# Patient Record
Sex: Male | Born: 1960 | Race: White | Hispanic: No | Marital: Married | Smoking: Former smoker
Health system: Southern US, Community
[De-identification: ages and names within clinical notes are randomized; demographics above are authoritative.]

## PROBLEM LIST (undated history)

## (undated) DIAGNOSIS — Z22322 Carrier or suspected carrier of Methicillin resistant Staphylococcus aureus: Secondary | ICD-10-CM

## (undated) DIAGNOSIS — I10 Essential (primary) hypertension: Secondary | ICD-10-CM

## (undated) DIAGNOSIS — E781 Pure hyperglyceridemia: Secondary | ICD-10-CM

## (undated) DIAGNOSIS — E1151 Type 2 diabetes mellitus with diabetic peripheral angiopathy without gangrene: Secondary | ICD-10-CM

## (undated) DIAGNOSIS — M1990 Unspecified osteoarthritis, unspecified site: Secondary | ICD-10-CM

## (undated) DIAGNOSIS — E119 Type 2 diabetes mellitus without complications: Secondary | ICD-10-CM

## (undated) DIAGNOSIS — I219 Acute myocardial infarction, unspecified: Secondary | ICD-10-CM

## (undated) DIAGNOSIS — F32A Depression, unspecified: Secondary | ICD-10-CM

## (undated) DIAGNOSIS — F419 Anxiety disorder, unspecified: Secondary | ICD-10-CM

## (undated) DIAGNOSIS — N400 Enlarged prostate without lower urinary tract symptoms: Secondary | ICD-10-CM

## (undated) DIAGNOSIS — I209 Angina pectoris, unspecified: Secondary | ICD-10-CM

## (undated) DIAGNOSIS — Z955 Presence of coronary angioplasty implant and graft: Secondary | ICD-10-CM

## (undated) DIAGNOSIS — F329 Major depressive disorder, single episode, unspecified: Secondary | ICD-10-CM

## (undated) DIAGNOSIS — I2510 Atherosclerotic heart disease of native coronary artery without angina pectoris: Principal | ICD-10-CM

## (undated) DIAGNOSIS — J189 Pneumonia, unspecified organism: Secondary | ICD-10-CM

## (undated) DIAGNOSIS — N200 Calculus of kidney: Secondary | ICD-10-CM

## (undated) DIAGNOSIS — N5201 Erectile dysfunction due to arterial insufficiency: Secondary | ICD-10-CM

## (undated) DIAGNOSIS — I70209 Unspecified atherosclerosis of native arteries of extremities, unspecified extremity: Secondary | ICD-10-CM

## (undated) DIAGNOSIS — Z87442 Personal history of urinary calculi: Secondary | ICD-10-CM

## (undated) DIAGNOSIS — E785 Hyperlipidemia, unspecified: Secondary | ICD-10-CM

## (undated) HISTORY — DX: Hyperlipidemia, unspecified: E78.5

## (undated) HISTORY — DX: Pure hyperglyceridemia: E78.1

## (undated) HISTORY — DX: Type 2 diabetes mellitus without complications: E11.9

## (undated) HISTORY — DX: Essential (primary) hypertension: I10

## (undated) HISTORY — DX: Acute myocardial infarction, unspecified: I21.9

## (undated) HISTORY — DX: Benign prostatic hyperplasia without lower urinary tract symptoms: N40.0

## (undated) HISTORY — DX: Unspecified atherosclerosis of native arteries of extremities, unspecified extremity: I70.209

## (undated) HISTORY — DX: Type 2 diabetes mellitus with diabetic peripheral angiopathy without gangrene: E11.51

## (undated) HISTORY — DX: Major depressive disorder, single episode, unspecified: F32.9

## (undated) HISTORY — DX: Depression, unspecified: F32.A

## (undated) HISTORY — DX: Erectile dysfunction due to arterial insufficiency: N52.01

## (undated) HISTORY — DX: Carrier or suspected carrier of methicillin resistant Staphylococcus aureus: Z22.322

## (undated) HISTORY — PX: CORONARY ANGIOPLASTY WITH STENT PLACEMENT: SHX49

---

## 1995-04-24 HISTORY — PX: CORONARY ANGIOPLASTY WITH STENT PLACEMENT: SHX49

## 1999-03-29 ENCOUNTER — Inpatient Hospital Stay (HOSPITAL_COMMUNITY): Admission: EM | Admit: 1999-03-29 | Discharge: 1999-03-31 | Payer: Self-pay | Admitting: Emergency Medicine

## 2003-07-08 ENCOUNTER — Ambulatory Visit (HOSPITAL_COMMUNITY): Admission: RE | Admit: 2003-07-08 | Discharge: 2003-07-09 | Payer: Self-pay | Admitting: Cardiology

## 2004-12-18 ENCOUNTER — Ambulatory Visit: Payer: Self-pay | Admitting: Cardiology

## 2004-12-22 ENCOUNTER — Ambulatory Visit: Payer: Self-pay | Admitting: Cardiology

## 2004-12-22 ENCOUNTER — Ambulatory Visit: Payer: Self-pay

## 2006-02-05 ENCOUNTER — Ambulatory Visit: Payer: Self-pay | Admitting: Cardiology

## 2006-02-14 ENCOUNTER — Ambulatory Visit: Payer: Self-pay | Admitting: Cardiology

## 2006-02-14 ENCOUNTER — Encounter: Payer: Self-pay | Admitting: Cardiology

## 2006-02-14 ENCOUNTER — Ambulatory Visit: Payer: Self-pay

## 2006-02-25 ENCOUNTER — Ambulatory Visit: Payer: Self-pay | Admitting: Cardiology

## 2007-02-14 ENCOUNTER — Ambulatory Visit: Payer: Self-pay | Admitting: Cardiology

## 2007-02-19 ENCOUNTER — Encounter: Payer: Self-pay | Admitting: Cardiology

## 2007-02-19 ENCOUNTER — Ambulatory Visit: Payer: Self-pay

## 2007-02-19 LAB — CONVERTED CEMR LAB
ALT: 30 units/L (ref 0–53)
Alkaline Phosphatase: 51 units/L (ref 39–117)
BUN: 11 mg/dL (ref 6–23)
Bilirubin, Direct: 0.1 mg/dL (ref 0.0–0.3)
CO2: 31 meq/L (ref 19–32)
Calcium: 9.3 mg/dL (ref 8.4–10.5)
GFR calc Af Amer: 116 mL/min
GFR calc non Af Amer: 96 mL/min
HDL: 30.7 mg/dL — ABNORMAL LOW (ref >39.0)
Potassium: 4.3 meq/L (ref 3.5–5.1)
Total CHOL/HDL Ratio: 4.8
Total Protein: 6.9 g/dL (ref 6.0–8.3)
Triglycerides: 321 mg/dL (ref 0–149)

## 2008-02-09 ENCOUNTER — Ambulatory Visit: Payer: Self-pay | Admitting: Cardiology

## 2008-02-09 LAB — CONVERTED CEMR LAB
ALT: 30 units/L (ref 0–53)
CO2: 28 meq/L (ref 19–32)
Calcium: 9.5 mg/dL (ref 8.4–10.5)
Cholesterol: 135 mg/dL (ref 0–200)
Creatinine, Ser: 0.9 mg/dL (ref 0.4–1.5)
Direct LDL: 70.9 mg/dL
GFR calc Af Amer: 115 mL/min
Glucose, Bld: 256 mg/dL — ABNORMAL HIGH (ref 70–99)
HDL: 31.2 mg/dL — ABNORMAL LOW (ref >39.0)
Hgb A1c MFr Bld: 7.7 % — ABNORMAL HIGH (ref 4.6–6.0)
Sodium: 136 meq/L (ref 135–145)
Total CHOL/HDL Ratio: 4.3
Total Protein: 7 g/dL (ref 6.0–8.3)
Triglycerides: 220 mg/dL (ref 0–149)

## 2008-08-19 ENCOUNTER — Ambulatory Visit: Payer: Self-pay | Admitting: Internal Medicine

## 2008-08-19 ENCOUNTER — Encounter (INDEPENDENT_AMBULATORY_CARE_PROVIDER_SITE_OTHER): Payer: Self-pay | Admitting: *Deleted

## 2008-08-19 DIAGNOSIS — I252 Old myocardial infarction: Secondary | ICD-10-CM | POA: Insufficient documentation

## 2008-08-19 DIAGNOSIS — I10 Essential (primary) hypertension: Secondary | ICD-10-CM | POA: Insufficient documentation

## 2008-08-19 DIAGNOSIS — F418 Other specified anxiety disorders: Secondary | ICD-10-CM

## 2008-08-19 DIAGNOSIS — E1151 Type 2 diabetes mellitus with diabetic peripheral angiopathy without gangrene: Secondary | ICD-10-CM | POA: Insufficient documentation

## 2008-08-19 DIAGNOSIS — N400 Enlarged prostate without lower urinary tract symptoms: Secondary | ICD-10-CM

## 2008-08-19 DIAGNOSIS — N138 Other obstructive and reflux uropathy: Secondary | ICD-10-CM | POA: Insufficient documentation

## 2008-08-19 DIAGNOSIS — I70209 Unspecified atherosclerosis of native arteries of extremities, unspecified extremity: Secondary | ICD-10-CM

## 2008-08-19 HISTORY — DX: Type 2 diabetes mellitus with diabetic peripheral angiopathy without gangrene: E11.51

## 2008-08-19 HISTORY — DX: Essential (primary) hypertension: I10

## 2008-08-19 LAB — CONVERTED CEMR LAB
ALT: 42 units/L (ref 0–53)
Albumin: 4.2 g/dL (ref 3.5–5.2)
Basophils Relative: 0.1 % (ref 0.0–3.0)
Bilirubin, Direct: 0.1 mg/dL (ref 0.0–0.3)
CO2: 31 meq/L (ref 19–32)
Chloride: 107 meq/L (ref 96–112)
Cholesterol, target level: 200 mg/dL
Creatinine, Ser: 0.7 mg/dL (ref 0.4–1.5)
Direct LDL: 59.9 mg/dL
Eosinophils Relative: 1.6 % (ref 0.0–5.0)
HCT: 40.1 % (ref 39.0–52.0)
Hemoglobin: 14.3 g/dL (ref 13.0–17.0)
Leukocytes, UA: NEGATIVE
MCV: 86.6 fL (ref 78.0–100.0)
Microalb Creat Ratio: 3.6 mg/g (ref 0.0–30.0)
Monocytes Absolute: 0.6 10*3/uL (ref 0.1–1.0)
Neutro Abs: 4.3 10*3/uL (ref 1.4–7.7)
Neutrophils Relative %: 63.4 % (ref 43.0–77.0)
PSA: 0.38 ng/mL (ref 0.10–4.00)
Potassium: 4.2 meq/L (ref 3.5–5.1)
RBC: 4.63 M/uL (ref 4.22–5.81)
Sodium: 142 meq/L (ref 135–145)
Specific Gravity, Urine: >=1.03 (ref 1.000–1.030)
Total CHOL/HDL Ratio: 4
Total CK: 75 units/L (ref 7–232)
Total Protein: 7.1 g/dL (ref 6.0–8.3)
Urine Glucose: 100 mg/dL
Urobilinogen, UA: 0.2 (ref 0.0–1.0)
VLDL: 51.2 mg/dL — ABNORMAL HIGH (ref 0.0–40.0)
WBC: 6.7 10*3/uL (ref 4.5–10.5)

## 2009-05-03 DIAGNOSIS — I2510 Atherosclerotic heart disease of native coronary artery without angina pectoris: Secondary | ICD-10-CM

## 2009-05-03 DIAGNOSIS — E781 Pure hyperglyceridemia: Secondary | ICD-10-CM | POA: Insufficient documentation

## 2009-05-03 HISTORY — DX: Atherosclerotic heart disease of native coronary artery without angina pectoris: I25.10

## 2009-05-03 HISTORY — DX: Pure hyperglyceridemia: E78.1

## 2009-05-10 ENCOUNTER — Ambulatory Visit: Payer: Self-pay | Admitting: Cardiology

## 2009-06-09 ENCOUNTER — Ambulatory Visit: Payer: Self-pay | Admitting: Internal Medicine

## 2009-07-04 ENCOUNTER — Encounter: Payer: Self-pay | Admitting: Internal Medicine

## 2010-05-25 NOTE — Letter (Signed)
Summary: Referral - not able to see patient  [facility]  [address] [place], [place] [address]   Phone: [phone]  Fax: [phone]    July 04, 2009    Sanda Linger, M.D. [address] [place], [place] [address]    Re:   Justin Galvan DOB:  11-07-1960 MRN:   [id number]    Dear Dr. Yetta Barre:  Thank you for your kind referral of the above patient.  We have attempted to schedule the recommended procedure Screening Colonoscopy but have not been able to schedule because:   X  The patient was not available by phone and/or has not returned our calls.  ___ The patient declined to schedule the procedure at this time.  We appreciate the referral and hope that we will have the opportunity to treat this patient in the future.    Sincerely,    [facility] [phone]

## 2010-05-25 NOTE — Assessment & Plan Note (Signed)
Summary: f1y/jss  Medications Added QUINAPRIL-HYDROCHLOROTHIAZIDE 20-12.5 MG TABS (QUINAPRIL-HYDROCHLOROTHIAZIDE) Take 1 tablet by mouth once a day PAROXETINE HCL 40 MG TABS (PAROXETINE HCL) Take 1 tablet by mouth once a day COREG CR 40 MG XR24H-CAP (CARVEDILOL PHOSPHATE) Take 1 tablet by mouth once a day VYTORIN 10-40 MG TABS (EZETIMIBE-SIMVASTATIN) Take 1 tablet by mouth once a day      Allergies Added: NKDA  Visit Type:  1 yr f/u  CC:  no cardiac complaints today.  History of Present Illness: Justin Galvan comes in today for followup of his coronary artery disease, history of an inferior posterior Gerron Guidotti infarct, hypertension, mixed hyperlipidemia, and now diabetes.  When I saw him last year, he has elevated hemoglobin A1c. Obtain a primary care doctor with Dr. Yetta Barre who he saw him but has not followed up with. At that time his lipids were goal except for low HDL and triglycerides of 256 creatinine electrolytes CBC and LFTs were normal, yet his hemoglobin A1c was 8.1 his fasting blood sugar was 214. Urinalysis was negative and there was no obvious protein excess in his urine.  He is not taking a hypoglycemic agent. He says his diet is not good. He is taking his other medications. He denies any angina or ischemic symptoms. He's had no dyspnea on exertion. Last stress Myoview was about 2 years ago which was normal.  Current Medications (verified): 1)  Quinapril-Hydrochlorothiazide 20-12.5 Mg Tabs (Quinapril-Hydrochlorothiazide) .... Take 1 Tablet By Mouth Once A Day 2)  Paroxetine Hcl 40 Mg Tabs (Paroxetine Hcl) .... Take 1 Tablet By Mouth Once A Day 3)  Coreg Cr 40 Mg Xr24h-Cap (Carvedilol Phosphate) .... Take 1 Tablet By Mouth Once A Day 4)  Vytorin 10-40 Mg Tabs (Ezetimibe-Simvastatin) .... Take 1 Tablet By Mouth Once A Day 5)  Bayer Contour Monitor W/device Kit (Blood Glucose Monitoring Suppl) .... Use Two Times A Day As Directed 6)  Bayer Contour Test  Strp (Glucose Blood) .... Use Two  Times A Day As Directed  Allergies (verified): No Known Drug Allergies  Past History:  Past Medical History: Last updated: 05/03/2009 CAD, NATIVE VESSEL (ICD-414.01) MYOCARDIAL INFARCTION, HX OF (ICD-412) HYPERLIPIDEMIA-MIXED (ICD-272.4) HYPERTENSION (ICD-401.9) DIABETES MELLITUS, TYPE II (ICD-250.00) BENIGN PROSTATIC HYPERTROPHY (ICD-600.00) DEPRESSION (ICD-311) ROUTINE GENERAL MEDICAL EXAM@HEALTH  CARE FACL (ICD-V70.0)    Past Surgical History: Last updated: 08/19/2008 PTCA/stent  Family History: Last updated: 08/19/2008 Family History Hypertension  Social History: Last updated: 08/19/2008 Occupation: Surveyor, minerals Married Alcohol use-yes Drug use-no Regular exercise-yes  Risk Factors: Alcohol Use: 1 (08/19/2008) >5 drinks/d w/in last 3 months: no (08/19/2008) Exercise: yes (08/19/2008)  Risk Factors: Smoking Status: quit (08/19/2008)  Review of Systems       negative other than history of present illness  Vital Signs:  Patient profile:   50 year old male Height:      68 inches Weight:      190 pounds BMI:     28.99 Pulse rate:   71 / minute Pulse rhythm:   irregular BP sitting:   150 / 90  (left arm) Cuff size:   large  Vitals Entered By: Danielle Rankin, CMA (May 10, 2009 4:38 PM)  Physical Exam  General:  looks older than stated age which is baseline, great sense Head:  normocephalic and atraumatic Eyes:  PERRLA/EOM intact; conjunctiva and lids normal. Mouth:  Teeth, gums and palate normal. Oral mucosa normal. Neck:  Neck supple, no JVD. No masses, thyromegaly or abnormal cervical nodes. Chest Delman Goshorn:  no deformities or breast masses  noted Lungs:  Clear bilaterally to auscultation and percussion. Heart:  regular rate and rhythm, normal S1-S2, no obvious murmur Msk:  Back normal, normal gait. Muscle strength and tone normal. Pulses:  pulses normal in all 4 extremities Extremities:  No clubbing or cyanosis. Neurologic:  Alert and oriented x  3. Skin:  Intact without lesions or rashes. Psych:  Normal affect.   EKG  Procedure date:  05/10/2009  Findings:      normal sinus rhythm, old inferior posterior MI pattern, no change  Impression & Recommendations:  Problem # 1:  CAD, NATIVE VESSEL (ICD-414.01) Assessment Unchanged  His updated medication list for this problem includes:    Quinapril-hydrochlorothiazide 20-12.5 Mg Tabs (Quinapril-hydrochlorothiazide) .Marland Kitchen... Take 1 tablet by mouth once a day    Coreg Cr 40 Mg Xr24h-cap (Carvedilol phosphate) .Marland Kitchen... Take 1 tablet by mouth once a day  Orders: EKG w/ Interpretation (93000)  His updated medication list for this problem includes:    Quinapril-hydrochlorothiazide 20-12.5 Mg Tabs (Quinapril-hydrochlorothiazide) .Marland Kitchen... Take 1 tablet by mouth once a day    Coreg Cr 40 Mg Xr24h-cap (Carvedilol phosphate) .Marland Kitchen... Take 1 tablet by mouth once a day  Problem # 2:  MYOCARDIAL INFARCTION, HX OF (ICD-412) Assessment: Unchanged  His updated medication list for this problem includes:    Quinapril-hydrochlorothiazide 20-12.5 Mg Tabs (Quinapril-hydrochlorothiazide) .Marland Kitchen... Take 1 tablet by mouth once a day    Coreg Cr 40 Mg Xr24h-cap (Carvedilol phosphate) .Marland Kitchen... Take 1 tablet by mouth once a day  His updated medication list for this problem includes:    Quinapril-hydrochlorothiazide 20-12.5 Mg Tabs (Quinapril-hydrochlorothiazide) .Marland Kitchen... Take 1 tablet by mouth once a day    Coreg Cr 40 Mg Xr24h-cap (Carvedilol phosphate) .Marland Kitchen... Take 1 tablet by mouth once a day  Problem # 3:  HYPERLIPIDEMIA-MIXED (ICD-272.4) Assessment: Improved  His updated medication list for this problem includes:    Vytorin 10-40 Mg Tabs (Ezetimibe-simvastatin) .Marland Kitchen... Take 1 tablet by mouth once a day  His updated medication list for this problem includes:    Vytorin 10-40 Mg Tabs (Ezetimibe-simvastatin) .Marland Kitchen... Take 1 tablet by mouth once a day  Problem # 4:  HYPERTENSION (ICD-401.9) Assessment: Unchanged  His  updated medication list for this problem includes:    Quinapril-hydrochlorothiazide 20-12.5 Mg Tabs (Quinapril-hydrochlorothiazide) .Marland Kitchen... Take 1 tablet by mouth once a day    Coreg Cr 40 Mg Xr24h-cap (Carvedilol phosphate) .Marland Kitchen... Take 1 tablet by mouth once a day  His updated medication list for this problem includes:    Quinapril-hydrochlorothiazide 20-12.5 Mg Tabs (Quinapril-hydrochlorothiazide) .Marland Kitchen... Take 1 tablet by mouth once a day    Coreg Cr 40 Mg Xr24h-cap (Carvedilol phosphate) .Marland Kitchen... Take 1 tablet by mouth once a day  Problem # 5:  DIABETES MELLITUS, TYPE II (ICD-250.00) Assessment: Deteriorated He is in a state of denial about this diagnosis. He has had 2 hemoglobin A1c is to have been elevated in the last year or so as well as a fasting blood sugar of 214. He says his diet is not good. Unfortunately he did not follow with Dr. Yetta Barre and is not taking any oral hypoglycemic agent. I will start metformin today at 500 mg b.i.d. and he promises to check his blood sugar and to follow Dr. Yetta Barre in the next 4-6 weeks. His updated medication list for this problem includes:    Quinapril-hydrochlorothiazide 20-12.5 Mg Tabs (Quinapril-hydrochlorothiazide) .Marland Kitchen... Take 1 tablet by mouth once a day  His updated medication list for this problem includes:    Quinapril-hydrochlorothiazide 20-12.5  Mg Tabs (Quinapril-hydrochlorothiazide) .Marland Kitchen... Take 1 tablet by mouth once a day  Patient Instructions: 1)  Your physician recommends that you schedule a follow-up appointment in: 6 MONTHS WITH DR Elanda Garmany DUE JULY 2011 2)  Your physician has recommended you make the following change in your medication: METFORMIN 500 MG TWICE DAILY  3)  You have been referred to PMD DR Sanda Linger 4-6 WEEKS 4)  CHECK BLOOD SUGAR AT LEAST 1 TIME DAILY  Prescriptions: VYTORIN 10-40 MG TABS (EZETIMIBE-SIMVASTATIN) Take 1 tablet by mouth once a day  #90 x 3   Entered by:   Danielle Rankin, CMA   Authorized by:   Gaylord Shih, MD, [facility]    Signed by:   Danielle Rankin, CMA on 05/10/2009   Method used:   Electronically to        [facility]* (mail-order)             ,          Ph: [phone]       Fax: [phone]   RxID:   [id number] PAROXETINE HCL 40 MG TABS (PAROXETINE HCL) Take 1 tablet by mouth once a day  #90 x 3   Entered by:   Danielle Rankin, CMA   Authorized by:   Gaylord Shih, MD, [facility]   Signed by:   Danielle Rankin, CMA on 05/10/2009   Method used:   Electronically to        [facility]* (mail-order)             ,          Ph: [phone]       Fax: [phone]   RxID:   [id number] COREG CR 40 MG XR24H-CAP (CARVEDILOL PHOSPHATE) Take 1 tablet by mouth once a day  #90 x 3   Entered by:   Danielle Rankin, CMA   Authorized by:   Gaylord Shih, MD, [facility]   Signed by:   Danielle Rankin, CMA on 05/10/2009   Method used:   Electronically to        [facility]* (mail-order)             ,          Ph: [phone]       Fax: [phone]   RxID:   [id number] QUINAPRIL-HYDROCHLOROTHIAZIDE 20-12.5 MG TABS (QUINAPRIL-HYDROCHLOROTHIAZIDE) Take 1 tablet by mouth once a day  #90 x 3   Entered by:   Danielle Rankin, CMA   Authorized by:   Gaylord Shih, MD, [facility]   Signed by:   Danielle Rankin, CMA on 05/10/2009   Method used:   Electronically to        [facility]* (mail-order)             ,          Ph: [phone]       Fax: [phone]   RxID:   [id number]

## 2010-08-18 ENCOUNTER — Other Ambulatory Visit: Payer: Self-pay | Admitting: *Deleted

## 2010-08-18 MED ORDER — QUINAPRIL-HYDROCHLOROTHIAZIDE 20-12.5 MG PO TABS: 1.0000 | ORAL_TABLET | Freq: Every day | ORAL | Status: DC

## 2010-08-18 MED ORDER — CARVEDILOL PHOSPHATE ER 40 MG PO CP24: 40.0000 mg | ORAL_CAPSULE | Freq: Every day | ORAL | Status: DC

## 2010-08-18 MED ORDER — EZETIMIBE-SIMVASTATIN 10-40 MG PO TABS: 1.0000 | ORAL_TABLET | Freq: Every day | ORAL | Status: DC

## 2010-08-22 ENCOUNTER — Telehealth: Payer: Self-pay | Admitting: Cardiology

## 2010-08-22 NOTE — Telephone Encounter (Signed)
Paroxetine 40 mg. [facility] [place]. [id number].

## 2010-08-23 ENCOUNTER — Telehealth: Payer: Self-pay | Admitting: Cardiology

## 2010-08-23 NOTE — Telephone Encounter (Signed)
paroxetine - [facility] [place] [phone].

## 2010-08-23 NOTE — Telephone Encounter (Signed)
[facility] prescription called in. Mylo Red RN

## 2010-08-23 NOTE — Telephone Encounter (Signed)
Pt needs script sent [facility] [place] for generic PAXIL

## 2010-08-24 ENCOUNTER — Other Ambulatory Visit: Payer: Self-pay | Admitting: *Deleted

## 2010-09-04 ENCOUNTER — Encounter: Payer: Self-pay | Admitting: Cardiology

## 2010-09-05 NOTE — Assessment & Plan Note (Signed)
[facility]                            CARDIOLOGY OFFICE NOTE   NAME:Justin Galvan, Justin Galvan                     MRN:          [id number]  DATE:02/14/2007                            DOB:          02-Oct-1960    Keyandre comes in today for further management of the following issues:  1. Coronary artery disease.  He has had a previous inferior wall      infarct eleven years ago.  His last stress Myoview in 2006 showed      an ejection fraction of 37% with inferior wall akinesia.  There was      no significant ischemia.  A 2D echo February 14, 2006, ejection      fraction 40% to 45%.  Inferior posterior wall hypokinesia, mild      left atrial enlargement, no significant mitral regurgitation.  2. Mixed hyperlipidemia.  3. Hypertension.   He is doing well with construction work.  He denies any orthopnea, PND,  any significant dyspnea on exertion.  He has had very little angina,  maybe one episode.   He is due blood work.   His medicines are:  1. Aspirin 325 a day.  2. Vytorin 10/40 daily.  3. Coreg sustained prep 40 mg a day.  4. Quinaretic 20/12.5 daily.  5. Paroxetine 40 mg a day.   His blood pressure is 138/94, his pulse is 60 and regular.  His weight  is 185, down 6.  HEENT:  Normocephalic, atraumatic.  PERRLA,  Extraocular movements  intact.  Sclerae clear.  Facial symmetry is normal.  Carotid upstrokes equal bilaterally without bruits.  Thyroid is not  enlarged, trachea is midline.  LUNGS:  Clear.  HEART:  Reveals a nondisplaced PMI.  There is no murmur, there is no  gallop.  ABDOMINAL EXAM:  Soft, good bowel sounds.  EXTREMITIES:  Reveal no edema.  Pulses are brisk.  NEURO EXAM:  Intact.  SKIN:  Unremarkable.   EKG shows normal sinus rhythm with previous inferior wall infarct and is  stable.   ASSESSMENT AND PLAN:  Cap is doing well.  He is doing exercise  rest/stress Myoview off of Coreg which we will schedule.  On that same  day we  will obtain a comprehensive  metabolic panel, lipid panel, and a hemoglobin A1c which was elevated  last year.  Decreased carbs were again emphasized.  I will see him back  in a year.     Thomas C. Daleen Squibb, MD, [facility]  Electronically Signed    TCW/MedQ  DD: 02/14/2007  DT: 02/15/2007  Job #: [id number]

## 2010-09-05 NOTE — Assessment & Plan Note (Signed)
 HEALTHC[facility]                            CARDIOLOGY OFFICE NOTE   NAME:Justin Galvan, Justin Galvan                     MRN:          [id number]  DATE:02/09/2008                            DOB:          31-Aug-1960    Justin Galvan comes in today for followup.  He is having no angina or  ischemic symptoms.  Unfortunately, he is a diabetic based on last year's  blood work.  I set him up to see Dr. Thomos Lemons, but he did not go.  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   He denies any orthopnea, PND, peripheral edema.   His problem list see, February 14, 2007, for this.  His stress Myoview  last year showed an EF of 41% with inferior wall hypokinesia, EF of 41%.  He had no significant ischemia.   MEDICATIONS:  1. Paroxetine 40 mg a day.  2. Quinapril/HCTZ 20/12.5 daily.  3. Aspirin 325 mg a day.  4. Vytorin 10/40 daily.  5. Coreg CR 40 mg a day.   PHYSICAL EXAMINATION:  VITAL SIGNS:  His blood pressure is 140/87, pulse  71 and regular.  His weight is 189.  HEENT:  Normal.  Carotid upstrokes are equal bilaterally without bruits,  no JVD.  Thyroid is not enlarged.  Trachea is midline.  LUNGS:  Clear to  auscultation.  HEART:  Regular rate and rhythm.  No gallop.  ABDOMINAL:  Soft, good bowel sounds.  There is no hepatomegaly.  No  midline bruit.  EXTREMITIES:  No cyanosis, clubbing, or edema.  Pulses are present.  NEURO:  Intact.  SKIN:  Unremarkable.   Electrocardiogram shows sinus rhythm, old inferior wall infarct.  No  change.   ASSESSMENT AND PLAN:  Shannen is stable from our standpoint.  We will  check comprehensive metabolic panel, lipids, and hemoglobin A1c.  I am  arranging to get him to establish with Dr. Santa Genera in our [place]  office.  He promises me he will go.  I will see him back again in a  year.  At that time he will need a stress Myoview.     Thomas C. Daleen Squibb, MD, [facility]  Electronically Signed    TCW/MedQ  DD: 02/09/2008  DT: 02/10/2008  Job #: [id number]

## 2010-09-08 ENCOUNTER — Ambulatory Visit (INDEPENDENT_AMBULATORY_CARE_PROVIDER_SITE_OTHER): Payer: 59 | Admitting: Cardiology

## 2010-09-08 ENCOUNTER — Encounter: Payer: Self-pay | Admitting: Cardiology

## 2010-09-08 VITALS — BP 132/90 | HR 66 | Resp 18 | Ht 68.0 in | Wt 180.8 lb

## 2010-09-08 DIAGNOSIS — I252 Old myocardial infarction: Secondary | ICD-10-CM

## 2010-09-08 DIAGNOSIS — E785 Hyperlipidemia, unspecified: Secondary | ICD-10-CM

## 2010-09-08 DIAGNOSIS — I2510 Atherosclerotic heart disease of native coronary artery without angina pectoris: Secondary | ICD-10-CM

## 2010-09-08 DIAGNOSIS — I10 Essential (primary) hypertension: Secondary | ICD-10-CM

## 2010-09-08 MED ORDER — ASPIRIN EC 81 MG PO TBEC: 81.0000 mg | DELAYED_RELEASE_TABLET | Freq: Every day | ORAL | Status: AC

## 2010-09-08 NOTE — Assessment & Plan Note (Signed)
Justin Galvan                              CARDIOLOGY OFFICE NOTE   NAME:Justin Galvan                     MRN:          [id number]  DATE:02/05/2006                            DOB:          May 05, 1960    Justin Galvan comes in today for management of the following issues:  1. Coronary artery disease.  He has a previous inferior wall infarct.  His      stress Myoview last year showed an EF of 37% with akinesia of the      inferior wall.  His EF has been better in the past.  He has no symptoms      of heart failure and has had no angina.  He is working Holiday representative.  2. Hypertension.  3. Mixed hyperlipidemia.   His medications are:  1. Paroxetine 40 mg a day.  2. Aspirin 325 a day.  3. Vytorin 10/40 daily.  4. Coreg 12.5 daily.   His lipids were at goal December 22, 2004, except for a low HDL of 27.5.  However, his triglycerides were now up at 251 and his fasting blood sugar  was at 130.  We recommended decreased carbs and weight.   His blood pressure today is 144/93.  He is usually up in the office.  His  heart rate is 89.  His EKG shows normal sinus rhythm with incomplete right  bundle, old inferior wall infarct which is stable.  His carotid upstrokes  are equal bilaterally without bruits, no JVD.  Thyroid is not enlarged,  trachea is midline.  Lungs are clear.  Heart reveals a regular rate and  rhythm without murmur.  Abdominal exam is soft with good bowel sounds.  No  midline bruit.  There is no hepatomegaly.  Extremities no cyanosis, clubbing  or edema.  Pulses are intact.  Neurologic exam is intact.   I had a long talk with Justin Galvan today.  He still only takes his Coreg once a  day.  His weight really has not dropped; in fact, it has gone up 5 pounds.   PLAN:  1. Medicines renewed.  2. Change Coreg to Coreg CR 40 once a day.  3. Check comprehensive metabolic panel, lipid panel, hemoglobin A1c.  4. A 2-D echocardiogram to assess left  ventricular function.  I suspect he      is better than in the high 30s.   Will plan on seeing him back in a year.       Thomas C. Daleen Squibb, MD, [facility]     TCW/MedQ  DD:  02/05/2006  DT:  02/06/2006  Job #:  [id number]

## 2010-09-08 NOTE — Cardiovascular Report (Signed)
[place]. [facility]  Patient:    Justin Galvan                      MRN: [id number] Proc. Date: 03/29/99 Adm. Date:  [id number] Attending:  Veneda Melter CC:         Veneda Melter, M.D. [facility]             Thomas C. Wall, M.D. [facility]                        Cardiac Catheterization  PROCEDURES PERFORMED: 1. Left heart catheterization. 2. Left ventriculogram. 3. Percutaneous transluminal coronary angioplasty and stenting of the mid    right coronary artery.  DIAGNOSES: 1. Single-vessel coronary artery disease. 2. Acute inferior wall myocardial infarction. 3. Mild left ventricular systolic function.  HISTORY OF PRESENT ILLNESS:  Mr. Justin Galvan is a 50 year old white male with known history of coronary artery disease who has suffered inferior wall myocardial infarction in the past.  He has also undergone multiple percutaneous interventions with placement of two stents in the right coronary artery.  He presents this morning with the acute onset of substernal chest discomfort while driving to work. The patient presented to the emergency room where he was found to have ST elevation of the inferior leads consistent with an acute inferior wall myocardial infarction. He is brought to the cardiac catheterization lab emergently for left heart catheterization.  TECHNIQUE:  After informed consent was obtained, the patient was brought to the  cardiac catheterization lab where both groins were sterilely prepped and draped. Then 1% lidocaine was used to infiltrate the right groin and 7 French sheath placed into the right femoral artery using the modified Seldinger technique.  The 6 [place] and JR4 catheters were then used to engage the left and right coronary arteries, and selective angiography performed in various projections using manual injections of contrast.  A 6 French pigtail catheter was then advanced to the left ventricle and the left ventriculogram was  performed using power injections of contrast.  FINDINGS:  Initially findings are as follows: 1. Left main:  The left main trunk is a large caliber vessel with mild    irregularities. 2. Left anterior descending:  This is a large caliber vessel that provides two    diagonal branches in its mid section.  The proximal LAD has a moderate diffuse    disease with a narrowing of 30-40% prior to the takeoff of the first diagonal    branch.  A focal narrowing of 30% is noted in the distal LAD after the second    diagonal branch.  The first diagonal branch is a medium caliber vessel with ild    irregularities.  The second diagonal branch is a larger vessel that bifurcates    in its mid section.  It also has mild irregularities.  The remainder of the AD    has mild diffuse disease. 3. Left circumflex artery:  This is a medication caliber vessel that consists of a    large marginal branch in the mid section.  The AV circumflex has mild diffuse    disease with narrowings 20-30%. 4. Ramus intermedius:  This is a small caliber vessel with mild irregularities.  5. Right coronary artery:  The right coronary artery is dominant and is a large  caliber vessel that provides the posterior descending artery as well as    posterior ventricular branch in its terminal  segment.  The right coronary artery    is occluded in the mid section with persistence of contrast noted consistent  with thrombus.  A previously placed stent in the proximal segment of the vessel  as well as in the mid section of the vessel prior to the occlusion are patent    with mild irregularities.  The distal segment of the vessel which is seen    later has mild irregularities as well.  LEFT VENTRICULOGRAM:  Normal end-systolic and end-diastolic dimensions. Overall, left ventricular function is mildly impaired with ejection fraction of approximately 40%.  Akinesis of the inferior wall is noted.  There is no  mitral  regurgitation.  LV pressure is 119/10, aortic is 120/77.  The LVEDP equals 20.  These findings were discussed with the patient and we proceeded with percutaneous intervention to the right coronary artery.  The 7 French sheath in the right femoral artery was exchanged over a wire for an 8 [place] sheath and a 5 French sheath placed in the right femoral vein using modified Seldinger technique. Heparin had previously been administered and this was supplemented to maintain CT of greater than 200 seconds.  Integrilin was also administered on a weight-adjusted basis in the double bolus fashion.  The patient was previously consented for enrollment in the X-TRACT trial and this device was prepared according to protocol.  An 8 [place] JR4 guide catheter with side holes was used to engage the right coronary artery and a 0.014 inch Hi-Torque Floppy wire advanced into the distal  RCA.  Repeat angiography showed recanalization of the vessel.  However, there was residual narrowing of greater than 90% with significant residual thrombus in the mid section of the RCA.  The X-TRACT catheter was then introduced and positioned just proximal to the thrombus and two passes made per protocol.  Repeat angiography showed an excellent result with significant reduction in the thrombus burden. There was perhaps 40-50% residual thrombus.  Two additional passes were made with repeat angiography showing less than 40% residual narrowing.  The Extract device was then removed and a 3.5 x 20 mm Adante balloon introduced.  Two inflations were made in the mid section of the RCA at 6 atmospheres for 60 seconds. Repeat angiography showed approximately 15-20% residual narrowing.  There was mild irregularities in this area of vessel.  However, flow had improved significantly from initial TIMI-0 to TIMI-3.  Due to the irregularities noted a 3.0 x 25 mm NIR with SOX stent was introduced and deployed in the mid  RCA.  This extended from  small area of aneurysmal dilatation in the mid section of the vessel to just prior  to the takeoff of the PDA.  This was deployed at 8 atmospheres for 60 seconds. A 3.0 x 22 mm Panora Ranger balloon was then introduced and used to post-dilate the stent. This was inflated in the distal segment of the stent at 12 atmospheres for 60 seconds and within the proximal segment of the stent at 14 atmospheres for 60 seconds.  Repeat angiography was then performed showing 0% residual narrowing and no evidence of distal vessel damage.  Final angiography was then performed in various projections confirming that there was TIMI-3 flow through the RCA and no vessel damage.  The guide catheter was then removed and the sheath secured into  position.  The patient tolerated the procedure well and had no chest pain at the end of the case and resolution of ST segment elevation.  He was then  transferred to the floor in stable condition.  The sheaths will be removed when the ACT returns to normal.  FINAL RESULTS: 1. Successful percutaneous transluminal coronary angioplasty and stenting of the    mid right coronary artery with reduction in 100% narrowing with thrombus to %    with improvement of TIMI-0 to TIMI-3 flow. 2. Utilization of the X-TRACT catheter for removal of thrombus. DD:  03/29/99 TD:  03/30/99 Job: [id number]

## 2010-09-08 NOTE — Discharge Summary (Signed)
NAME:  JAQUIN, COY                        ACCOUNT NO.:  [id number]   MEDICAL RECORD NO.:  [id number]                   PATIENT TYPE:  OIB   LOCATION:  6532                                 FACILITY:  [facility]   PHYSICIAN:  Learta Codding, M.D. [facility]             DATE OF BIRTH:  Jan 10, 1961   DATE OF ADMISSION:  07/08/2003  DATE OF DISCHARGE:  07/09/2003                           DISCHARGE SUMMARY - REFERRING   PROCEDURES:  Coronary angiogram/TAXUS stenting right coronary artery July 08, 2003.   REASON FOR ADMISSION:  Please refer to dictated admission note.   LABORATORY DATA:  Cardiac enzymes at discharge:  CPK 122/7.4 (6.1%),  159/14.9 (9.4%); troponin I 0.25, 0.67 (pre-intervention enzymes normal).  Hemoglobin A1C 6.2, normal electrolytes and renal function, normal CBC.   HOSPITAL COURSE:  Patient presented for elective coronary angiography, for  evaluation of recent symptoms suggestive of unstable angina pectoris, in the  context of known coronary artery disease status post multiple prior  percutaneous interventions.   Cardiac catheterization, performed by Dr. Gerri Spore (see report for full  details), revealed high-grade proximal RCA lesion with a 50-70% mid/distal  lesion as well with a question of dissection.  Both lesions were  successfully stented with TAXUS stents, by Dr. Gerri Spore, with no noted  complications, and restoration of TIMI-3 flow.   Previous placed stent sites (x3) of the RCA showed mild in-stent restenosis  (20-50%).  Residual anatomy notable for a 70% proximal LAD and 50% mid CFX.   LV gram:  EF 56% with moderate inferior hypokinesis and no valvular  abnormalities.   Patient was kept for overnight observation.  He reported no chest pain the  following morning.  Cardiac enzymes were, however, noted to be elevated and  reviewed with Dr. Valera Castle, who cleared the patient for discharge.   Medication adjustments this admission:  Plavix x9-12 months.   Of  note, patient was enrolled in the SEPIA trial and will be contacted by  the [facility].  Medications at discharge:  Plavix 75 mg once  daily (9-12 months), coated aspirin 325 mg once daily, Imdur 30 mg once  daily, Accuretic 20/12.5 mg, Vytorin 10/40 mg once daily, Coreg 12.5 mg  b.i.d., Paxil 40 mg once daily, Nitrostat 0.4 mg p.r.n.   Instructions:  No heavy lifting/driving x2 days; maintain a low  fat/cholesterol diet; call the office if there is any swelling/bleeding of  the groin or fever.   Patient is scheduled to follow up with Dr. Valera Castle on Monday, July 26, 2003 at 4:15 p.m.   DISCHARGE DIAGNOSES:  1. Coronary artery disease progression.     a. Status post TAXUS stenting right coronary artery (x2) with continued        patency of three previously placed right coronary artery stents.     b. Normal left ventricular function.     c. Post-intervention cardiac enzyme elevation.  d. Status post inferior myocardial infarction/stenting right coronary        artery and circumflex 1997; status post stenting mid right coronary        artery August 1997.  2. Hyperglycemia.     a. Question new onset diabetes mellitus.  3. Dyslipidemia.  4. Hypertension.  5. History of tobacco.      Gene Serpe, P.A. [facility]                      Learta Codding, M.D. [facility]    GS/MEDQ  D:  07/09/2003  T:  07/10/2003  Job:  [id number]

## 2010-09-08 NOTE — Cardiovascular Report (Signed)
NAMELYNDELL, ALLAIRE                        ACCOUNT NO.:  [id number]   MEDICAL RECORD NO.:  [id number]                   PATIENT TYPE:  OIB   LOCATION:  2899                                 FACILITY:  [facility]   PHYSICIAN:  Carole Binning, M.D. [facility]         DATE OF BIRTH:  [birth year]   DATE OF PROCEDURE:  07/08/2003  DATE OF DISCHARGE:                              CARDIAC CATHETERIZATION   PROCEDURES PERFORMED:  1. Left heart catheterization with,  2. Coronary angiography; and,  3. Left ventriculography.  4. Percutaneous transluminal coronary angioplasty with placement of a drug-     eluting stent in the proximal right coronary artery.  5. Percutaneous transluminal coronary angioplasty with placement of a drug-     eluting stent in the mid right coronary artery.   CARDIOLOGIST:  Carole Binning, M.D.   INDICATIONS:  Mr. Passe is a 50 year old male with an extensive history of  coronary artery disease.  He has undergone multiple previous percutaneous  coronary interventions including three stents in the right coronary artery;  one in the proximal vessel, one in the midvessel and one in the distal  vessel.  He presented to the office this week with symptoms of progressive  chest pain with minimal activity.  It was reminiscent of his prior anginal  symptoms.  He was therefore referred for cardiac catheterization.   CATHETERIZATION PROCEDURAL NOTE:  A 6 French sheath was placed in the right  femoral artery.  Coronary angiography was performed with standard Judkins 6  French catheters.  Left ventriculography was performed with an angled  pigtail catheter.   CONTRAST MATERIAL:  Contrast was Omnipaque.   COMPLICATIONS:  There were no complications.   RESULTS:   HEMODYNAMIC DATA:  Left ventricular pressure 106/80.  Aortic pressure  initially was 82/60; however, after intravenous fluids it improved to  112/86.  There is no aortic valve gradient.   VENTRICULOGRAPHIC DATA:   Left Ventriculogram:  There was moderate  hypokinesis of the inferior wall.  Ejection fraction was calculated at 56 %.  There was no mitral regurgitation.   ARTERIOGRAPHIC DATA:  Coronary Arteriography (Right Dominant)  Left Main:  The left main is normal.   Left Anterior Descending Artery:  The left anterior descending artery has a  tubular 70% stenosis in the midvessel across the origin over the first  diagonal branch.  The first diagonal branch itself is small in caliber and  has a 70% stenosis at its origin.  The distal LAD has a 40% stenosis.  There  is a normal-sized second diagonal branch, which arises from the mid LAD.  This has a 30% stenosis proximally.   Left Circumflex:  The left circumflex gives rise to a small first obtuse  marginal and a large second obtuse marginal branch.  There is a 50% stenosis  in the mid circumflex.   Right Coronary Artery:  The right coronary artery is a dominant vessel with  diffuse disease.  There is a stent in the proximal vessel with diffuse 30-  40% stenosis within the stent.  Just beyond the stent there is a 95%  stenosis with haziness and the appearance of a ruptured plaque.  Distal to  this in the mid right coronary artery there is a 25% stenosis.  There is a  stent in the mid right coronary artery with less than 20% stenosis within  the stent.  Beyond this stent in the distal portion of the mid right  coronary artery at the acute margin there is an area that appears to be  between 50 and 70% stenosis.  There is a stent in the distal vessel with a  diffuse 50% in-stent restenosis.  The distal right coronary artery gives  rise to a large posterior descending artery and a large posterolateral  branch.   IMPRESSION:  1. Mildly decreased left ventricular systolic function with inferior wall     motion abnormalities as described.  2. Two-vessel coronary artery disease.  The culprit lesion is the proximal     right coronary artery.  There is  moderate disease in the left anterior     descending artery, which does not appear to have changed significantly     compared to previous films from December 2000.  There is moderate, but     nonobstructive disease in the left circumflex.   PLAN:  Percutaneous intervention of the right coronary artery; see below.   PERCUTANEOUS TRANSLUMINAL CORONARY ANGIOPLASTY PROCEDURAL NOTE:  Following  completion of the diagnostic catheterization we proceeded with percutaneous  coronary intervention.  We utilized the preexisting 6 French sheath in the  right femoral artery. The patient was enrolled in the Sepia Trial and  treated per randomization protocol.  He was also treated with Integrilin.   We used a 6 [place] JR-4 guiding catheter.  An IQ coronary guidewire was  advanced under fluoroscopic guidance into the distal right coronary artery.  We then performed PTCA of the proximal right coronary artery with a 3.0 x 15  mm Quantum balloon, which was inflated to 10 atmospheres.  We then  positioned a 2.75 x 16 mm TAXUS drug-eluting stent across the diseased  segment of the proximal vessel such that the proximal portion of the stent  overlapped with the previously placed stent.  This stent was deployed at 11  atmospheres.  We then went back in with a 3.0 x 15 mm Quantum balloon and  positioned it within the newly placed stent and inflated it to 15  atmospheres.  We pulled this balloon more proximally and to the area of  overlap of the newly placed stent and the previous stent, and inflated it to  16 atmospheres.  We then performed two more inflations to 18 atmospheres  within the previously placed stent in the proximal vessel.   Angiographic images at that point revealed an excellent result at the stent  site with 0% residual stenosis and TIMI III flow.   We then turned our attention to the distal vessel.  The area within the previously placed stent in the distal vessel appeared to be slightly more   diseased at about 70% stenosis.  There was also a segment of vessel in the  distal portion of the right coronary artery, which was not stented and had  the appearance of a possible dissection that may have possibly been induced  by the coronary guidewire or may have been spontaneous.  We went back in  with our 3.0 x 15 mm Quantum balloon within the stent in the distal right  coronary artery, and performed three inflations to 16, 14 and eight  atmospheres.  We then positioned a 2.75 x 16 mm TAXUS stent in the distal  portion of the mid right coronary artery at the acute margin such that there  was minimal overlap of the stent with the two previously placed stents on  both the proximal and distal margins of this stent.  We deployed the TAXUS  stent at 18 atmospheres.   Intermittent doses of nitroglycerin were administered.  Final angiographic  images were obtained revealing patency of the right coronary artery with 0%  residual stenosis in the stent site in the midvessel and TIMI III flow.   At the conclusion of the procedure an Angio-Seal Vascular Closure Device was  placed in the right femoral artery with good hemostasis.   COMPLICATIONS:  None.   RESULTS:  1. Successful percutaneous coronary intervention with placement of a drug-     eluting stent in the proximal right coronary artery.  A 95% stenosis with     haziness and the appearance of a ruptured plaque was reduced to 0%     residual with TIMI III flow.  2. Successful placement of a drug-eluting stent in the mid-to-distal right     coronary artery.  A 70% stenosis with the appearance of a possible     dissection was reduced to 0% residual with TIMI III flow.   PLAN:  1. Integrilin will be continued for 18 hours.  2. It is recommended that the patient be treated with Plavix for six to nine     months.  3. It was also recommended that a stress nuclear scan be performed in four     to six weeks to assess the significance of any  residual coronary artery     disease.                                               Carole Binning, M.D. [facility]    MWP/MEDQ  D:  07/08/2003  T:  07/10/2003  Job:  [id number]   cc:   Thomas C. Wall, M.D.   Cardiac Catheterization Laboratory

## 2010-09-08 NOTE — Assessment & Plan Note (Signed)
Arrange FLP and LFTs. 

## 2010-09-08 NOTE — Discharge Summary (Signed)
[place]. [facility]  Patient:    Justin Galvan                      MRN: [id number] Adm. Date:  [id number] Disc. Date: 03/31/99 Attending:  Veneda Melter Dictator:   Leonides Cave, P.A. CC:         [name], M.D. [facility]; [place] Office x 1; [facility]                  Referring Physician Discharge [place]  DATE OF BIRTH:  [birth year].  ADMITTING PHYSICIAN:  Veneda Melter, M.D.  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  BRIEF HISTORY:  [age]-year-old male, with history of coronary artery disease, presented to the emergency department on March 29, 1999 with chest pain. Patient was driving on the morning of admission when he developed chest pain and called  EMS.  EKG in the emergency department was common with inferior MI, with ST elevation in II, III and AVF.  Patient was taken to the cath lab by Dr. Veneda Melter.  HOSPITAL COURSE:  In the cath lab on March 29, 1999, results from coronary angiography revealed left main with mild irregularities, LAD with a 40% proximal lesion before the first diagonal branch and 30% distally after the second diagonal. Left circumflex had a 20-30% lesion at the A-V circumflex.  There was a large obtuse marginal branch; intermediate was a small vessel.  The RCA was dominant nd there was 100% mid occlusion with thrombus seen.  The ejection fraction was 40%. Dr. Chales Abrahams proceeded to PTCA and stented the RCA with extraction catheter. Patient tolerated the procedure very well and was discharged home 48 hours after procedure in stable condition.  MEDICATIONS ON DISCHARGE: 1. Paxil 20 mg two tablets q.d. 2. Plavix 75 mg q.d. for one month. 3. Lipitor 20 mg q.d. 4. Coated aspirin q.d. 5. Nitroglycerin 0.5 mg p.r.n.  SPECIAL INSTRUCTIONS:  He was instructed to undergo no driving, sexual activity or strenuous activity for 48  hours.  He was taken off his Cardia XT for the time-being.  DIET:  He was told to adhere to a low-fat, low-cholesterol diet.  WOUND CARE:  He was told that if bleeding was seen at the catheterization site, he was to call the [facility] Cardiology Office immediately.  FOLLOWUP:  He will follow up with Dr. Juanito Doom on April 27, 1999 at 3:30 p.m. DD:  03/31/99 TD:  03/31/99 Job: [id number]

## 2010-09-08 NOTE — Progress Notes (Signed)
   Patient ID: Justin Galvan, male    DOB: 12/22/1960, 50 y.o.   MRN: [id number]  HPI Justin Galvan returns today for E and M of his CAD, hx of MI, HTN and MHL. He is having no angina.or ischemic equivalents. He is due lipids. He does not take ASA daily. Still active in construction work.  EKG today shows NSR with old IMI and IRBBB which is stable.   Review of Systems  All other systems reviewed and are negative.      Physical Exam  Nursing note and vitals reviewed. Constitutional: He is oriented to person, place, and time. He appears well-developed and well-nourished. No distress.       Ruddy complexion  HENT:  Head: Normocephalic and atraumatic.  Eyes: EOM are normal.  Neck: No JVD present. No tracheal deviation present. No thyromegaly present.  Cardiovascular: Normal rate, regular rhythm, normal heart sounds and intact distal pulses.  Exam reveals no gallop.   No murmur heard. Pulmonary/Chest: Effort normal.  Abdominal: Soft. Bowel sounds are normal.       No bruit  Musculoskeletal: Normal range of motion. He exhibits no edema.  Neurological: He is alert and oriented to person, place, and time.  Skin: Skin is dry.  Psychiatric: He has a normal mood and affect.

## 2010-09-08 NOTE — Patient Instructions (Signed)
Your physician recommends that you return for lab work in: Tuesday May 22 for fasting lab work Your physician recommends that you schedule a follow-up appointment in: 1 year with Dr. Daleen Squibb Your physician has recommended you make the following change in your medication: Aspirin 81 mg daily

## 2010-09-08 NOTE — Assessment & Plan Note (Signed)
Good. No change in treatment.

## 2010-09-08 NOTE — Assessment & Plan Note (Signed)
Stable, restart ASA daily.

## 2010-09-12 ENCOUNTER — Other Ambulatory Visit (INDEPENDENT_AMBULATORY_CARE_PROVIDER_SITE_OTHER): Payer: 59 | Admitting: *Deleted

## 2010-09-12 ENCOUNTER — Other Ambulatory Visit (INDEPENDENT_AMBULATORY_CARE_PROVIDER_SITE_OTHER): Payer: 59 | Admitting: Cardiology

## 2010-09-12 DIAGNOSIS — E785 Hyperlipidemia, unspecified: Secondary | ICD-10-CM

## 2010-09-12 DIAGNOSIS — Z13220 Encounter for screening for lipoid disorders: Secondary | ICD-10-CM

## 2010-09-12 LAB — HEPATIC FUNCTION PANEL
ALT: 37 U/L (ref 0–53)
AST: 29 U/L (ref 0–37)
Albumin: 4 g/dL (ref 3.5–5.2)
Total Bilirubin: 0.7 mg/dL (ref 0.3–1.2)

## 2010-09-12 LAB — LIPID PANEL
Cholesterol: 142 mg/dL (ref 0–200)
HDL: 38.8 mg/dL — ABNORMAL LOW (ref >39.00)
Triglycerides: 311 mg/dL — ABNORMAL HIGH (ref 0.0–149.0)

## 2010-09-12 LAB — LDL CHOLESTEROL, DIRECT: Direct LDL: 77.7 mg/dL

## 2010-09-13 ENCOUNTER — Telehealth: Payer: Self-pay | Admitting: *Deleted

## 2010-09-13 DIAGNOSIS — I252 Old myocardial infarction: Secondary | ICD-10-CM

## 2010-09-13 NOTE — Telephone Encounter (Signed)
LMOVM to call back about cholesterol results and to set-up appt for exercise stress myoview. Mylo Red RN

## 2010-09-19 NOTE — Telephone Encounter (Signed)
LMTCB Debbie Zeniah Briney RN  

## 2010-09-20 ENCOUNTER — Telehealth: Payer: Self-pay | Admitting: *Deleted

## 2010-09-20 NOTE — Telephone Encounter (Signed)
Cancel myoview order. Mylo Red RN

## 2010-09-20 NOTE — Telephone Encounter (Signed)
Pt aware of lab results.  He is feeling much better now and said he does not want to schedule an exercise myoview at this time. Mylo Red RN

## 2010-11-21 ENCOUNTER — Telehealth: Payer: Self-pay | Admitting: Cardiology

## 2010-11-21 NOTE — Telephone Encounter (Signed)
Pt needs pres faxed to [facility].  [phone].  Coreg 40 mg , Vytorin 20-40 mg/  Paxil 40 mg,  Accuretic 20-12.5,  90 day with 3 refills

## 2010-11-22 MED ORDER — CARVEDILOL PHOSPHATE ER 40 MG PO CP24: 40.0000 mg | ORAL_CAPSULE | Freq: Every day | ORAL | Status: DC

## 2010-11-22 MED ORDER — EZETIMIBE-SIMVASTATIN 10-40 MG PO TABS: 1.0000 | ORAL_TABLET | Freq: Every day | ORAL | Status: DC

## 2010-11-22 MED ORDER — QUINAPRIL-HYDROCHLOROTHIAZIDE 20-12.5 MG PO TABS: 1.0000 | ORAL_TABLET | Freq: Every day | ORAL | Status: DC

## 2010-11-22 NOTE — Telephone Encounter (Signed)
RX sent into pharmacy. Pt is to get paxil from primary care doctor. LMOM for pt.

## 2010-12-06 ENCOUNTER — Telehealth: Payer: Self-pay | Admitting: Cardiology

## 2010-12-06 MED ORDER — PAROXETINE HCL 40 MG PO TABS: 40.0000 mg | ORAL_TABLET | ORAL | Status: DC

## 2010-12-06 NOTE — Telephone Encounter (Signed)
Has a question as to why Paxil was not filled.  Please call him back regarding same.

## 2010-12-06 NOTE — Telephone Encounter (Signed)
Refill for Paxil sent to [facility]. Mylo Red RN

## 2011-04-10 ENCOUNTER — Other Ambulatory Visit (INDEPENDENT_AMBULATORY_CARE_PROVIDER_SITE_OTHER): Payer: 59

## 2011-04-10 ENCOUNTER — Other Ambulatory Visit: Payer: Self-pay | Admitting: Internal Medicine

## 2011-04-10 ENCOUNTER — Encounter: Payer: Self-pay | Admitting: Internal Medicine

## 2011-04-10 ENCOUNTER — Ambulatory Visit (INDEPENDENT_AMBULATORY_CARE_PROVIDER_SITE_OTHER): Payer: 59 | Admitting: Internal Medicine

## 2011-04-10 VITALS — BP 128/86 | HR 71 | Temp 98.3°F | Resp 16 | Wt 183.0 lb

## 2011-04-10 DIAGNOSIS — N400 Enlarged prostate without lower urinary tract symptoms: Secondary | ICD-10-CM

## 2011-04-10 DIAGNOSIS — Z23 Encounter for immunization: Secondary | ICD-10-CM

## 2011-04-10 DIAGNOSIS — Z0000 Encounter for general adult medical examination without abnormal findings: Secondary | ICD-10-CM | POA: Insufficient documentation

## 2011-04-10 DIAGNOSIS — E119 Type 2 diabetes mellitus without complications: Secondary | ICD-10-CM

## 2011-04-10 DIAGNOSIS — E785 Hyperlipidemia, unspecified: Secondary | ICD-10-CM

## 2011-04-10 DIAGNOSIS — I10 Essential (primary) hypertension: Secondary | ICD-10-CM

## 2011-04-10 LAB — COMPREHENSIVE METABOLIC PANEL
Albumin: 4.4 g/dL (ref 3.5–5.2)
Alkaline Phosphatase: 68 U/L (ref 39–117)
BUN: 16 mg/dL (ref 6–23)
Glucose, Bld: 273 mg/dL — ABNORMAL HIGH (ref 70–99)
Total Bilirubin: 0.9 mg/dL (ref 0.3–1.2)

## 2011-04-10 LAB — URINALYSIS, ROUTINE W REFLEX MICROSCOPIC
Ketones, ur: NEGATIVE
Leukocytes, UA: NEGATIVE
Specific Gravity, Urine: >=1.03 (ref 1.000–1.030)
Urobilinogen, UA: 0.2 (ref 0.0–1.0)

## 2011-04-10 LAB — CBC WITH DIFFERENTIAL/PLATELET
Basophils Absolute: 0 10*3/uL (ref 0.0–0.1)
Eosinophils Absolute: 0.1 10*3/uL (ref 0.0–0.7)
Lymphocytes Relative: 28.1 % (ref 12.0–46.0)
MCHC: 35.1 g/dL (ref 30.0–36.0)
MCV: 86.3 fl (ref 78.0–100.0)
Monocytes Absolute: 0.7 10*3/uL (ref 0.1–1.0)
Neutrophils Relative %: 60.7 % (ref 43.0–77.0)
Platelets: 230 10*3/uL (ref 150.0–400.0)
RBC: 5.12 Mil/uL (ref 4.22–5.81)
RDW: 12.8 % (ref 11.5–14.6)

## 2011-04-10 LAB — PSA: PSA: 0.43 ng/mL (ref 0.10–4.00)

## 2011-04-10 LAB — LIPID PANEL
HDL: 37.9 mg/dL — ABNORMAL LOW (ref >39.00)
Triglycerides: 425 mg/dL — ABNORMAL HIGH (ref 0.0–149.0)
VLDL: 85 mg/dL — ABNORMAL HIGH (ref 0.0–40.0)

## 2011-04-10 LAB — TSH: TSH: 1.77 u[IU]/mL (ref 0.35–5.50)

## 2011-04-10 LAB — GLUCOSE, POCT (MANUAL RESULT ENTRY): POC Glucose: 238

## 2011-04-10 LAB — HM DIABETES FOOT EXAM: HM Diabetic Foot Exam: NORMAL

## 2011-04-10 LAB — FECAL OCCULT BLOOD, GUAIAC: Fecal Occult Blood: NEGATIVE

## 2011-04-10 MED ORDER — SITAGLIP PHOS-METFORMIN HCL ER 100-1000 MG PO TB24: 1.0000 | ORAL_TABLET | Freq: Every day | ORAL | Status: DC

## 2011-04-10 NOTE — Assessment & Plan Note (Signed)
He is doing well on vytorin, I will check his labs today

## 2011-04-10 NOTE — Assessment & Plan Note (Addendum)
Exam done, labs ordered, vaccines updated, pt ed material was given, colonoscopy referral ordered

## 2011-04-10 NOTE — Progress Notes (Signed)
Subjective:    Patient ID: Justin Galvan, male    DOB: 1960-07-05, 50 y.o.   MRN: [id number]  Diabetes He presents for his follow-up diabetic visit. He has type 2 diabetes mellitus. No MedicAlert identification noted. His disease course has been worsening. Hypoglycemia symptoms include dizziness. Pertinent negatives for hypoglycemia include no headaches, pallor, seizures, speech difficulty or tremors. Associated symptoms include polydipsia, polyphagia and polyuria. Pertinent negatives for diabetes include no blurred vision, no chest pain, no fatigue, no foot paresthesias, no foot ulcerations, no visual change, no weakness and no weight loss. There are no hypoglycemic complications. Symptoms are stable. Diabetic complications include heart disease. When asked about current treatments, none were reported. His weight is stable. He is following a generally healthy diet. Meal planning includes avoidance of concentrated sweets. He has not had a previous visit with a dietician. He participates in exercise intermittently. An ACE inhibitor/angiotensin II receptor blocker is being taken. He does not see a podiatrist.Eye exam is not current.      Review of Systems  Constitutional: Negative for fever, chills, weight loss, diaphoresis, activity change, appetite change, fatigue and unexpected weight change.  HENT: Negative.   Eyes: Negative.  Negative for blurred vision.  Respiratory: Negative for cough, chest tightness, shortness of breath, wheezing and stridor.   Cardiovascular: Negative for chest pain, palpitations and leg swelling.  Gastrointestinal: Negative for nausea, vomiting, abdominal pain, diarrhea, constipation, blood in stool, abdominal distention, anal bleeding and rectal pain.  Genitourinary: Positive for polyuria and frequency. Negative for dysuria, urgency, hematuria, flank pain, decreased urine volume, discharge, penile swelling, scrotal swelling, enuresis, difficulty urinating, genital sores,  penile pain and testicular pain.  Musculoskeletal: Negative for myalgias, back pain, joint swelling, arthralgias and gait problem.  Skin: Negative for color change, pallor, rash and wound.  Neurological: Positive for dizziness. Negative for tremors, seizures, syncope, facial asymmetry, speech difficulty, weakness, light-headedness, numbness and headaches.  Hematological: Positive for polydipsia and polyphagia. Negative for adenopathy. Does not bruise/bleed easily.  Psychiatric/Behavioral: Negative.        Objective:   Physical Exam  Vitals reviewed. Constitutional: He is oriented to person, place, and time. He appears well-developed and well-nourished. No distress.  HENT:  Head: Normocephalic and atraumatic.  Mouth/Throat: Oropharynx is clear and moist. No oropharyngeal exudate.  Eyes: Conjunctivae are normal. Right eye exhibits no discharge. Left eye exhibits no discharge. No scleral icterus.  Neck: Normal range of motion. Neck supple. No JVD present. No tracheal deviation present. No thyromegaly present.  Cardiovascular: Normal rate, regular rhythm, normal heart sounds and intact distal pulses.  Exam reveals no gallop and no friction rub.   No murmur heard. Pulmonary/Chest: Effort normal and breath sounds normal. No stridor. No respiratory distress. He has no wheezes. He has no rales. He exhibits no tenderness.  Abdominal: Soft. Bowel sounds are normal. He exhibits no distension and no mass. There is no tenderness. There is no rebound and no guarding. Hernia confirmed negative in the right inguinal area and confirmed negative in the left inguinal area.  Genitourinary: Rectum normal, testes normal and penis normal. Rectal exam shows no external hemorrhoid, no internal hemorrhoid, no fissure, no mass, no tenderness and anal tone normal. Guaiac negative stool. Prostate is enlarged (1+ smooth bilateral hypertrophy). Prostate is not tender. Right testis shows no mass, no swelling and no  tenderness. Right testis is descended. Left testis shows no mass, no swelling and no tenderness. Left testis is descended. Uncircumcised. No penile tenderness. No discharge found.  Musculoskeletal: Normal range of motion. He exhibits no edema and no tenderness.  Lymphadenopathy:    He has no cervical adenopathy.       Right: No inguinal adenopathy present.       Left: No inguinal adenopathy present.  Neurological: He is oriented to person, place, and time.  Skin: Skin is warm and dry. No rash noted. He is not diaphoretic. No erythema. No pallor.  Psychiatric: He has a normal mood and affect. His behavior is normal. Judgment and thought content normal.      Lab Results  Component Value Date   WBC 6.7 08/19/2008   HGB 14.3 08/19/2008   HCT 40.1 08/19/2008   PLT 296.0 08/19/2008   GLUCOSE 214* 08/19/2008   CHOL 142 09/12/2010   TRIG 311.0* 09/12/2010   HDL 38.80* 09/12/2010   LDLDIRECT 77.7 09/12/2010   ALT 37 09/12/2010   AST 29 09/12/2010   NA 142 08/19/2008   K 4.2 08/19/2008   CL 107 08/19/2008   CREATININE 0.7 08/19/2008   BUN 15 08/19/2008   CO2 31 08/19/2008   TSH 1.73 08/19/2008   PSA 0.38 08/19/2008   HGBA1C 8.1* 08/19/2008   MICROALBUR 0.5 08/19/2008      Assessment & Plan:

## 2011-04-10 NOTE — Assessment & Plan Note (Signed)
His bs>200 today so I have started him on meds, I will check his a1c today and will monitor his renal function

## 2011-04-10 NOTE — Patient Instructions (Signed)
Diabetes, Type 2 Diabetes is a long-lasting (chronic) disease. In type 2 diabetes, the pancreas does not make enough insulin (a hormone), and the body does not respond normally to the insulin that is made. This type of diabetes was also previously called adult-onset diabetes. It usually occurs after the age of 40, but it can occur at any age.  CAUSES  Type 2 diabetes happens because the pancreasis not making enough insulin or your body has trouble using the insulin that your pancreas does make properly. SYMPTOMS   Drinking more than usual.   Urinating more than usual.   Blurred vision.   Dry, itchy skin.   Frequent infections.   Feeling more tired than usual (fatigue).  DIAGNOSIS The diagnosis of type 2 diabetes is usually made by one of the following tests:  Fasting blood glucose test. You will not eat for at least 8 hours and then take a blood test.   Random blood glucose test. Your blood glucose (sugar) is checked at any time of the day regardless of when you ate.   Oral glucose tolerance test (OGTT). Your blood glucose is measured after you have not eaten (fasted) and then after you drink a glucose containing beverage.  TREATMENT   Healthy eating.   Exercise.   Medicine, if needed.   Monitoring blood glucose.   Seeing your caregiver regularly.  HOME CARE INSTRUCTIONS   Check your blood glucose at least once a day. More frequent monitoring may be necessary, depending on your medicines and on how well your diabetes is controlled. Your caregiver will advise you.   Take your medicine as directed by your caregiver.   Do not smoke.   Make wise food choices. Ask your caregiver for information. Weight loss can improve your diabetes.   Learn about low blood glucose (hypoglycemia) and how to treat it.   Get your eyes checked regularly.   Have a yearly physical exam. Have your blood pressure checked and your blood and urine tested.   Wear a pendant or bracelet saying  that you have diabetes.   Check your feet every night for cuts, sores, blisters, and redness. Let your caregiver know if you have any problems.  SEEK MEDICAL CARE IF:   You have problems keeping your blood glucose in target range.   You have problems with your medicines.   You have symptoms of an illness that do not improve after 24 hours.   You have a sore or wound that is not healing.   You notice a change in vision or a new problem with your vision.   You have a fever.  MAKE SURE YOU:  Understand these instructions.   Will watch your condition.   Will get help right away if you are not doing well or get worse.  Document Released: 04/09/2005 Document Revised: 12/21/2010 Document Reviewed: 09/25/2010 [employer] Patient Information 2012 [employer].Health Maintenance, Males A healthy lifestyle and preventative care can promote health and wellness.  Maintain regular health, dental, and eye exams.   Eat a healthy diet. Foods like vegetables, fruits, whole grains, low-fat dairy products, and lean protein foods contain the nutrients you need without too many calories. Decrease your intake of foods high in solid fats, added sugars, and salt. Get information about a proper diet from your caregiver, if necessary.   Regular physical exercise is one of the most important things you can do for your health. Most adults should get at least 150 minutes of moderate-intensity exercise (any activity   that increases your heart rate and causes you to sweat) each week. In addition, most adults need muscle-strengthening exercises on 2 or more days a week.    Maintain a healthy weight. The body mass index (BMI) is a screening tool to identify possible weight problems. It provides an estimate of body fat based on height and weight. Your caregiver can help determine your BMI, and can help you achieve or maintain a healthy weight. For adults 20 years and older:   A BMI below 18.5 is considered  underweight.   A BMI of 18.5 to 24.9 is normal.   A BMI of 25 to 29.9 is considered overweight.   A BMI of 30 and above is considered obese.   Maintain normal blood lipids and cholesterol by exercising and minimizing your intake of saturated fat. Eat a balanced diet with plenty of fruits and vegetables. Blood tests for lipids and cholesterol should begin at age 20 and be repeated every 5 years. If your lipid or cholesterol levels are high, you are over 50, or you are a high risk for heart disease, you may need your cholesterol levels checked more frequently.Ongoing high lipid and cholesterol levels should be treated with medicines, if diet and exercise are not effective.   If you smoke, find out from your caregiver how to quit. If you do not use tobacco, do not start.   If you choose to drink alcohol, do not exceed 2 drinks per day. One drink is considered to be 12 ounces (355 mL) of beer, 5 ounces (148 mL) of wine, or 1.5 ounces (44 mL) of liquor.   Avoid use of street drugs. Do not share needles with anyone. Ask for help if you need support or instructions about stopping the use of drugs.   High blood pressure causes heart disease and increases the risk of stroke. Blood pressure should be checked at least every 1 to 2 years. Ongoing high blood pressure should be treated with medicines if weight loss and exercise are not effective.   If you are 45 to 52 years old, ask your caregiver if you should take aspirin to prevent heart disease.   Diabetes screening involves taking a blood sample to check your fasting blood sugar level. This should be done once every 3 years, after age 45, if you are within normal weight and without risk factors for diabetes. Testing should be considered at a younger age or be carried out more frequently if you are overweight and have at least 1 risk factor for diabetes.   Colorectal cancer can be detected and often prevented. Most routine colorectal cancer screening  begins at the age of 50 and continues through age 75. However, your caregiver may recommend screening at an earlier age if you have risk factors for colon cancer. On a yearly basis, your caregiver may provide home test kits to check for hidden blood in the stool. Use of a small camera at the end of a tube, to directly examine the colon (sigmoidoscopy or colonoscopy), can detect the earliest forms of colorectal cancer. Talk to your caregiver about this at age 50, when routine screening begins. Direct examination of the colon should be repeated every 5 to 10 years through age 75, unless early forms of pre-cancerous polyps or small growths are found.   Healthy men should no longer receive prostate-specific antigen (PSA) blood tests as part of routine cancer screening. Consult with your caregiver about prostate cancer screening.   Practice safe sex. Use   condoms and avoid high-risk sexual practices to reduce the spread of sexually transmitted infections (STIs).   Use sunscreen with a sun protection factor (SPF) of 30 or greater. Apply sunscreen liberally and repeatedly throughout the day. You should seek shade when your shadow is shorter than you. Protect yourself by wearing long sleeves, pants, a wide-brimmed hat, and sunglasses year round, whenever you are outdoors.   Notify your caregiver of new moles or changes in moles, especially if there is a change in shape or color. Also notify your caregiver if a mole is larger than the size of a pencil eraser.   A one-time screening for abdominal aortic aneurysm (AAA) and surgical repair of large AAAs by sound wave imaging (ultrasonography) is recommended for ages 65 to 75 years who are current or former smokers.   Stay current with your immunizations.  Document Released: 10/06/2007 Document Revised: 12/20/2010 Document Reviewed: 09/04/2010 [employer] Patient Information 2012 [employer]. 

## 2011-04-10 NOTE — Assessment & Plan Note (Signed)
His BP is well controlled, I will check his labs today

## 2011-05-16 ENCOUNTER — Encounter: Payer: Self-pay | Admitting: Internal Medicine

## 2011-05-16 ENCOUNTER — Ambulatory Visit (INDEPENDENT_AMBULATORY_CARE_PROVIDER_SITE_OTHER): Payer: 59 | Admitting: Internal Medicine

## 2011-05-16 DIAGNOSIS — E119 Type 2 diabetes mellitus without complications: Secondary | ICD-10-CM

## 2011-05-16 DIAGNOSIS — I10 Essential (primary) hypertension: Secondary | ICD-10-CM

## 2011-05-16 DIAGNOSIS — E785 Hyperlipidemia, unspecified: Secondary | ICD-10-CM

## 2011-05-16 MED ORDER — OMEGA-3-ACID ETHYL ESTERS 1 G PO CAPS: 2.0000 g | ORAL_CAPSULE | Freq: Two times a day (BID) | ORAL | Status: DC

## 2011-05-16 MED ORDER — SITAGLIP PHOS-METFORMIN HCL ER 100-1000 MG PO TB24: 1.0000 | ORAL_TABLET | Freq: Every day | ORAL | Status: DC

## 2011-05-16 NOTE — Patient Instructions (Signed)
Hypertriglyceridemia  Diet for High blood levels of Triglycerides Most fats in food are triglycerides. Triglycerides in your blood are stored as fat in your body. High levels of triglycerides in your blood may put you at a greater risk for heart disease and stroke.  Normal triglyceride levels are less than 150 mg/dL. Borderline high levels are 150-199 mg/dl. High levels are 200 - 499 mg/dL, and very high triglyceride levels are greater than 500 mg/dL. The decision to treat high triglycerides is generally based on the level. For people with borderline or high triglyceride levels, treatment includes weight loss and exercise. Drugs are recommended for people with very high triglyceride levels. Many people who need treatment for high triglyceride levels have metabolic syndrome. This syndrome is a collection of disorders that often include: insulin resistance, high blood pressure, blood clotting problems, high cholesterol and triglycerides. TESTING PROCEDURE FOR TRIGLYCERIDES  You should not eat 4 hours before getting your triglycerides measured. The normal range of triglycerides is between 10 and 250 milligrams per deciliter (mg/dl). Some people may have extreme levels (1000 or above), but your triglyceride level may be too high if it is above 150 mg/dl, depending on what other risk factors you have for heart disease.   People with high blood triglycerides may also have high blood cholesterol levels. If you have high blood cholesterol as well as high blood triglycerides, your risk for heart disease is probably greater than if you only had high triglycerides. High blood cholesterol is one of the main risk factors for heart disease.  CHANGING YOUR DIET  Your weight can affect your blood triglyceride level. If you are more than 20% above your ideal body weight, you may be able to lower your blood triglycerides by losing weight. Eating less and exercising regularly is the best way to combat this. Fat provides  more calories than any other food. The best way to lose weight is to eat less fat. Only 30% of your total calories should come from fat. Less than 7% of your diet should come from saturated fat. A diet low in fat and saturated fat is the same as a diet to decrease blood cholesterol. By eating a diet lower in fat, you may lose weight, lower your blood cholesterol, and lower your blood triglyceride level.  Eating a diet low in fat, especially saturated fat, may also help you lower your blood triglyceride level. Ask your dietitian to help you figure how much fat you can eat based on the number of calories your caregiver has prescribed for you.  Exercise, in addition to helping with weight loss may also help lower triglyceride levels.   Alcohol can increase blood triglycerides. You may need to stop drinking alcoholic beverages.   Too much carbohydrate in your diet may also increase your blood triglycerides. Some complex carbohydrates are necessary in your diet. These may include bread, rice, potatoes, other starchy vegetables and cereals.   Reduce "simple" carbohydrates. These may include pure sugars, candy, honey, and jelly without losing other nutrients. If you have the kind of high blood triglycerides that is affected by the amount of carbohydrates in your diet, you will need to eat less sugar and less high-sugar foods. Your caregiver can help you with this.   Adding 2-4 grams of fish oil (EPA+ DHA) may also help lower triglycerides. Speak with your caregiver before adding any supplements to your regimen.  Following the Diet  Maintain your ideal weight. Your caregivers can help you with a diet. Generally,   eating less food and getting more exercise will help you lose weight. Joining a weight control group may also help. Ask your caregivers for a good weight control group in your area.  Eat low-fat foods instead of high-fat foods. This can help you lose weight too.  These foods are lower in fat. Eat MORE  of these:   Dried beans, peas, and lentils.   Egg whites.   Low-fat cottage cheese.   Fish.   Lean cuts of meat, such as round, sirloin, rump, and flank (cut extra fat off meat you fix).   Whole grain breads, cereals and pasta.   Skim and nonfat dry milk.   Low-fat yogurt.   Poultry without the skin.   Cheese made with skim or part-skim milk, such as mozzarella, parmesan, farmers', ricotta, or pot cheese.  These are higher fat foods. Eat LESS of these:   Whole milk and foods made from whole milk, such as American, blue, cheddar, monterey jack, and swiss cheese   High-fat meats, such as luncheon meats, sausages, knockwurst, bratwurst, hot dogs, ribs, corned beef, ground pork, and regular ground beef.   Fried foods.  Limit saturated fats in your diet. Substituting unsaturated fat for saturated fat may decrease your blood triglyceride level. You will need to read package labels to know which products contain saturated fats.  These foods are high in saturated fat. Eat LESS of these:   Fried pork skins.   Whole milk.   Skin and fat from poultry.   Palm oil.   Butter.   Shortening.   Cream cheese.   Bacon.   Margarines and baked goods made from listed oils.   Vegetable shortenings.   Chitterlings.   Fat from meats.   Coconut oil.   Palm kernel oil.   Lard.   Cream.   Sour cream.   Fatback.   Coffee whiteners and non-dairy creamers made with these oils.   Cheese made from whole milk.  Use unsaturated fats (both polyunsaturated and monounsaturated) moderately. Remember, even though unsaturated fats are better than saturated fats; you still want a diet low in total fat.  These foods are high in unsaturated fat:   Canola oil.   Sunflower oil.   Mayonnaise.   Almonds.   Peanuts.   Pine nuts.   Margarines made with these oils.   Safflower oil.   Olive oil.   Avocados.   Cashews.   Peanut butter.   Sunflower seeds.   Soybean oil.     Peanut oil.   Olives.   Pecans.   Walnuts.   Pumpkin seeds.  Avoid sugar and other high-sugar foods. This will decrease carbohydrates without decreasing other nutrients. Sugar in your food goes rapidly to your blood. When there is excess sugar in your blood, your liver may use it to make more triglycerides. Sugar also contains calories without other important nutrients.  Eat LESS of these:   Sugar, brown sugar, powdered sugar, jam, jelly, preserves, honey, syrup, molasses, pies, candy, cakes, cookies, frosting, pastries, colas, soft drinks, punches, fruit drinks, and regular gelatin.   Avoid alcohol. Alcohol, even more than sugar, may increase blood triglycerides. In addition, alcohol is high in calories and low in nutrients. Ask for sparkling water, or a diet soft drink instead of an alcoholic beverage.  Suggestions for planning and preparing meals   Bake, broil, grill or roast meats instead of frying.   Remove fat from meats and skin from poultry before cooking.   Add spices,   herbs, lemon juice or vinegar to vegetables instead of salt, rich sauces or gravies.   Use a non-stick skillet without fat or use no-stick sprays.   Cool and refrigerate stews and broth. Then remove the hardened fat floating on the surface before serving.   Refrigerate meat drippings and skim off fat to make low-fat gravies.   Serve more fish.   Use less butter, margarine and other high-fat spreads on bread or vegetables.   Use skim or reconstituted non-fat dry milk for cooking.   Cook with low-fat cheeses.   Substitute low-fat yogurt or cottage cheese for all or part of the sour cream in recipes for sauces, dips or congealed salads.   Use half yogurt/half mayonnaise in salad recipes.   Substitute evaporated skim milk for cream. Evaporated skim milk or reconstituted non-fat dry milk can be whipped and substituted for whipped cream in certain recipes.   Choose fresh fruits for dessert instead of  high-fat foods such as pies or cakes. Fruits are naturally low in fat.  When Dining Out   Order low-fat appetizers such as fruit or vegetable juice, pasta with vegetables or tomato sauce.   Select clear, rather than cream soups.   Ask that dressings and gravies be served on the side. Then use less of them.   Order foods that are baked, broiled, poached, steamed, stir-fried, or roasted.   Ask for margarine instead of butter, and use only a small amount.   Drink sparkling water, unsweetened tea or coffee, or diet soft drinks instead of alcohol or other sweet beverages.  QUESTIONS AND ANSWERS ABOUT OTHER FATS IN THE BLOOD: SATURATED FAT, TRANS FAT, AND CHOLESTEROL What is trans fat? Trans fat is a type of fat that is formed when vegetable oil is hardened through a process called hydrogenation. This process helps makes foods more solid, gives them shape, and prolongs their shelf life. Trans fats are also called hydrogenated or partially hydrogenated oils.  What do saturated fat, trans fat, and cholesterol in foods have to do with heart disease? Saturated fat, trans fat, and cholesterol in the diet all raise the level of LDL "bad" cholesterol in the blood. The higher the LDL cholesterol, the greater the risk for coronary heart disease (CHD). Saturated fat and trans fat raise LDL similarly.  What foods contain saturated fat, trans fat, and cholesterol? High amounts of saturated fat are found in animal products, such as fatty cuts of meat, chicken skin, and full-fat dairy products like butter, whole milk, cream, and cheese, and in tropical vegetable oils such as palm, palm kernel, and coconut oil. Trans fat is found in some of the same foods as saturated fat, such as vegetable shortening, some margarines (especially hard or stick margarine), crackers, cookies, baked goods, fried foods, salad dressings, and other processed foods made with partially hydrogenated vegetable oils. Small amounts of trans fat  also occur naturally in some animal products, such as milk products, beef, and lamb. Foods high in cholesterol include liver, other organ meats, egg yolks, shrimp, and full-fat dairy products. How can I use the new food label to make heart-healthy food choices? Check the Nutrition Facts panel of the food label. Choose foods lower in saturated fat, trans fat, and cholesterol. For saturated fat and cholesterol, you can also use the Percent Daily Value (%DV): 5% DV or less is low, and 20% DV or more is high. (There is no %DV for trans fat.) Use the Nutrition Facts panel to choose foods low in   saturated fat and cholesterol, and if the trans fat is not listed, read the ingredients and limit products that list shortening or hydrogenated or partially hydrogenated vegetable oil, which tend to be high in trans fat. POINTS TO REMEMBER: YOU NEED A LITTLE TLC (THERAPEUTIC LIFESTYLE CHANGES)  Discuss your risk for heart disease with your caregivers, and take steps to reduce risk factors.   Change your diet. Choose foods that are low in saturated fat, trans fat, and cholesterol.   Add exercise to your daily routine if it is not already being done. Participate in physical activity of moderate intensity, like brisk walking, for at least 30 minutes on most, and preferably all days of the week. No time? Break the 30 minutes into three, 10-minute segments during the day.   Stop smoking. If you do smoke, contact your caregiver to discuss ways in which they can help you quit.   Do not use street drugs.   Maintain a normal weight.   Maintain a healthy blood pressure.   Keep up with your blood work for checking the fats in your blood as directed by your caregiver.  Document Released: 01/26/2004 Document Revised: 12/20/2010 Document Reviewed: 08/23/2008 [employer] Patient Information 2012 [employer].Diabetes, Type 2 Diabetes is a long-lasting (chronic) disease. In type 2 diabetes, the pancreas does not make enough  insulin (a hormone), and the body does not respond normally to the insulin that is made. This type of diabetes was also previously called adult-onset diabetes. It usually occurs after the age of 40, but it can occur at any age.  CAUSES  Type 2 diabetes happens because the pancreasis not making enough insulin or your body has trouble using the insulin that your pancreas does make properly. SYMPTOMS   Drinking more than usual.   Urinating more than usual.   Blurred vision.   Dry, itchy skin.   Frequent infections.   Feeling more tired than usual (fatigue).  DIAGNOSIS The diagnosis of type 2 diabetes is usually made by one of the following tests:  Fasting blood glucose test. You will not eat for at least 8 hours and then take a blood test.   Random blood glucose test. Your blood glucose (sugar) is checked at any time of the day regardless of when you ate.   Oral glucose tolerance test (OGTT). Your blood glucose is measured after you have not eaten (fasted) and then after you drink a glucose containing beverage.  TREATMENT   Healthy eating.   Exercise.   Medicine, if needed.   Monitoring blood glucose.   Seeing your caregiver regularly.  HOME CARE INSTRUCTIONS   Check your blood glucose at least once a day. More frequent monitoring may be necessary, depending on your medicines and on how well your diabetes is controlled. Your caregiver will advise you.   Take your medicine as directed by your caregiver.   Do not smoke.   Make wise food choices. Ask your caregiver for information. Weight loss can improve your diabetes.   Learn about low blood glucose (hypoglycemia) and how to treat it.   Get your eyes checked regularly.   Have a yearly physical exam. Have your blood pressure checked and your blood and urine tested.   Wear a pendant or bracelet saying that you have diabetes.   Check your feet every night for cuts, sores, blisters, and redness. Let your caregiver know  if you have any problems.  SEEK MEDICAL CARE IF:   You have problems keeping your blood   glucose in target range.   You have problems with your medicines.   You have symptoms of an illness that do not improve after 24 hours.   You have a sore or wound that is not healing.   You notice a change in vision or a new problem with your vision.   You have a fever.  MAKE SURE YOU:  Understand these instructions.   Will watch your condition.   Will get help right away if you are not doing well or get worse.  Document Released: 04/09/2005 Document Revised: 12/21/2010 Document Reviewed: 09/25/2010 [employer] Patient Information 2012 [employer]. 

## 2011-05-16 NOTE — Assessment & Plan Note (Signed)
Continue current meds and he will improve lifestyle modifications (nutrition referral)

## 2011-05-16 NOTE — Assessment & Plan Note (Signed)
His BP is well controlled 

## 2011-05-16 NOTE — Assessment & Plan Note (Signed)
Trigs are high so I have asked him to start lovaza and continue vytorin

## 2011-05-16 NOTE — Progress Notes (Signed)
Addended by: Etta Grandchild on: 05/16/2011 09:59 AM   Modules accepted: Orders

## 2011-05-16 NOTE — Progress Notes (Signed)
Subjective:    Patient ID: Justin Galvan, male    DOB: Oct 21, 1960, 51 y.o.   MRN: [id number]  Diabetes He presents for his follow-up diabetic visit. He has type 2 diabetes mellitus. His disease course has been worsening. There are no hypoglycemic associated symptoms. Pertinent negatives for hypoglycemia include no dizziness, headaches, pallor, seizures, speech difficulty or tremors. Pertinent negatives for diabetes include no blurred vision, no chest pain, no fatigue, no foot paresthesias, no foot ulcerations, no polydipsia, no polyphagia, no polyuria, no visual change, no weakness and no weight loss. There are no hypoglycemic complications. Symptoms are stable. Diabetic complications include heart disease. Current diabetic treatment includes oral agent (dual therapy). He is compliant with treatment all of the time. His weight is increasing steadily. He is following a generally unhealthy diet. When asked about meal planning, he reported none. He has not had a previous visit with a dietician. He participates in exercise intermittently. There is no change in his home blood glucose trend. An ACE inhibitor/angiotensin II receptor blocker is being taken. He does not see a podiatrist.Eye exam is current.  Hyperlipidemia This is a chronic problem. The current episode started more than 1 year ago. The problem is uncontrolled. Recent lipid tests were reviewed and are variable. Exacerbating diseases include obesity. He has no history of chronic renal disease, diabetes, hypothyroidism, liver disease or nephrotic syndrome. Factors aggravating his hyperlipidemia include fatty foods. Pertinent negatives include no chest pain, focal sensory loss, focal weakness, leg pain, myalgias or shortness of breath. Current antihyperlipidemic treatment includes statins and ezetimibe. The current treatment provides moderate improvement of lipids. Compliance problems include adherence to exercise and adherence to diet.        Review of Systems  Constitutional: Negative for fever, chills, weight loss, diaphoresis, activity change, appetite change, fatigue and unexpected weight change.  HENT: Negative.   Eyes: Negative.  Negative for blurred vision.  Respiratory: Negative for cough, choking, chest tightness, shortness of breath and stridor.   Cardiovascular: Negative for chest pain, palpitations and leg swelling.  Gastrointestinal: Negative for nausea, abdominal pain, diarrhea, constipation, blood in stool and rectal pain.  Genitourinary: Negative.  Negative for polyuria.  Musculoskeletal: Negative for myalgias, back pain, joint swelling, arthralgias and gait problem.  Skin: Negative for color change, pallor, rash and wound.  Neurological: Negative for dizziness, tremors, focal weakness, seizures, syncope, facial asymmetry, speech difficulty, weakness, light-headedness, numbness and headaches.  Hematological: Negative for polydipsia, polyphagia and adenopathy. Does not bruise/bleed easily.  Psychiatric/Behavioral: Negative.        Objective:   Physical Exam  Vitals reviewed. Constitutional: He is oriented to person, place, and time. He appears well-developed and well-nourished. No distress.  HENT:  Head: Normocephalic and atraumatic.  Mouth/Throat: Oropharynx is clear and moist. No oropharyngeal exudate.  Eyes: Conjunctivae are normal. Right eye exhibits no discharge. Left eye exhibits no discharge. No scleral icterus.  Neck: Normal range of motion. Neck supple. No JVD present. No tracheal deviation present. No thyromegaly present.  Cardiovascular: Normal rate, regular rhythm, normal heart sounds and intact distal pulses.  Exam reveals no gallop and no friction rub.   No murmur heard. Pulmonary/Chest: Effort normal and breath sounds normal. No stridor. No respiratory distress. He has no wheezes. He has no rales. He exhibits no tenderness.  Abdominal: Soft. Bowel sounds are normal. He exhibits no  distension and no mass. There is no tenderness. There is no rebound and no guarding.  Musculoskeletal: Normal range of motion. He exhibits no edema  and no tenderness.  Lymphadenopathy:    He has no cervical adenopathy.  Neurological: He is oriented to person, place, and time.  Skin: Skin is warm and dry. No rash noted. He is not diaphoretic. No erythema. No pallor.  Psychiatric: He has a normal mood and affect. His behavior is normal. Judgment and thought content normal.      Lab Results  Component Value Date   WBC 7.2 04/10/2011   HGB 15.5 04/10/2011   HCT 44.2 04/10/2011   PLT 230.0 04/10/2011   GLUCOSE 273* 04/10/2011   CHOL 169 04/10/2011   TRIG 425.0* 04/10/2011   HDL 37.90* 04/10/2011   LDLDIRECT 73.6 04/10/2011   ALT 30 04/10/2011   AST 22 04/10/2011   NA 137 04/10/2011   K 3.9 04/10/2011   CL 99 04/10/2011   CREATININE 0.7 04/10/2011   BUN 16 04/10/2011   CO2 27 04/10/2011   TSH 1.77 04/10/2011   PSA 0.43 04/10/2011   HGBA1C 10.6* 04/10/2011   MICROALBUR 0.5 08/19/2008      Assessment & Plan:

## 2011-06-14 ENCOUNTER — Encounter: Payer: Self-pay | Admitting: Gastroenterology

## 2011-11-05 ENCOUNTER — Telehealth: Payer: Self-pay | Admitting: Cardiology

## 2011-12-11 ENCOUNTER — Telehealth: Payer: Self-pay | Admitting: Cardiology

## 2011-12-11 MED ORDER — PAROXETINE HCL 40 MG PO TABS: 40.0000 mg | ORAL_TABLET | ORAL | Status: DC

## 2011-12-11 MED ORDER — CARVEDILOL PHOSPHATE ER 40 MG PO CP24: 40.0000 mg | ORAL_CAPSULE | Freq: Every day | ORAL | Status: DC

## 2011-12-11 MED ORDER — QUINAPRIL-HYDROCHLOROTHIAZIDE 20-12.5 MG PO TABS: 1.0000 | ORAL_TABLET | Freq: Every day | ORAL | Status: DC

## 2011-12-11 MED ORDER — EZETIMIBE-SIMVASTATIN 10-40 MG PO TABS: 1.0000 | ORAL_TABLET | Freq: Every day | ORAL | Status: DC

## 2011-12-11 NOTE — Telephone Encounter (Signed)
Pt needs refill of  Quinapril, vytorin, coreg, and paroxetine uses [facility] in  [place]

## 2011-12-26 ENCOUNTER — Ambulatory Visit (INDEPENDENT_AMBULATORY_CARE_PROVIDER_SITE_OTHER): Payer: 59 | Admitting: Cardiology

## 2011-12-26 ENCOUNTER — Encounter: Payer: Self-pay | Admitting: Cardiology

## 2011-12-26 VITALS — BP 172/80 | HR 74 | Ht 67.0 in | Wt 184.4 lb

## 2011-12-26 DIAGNOSIS — E119 Type 2 diabetes mellitus without complications: Secondary | ICD-10-CM

## 2011-12-26 DIAGNOSIS — E785 Hyperlipidemia, unspecified: Secondary | ICD-10-CM

## 2011-12-26 DIAGNOSIS — I2510 Atherosclerotic heart disease of native coronary artery without angina pectoris: Secondary | ICD-10-CM

## 2011-12-26 DIAGNOSIS — I252 Old myocardial infarction: Secondary | ICD-10-CM

## 2011-12-26 DIAGNOSIS — I10 Essential (primary) hypertension: Secondary | ICD-10-CM

## 2011-12-26 MED ORDER — QUINAPRIL-HYDROCHLOROTHIAZIDE 20-12.5 MG PO TABS: 2.0000 | ORAL_TABLET | Freq: Every day | ORAL | Status: DC

## 2011-12-26 NOTE — Patient Instructions (Addendum)
Your physician wants you to follow-up in: 12 months with Dr. Daleen Squibb. You will receive a reminder letter in the mail two months in advance. If you don't receive a letter, please call our office to schedule the follow-up appointment.  Your physician has recommended you make the following change in your medication: Increase your Quinapril-hctz to 2 tablets daily  Your physician has requested that you regularly monitor and record your blood pressure readings at home. Please use the same machine at the same time of day to check your readings and record them to bring to your follow-up visit. Goal blood pressure is less than 140/90   Basic Carbohydrate Counting Basic carbohydrate counting is a way to plan meals. It is done by counting the amount of carbohydrate in foods. Eating carbohydrates increases blood glucose (sugar) levels. People with diabetes use carbohydrate counting to help keep their blood glucose at a normal level.  Foods that have carbohydrates are starches (grains, beans, starchy vegetables) and sweets.  COUNTING CARBOHYDRATES IN FOODS The first step in counting carbohydrates is to learn how many carbohydrate servings you should have in every meal. A dietitian can plan this for you. After learning the amount of carbohydrates to include in your meal plan, you can start to choose the carbohydrate-containing foods you want to eat.  There are 2 ways to identify the amount of carbohydrates in the foods you eat.  Read the Nutrition Facts panel on food labels. All you need are 2 pieces of information from the Nutrition Facts panel to count carbohydrates this way:   Serving size.   Total carbohydrate (in grams).  Decide how many servings you will be eating. If it is 1 serving, you will be eating the amount of carbohydrate listed on the panel. If you will be eating 2 servings, you will be eating double the amount of carbohydrate listed on the panel.   Learn serving sizes. A serving size of most  carbohydrate-containing foods is about 15 g. Listed below are serving sizes of common carbohydrate-containing foods:   1 slice bread.    cup unsweetened, dry cereal.    cup hot cereal.   ? cup rice.    cup mashed potatoes.   ? cup pasta.   1 cup fresh fruit.    cup canned fruit.   1 cup milk (whole, 2%, or skim).    cup starchy vegetables (peas, corn, or potatoes).  Counting carbohydrates this way is similar to looking on the Nutrition Facts panel. Decide how many servings you will eat first. Multiply the number of servings you eat by 15 g. For example, if you have 2 cups of strawberries, you had 2 servings. That means you had 30 g of carbohydrate (2 servings x 15 g = 30 g). CALCULATING CARBOHYDRATES IN A MEAL Sample dinner  3 oz chicken breast.   ? cup brown rice.    cup corn.   1 cup fat-free milk.   1 cup strawberries with sugar-free whipped topping.  Carbohydrate calculation First, identify the foods that contain carbohydrate:  Rice.   Corn.   Milk.   Strawberries.  Calculate the number of servings eaten:  2 servings rice.   1 serving corn.   1 serving milk.   1 serving strawberries.  Multiply the number of servings by 15 g:  2 servings rice x 15 g = 30 g.   1 serving corn x 15 g = 15 g.   1 serving milk x 15 g = 15  g.   1 serving strawberries x 15 g = 15 g.  Add the amounts to find the total carbohydrates eaten: 30 g + 15 g + 15 g + 15 g = 75 g carbohydrate eaten at dinner. Document Released: 04/09/2005 Document Revised: 03/29/2011 Document Reviewed: 02/23/2011 [facility] Patient Information 2012 [place], [place]  .2 Gram Low Sodium Diet A 2 gram sodium diet restricts the amount of sodium in the diet to no more than 2 g or 2000 mg daily. Limiting the amount of sodium is often used to help lower blood pressure. It is important if you have heart, liver, or kidney problems. Many foods contain sodium for flavor and sometimes as a preservative.  When the amount of sodium in a diet needs to be low, it is important to know what to look for when choosing foods and drinks. The following includes some information and guidelines to help make it easier for you to adapt to a low sodium diet. QUICK TIPS  Do not add salt to food.   Avoid convenience items and fast food.   Choose unsalted snack foods.   Buy lower sodium products, often labeled as "lower sodium" or "no salt added."   Check food labels to learn how much sodium is in 1 serving.   When eating at a restaurant, ask that your food be prepared with less salt or none, if possible.  READING FOOD LABELS FOR SODIUM INFORMATION The nutrition facts label is a good place to find how much sodium is in foods. Look for products with no more than 500 to 600 mg of sodium per meal and no more than 150 mg per serving. Remember that 2 g = 2000 mg. The food label may also list foods as:  Sodium-free: Less than 5 mg in a serving.   Very low sodium: 35 mg or less in a serving.   Low-sodium: 140 mg or less in a serving.   Light in sodium: 50% less sodium in a serving. For example, if a food that usually has 300 mg of sodium is changed to become light in sodium, it will have 150 mg of sodium.   Reduced sodium: 25% less sodium in a serving. For example, if a food that usually has 400 mg of sodium is changed to reduced sodium, it will have 300 mg of sodium.  CHOOSING FOODS Grains  Avoid: Salted crackers and snack items. Some cereals, including instant hot cereals. Bread stuffing and biscuit mixes. Seasoned rice or pasta mixes.   Choose: Unsalted snack items. Low-sodium cereals, oats, puffed wheat and rice, shredded wheat. English muffins and bread. Pasta.  Meats  Avoid: Salted, canned, smoked, spiced, pickled meats, including fish and poultry. Bacon, ham, sausage, cold cuts, hot dogs, anchovies.   Choose: Low-sodium canned tuna and salmon. Fresh or frozen meat, poultry, and fish.   Dairy  Avoid: Processed cheese and spreads. Cottage cheese. Buttermilk and condensed milk. Regular cheese.   Choose: Milk. Low-sodium cottage cheese. Yogurt. Sour cream. Low-sodium cheese.  Fruits and Vegetables  Avoid: Regular canned vegetables. Regular canned tomato sauce and paste. Frozen vegetables in sauces. Olives. Rosita Fire. Relishes. Sauerkraut.   Choose: Low-sodium canned vegetables. Low-sodium tomato sauce and paste. Frozen or fresh vegetables. Fresh and frozen fruit.  Condiments  Avoid: Canned and packaged gravies. Worcestershire sauce. Tartar sauce. Barbecue sauce. Soy sauce. Steak sauce. Ketchup. Onion, garlic, and table salt. Meat flavorings and tenderizers.   Choose: Fresh and dried herbs and spices. Low-sodium varieties of mustard and ketchup.  Lemon juice. Tabasco sauce. Horseradish.   SAMPLE 2 GRAM SODIUM MEAL PLAN Breakfast / Sodium (mg)  1 cup low-fat milk / 143 mg   2 slices whole-wheat toast / 270 mg   1 tbs heart-healthy margarine / 153 mg   1 hard-boiled egg / 139 mg   1 small orange / 0 mg  Lunch / Sodium (mg)  1 cup raw carrots / 76 mg    cup hummus / 298 mg   1 cup low-fat milk / 143 mg    cup red grapes / 2 mg   1 whole-wheat pita bread / 356 mg  Dinner / Sodium (mg)  1 cup whole-wheat pasta / 2 mg   1 cup low-sodium tomato sauce / 73 mg   3 oz lean ground beef / 57 mg   1 small side salad (1 cup raw spinach leaves,  cup cucumber,  cup yellow bell pepper) with 1 tsp olive oil and 1 tsp red wine vinegar / 25 mg  Snack / Sodium (mg)  1 container low-fat vanilla yogurt / 107 mg   3 graham cracker squares / 127 mg  Nutrient Analysis  Calories: 2033   Protein: 77 g   Carbohydrate: 282 g   Fat: 72 g   Sodium: 1971 mg  Document Released: 04/09/2005 Document Revised: 03/29/2011 Document Reviewed: 07/11/2009 [facility] Patient Information 2012 [place], [place].

## 2011-12-26 NOTE — Assessment & Plan Note (Signed)
His triglycerides are way too high. Emphasized low carb diet with patient. Rechecked his lipids with Dr. Yetta Barre in December.

## 2011-12-26 NOTE — Assessment & Plan Note (Signed)
Stable exertional angina. No change in medical therapy. I emphasize importance of tight blood sugar control and reducing history glycerides. His LDL is at goal. I've also talked to him about making sure his blood pressures under good control outside the office.

## 2011-12-26 NOTE — Progress Notes (Signed)
HPI Justin Galvan returns today for evaluation and management of his coronary disease and history of MI. He also is hyperlipidemia and hypertension and type 2 diabetes.  He is followed by Dr. Yetta Barre of the primary care division. His blood work is followed there.  If he over exerts himself, , he gets a little burning in his neck. It goes away with rest. He has had no rest angina. He has not had to take sublingual nitroglycerin. Is stable pattern for him.  He has been compliant with his meds. His blood pressures generally up in the office.  He denies orthopnea, PND or edema.  Past Medical History  Diagnosis Date  . CAD (coronary artery disease)   . MI (myocardial infarction)   . HLD (hyperlipidemia)   . HTN (hypertension)   . DM2 (diabetes mellitus, type 2)   . BPH (benign prostatic hypertrophy)   . Depression     Current Outpatient Prescriptions  Medication Sig Dispense Refill  . Blood Glucose Monitoring Suppl (BAYER CONTOUR MONITOR) W/DEVICE KIT as directed.        . carvedilol (COREG CR) 40 MG 24 hr capsule Take 1 capsule (40 mg total) by mouth daily.  90 capsule  1  . ezetimibe-simvastatin (VYTORIN) 10-40 MG per tablet Take 1 tablet by mouth at bedtime.  90 tablet  1  . glucose blood (BAYER CONTOUR TEST) test strip Use as instructed       . omega-3 acid ethyl esters (LOVAZA) 1 G capsule Take 2 capsules (2 g total) by mouth 2 (two) times daily.  360 capsule  1  . PARoxetine (PAXIL) 40 MG tablet Take 1 tablet (40 mg total) by mouth every morning.  90 tablet  1  . quinapril-hydrochlorothiazide (ACCURETIC) 20-12.5 MG per tablet Take 1 tablet by mouth daily.  90 tablet  1  . SitaGLIPtin-MetFORMIN HCl (JANUMET XR) [id number] MG TB24 Take 1 tablet by mouth daily.  90 tablet  1    No Known Allergies  Family History  Problem Relation Age of Onset  . Hypertension    . Cancer Neg Hx     History   Social History  . Marital Status: Married    Spouse Name: N/A    Number of Children: N/A    . Years of Education: N/A   Occupational History  . contractor    Social History Main Topics  . Smoking status: Former Smoker    Quit date: 09/07/1996  . Smokeless tobacco: Not on file  . Alcohol Use: 1.8 oz/week    3 Cans of beer per week  . Drug Use: No  . Sexually Active: Yes   Other Topics Concern  . Not on file   Social History Narrative  . No narrative on file    ROS ALL NEGATIVE EXCEPT THOSE NOTED IN HPI  PE  General Appearance: well developed, well nourished in no acute distress, ruddy complexion HEENT: symmetrical face, PERRLA, good dentition  Neck: no JVD, thyromegaly, or adenopathy, trachea midline Chest: symmetric without deformity Cardiac: PMI non-displaced, RRR, normal S1, S2, no gallop or murmur Lung: clear to ausculation and percussion Vascular: all pulses full without bruits  Abdominal: nondistended, nontender, good bowel sounds, no HSM, no bruits Extremities: no cyanosis, clubbing or edema, no sign of DVT, no varicosities  Skin: normal color, no rashes Neuro: alert and oriented x 3, non-focal Pysch: normal affect  EKG Not repeated BMET    Component Value Date/Time   NA 137 04/10/2011 1110  K 3.9 04/10/2011 1110   CL 99 04/10/2011 1110   CO2 27 04/10/2011 1110   GLUCOSE 273* 04/10/2011 1110   BUN 16 04/10/2011 1110   CREATININE 0.7 04/10/2011 1110   CALCIUM 9.2 04/10/2011 1110   GFRNONAA 126.76 08/19/2008 0914   GFRAA 115 02/09/2008 1017    Lipid Panel     Component Value Date/Time   CHOL 169 04/10/2011 1110   TRIG 425.0* 04/10/2011 1110   HDL 37.90* 04/10/2011 1110   CHOLHDL 4 04/10/2011 1110   VLDL 85.0* 04/10/2011 1110    CBC    Component Value Date/Time   WBC 7.2 04/10/2011 1110   RBC 5.12 04/10/2011 1110   HGB 15.5 04/10/2011 1110   HCT 44.2 04/10/2011 1110   PLT 230.0 04/10/2011 1110   MCV 86.3 04/10/2011 1110   MCHC 35.1 04/10/2011 1110   RDW 12.8 04/10/2011 1110   LYMPHSABS 2.0 04/10/2011 1110   MONOABS 0.7  04/10/2011 1110   EOSABS 0.1 04/10/2011 1110   BASOSABS 0.0 04/10/2011 1110

## 2011-12-26 NOTE — Assessment & Plan Note (Signed)
Poor control. Hemoglobin A1c goal less than 7%.

## 2011-12-31 ENCOUNTER — Telehealth: Payer: Self-pay | Admitting: Internal Medicine

## 2011-12-31 DIAGNOSIS — E119 Type 2 diabetes mellitus without complications: Secondary | ICD-10-CM

## 2011-12-31 MED ORDER — SITAGLIP PHOS-METFORMIN HCL ER 100-1000 MG PO TB24: 1.0000 | ORAL_TABLET | Freq: Every day | ORAL | Status: DC

## 2011-12-31 NOTE — Telephone Encounter (Signed)
yes

## 2011-12-31 NOTE — Telephone Encounter (Signed)
Pt wife called stating husband needed refill of Diabetic meds (Janumet XR) . Has an appt with you on Thursday can we refill enough to get him through until then? Please advise.

## 2011-12-31 NOTE — Telephone Encounter (Signed)
Done

## 2012-01-02 ENCOUNTER — Encounter: Payer: Self-pay | Admitting: Internal Medicine

## 2012-01-02 ENCOUNTER — Other Ambulatory Visit (INDEPENDENT_AMBULATORY_CARE_PROVIDER_SITE_OTHER): Payer: 59

## 2012-01-02 ENCOUNTER — Ambulatory Visit (INDEPENDENT_AMBULATORY_CARE_PROVIDER_SITE_OTHER): Payer: 59 | Admitting: Internal Medicine

## 2012-01-02 VITALS — BP 118/80 | HR 67 | Temp 97.8°F | Resp 16 | Wt 182.0 lb

## 2012-01-02 DIAGNOSIS — E119 Type 2 diabetes mellitus without complications: Secondary | ICD-10-CM

## 2012-01-02 DIAGNOSIS — I10 Essential (primary) hypertension: Secondary | ICD-10-CM

## 2012-01-02 DIAGNOSIS — E785 Hyperlipidemia, unspecified: Secondary | ICD-10-CM

## 2012-01-02 LAB — COMPREHENSIVE METABOLIC PANEL
AST: 23 U/L (ref 0–37)
Albumin: 4.5 g/dL (ref 3.5–5.2)
Alkaline Phosphatase: 53 U/L (ref 39–117)
BUN: 20 mg/dL (ref 6–23)
Calcium: 9.3 mg/dL (ref 8.4–10.5)
Chloride: 99 mEq/L (ref 96–112)
Glucose, Bld: 236 mg/dL — ABNORMAL HIGH (ref 70–99)
Potassium: 4.2 mEq/L (ref 3.5–5.1)
Sodium: 137 mEq/L (ref 135–145)
Total Protein: 7.3 g/dL (ref 6.0–8.3)

## 2012-01-02 LAB — TSH: TSH: 1.98 u[IU]/mL (ref 0.35–5.50)

## 2012-01-02 LAB — LIPID PANEL: Triglycerides: 276 mg/dL — ABNORMAL HIGH (ref 0.0–149.0)

## 2012-01-02 LAB — HEMOGLOBIN A1C: Hgb A1c MFr Bld: 7.6 % — ABNORMAL HIGH (ref 4.6–6.5)

## 2012-01-02 MED ORDER — SITAGLIP PHOS-METFORMIN HCL ER 100-1000 MG PO TB24: 1.0000 | ORAL_TABLET | Freq: Every day | ORAL | Status: DC

## 2012-01-02 MED ORDER — OMEGA-3-ACID ETHYL ESTERS 1 G PO CAPS: 2.0000 g | ORAL_CAPSULE | Freq: Two times a day (BID) | ORAL | Status: DC

## 2012-01-02 NOTE — Patient Instructions (Signed)

## 2012-01-02 NOTE — Progress Notes (Signed)
Subjective:    Patient ID: Justin Galvan, male    DOB: 06-Sep-1960, 51 y.o.   MRN: [id number]  Diabetes He presents for his follow-up diabetic visit. He has type 2 diabetes mellitus. His disease course has been worsening. Hypoglycemia symptoms include dizziness. Pertinent negatives for hypoglycemia include no seizures, speech difficulty or tremors. Associated symptoms include polydipsia, polyphagia and polyuria. Pertinent negatives for diabetes include no blurred vision, no chest pain, no fatigue, no foot paresthesias, no foot ulcerations, no visual change, no weakness and no weight loss. There are no hypoglycemic complications. Symptoms are worsening. Diabetic complications include heart disease. When asked about current treatments, none were reported. He is compliant with treatment none of the time. His weight is stable. He is following a generally healthy diet. Meal planning includes avoidance of concentrated sweets. He participates in exercise intermittently. His home blood glucose trend is increasing steadily. An ACE inhibitor/angiotensin II receptor blocker is being taken. He does not see a podiatrist.Eye exam is current.      Review of Systems  Constitutional: Negative for fever, chills, weight loss, diaphoresis, activity change, appetite change, fatigue and unexpected weight change.  HENT: Negative.   Eyes: Negative.  Negative for blurred vision.  Respiratory: Negative for cough, chest tightness, shortness of breath, wheezing and stridor.   Cardiovascular: Negative for chest pain, palpitations and leg swelling.  Gastrointestinal: Negative for nausea, vomiting, abdominal pain, diarrhea and constipation.  Genitourinary: Positive for polyuria.  Musculoskeletal: Negative for myalgias, back pain, joint swelling, arthralgias and gait problem.  Neurological: Positive for dizziness. Negative for tremors, seizures, syncope, facial asymmetry, speech difficulty, weakness, light-headedness and  numbness.  Hematological: Positive for polydipsia and polyphagia. Negative for adenopathy. Does not bruise/bleed easily.  Psychiatric/Behavioral: Negative.        Objective:   Physical Exam  Vitals reviewed. Constitutional: He is oriented to person, place, and time. He appears well-developed and well-nourished.  Non-toxic appearance. He does not have a sickly appearance. He does not appear ill. No distress.  HENT:  Head: Normocephalic and atraumatic.  Mouth/Throat: Oropharynx is clear and moist. No oropharyngeal exudate.  Eyes: Conjunctivae normal are normal. Right eye exhibits no discharge. Left eye exhibits no discharge. No scleral icterus.  Neck: Normal range of motion. Neck supple. No JVD present. No tracheal deviation present. No thyromegaly present.  Cardiovascular: Normal rate, regular rhythm, normal heart sounds and intact distal pulses.  Exam reveals no gallop and no friction rub.   No murmur heard. Pulmonary/Chest: Effort normal and breath sounds normal. No stridor. No respiratory distress. He has no wheezes. He has no rales. He exhibits no tenderness.  Abdominal: Soft. Bowel sounds are normal. He exhibits no distension and no mass. There is no tenderness. There is no rebound and no guarding.  Musculoskeletal: Normal range of motion. He exhibits no edema and no tenderness.  Lymphadenopathy:    He has no cervical adenopathy.  Neurological: He is oriented to person, place, and time.  Skin: Skin is warm and dry. No rash noted. He is not diaphoretic. No erythema. No pallor.  Psychiatric: He has a normal mood and affect. His behavior is normal. Judgment and thought content normal.      Lab Results  Component Value Date   WBC 7.2 04/10/2011   HGB 15.5 04/10/2011   HCT 44.2 04/10/2011   PLT 230.0 04/10/2011   GLUCOSE 273* 04/10/2011   CHOL 169 04/10/2011   TRIG 425.0* 04/10/2011   HDL 37.90* 04/10/2011   LDLDIRECT 73.6 04/10/2011  ALT 30 04/10/2011   AST 22 04/10/2011    NA 137 04/10/2011   K 3.9 04/10/2011   CL 99 04/10/2011   CREATININE 0.7 04/10/2011   BUN 16 04/10/2011   CO2 27 04/10/2011   TSH 1.77 04/10/2011   PSA 0.43 04/10/2011   HGBA1C 10.6* 04/10/2011   MICROALBUR 0.5 08/19/2008      Assessment & Plan:

## 2012-01-03 ENCOUNTER — Encounter: Payer: Self-pay | Admitting: Internal Medicine

## 2012-01-03 LAB — MICROALBUMIN / CREATININE URINE RATIO
Microalb Creat Ratio: 0.3 mg/g (ref 0.0–30.0)
Microalb, Ur: 0.3 mg/dL (ref 0.0–1.9)

## 2012-01-03 NOTE — Assessment & Plan Note (Signed)
He is tolerating his current statin well, I will check his FLP today

## 2012-01-03 NOTE — Assessment & Plan Note (Signed)
He will restart his meds today and I will check his a1c and will monitor his renal function

## 2012-01-03 NOTE — Assessment & Plan Note (Signed)
His BP is well controlled, I will check his lytes and renal function 

## 2012-01-04 ENCOUNTER — Encounter: Payer: Self-pay | Admitting: Internal Medicine

## 2012-01-11 ENCOUNTER — Other Ambulatory Visit: Payer: Self-pay | Admitting: *Deleted

## 2012-01-11 ENCOUNTER — Other Ambulatory Visit: Payer: Self-pay | Admitting: Cardiology

## 2012-01-11 MED ORDER — PAROXETINE HCL 40 MG PO TABS: 40.0000 mg | ORAL_TABLET | ORAL | Status: DC

## 2012-01-11 MED ORDER — QUINAPRIL-HYDROCHLOROTHIAZIDE 20-12.5 MG PO TABS: 2.0000 | ORAL_TABLET | Freq: Every day | ORAL | Status: DC

## 2012-01-11 MED ORDER — CARVEDILOL PHOSPHATE ER 40 MG PO CP24: 40.0000 mg | ORAL_CAPSULE | Freq: Two times a day (BID) | ORAL | Status: DC

## 2012-01-11 NOTE — Telephone Encounter (Signed)
Pt needs refill called into mail order [phone] was to be called in at last office visit,

## 2012-01-11 NOTE — Telephone Encounter (Signed)
Medications were sent originally sent to local [facility] in aug and beginning of sept. Per phone note patient says rx were never sent and wants them at [facility].  New rx refills were sent to [facility] and blood testing strips were routed to PCP who monitors his blood sugar levels.   Gadiel John CMA

## 2012-01-13 MED ORDER — GLUCOSE BLOOD VI STRP: ORAL_STRIP | Status: DC

## 2012-01-15 ENCOUNTER — Telehealth: Payer: Self-pay | Admitting: Cardiology

## 2012-01-15 ENCOUNTER — Other Ambulatory Visit: Payer: Self-pay | Admitting: Cardiology

## 2012-01-15 MED ORDER — EZETIMIBE-SIMVASTATIN 10-40 MG PO TABS: 1.0000 | ORAL_TABLET | Freq: Every day | ORAL | Status: DC

## 2012-01-15 NOTE — Telephone Encounter (Signed)
Mr. Kurtzman called and stated he was returning his Rx's to [facility] and asked if I could call in a new Rx of ezetimebe-simvastatin (VYTORIN) 10-40 MG tablet.  Caralee Ates, CMA  Called [facility] - spoke with Regis Bill one of the pharmacist, gave verbal refill for ezetimebe-simvastatin (VYTORIN) 10-40 MG tablet, pt is to take 1- tablet by mouth at bedtime qd.  Disp 90 tablet with 3 refill.  Called the pt back, informed him that I had called in a new Rx of ezetimebe-simvastatin (VYTORIN) 10-40 MG tablet, pt is to take 1- tablet by mouth at bedtime qd.  Disp 90 tablet with 3 refill.  Pt aware of all instruction, I asked if he had enough of the medication to last till it arrived, he stated he was sure he did but would call back if he did not have enough to last until the medication arrived.  Caralee Ates, CMA

## 2012-01-15 NOTE — Telephone Encounter (Signed)
Error

## 2012-08-01 ENCOUNTER — Other Ambulatory Visit: Payer: Self-pay | Admitting: Cardiology

## 2012-10-02 ENCOUNTER — Telehealth: Payer: Self-pay | Admitting: Cardiology

## 2012-10-02 NOTE — Telephone Encounter (Signed)
New problem    pts wife called and wants to know who pts phy will be

## 2012-10-02 NOTE — Telephone Encounter (Signed)
Advised pt that his care will probably handed over to Dr. Delton See.  Patient asking to be seen one more time by Dr. Daleen Squibb before Dr. Daleen Squibb leaves.  Will forward to Mylo Red to find a place on the schedule.

## 2013-04-13 ENCOUNTER — Other Ambulatory Visit: Payer: Self-pay | Admitting: *Deleted

## 2013-04-13 MED ORDER — EZETIMIBE-SIMVASTATIN 10-40 MG PO TABS: 1.0000 | ORAL_TABLET | Freq: Every day | ORAL | Status: DC

## 2013-04-13 MED ORDER — CARVEDILOL PHOSPHATE ER 40 MG PO CP24: 40.0000 mg | ORAL_CAPSULE | Freq: Two times a day (BID) | ORAL | Status: DC

## 2013-04-17 ENCOUNTER — Telehealth: Payer: Self-pay | Admitting: *Deleted

## 2013-04-17 NOTE — Telephone Encounter (Signed)
Pt called for refills, Vytorin and Coreg. They were filled [facility] dec.

## 2013-05-01 ENCOUNTER — Encounter: Payer: Self-pay | Admitting: *Deleted

## 2013-05-01 ENCOUNTER — Other Ambulatory Visit: Payer: Self-pay | Admitting: *Deleted

## 2013-05-01 ENCOUNTER — Encounter (HOSPITAL_COMMUNITY): Payer: Self-pay | Admitting: Pharmacy Technician

## 2013-05-01 ENCOUNTER — Encounter: Payer: Self-pay | Admitting: Cardiovascular Disease

## 2013-05-01 ENCOUNTER — Ambulatory Visit (INDEPENDENT_AMBULATORY_CARE_PROVIDER_SITE_OTHER): Payer: 59 | Admitting: Cardiovascular Disease

## 2013-05-01 VITALS — BP 174/110 | HR 92 | Ht 67.0 in | Wt 183.0 lb

## 2013-05-01 DIAGNOSIS — I10 Essential (primary) hypertension: Secondary | ICD-10-CM

## 2013-05-01 DIAGNOSIS — I2510 Atherosclerotic heart disease of native coronary artery without angina pectoris: Secondary | ICD-10-CM

## 2013-05-01 DIAGNOSIS — I200 Unstable angina: Secondary | ICD-10-CM

## 2013-05-01 DIAGNOSIS — E119 Type 2 diabetes mellitus without complications: Secondary | ICD-10-CM

## 2013-05-01 DIAGNOSIS — E785 Hyperlipidemia, unspecified: Secondary | ICD-10-CM

## 2013-05-01 LAB — CBC WITH DIFFERENTIAL/PLATELET
BASOS ABS: 0 10*3/uL (ref 0.0–0.1)
Basophils Relative: 1 % (ref 0–1)
EOS ABS: 0.2 10*3/uL (ref 0.0–0.7)
Eosinophils Relative: 3 % (ref 0–5)
HCT: 41.3 % (ref 39.0–52.0)
Hemoglobin: 14.7 g/dL (ref 13.0–17.0)
LYMPHS ABS: 2.2 10*3/uL (ref 0.7–4.0)
LYMPHS PCT: 31 % (ref 12–46)
MCH: 29.6 pg (ref 26.0–34.0)
MCHC: 35.6 g/dL (ref 30.0–36.0)
MCV: 83.3 fL (ref 78.0–100.0)
Monocytes Absolute: 0.7 10*3/uL (ref 0.1–1.0)
Monocytes Relative: 10 % (ref 3–12)
NEUTROS PCT: 55 % (ref 43–77)
Neutro Abs: 3.9 10*3/uL (ref 1.7–7.7)
PLATELETS: 277 10*3/uL (ref 150–400)
RBC: 4.96 MIL/uL (ref 4.22–5.81)
RDW: 14.1 % (ref 11.5–15.5)
WBC: 7.1 10*3/uL (ref 4.0–10.5)

## 2013-05-01 LAB — BASIC METABOLIC PANEL
BUN: 14 mg/dL (ref 6–23)
CO2: 27 mEq/L (ref 19–32)
CREATININE: 0.84 mg/dL (ref 0.50–1.35)
Calcium: 9 mg/dL (ref 8.4–10.5)
Chloride: 98 mEq/L (ref 96–112)
Glucose, Bld: 177 mg/dL — ABNORMAL HIGH (ref 70–99)
Potassium: 3.6 mEq/L (ref 3.5–5.3)
Sodium: 133 mEq/L — ABNORMAL LOW (ref 135–145)

## 2013-05-01 LAB — PROTIME-INR
INR: 0.89 (ref <1.50)
PROTHROMBIN TIME: 12 s (ref 11.6–15.2)

## 2013-05-01 MED ORDER — CARVEDILOL 12.5 MG PO TABS: 12.5000 mg | ORAL_TABLET | Freq: Two times a day (BID) | ORAL | Status: DC

## 2013-05-01 MED ORDER — EZETIMIBE-SIMVASTATIN 10-40 MG PO TABS: 1.0000 | ORAL_TABLET | Freq: Every day | ORAL | Status: DC

## 2013-05-01 MED ORDER — QUINAPRIL-HYDROCHLOROTHIAZIDE 20-12.5 MG PO TABS: 1.0000 | ORAL_TABLET | Freq: Two times a day (BID) | ORAL | Status: DC

## 2013-05-01 NOTE — Progress Notes (Signed)
   History of Present Illness: 52 yo male with history of CAD, DM, HTN, HLD who is here today for cardiac follow up. He has been followed by Dr. Wall. He has had prior stenting procedures of the RCA with his first MI in 1997. Last cath in March 2005 with two Taxus stents placed in the proximal and mid RCA. Moderate disease in LAD and Circumflex.   He is here today for follow up. He tells me that he has been having chest pains with minimal exertion. This radiates into his neck. This is similar to his prior angina before he needed stents.   Primary Care Physician: Thomas Jones  Last Lipid Profile:Lipid Panel     Component Value Date/Time   CHOL 152 01/02/2012 0834   TRIG 276.0* 01/02/2012 0834   HDL 44.10 01/02/2012 0834   CHOLHDL 3 01/02/2012 0834   VLDL 55.2* 01/02/2012 0834     Past Medical History  Diagnosis Date  . CAD (coronary artery disease)   . MI (myocardial infarction)   . HLD (hyperlipidemia)   . HTN (hypertension)   . DM2 (diabetes mellitus, type 2)   . BPH (benign prostatic hypertrophy)   . Depression     Past Surgical History  Procedure Laterality Date  . Coronary angioplasty with stent placement      Current Outpatient Prescriptions  Medication Sig Dispense Refill  . Blood Glucose Monitoring Suppl (BAYER CONTOUR MONITOR) W/DEVICE KIT as directed.        . ezetimibe-simvastatin (VYTORIN) 10-40 MG per tablet Take 1 tablet by mouth at bedtime.  90 tablet  0  . glucose blood (BAYER CONTOUR TEST) test strip Use as instructed  100 each  11  . PARoxetine (PAXIL) 40 MG tablet TAKE 1 TABLET EVERY MORNING  90 tablet  2  . quinapril-hydrochlorothiazide (ACCURETIC) 20-12.5 MG per tablet TAKE ONE  TABLETS DAILY      . SitaGLIPtin-MetFORMIN HCl (JANUMET XR) 100-1000 MG TB24 Take 1 tablet by mouth daily.  90 tablet  3  . carvedilol (COREG CR) 40 MG 24 hr capsule Take 1 capsule (40 mg total) by mouth 2 (two) times daily.  180 capsule  0   No current facility-administered  medications for this visit.    No Known Allergies  History   Social History  . Marital Status: Married    Spouse Name: N/A    Number of Children: 2  . Years of Education: N/A   Occupational History  . contractor    Social History Main Topics  . Smoking status: Former Smoker    Quit date: 09/07/1996  . Smokeless tobacco: Not on file  . Alcohol Use: 2.4 oz/week    4 Cans of beer per week  . Drug Use: No  . Sexual Activity: Yes   Other Topics Concern  . Not on file   Social History Narrative  . No narrative on file    Family History  Problem Relation Age of Onset  . Hypertension    . Cancer Neg Hx     Review of Systems:  As stated in the HPI and otherwise negative.   BP 174/110  Pulse 92  Ht 5' 7" (1.702 m)  Wt 183 lb (83.008 kg)  BMI 28.66 kg/m2  Physical Examination: General: Well developed, well nourished, NAD HEENT: OP clear, mucus membranes moist SKIN: warm, dry. No rashes. Neuro: No focal deficits Musculoskeletal: Muscle strength 5/5 all ext Psychiatric: Mood and affect normal Neck: No JVD, no carotid   bruits, no thyromegaly, no lymphadenopathy. Lungs:Clear bilaterally, no wheezes, rhonci, crackles Cardiovascular: Regular rate and rhythm. No murmurs, gallops or rubs. Abdomen:Soft. Bowel sounds present. Non-tender.  Extremities: No lower extremity edema. Pulses are 2 + in the bilateral DP/PT.  EKG: NSR, rate 92 bpm. Inferior Q-waves, unchanged.   Cardiac cath 2005:  Left Main: The left main is normal.  Left Anterior Descending Artery: The left anterior descending artery has a  tubular 70% stenosis in the midvessel across the origin over the first  diagonal branch. The first diagonal branch itself is small in caliber and  has a 70% stenosis at its origin. The distal LAD has a 40% stenosis. There  is a normal-sized second diagonal branch, which arises from the mid LAD.  This has a 30% stenosis proximally.  Left Circumflex: The left circumflex gives  rise to a small first obtuse  marginal and a large second obtuse marginal branch. There is a 50% stenosis  in the mid circumflex.  Right Coronary Artery: The right coronary artery is a dominant vessel with  diffuse disease. There is a stent in the proximal vessel with diffuse 30-  40% stenosis within the stent. Just beyond the stent there is a 95%  stenosis with haziness and the appearance of a ruptured plaque. Distal to  this in the mid right coronary artery there is a 25% stenosis. There is a  stent in the mid right coronary artery with less than 20% stenosis within  the stent. Beyond this stent in the distal portion of the mid right  coronary artery at the acute margin there is an area that appears to be  between 50 and 70% stenosis. There is a stent in the distal vessel with a  diffuse 50% in-stent restenosis. The distal right coronary artery gives  rise to a large posterior descending artery and a large posterolateral  Branch.  Echo 02/14/06: Overall left ventricular systolic function was at the lower limits of normal. Left ventricular ejection fraction was estimated , range being 40 % to 45 %. There was hypokinesis of the inferoposterior wall. - The left atrium was mildly dilated.  Assessment and Plan:   1. CAD/Unstable angina: He is known to have moderate LAD and Circumflex disease and multiple stents in the RCA. Last cath in 2005. He no longer smokes. Recent chest pain is c/w class III unstable angina. BP is elevated. He has been out of Coreg for the last few days and has only been taking Quinapril/HCTZ once daily. Will resume Coreg and BID dosing of Quinapril/HCTZ. Will arrange cardiac cath on 05/06/13 at [facility]. Risks and benefits reviewed. Pt agrees to proceed. Pre-cath labs today. Continue ASA.   2. HTN: BP elevated today. He has been out of Coreg. Will resume Coreg 12.5 mg po BID and change Quinapril/HCTZ back to BID dosing.   3. HLD: He is on Vytorin. No changes. Needs lipids  and LFTs updated at next visit.   4. DM: Per primary care.   I meeting him for the first time. Extensive record review including old EKG, cath reports and office visits.  

## 2013-05-01 NOTE — Patient Instructions (Addendum)
Your physician recommends that you schedule a follow-up appointment in: about 5 weeks.   Your physician has requested that you have a cardiac catheterization. Cardiac catheterization is used to diagnose and/or treat various heart conditions. Doctors may recommend this procedure for a number of different reasons. The most common reason is to evaluate chest pain. Chest pain can be a symptom of coronary artery disease (CAD), and cardiac catheterization can show whether plaque is narrowing or blocking your heart's arteries. This procedure is also used to evaluate the valves, as well as measure the blood flow and oxygen levels in different parts of your heart. For further information please visit [email or web address] Please follow instruction sheet, as given. Scheduled for May 06, 2013  Your physician has recommended you make the following change in your medication:  Stop Coreg SR. Start Coreg 12.5 mg by mouth twice daily.   Take quinapril-hydrochlorothiazide twice daily   Coronary Angiography Coronary angiography is an X-ray procedure used to look at the arteries in the heart. In this procedure, a dye (contrast dye) is injected through a long, hollow tube (catheter). The catheter is about the size of a piece of cooked spaghetti and is inserted through your groin, wrist, or arm. The dye is injected into each artery, and X-rays are then taken to show if there is a blockage in the arteries of your heart. LET [facility] KNOW ABOUT:  Any allergies you have, including allergies to shellfish or contrast dye.   All medicines you are taking, including vitamins, herbs, eye drops, creams, and over-the-counter medicines.   Previous problems you or members of your family have had with the use of anesthetics.   Any blood disorders you have.   Previous surgeries you have had.  History of kidney problems or failure.   Other medical conditions you have. RISKS AND COMPLICATIONS    Generally, coronary angiography is a safe procedure. However, as with any procedure, complications can occur. Possible complications include:  Allergic reaction to the dye.  Bleeding from the access site or other locations.  Kidney injury, especially in people with impaired kidney function.  Stroke (rare).  Heart attack (rare). BEFORE THE PROCEDURE   Do not eat or drink anything after midnight the night before the procedure, or as directed by your health care provider.   Ask your health care provider if it is okay to take any needed medicines with a sip of water.  PROCEDURE  You may be given a medicine to help you relax (sedative) before the procedure. This medicine is given through an intravenous (IV) access tube that is inserted into one of your veins.   The area where the catheter will be inserted is washed and shaved. This is usually done in the groin but may be done in the fold of your arm (near your elbow) or in the wrist.   A medicine will be given to numb the area where the catheter will be inserted (local anesthetic).   The health care provider will insert the catheter into an artery. The catheter is guided by using a special type of X-ray (fluoroscopy) of the blood vessel being examined.   A special dye is then injected into the catheter, and X-rays are taken. The dye helps to show where any narrowing or blockages are located in the heart arteries.  AFTER THE PROCEDURE   If the procedure is done through the leg, you will be kept in bed lying flat for several hours. You will be instructed  to not bend or cross your legs.  The insertion site will be checked frequently.   The pulse in your feet or wrist will be checked frequently.   Additional blood tests, X-rays, and an electrocardiogram may be done.   You may need to stay in the hospital overnight for observation.  Document Released: 10/14/2002 Document Revised: 12/10/2012 Document Reviewed:  09/01/2012 [facility] Patient Information 2014 [place], [place].

## 2013-05-06 ENCOUNTER — Encounter (HOSPITAL_COMMUNITY): Payer: Self-pay | Admitting: General Practice

## 2013-05-06 ENCOUNTER — Encounter (HOSPITAL_COMMUNITY)
Admission: RE | Disposition: A | Discharge status: Home / Self Care | Payer: Self-pay | Source: Ambulatory Visit | Attending: Cardiovascular Disease

## 2013-05-06 ENCOUNTER — Ambulatory Visit (HOSPITAL_COMMUNITY)
Admission: RE | Admit: 2013-05-06 | Discharge: 2013-05-07 | Disposition: A | Discharge status: Home / Self Care | Payer: 59 | Source: Ambulatory Visit | Attending: Cardiovascular Disease | Admitting: Cardiovascular Disease

## 2013-05-06 DIAGNOSIS — I10 Essential (primary) hypertension: Secondary | ICD-10-CM | POA: Diagnosis present

## 2013-05-06 DIAGNOSIS — E1165 Type 2 diabetes mellitus with hyperglycemia: Secondary | ICD-10-CM

## 2013-05-06 DIAGNOSIS — I252 Old myocardial infarction: Secondary | ICD-10-CM

## 2013-05-06 DIAGNOSIS — F329 Major depressive disorder, single episode, unspecified: Secondary | ICD-10-CM | POA: Insufficient documentation

## 2013-05-06 DIAGNOSIS — IMO0001 Reserved for inherently not codable concepts without codable children: Secondary | ICD-10-CM

## 2013-05-06 DIAGNOSIS — N400 Enlarged prostate without lower urinary tract symptoms: Secondary | ICD-10-CM | POA: Insufficient documentation

## 2013-05-06 DIAGNOSIS — Z9861 Coronary angioplasty status: Secondary | ICD-10-CM | POA: Insufficient documentation

## 2013-05-06 DIAGNOSIS — I70209 Unspecified atherosclerosis of native arteries of extremities, unspecified extremity: Secondary | ICD-10-CM

## 2013-05-06 DIAGNOSIS — E785 Hyperlipidemia, unspecified: Secondary | ICD-10-CM | POA: Insufficient documentation

## 2013-05-06 DIAGNOSIS — I200 Unstable angina: Secondary | ICD-10-CM

## 2013-05-06 DIAGNOSIS — E1151 Type 2 diabetes mellitus with diabetic peripheral angiopathy without gangrene: Secondary | ICD-10-CM | POA: Diagnosis present

## 2013-05-06 DIAGNOSIS — R079 Chest pain, unspecified: Secondary | ICD-10-CM | POA: Diagnosis present

## 2013-05-06 DIAGNOSIS — I519 Heart disease, unspecified: Secondary | ICD-10-CM | POA: Insufficient documentation

## 2013-05-06 DIAGNOSIS — Z87891 Personal history of nicotine dependence: Secondary | ICD-10-CM | POA: Insufficient documentation

## 2013-05-06 DIAGNOSIS — Z955 Presence of coronary angioplasty implant and graft: Secondary | ICD-10-CM

## 2013-05-06 DIAGNOSIS — I2510 Atherosclerotic heart disease of native coronary artery without angina pectoris: Secondary | ICD-10-CM | POA: Diagnosis present

## 2013-05-06 DIAGNOSIS — E781 Pure hyperglyceridemia: Secondary | ICD-10-CM | POA: Diagnosis present

## 2013-05-06 DIAGNOSIS — F3289 Other specified depressive episodes: Secondary | ICD-10-CM | POA: Insufficient documentation

## 2013-05-06 HISTORY — DX: Presence of coronary angioplasty implant and graft: Z95.5

## 2013-05-06 HISTORY — DX: Unspecified osteoarthritis, unspecified site: M19.90

## 2013-05-06 HISTORY — PX: CORONARY ANGIOPLASTY WITH STENT PLACEMENT: SHX49

## 2013-05-06 HISTORY — PX: LEFT HEART CATHETERIZATION WITH CORONARY ANGIOGRAM: SHX5451

## 2013-05-06 HISTORY — DX: Atherosclerotic heart disease of native coronary artery without angina pectoris: I25.10

## 2013-05-06 HISTORY — DX: Angina pectoris, unspecified: I20.9

## 2013-05-06 LAB — GLUCOSE, CAPILLARY
GLUCOSE-CAPILLARY: 271 mg/dL — AB (ref 70–99)
GLUCOSE-CAPILLARY: 212 mg/dL — AB (ref 70–99)
GLUCOSE-CAPILLARY: 262 mg/dL — AB (ref 70–99)
Glucose-Capillary: 247 mg/dL — ABNORMAL HIGH (ref 70–99)

## 2013-05-06 LAB — POCT ACTIVATED CLOTTING TIME
ACTIVATED CLOTTING TIME: 199 s
Activated Clotting Time: 260 seconds

## 2013-05-06 SURGERY — LEFT HEART CATHETERIZATION WITH CORONARY ANGIOGRAM
Anesthesia: LOCAL

## 2013-05-06 MED ORDER — LISINOPRIL 20 MG PO TABS
20.0000 mg | ORAL_TABLET | Freq: Every day | ORAL | Status: DC
  Administered 2013-05-06: 21:00:00 20 mg via ORAL
  Filled 2013-05-06: qty 1

## 2013-05-06 MED ORDER — LISINOPRIL 20 MG PO TABS
20.0000 mg | ORAL_TABLET | Freq: Every day | ORAL | Status: DC
  Filled 2013-05-06: qty 1

## 2013-05-06 MED ORDER — ACETAMINOPHEN 325 MG PO TABS: 650.0000 mg | ORAL_TABLET | ORAL | Status: DC | PRN

## 2013-05-06 MED ORDER — MIDAZOLAM HCL 2 MG/2ML IJ SOLN
INTRAMUSCULAR | Status: AC
  Filled 2013-05-06: qty 2

## 2013-05-06 MED ORDER — LIVING WELL WITH DIABETES BOOK
Freq: Once | Status: AC
  Administered 2013-05-06: 22:00:00
  Filled 2013-05-06: qty 1

## 2013-05-06 MED ORDER — SODIUM CHLORIDE 0.9 % IJ SOLN: 3.0000 mL | INTRAMUSCULAR | Status: DC | PRN

## 2013-05-06 MED ORDER — PAROXETINE HCL 20 MG PO TABS
40.0000 mg | ORAL_TABLET | Freq: Every day | ORAL | Status: DC
  Administered 2013-05-06: 21:00:00 40 mg via ORAL

## 2013-05-06 MED ORDER — LIDOCAINE HCL (PF) 1 % IJ SOLN
INTRAMUSCULAR | Status: AC
  Filled 2013-05-06: qty 30

## 2013-05-06 MED ORDER — SODIUM CHLORIDE 0.9 % IV SOLN: INTRAVENOUS | Status: AC

## 2013-05-06 MED ORDER — ASPIRIN 81 MG PO CHEW
81.0000 mg | CHEWABLE_TABLET | Freq: Every day | ORAL | Status: DC
  Administered 2013-05-07: 81 mg via ORAL
  Filled 2013-05-06: qty 1

## 2013-05-06 MED ORDER — HYDROCHLOROTHIAZIDE 12.5 MG PO CAPS
12.5000 mg | ORAL_CAPSULE | Freq: Every day | ORAL | Status: DC
  Filled 2013-05-06: qty 1

## 2013-05-06 MED ORDER — OXYCODONE-ACETAMINOPHEN 5-325 MG PO TABS: 1.0000 | ORAL_TABLET | ORAL | Status: DC | PRN

## 2013-05-06 MED ORDER — EZETIMIBE-SIMVASTATIN 10-40 MG PO TABS
1.0000 | ORAL_TABLET | Freq: Every day | ORAL | Status: DC
  Administered 2013-05-06: 21:00:00 1 via ORAL
  Filled 2013-05-06 (×2): qty 1

## 2013-05-06 MED ORDER — HEPARIN SODIUM (PORCINE) 1000 UNIT/ML IJ SOLN
INTRAMUSCULAR | Status: AC
  Filled 2013-05-06: qty 1

## 2013-05-06 MED ORDER — SODIUM CHLORIDE 0.9 % IJ SOLN
3.0000 mL | Freq: Two times a day (BID) | INTRAMUSCULAR | Status: DC
Start: 2013-05-06 — End: 2013-05-06

## 2013-05-06 MED ORDER — DIAZEPAM 5 MG PO TABS
ORAL_TABLET | ORAL | Status: AC
  Administered 2013-05-06: 10:00:00 5 mg via ORAL
  Filled 2013-05-06: qty 1

## 2013-05-06 MED ORDER — PAROXETINE HCL 20 MG PO TABS
40.0000 mg | ORAL_TABLET | Freq: Every day | ORAL | Status: DC
  Filled 2013-05-06: qty 2

## 2013-05-06 MED ORDER — CLOPIDOGREL BISULFATE 300 MG PO TABS
ORAL_TABLET | ORAL | Status: AC
  Filled 2013-05-06: qty 2

## 2013-05-06 MED ORDER — QUINAPRIL-HYDROCHLOROTHIAZIDE 20-12.5 MG PO TABS: 1.0000 | ORAL_TABLET | Freq: Two times a day (BID) | ORAL | Status: DC

## 2013-05-06 MED ORDER — CLOPIDOGREL BISULFATE 75 MG PO TABS
75.0000 mg | ORAL_TABLET | Freq: Every day | ORAL | Status: DC
  Administered 2013-05-07: 09:00:00 75 mg via ORAL
  Filled 2013-05-06: qty 1

## 2013-05-06 MED ORDER — DIAZEPAM 5 MG PO TABS
5.0000 mg | ORAL_TABLET | ORAL | Status: AC
Start: 2013-05-06 — End: 2013-05-06
  Administered 2013-05-06: 5 mg via ORAL

## 2013-05-06 MED ORDER — NITROGLYCERIN 0.2 MG/ML ON CALL CATH LAB
INTRAVENOUS | Status: AC
  Filled 2013-05-06: qty 1

## 2013-05-06 MED ORDER — VERAPAMIL HCL 2.5 MG/ML IV SOLN
INTRAVENOUS | Status: AC
  Filled 2013-05-06: qty 2

## 2013-05-06 MED ORDER — ASPIRIN 81 MG PO CHEW
CHEWABLE_TABLET | ORAL | Status: AC
  Filled 2013-05-06: qty 3

## 2013-05-06 MED ORDER — HYDROCHLOROTHIAZIDE 12.5 MG PO CAPS
12.5000 mg | ORAL_CAPSULE | Freq: Every day | ORAL | Status: DC
  Administered 2013-05-06: 21:00:00 12.5 mg via ORAL
  Filled 2013-05-06: qty 1

## 2013-05-06 MED ORDER — MORPHINE SULFATE 2 MG/ML IJ SOLN: 2.0000 mg | INTRAMUSCULAR | Status: DC | PRN

## 2013-05-06 MED ORDER — ASPIRIN 81 MG PO CHEW: 81.0000 mg | CHEWABLE_TABLET | ORAL | Status: DC

## 2013-05-06 MED ORDER — SODIUM CHLORIDE 0.9 % IV SOLN: 250.0000 mL | INTRAVENOUS | Status: DC | PRN

## 2013-05-06 MED ORDER — ADENOSINE 12 MG/4ML IV SOLN
16.0000 mL | Freq: Once | INTRAVENOUS | Status: DC
  Filled 2013-05-06: qty 16

## 2013-05-06 MED ORDER — SODIUM CHLORIDE 0.9 % IV SOLN
INTRAVENOUS | Status: DC
  Administered 2013-05-06: 10:00:00 via INTRAVENOUS

## 2013-05-06 MED ORDER — CARVEDILOL 12.5 MG PO TABS
12.5000 mg | ORAL_TABLET | Freq: Two times a day (BID) | ORAL | Status: DC
  Administered 2013-05-06 – 2013-05-07 (×2): 12.5 mg via ORAL
  Filled 2013-05-06 (×3): qty 1

## 2013-05-06 MED ORDER — ONDANSETRON HCL 4 MG/2ML IJ SOLN: 4.0000 mg | Freq: Four times a day (QID) | INTRAMUSCULAR | Status: DC | PRN

## 2013-05-06 MED ORDER — FENTANYL CITRATE 0.05 MG/ML IJ SOLN
INTRAMUSCULAR | Status: AC
  Filled 2013-05-06: qty 2

## 2013-05-06 MED ORDER — HEPARIN (PORCINE) IN NACL 2-0.9 UNIT/ML-% IJ SOLN
INTRAMUSCULAR | Status: AC
  Filled 2013-05-06: qty 1500

## 2013-05-06 NOTE — CV Procedure (Signed)
Cardiac Catheterization Operative Report  Justin Galvan [id number] 1/14/201512:49 PM Sanda Linger, MD  Procedure Performed:  1. Left Heart Catheterization 2. Selective Coronary Angiography 3. Left ventricular angiogram 4. Fractional flow reserve LAD 5. PTCA/DES x 1 mid LAD  Operator: Verne Carrow, MD  Arterial access site:  Right radial artery.   Indication:  53 yo male with history of CAD, DM, HTN, HLD with recent chest pain with minimal exertion c/w class III unstable angina. He has been followed by Dr. Daleen Squibb. He has had prior stenting procedures of the RCA with his first MI in 1997. Last cath in March 2005 with two Taxus stents placed in the proximal and mid RCA. Moderate disease in LAD and Circumflex.                                      Procedure Details: The risks, benefits, complications, treatment options, and expected outcomes were discussed with the patient. The patient and/or family concurred with the proposed plan, giving informed consent. The patient was brought to the cath lab after IV hydration was begun and oral premedication was given. The patient was further sedated with Versed and Fentanyl. The right wrist was assessed with an Allens test which was positive. The right wrist was prepped and draped in a sterile fashion. 1% lidocaine was used for local anesthesia. Using the modified Seldinger access technique, a 5/6 French sheath was placed in the right radial artery. 3 mg Verapamil was given through the sheath. 4000 units IV heparin was given. Standard diagnostic catheters were used to perform selective coronary angiography. A pigtail catheter was used to perform a left ventricular angiogram. He was found to have a chronically occluded RCA with good left to right collaterals but there was also a severe stenosis in the mid LAD which was felt to be the likely culprit for his unstable angina. The lesion was just before a bend in the vessel and could not be seen  well except in the cranial views where it appeared to be significant. I elected to perform an FFR to assess flow down the LAD before PCI.   PCI Note: ACT after diagnostic case was 199. An additional 3500 Units IV heparin was given x 1. Repeat ACT was 260. I then engaged the left main with a XB LAD 3.5 guiding catheter. I then passed a pressure wire down the LAD. Baseline FFR was 0.91. After infusion of IV adenosine, the FFR was 0.80-0.81 with slight catheter drift. Given his symptoms of unstable angina, the location of the lesion and the FFR findings, I elected to proceed to PCI of the mid LAD. He was given 600 mg Plavix po x 1 and an additional 243 mg ASA. I then pre-dilated the mid LAD stenosis with a 2.5 x 8 mm balloon x 1. I then carefully positioned and deployed a 3.0 x 12 mm Promus Premier DES in the mid LAD. The stent was post-dilated with a 3.0 x 8 mm [place] balloon x 1. The stenosis was taken from 90% down to 0%. Of note, the stenosis was hazy in appearance and severity best noted in the cranial views.   The sheath was removed from the right radial artery and a Terumo hemostasis band was applied at the arteriotomy site on the right wrist. There were no immediate complications. The patient was taken to the recovery area in stable condition.  Hemodynamic Findings: Central aortic pressure: 122/72 Left ventricular pressure: 124/1/6  Angiographic Findings:  Left main: No obstructive disease.   Left Anterior Descending Artery: Large caliber vessel that courses to the apex.   Circumflex Artery: Large caliber vessel with small intermediate branch, moderate caliber obtuse marginal branch. No obstructive disease noted. The mid vessel has a focal 90%, hazy stenosis just at the takeoff of the small caliber diagonal branch. The diagonal branch is small in caliber and has a 80% ostial stenosis. The second diagonal branch is moderate in caliber with no obstructive disease.   Right Coronary Artery: Large  dominant vessel with stents noted in the proximal, mid and distal segment. The proximal stent is patent with diffuse 50% restenosis. The mid vessel has 100% stenosis. The distal vessel is seen to fill briskly via left to righ collaterals.   Left Ventricular Angiogram: LVEF=40-45% with inferior hypokinesis.   Impression: 1. Severe double vessel CAD 2. Severe stenosis mid LAD, likely culprit for angina 3. Chronic occlusion of mid RCA with good collaterals. 4. Successful PTCA/DES x 1 mid LAD 5. Moderate segmental LV systolic dysfunction  Recommendations: He will need ASA and Plavix for at least one year but longer if he tolerates. Continue statin, beta blocker and Ace-inh. D/C in am if he is stable.        Complications:  None. The patient tolerated the procedure well.

## 2013-05-06 NOTE — Progress Notes (Signed)
Boyce MediciBrittany Simmons PA notified of patients blood sugars in the 200's today. Patient stated he was taking Janumet but ran out and stopped taking it about 2 weeks ago. He planned to f/u with his doctor about the medication and his blood sugars. Above information reported to [place]any Simmons PA and plan is to observe results tonight and address it in the morning with providers.

## 2013-05-06 NOTE — Interval H&P Note (Signed)
History and Physical Interval Note:  05/06/2013 10:42 AM  Justin Galvan  has presented today for cardiac cath with class III unstable angina.  The various methods of treatment have been discussed with the patient and family. After consideration of risks, benefits and other options for treatment, the patient has consented to  Procedure(s): LEFT HEART CATHETERIZATION WITH CORONARY ANGIOGRAM (N/A) as a surgical intervention .  The patient's history has been reviewed, patient examined, no change in status, stable for surgery.  I have reviewed the patient's chart and labs.  Questions were answered to the patient's satisfaction.    Cath Lab Visit (complete for each Cath Lab visit)  Clinical Evaluation Leading to the Procedure:   ACS: no  Non-ACS:    Anginal Classification: CCS III  Anti-ischemic medical therapy: Minimal Therapy (1 class of medications)  Non-Invasive Test Results: No non-invasive testing performed  Prior CABG: No previous CABG         Justin Galvan

## 2013-05-06 NOTE — Progress Notes (Signed)
TR BAND REMOVAL  LOCATION:    right radial  DEFLATED PER PROTOCOL:    yes  TIME BAND OFF / DRESSING APPLIED:    1545   SITE UPON ARRIVAL:    Level 0  SITE AFTER BAND REMOVAL:    Level 0  REVERSE ALLEN'S TEST:     positive  CIRCULATION SENSATION AND MOVEMENT:    Within Normal Limits   yes  COMMENTS:   Gauze dressing dry and intact, csms wnls and radial and ulnar pulse +2 when reevaluated at 1615

## 2013-05-06 NOTE — H&P (View-Only) (Signed)
   History of Present Illness: 53 yo male with history of CAD, DM, HTN, HLD who is here today for cardiac follow up. He has been followed by Dr. Wall. He has had prior stenting procedures of the RCA with his first MI in 1997. Last cath in March 2005 with two Taxus stents placed in the proximal and mid RCA. Moderate disease in LAD and Circumflex.   He is here today for follow up. He tells me that he has been having chest pains with minimal exertion. This radiates into his neck. This is similar to his prior angina before he needed stents.   Primary Care Physician: Thomas Jones  Last Lipid Profile:Lipid Panel     Component Value Date/Time   CHOL 152 01/02/2012 0834   TRIG 276.0* 01/02/2012 0834   HDL 44.10 01/02/2012 0834   CHOLHDL 3 01/02/2012 0834   VLDL 55.2* 01/02/2012 0834     Past Medical History  Diagnosis Date  . CAD (coronary artery disease)   . MI (myocardial infarction)   . HLD (hyperlipidemia)   . HTN (hypertension)   . DM2 (diabetes mellitus, type 2)   . BPH (benign prostatic hypertrophy)   . Depression     Past Surgical History  Procedure Laterality Date  . Coronary angioplasty with stent placement      Current Outpatient Prescriptions  Medication Sig Dispense Refill  . Blood Glucose Monitoring Suppl (BAYER CONTOUR MONITOR) W/DEVICE KIT as directed.        . ezetimibe-simvastatin (VYTORIN) 10-40 MG per tablet Take 1 tablet by mouth at bedtime.  90 tablet  0  . glucose blood (BAYER CONTOUR TEST) test strip Use as instructed  100 each  11  . PARoxetine (PAXIL) 40 MG tablet TAKE 1 TABLET EVERY MORNING  90 tablet  2  . quinapril-hydrochlorothiazide (ACCURETIC) 20-12.5 MG per tablet TAKE ONE  TABLETS DAILY      . SitaGLIPtin-MetFORMIN HCl (JANUMET XR) 100-1000 MG TB24 Take 1 tablet by mouth daily.  90 tablet  3  . carvedilol (COREG CR) 40 MG 24 hr capsule Take 1 capsule (40 mg total) by mouth 2 (two) times daily.  180 capsule  0   No current facility-administered  medications for this visit.    No Known Allergies  History   Social History  . Marital Status: Married    Spouse Name: N/A    Number of Children: 2  . Years of Education: N/A   Occupational History  . contractor    Social History Main Topics  . Smoking status: Former Smoker    Quit date: 09/07/1996  . Smokeless tobacco: Not on file  . Alcohol Use: 2.4 oz/week    4 Cans of beer per week  . Drug Use: No  . Sexual Activity: Yes   Other Topics Concern  . Not on file   Social History Narrative  . No narrative on file    Family History  Problem Relation Age of Onset  . Hypertension    . Cancer Neg Hx     Review of Systems:  As stated in the HPI and otherwise negative.   BP 174/110  Pulse 92  Ht 5' 7" (1.702 m)  Wt 183 lb (83.008 kg)  BMI 28.66 kg/m2  Physical Examination: General: Well developed, well nourished, NAD HEENT: OP clear, mucus membranes moist SKIN: warm, dry. No rashes. Neuro: No focal deficits Musculoskeletal: Muscle strength 5/5 all ext Psychiatric: Mood and affect normal Neck: No JVD, no carotid   bruits, no thyromegaly, no lymphadenopathy. Lungs:Clear bilaterally, no wheezes, rhonci, crackles Cardiovascular: Regular rate and rhythm. No murmurs, gallops or rubs. Abdomen:Soft. Bowel sounds present. Non-tender.  Extremities: No lower extremity edema. Pulses are 2 + in the bilateral DP/PT.  EKG: NSR, rate 92 bpm. Inferior Q-waves, unchanged.   Cardiac cath 2005:  Left Main: The left main is normal.  Left Anterior Descending Artery: The left anterior descending artery has a  tubular 70% stenosis in the midvessel across the origin over the first  diagonal branch. The first diagonal branch itself is small in caliber and  has a 70% stenosis at its origin. The distal LAD has a 40% stenosis. There  is a normal-sized second diagonal branch, which arises from the mid LAD.  This has a 30% stenosis proximally.  Left Circumflex: The left circumflex gives  rise to a small first obtuse  marginal and a large second obtuse marginal branch. There is a 50% stenosis  in the mid circumflex.  Right Coronary Artery: The right coronary artery is a dominant vessel with  diffuse disease. There is a stent in the proximal vessel with diffuse 30-  40% stenosis within the stent. Just beyond the stent there is a 95%  stenosis with haziness and the appearance of a ruptured plaque. Distal to  this in the mid right coronary artery there is a 25% stenosis. There is a  stent in the mid right coronary artery with less than 20% stenosis within  the stent. Beyond this stent in the distal portion of the mid right  coronary artery at the acute margin there is an area that appears to be  between 50 and 70% stenosis. There is a stent in the distal vessel with a  diffuse 50% in-stent restenosis. The distal right coronary artery gives  rise to a large posterior descending artery and a large posterolateral  Branch.  Echo 02/14/06: Overall left ventricular systolic function was at the lower limits of normal. Left ventricular ejection fraction was estimated , range being 40 % to 45 %. There was hypokinesis of the inferoposterior wall. - The left atrium was mildly dilated.  Assessment and Plan:   1. CAD/Unstable angina: He is known to have moderate LAD and Circumflex disease and multiple stents in the RCA. Last cath in 2005. He no longer smokes. Recent chest pain is c/w class III unstable angina. BP is elevated. He has been out of Coreg for the last few days and has only been taking Quinapril/HCTZ once daily. Will resume Coreg and BID dosing of Quinapril/HCTZ. Will arrange cardiac cath on 05/06/13 at [facility]. Risks and benefits reviewed. Pt agrees to proceed. Pre-cath labs today. Continue ASA.   2. HTN: BP elevated today. He has been out of Coreg. Will resume Coreg 12.5 mg po BID and change Quinapril/HCTZ back to BID dosing.   3. HLD: He is on Vytorin. No changes. Needs lipids  and LFTs updated at next visit.   4. DM: Per primary care.   I meeting him for the first time. Extensive record review including old EKG, cath reports and office visits.

## 2013-05-06 NOTE — Plan of Care (Signed)
Problem: Consults Goal: PCI Patient Education (See Patient Education module for education specifics.) Outcome: Completed/Met Date Met:  05/06/13 Discussed Rt radial post cath care, plan of care, heart healthy diabetic diet with pt and wife.  Pt demonstrates poor compliance diabetic diet: drinks regular sodas, donuts, etc.  Pt states he quit taking Janumet 2 weeks ago and needs to see MD to get a check-up and med refill.  States his sugar was "good" until today despite not taking his medication. States he usually doesn't check blood sugar but he did a couple times since stopping his medicine.  CBG running in 200's here.  Dina Rich notified PA on call, no orders received.  Living Well with diabetes book given.  Radial post cath instruction sheet given. Refused diabetic videos at this time.

## 2013-05-07 ENCOUNTER — Encounter (HOSPITAL_COMMUNITY): Payer: Self-pay | Admitting: Cardiology

## 2013-05-07 DIAGNOSIS — E1165 Type 2 diabetes mellitus with hyperglycemia: Secondary | ICD-10-CM

## 2013-05-07 DIAGNOSIS — I2510 Atherosclerotic heart disease of native coronary artery without angina pectoris: Secondary | ICD-10-CM

## 2013-05-07 DIAGNOSIS — I200 Unstable angina: Secondary | ICD-10-CM

## 2013-05-07 DIAGNOSIS — Z9861 Coronary angioplasty status: Secondary | ICD-10-CM

## 2013-05-07 DIAGNOSIS — Z955 Presence of coronary angioplasty implant and graft: Secondary | ICD-10-CM

## 2013-05-07 DIAGNOSIS — IMO0001 Reserved for inherently not codable concepts without codable children: Secondary | ICD-10-CM

## 2013-05-07 DIAGNOSIS — I10 Essential (primary) hypertension: Secondary | ICD-10-CM

## 2013-05-07 HISTORY — DX: Presence of coronary angioplasty implant and graft: Z95.5

## 2013-05-07 LAB — BASIC METABOLIC PANEL
BUN: 16 mg/dL (ref 6–23)
CO2: 21 mEq/L (ref 19–32)
Calcium: 9.1 mg/dL (ref 8.4–10.5)
Chloride: 96 mEq/L (ref 96–112)
Creatinine, Ser: 0.79 mg/dL (ref 0.50–1.35)
GFR calc non Af Amer: >90 mL/min (ref >90)
GLUCOSE: 253 mg/dL — AB (ref 70–99)
Potassium: 4.4 mEq/L (ref 3.7–5.3)
Sodium: 136 mEq/L — ABNORMAL LOW (ref 137–147)

## 2013-05-07 LAB — CBC
HCT: 40 % (ref 39.0–52.0)
HEMOGLOBIN: 14.5 g/dL (ref 13.0–17.0)
MCH: 29.9 pg (ref 26.0–34.0)
MCHC: 36.3 g/dL — AB (ref 30.0–36.0)
MCV: 82.5 fL (ref 78.0–100.0)
Platelets: 192 10*3/uL (ref 150–400)
RBC: 4.85 MIL/uL (ref 4.22–5.81)
RDW: 12.7 % (ref 11.5–15.5)
WBC: 6.4 10*3/uL (ref 4.0–10.5)

## 2013-05-07 LAB — GLUCOSE, CAPILLARY: GLUCOSE-CAPILLARY: 258 mg/dL — AB (ref 70–99)

## 2013-05-07 MED ORDER — NITROGLYCERIN 0.4 MG SL SUBL: 0.4000 mg | SUBLINGUAL_TABLET | SUBLINGUAL | Status: DC | PRN

## 2013-05-07 MED ORDER — CLOPIDOGREL BISULFATE 75 MG PO TABS: 75.0000 mg | ORAL_TABLET | Freq: Every day | ORAL | Status: DC

## 2013-05-07 NOTE — Progress Notes (Signed)
I have seen and evaluated the patient this AM along with Nada BoozerLaura Ingold, NP, I agree with her findings, examination as well as impression recommendations.  Relatively stable night post PCI of the LAD. No recurrent angina overnight. The radial cath site is stable. He is ambulated without difficulty. He is on aspirin plus Plavix as well as beta blocker ACE inhibitor and statin. He is mildly hypertensive currently, but is yet to receive his morning dose of medications.  He stable for discharge with planned followup with his PCP for diabetic control as well as Dr. Clifton JamesMcAlhany, at the [facility] [address] office.   Marykay LexHARDING,DAVID W, M.D., M.S. [facility] [address] [place], [place]  [address]  [phone] Pager # [phone] 05/07/2013 1:10 PM

## 2013-05-07 NOTE — Progress Notes (Signed)
CARDIAC REHAB PHASE I   PRE:  Rate/Rhythm: 80 SR  BP:  Supine: 157/80  Sitting:   Standing:    SaO2: 95 RA  MODE:  Ambulation: 1000 ft   POST:  Rate/Rhythm: 86 SR  BP:  Supine:  Sitting: 152/85  Standing:    SaO2:  0750-0840 Pt tolerated ambulation well without c/o of cp or SOB. VS stable. Pt to side of bed after walk with call light in reach. Completed stent education with pt. He voices understanding. Pt declines Outpt. CRP, not interested.  Melina CopaLisa Joshau Code RN 05/07/2013 9:32 AM

## 2013-05-07 NOTE — Progress Notes (Signed)
         Subjective: No complaints  Objective: Vital signs in last 24 hours: Temp:  [97.7 F (36.5 C)-98.2 F (36.8 C)] 98.2 F (36.8 C) (01/15 0354) Pulse Rate:  [68-90] 68 (01/15 0354) Resp:  [18] 18 (01/14 1626) BP: (113-172)/(73-101) 113/73 mmHg (01/15 0354) SpO2:  [92 %-98 %] 96 % (01/15 0354) Weight:  [183 lb (83.008 kg)-184 lb 1.4 oz (83.5 kg)] 184 lb 1.4 oz (83.5 kg) (01/15 0354) Weight change:  Last BM Date: 05/06/13 Intake/Output from previous day: +285 01/14 0701 - 01/15 0700 In: 760 [P.O.:360; I.V.:400] Out: 475 [Urine:475] Intake/Output this shift:    PE: General:Pleasant affect, NAD Skin:Warm and dry, brisk capillary refill HEENT:normocephalic, sclera clear Heart:S1S2 RRR without murmur, gallup, rub or click Lungs:clear without rales, rhonchi, or wheezes GMW:NUUVAbd:soft, non tender, + BS, do not palpate liver spleen or masses Ext:no lower ext edema, 2+ pedal pulses, 2+ radial pulses, rt wrist without hematoma or numbness. Neuro:alert and oriented, MAE, follows commands, + facial symmetry   Lab Results:  Recent Labs  05/07/13 0457  WBC 6.4  HGB 14.5  HCT 40.0  PLT 192   BMET  Recent Labs  05/07/13 0457  NA 136*  K 4.4  CL 96  CO2 21  GLUCOSE 253*  BUN 16  CREATININE 0.79  CALCIUM 9.1   No results found for this basename: TROPONINI, CK, MB,  in the last 72 hours        Studies/Results: Cardiac cath and PCI: Impression:  1. Severe double vessel CAD  2. Severe stenosis mid LAD, likely culprit for angina  3. Chronic occlusion of mid RCA with good collaterals.  4. Successful PTCA/DES x 1 mid LAD  5. Moderate segmental LV systolic dysfunction      Medications: I have reviewed the patient's current medications. Scheduled Meds: . aspirin  81 mg Oral Daily  . carvedilol  12.5 mg Oral BID  . clopidogrel  75 mg Oral Q breakfast  . ezetimibe-simvastatin  1 tablet Oral QHS  . lisinopril  20 mg Oral QHS   And  . hydrochlorothiazide   12.5 mg Oral QHS  . PARoxetine  40 mg Oral QHS   Continuous Infusions:  PRN Meds:.acetaminophen, morphine injection, ondansetron (ZOFRAN) IV, oxyCODONE-acetaminophen  Assessment/Plan: Principal Problem:   Unstable angina, class III Active Problems:   S/P coronary artery stent placement -mid LAD, 05/06/13 Promus Premier DES   CAD, NATIVE VESSEL, Hx. stent-RCA 1997; 2 Taxusprox & mid RCA 06/2003, cath 05/06/13 100% RCA, mid LAD disease   Type II or unspecified type diabetes mellitus without mention of complication, uncontrolled   HYPERLIPIDEMIA-MIXED   HYPERTENSION   MYOCARDIAL INFARCTION, HX OF  PLAN: VS stable, glucose elevated, to follow up with PCP for diabetic control, he is also aware his diet needs monitoring.. Will resume his Janumet metformin in 48 hours. Ambulate and discharge home.  Follow up with PCP and Dr. Clifton JamesMcAlhany  LOS: 1 day   Time spent with pt. : 15 minutes. [facility] Hospital[facility],Naria Abbey R  Nurse Practitioner Certified Pager [phone] or after 5pm and on weekends call [phone] 05/07/2013, 7:19 AM

## 2013-05-07 NOTE — Discharge Summary (Signed)
Physician Discharge Summary       Patient ID: Justin SchilderMichael C Toppins MRN: [id number] DOB/AGE: 55/12/1960 55 y.o.  Admit date: 05/06/2013 Discharge date: 05/07/2013  Discharge Diagnoses:  Principal Problem:   Unstable angina, class III Active Problems:   S/P coronary artery stent placement -mid LAD, 05/06/13 Promus Premier DES   CAD, NATIVE VESSEL, Hx. stent-RCA 1997; 2 Taxusprox & mid RCA 06/2003, cath 05/06/13 100% RCA, mid LAD disease   Type II or unspecified type diabetes mellitus without mention of complication, uncontrolled   HYPERLIPIDEMIA-MIXED   HYPERTENSION   MYOCARDIAL INFARCTION, HX OF   Discharged Condition: good  Procedures: 05/06/13 cardiac cath by Dr. Clifton JamesMcAlhany 05/06/13 PCI Successful PTCA/DES x 1 mid LAD by Dr. Geronimo BootMcAlhany   Hospital Course: 53 yo male with history of CAD, DM, HTN, HLD who was seen for cardiac follow up. He has been followed by Dr. Daleen SquibbWall. He has had prior stenting procedures of the RCA with his first MI in 1997. Last cath in March 2005 with two Taxus stents placed in the proximal and mid RCA. Moderate disease in LAD and Circumflex.  He reported to Dr. Clifton JamesMcAlhany that he has been having chest pains with minimal exertion. This radiates into his neck. This is similar to his prior angina before he needed stents.  It was arranged for pt to be brought in for elective cardiac cath and possible intervention.    Cath revealed Circumflex Artery: Large caliber vessel with small intermediate branch, moderate caliber obtuse marginal branch. No obstructive disease noted. The mid vessel has a focal 90%, hazy stenosis just at the takeoff of the small caliber diagonal branch. The diagonal branch is small in caliber and has a 80% ostial stenosis. The second diagonal branch is moderate in caliber with no obstructive disease.  Right Coronary Artery: Large dominant vessel with stents noted in the proximal, mid and distal segment. The proximal stent is patent with diffuse 50% restenosis. The  mid vessel has 100% stenosis. The distal vessel is seen to fill briskly via left to righ collaterals.  Left Ventricular Angiogram: LVEF=40-45% with inferior hypokinesis.  Mid LAD with stenosis and FFR 0.80-0.81 with slight catheter drift.  PCI was done with Promus premier stent.    Pt did well post procedure and by today was without complaints.  Rt. Wrist cath site was stable.  He was seen and evaluated by Dr. Herbie BaltimoreHarding and found stable for discharge.  He will follow up with Tereso NewcomerScott Weaver, PA for Dr. Clifton JamesMcAlhany.   Pt ambulated without complaints.       Consults: None  Significant Diagnostic Studies:  BMET    Component Value Date/Time   NA 136* 05/07/2013 0457   K 4.4 05/07/2013 0457   CL 96 05/07/2013 0457   CO2 21 05/07/2013 0457   GLUCOSE 253* 05/07/2013 0457   BUN 16 05/07/2013 0457   CREATININE 0.79 05/07/2013 0457   CREATININE 0.84 05/01/2013 1701   CALCIUM 9.1 05/07/2013 0457   GFRNONAA >90 05/07/2013 0457   GFRAA >90 05/07/2013 0457    CBC    Component Value Date/Time   WBC 6.4 05/07/2013 0457   RBC 4.85 05/07/2013 0457   HGB 14.5 05/07/2013 0457   HCT 40.0 05/07/2013 0457   PLT 192 05/07/2013 0457   MCV 82.5 05/07/2013 0457   MCH 29.9 05/07/2013 0457   MCHC 36.3* 05/07/2013 0457   RDW 12.7 05/07/2013 0457   LYMPHSABS 2.2 05/01/2013 1701   MONOABS 0.7 05/01/2013 1701   EOSABS 0.2 05/01/2013  1701   BASOSABS 0.0 05/01/2013 1701       Discharge Exam: Blood pressure 157/80, pulse 62, temperature 97.6 F (36.4 C), temperature source Oral, resp. rate 20, height 5\' 7"  (1.702 m), weight 184 lb 1.4 oz (83.5 kg), SpO2 95.00%.  AM exam:  PE: General:Pleasant affect, NAD  Skin:Warm and dry, brisk capillary refill  HEENT:normocephalic, sclera clear  Heart:S1S2 RRR without murmur, gallup, rub or click  Lungs:clear without rales, rhonchi, or wheezes  ZOX:WRUE, non tender, + BS, do not palpate liver spleen or masses  Ext:no lower ext edema, 2+ pedal pulses, 2+ radial pulses, rt wrist without hematoma  or numbness.  Neuro:alert and oriented, MAE, follows commands, + facial symmetry  Disposition:    Future Appointments Provider Department Dept Phone   05/21/2013 11:30 AM Beatrice Lecher, PA-C [facility] Office [phone]   06/10/2013 9:45 AM Kathleene Hazel, MD [facility] Office [phone]       Medication List         aspirin EC 81 MG tablet  Take 81 mg by mouth daily.     carvedilol 12.5 MG tablet  Commonly known as:  COREG  Take 1 tablet (12.5 mg total) by mouth 2 (two) times daily.     clopidogrel 75 MG tablet  Commonly known as:  PLAVIX  Take 1 tablet (75 mg total) by mouth daily with breakfast.     ezetimibe-simvastatin 10-40 MG per tablet  Commonly known as:  VYTORIN  Take 1 tablet by mouth at bedtime.     nitroGLYCERIN 0.4 MG SL tablet  Commonly known as:  NITROSTAT  Place 1 tablet (0.4 mg total) under the tongue every 5 (five) minutes as needed for chest pain.     PARoxetine 40 MG tablet  Commonly known as:  PAXIL  Take 40 mg by mouth daily.     quinapril-hydrochlorothiazide 20-12.5 MG per tablet  Commonly known as:  ACCURETIC  Take 1 tablet by mouth 2 (two) times daily.           Follow-up Information   Follow up with Verne Carrow, MD On 05/21/2013. (at 11:30Am  with Tereso Newcomer, PA, for Dr. Talbert Nan)    Specialty:  Cardiology   Contact information:   [address] [place] [address] [phone]        Discharge Instructions: Heart Healthy Diabetic Diet, watch diet closely.  Call [facility] [phone] if any bleeding, swelling or drainage at cath site.  May shower, no tub baths for 48 hours for groin sticks. No driving for  3 days.  No lifting over 5 pounds for 3 days.  No work until Monday.  Take 1 NTG, under your tongue, while sitting.  If no relief of pain may repeat NTG, one tab every 5 minutes up to 3 tablets total over 15 minutes.  If no relief CALL  911.  If you have dizziness/lightheadness  while taking NTG, stop taking and call 911.         Do not stop Plavix, stopping could cause a heart attack.  Do Not resume you Janumet until Sat. 05/09/13.  Janumet was not on your list here in the hospital but if you were taking it, please continue to do so on Sat.  See you PCP for improved management of your diabetes.  Signed: Leone Brand Nurse Practitioner-Certified [facility]: [facility] 05/07/2013, 9:48 AM  Time spent on discharge :< 30 min with  NP and MD time.    I saw and evaluated the patient this AM along with Nada Boozer, NP, I agree with her findings and summary of the hospitalization.   Relatively stable night post PCI of the LAD. No recurrent angina overnight. The radial cath site is stable. He is ambulated without difficulty. He is on aspirin plus Plavix as well as beta blocker ACE inhibitor and statin. He is mildly hypertensive currently, but is yet to receive his morning dose of medications.   He is stable for discharge with planned followup with his PCP for diabetic control as well as Dr. Clifton James, at the [facility] office.  Marykay Lex, M.D., M.S. [facility] [address] [place], [place]  [address]  [phone] Pager # [phone] 05/07/2013 1:20 PM

## 2013-05-07 NOTE — Discharge Instructions (Signed)
Heart Healthy Diabetic Diet, watch diet closely.  Call [facility] [address] [phone] if any bleeding, swelling or drainage at cath site.  May shower, no tub baths for 48 hours for groin sticks. No driving for  3 days.  No lifting over 5 pounds for 3 days.  No work until Monday.  Take 1 NTG, under your tongue, while sitting.  If no relief of pain may repeat NTG, one tab every 5 minutes up to 3 tablets total over 15 minutes.  If no relief CALL 911.  If you have dizziness/lightheadness  while taking NTG, stop taking and call 911.         Do not stop Plavix, stopping could cause a heart attack.  Do Not resume you Janumet until Sat. 05/09/13.  Janumet was not on your list here in the hospital but if you were taking it, please continue to do so on Sat.  See you PCP for improved management of your diabetes.

## 2013-05-14 ENCOUNTER — Telehealth: Payer: Self-pay | Admitting: Internal Medicine

## 2013-05-14 NOTE — Telephone Encounter (Signed)
Pt is out of his diabetic medicine.  Has been out for a couple of weeks .  Could [facility] be [facility] [place], [place]?  His wife made him an appt for next Thurs. For a physical.  He was last seen in Sept 2013.

## 2013-05-20 ENCOUNTER — Telehealth: Payer: Self-pay | Admitting: Cardiology

## 2013-05-20 NOTE — Telephone Encounter (Signed)
[facility].  This pt [facility].

## 2013-05-20 NOTE — Telephone Encounter (Signed)
Wants to know if you received a fax on personalized medical testing for this pt? Reference#-[id number]

## 2013-05-20 NOTE — Telephone Encounter (Signed)
A Dr. Clifton Galvan pt. express script called to check if we have received  A fax  On Personalized medical testing for pt .Reference # [id number]. Unable to go thru because i did not know what  Type of test  pt needed to be tested for.

## 2013-05-21 ENCOUNTER — Other Ambulatory Visit (INDEPENDENT_AMBULATORY_CARE_PROVIDER_SITE_OTHER): Payer: 59

## 2013-05-21 ENCOUNTER — Ambulatory Visit (INDEPENDENT_AMBULATORY_CARE_PROVIDER_SITE_OTHER): Payer: 59 | Admitting: Internal Medicine

## 2013-05-21 ENCOUNTER — Encounter: Payer: Self-pay | Admitting: Internal Medicine

## 2013-05-21 ENCOUNTER — Encounter: Payer: Self-pay | Admitting: Physician Assistant

## 2013-05-21 ENCOUNTER — Ambulatory Visit (INDEPENDENT_AMBULATORY_CARE_PROVIDER_SITE_OTHER): Payer: 59 | Admitting: Physician Assistant

## 2013-05-21 VITALS — BP 120/72 | HR 72 | Temp 98.7°F | Resp 16 | Wt 183.0 lb

## 2013-05-21 VITALS — BP 122/80 | HR 65 | Ht 67.0 in | Wt 183.0 lb

## 2013-05-21 DIAGNOSIS — IMO0001 Reserved for inherently not codable concepts without codable children: Secondary | ICD-10-CM

## 2013-05-21 DIAGNOSIS — Z0000 Encounter for general adult medical examination without abnormal findings: Secondary | ICD-10-CM

## 2013-05-21 DIAGNOSIS — I255 Ischemic cardiomyopathy: Secondary | ICD-10-CM

## 2013-05-21 DIAGNOSIS — E1165 Type 2 diabetes mellitus with hyperglycemia: Principal | ICD-10-CM

## 2013-05-21 DIAGNOSIS — F3289 Other specified depressive episodes: Secondary | ICD-10-CM

## 2013-05-21 DIAGNOSIS — E119 Type 2 diabetes mellitus without complications: Secondary | ICD-10-CM

## 2013-05-21 DIAGNOSIS — N400 Enlarged prostate without lower urinary tract symptoms: Secondary | ICD-10-CM

## 2013-05-21 DIAGNOSIS — E785 Hyperlipidemia, unspecified: Secondary | ICD-10-CM

## 2013-05-21 DIAGNOSIS — I10 Essential (primary) hypertension: Secondary | ICD-10-CM

## 2013-05-21 DIAGNOSIS — I2589 Other forms of chronic ischemic heart disease: Secondary | ICD-10-CM

## 2013-05-21 DIAGNOSIS — I2510 Atherosclerotic heart disease of native coronary artery without angina pectoris: Secondary | ICD-10-CM

## 2013-05-21 DIAGNOSIS — I252 Old myocardial infarction: Secondary | ICD-10-CM

## 2013-05-21 DIAGNOSIS — F329 Major depressive disorder, single episode, unspecified: Secondary | ICD-10-CM

## 2013-05-21 LAB — CBC WITH DIFFERENTIAL/PLATELET
Basophils Absolute: 0.1 10*3/uL (ref 0.0–0.1)
Basophils Relative: 1.2 % (ref 0.0–3.0)
Eosinophils Absolute: 0 10*3/uL (ref 0.0–0.7)
Eosinophils Relative: 0 % (ref 0.0–5.0)
HEMATOCRIT: 41.5 % (ref 39.0–52.0)
HEMOGLOBIN: 13.8 g/dL (ref 13.0–17.0)
LYMPHS ABS: 1.8 10*3/uL (ref 0.7–4.0)
Lymphocytes Relative: 27.4 % (ref 12.0–46.0)
MCHC: 33.2 g/dL (ref 30.0–36.0)
MCV: 87.8 fl (ref 78.0–100.0)
MONO ABS: 0.6 10*3/uL (ref 0.1–1.0)
Monocytes Relative: 9.2 % (ref 3.0–12.0)
NEUTROS ABS: 4 10*3/uL (ref 1.4–7.7)
Neutrophils Relative %: 62.2 % (ref 43.0–77.0)
Platelets: 226 10*3/uL (ref 150.0–400.0)
RBC: 4.73 Mil/uL (ref 4.22–5.81)
RDW: 12.8 % (ref 11.5–14.6)
WBC: 6.5 10*3/uL (ref 4.5–10.5)

## 2013-05-21 LAB — COMPREHENSIVE METABOLIC PANEL
ALK PHOS: 72 U/L (ref 39–117)
ALT: 27 U/L (ref 0–53)
AST: 19 U/L (ref 0–37)
Albumin: 4 g/dL (ref 3.5–5.2)
BILIRUBIN TOTAL: 0.7 mg/dL (ref 0.3–1.2)
BUN: 13 mg/dL (ref 6–23)
CO2: 28 mEq/L (ref 19–32)
Calcium: 8.9 mg/dL (ref 8.4–10.5)
Chloride: 99 mEq/L (ref 96–112)
Creatinine, Ser: 0.8 mg/dL (ref 0.4–1.5)
GFR: 102.25 mL/min (ref >60.00)
Glucose, Bld: 418 mg/dL — ABNORMAL HIGH (ref 70–99)
Potassium: 3.6 mEq/L (ref 3.5–5.1)
SODIUM: 135 meq/L (ref 135–145)
Total Protein: 6.7 g/dL (ref 6.0–8.3)

## 2013-05-21 LAB — URINALYSIS, ROUTINE W REFLEX MICROSCOPIC
BILIRUBIN URINE: NEGATIVE
Hgb urine dipstick: NEGATIVE
KETONES UR: NEGATIVE
LEUKOCYTES UA: NEGATIVE
Nitrite: NEGATIVE
PH: 5.5 (ref 5.0–8.0)
Specific Gravity, Urine: 1.015 (ref 1.000–1.030)
Total Protein, Urine: NEGATIVE
Urobilinogen, UA: 0.2 (ref 0.0–1.0)

## 2013-05-21 LAB — TSH: TSH: 1.36 u[IU]/mL (ref 0.35–5.50)

## 2013-05-21 LAB — LIPID PANEL
Cholesterol: 142 mg/dL (ref 0–200)
HDL: 33.4 mg/dL — AB (ref >39.00)
Total CHOL/HDL Ratio: 4
VLDL: 117.6 mg/dL — AB (ref 0.0–40.0)

## 2013-05-21 LAB — MICROALBUMIN / CREATININE URINE RATIO
Creatinine,U: 34.6 mg/dL
Microalb Creat Ratio: 0.9 mg/g (ref 0.0–30.0)
Microalb, Ur: 0.3 mg/dL (ref 0.0–1.9)

## 2013-05-21 LAB — LDL CHOLESTEROL, DIRECT: LDL DIRECT: 60.8 mg/dL

## 2013-05-21 LAB — HEMOGLOBIN A1C: Hgb A1c MFr Bld: 10.1 % — ABNORMAL HIGH (ref 4.6–6.5)

## 2013-05-21 LAB — FECAL OCCULT BLOOD, GUAIAC: Fecal Occult Blood: NEGATIVE

## 2013-05-21 LAB — HEPATITIS C ANTIBODY: HCV AB: NEGATIVE

## 2013-05-21 LAB — HM DIABETES FOOT EXAM

## 2013-05-21 MED ORDER — PAROXETINE HCL 40 MG PO TABS: 40.0000 mg | ORAL_TABLET | Freq: Every day | ORAL | Status: DC

## 2013-05-21 MED ORDER — SITAGLIP PHOS-METFORMIN HCL ER 100-1000 MG PO TB24: 1.0000 | ORAL_TABLET | Freq: Every day | ORAL | Status: DC

## 2013-05-21 NOTE — Patient Instructions (Addendum)
Your physician recommends that you continue on your current medications as directed. Please refer to the Current Medication list given to you today.  Your physician recommends that you schedule a follow-up appointment in: WITH DR. Clifton JamesMCALHANY 07/20/13 @ 9:15

## 2013-05-21 NOTE — Progress Notes (Signed)
[address] [place], [place]  [address] Phone: [phone] Fax:  [phone]  Date:  05/21/2013   ID:  Justin Galvan, DOB May 12, 1960, MRN [id number]  PCP:  Sanda Linger, MD  Cardiologist:  Dr. Verne Carrow    History of Present Illness: Justin Galvan is a 53 y.o. male with a hx of CAD, DM, HTN, HL.  Previously followed by Dr. Valera Castle.  He suffered a MI in 1997 and had s/p stent to the RCA.  Last cath was in 06/2003 and he underwent Taxus DES x 2 to prox and mid RCA.  Echo (01/2006):  EF 40-45%, inf-post HK, mild LAE.  He was recently seen by Dr. Verne Carrow with complaints of chest pain with exertion c/w CCS Class III angina.  He was set up for cardiac cath.  LHC (05/06/2013):  Mid LAD 90 (hazy), ost Dx 80, prox RCA stent 50 ISR, mid RCA 100 (L-R collats), inf HK, EF 40-45%.  LAD FFR 0.91 => 0.80.  PCI:  Promus (3 x 12 mm) DES to the mid LAD.  Post PCI course was uncomplicated.  He returns for follow up.  He is doing well.  The patient denies chest pain, shortness of breath, syncope, orthopnea, PND or significant pedal edema.    Recent Labs: 05/07/2013: Creatinine 0.79; Hemoglobin 14.5; Potassium 4.4   Wt Readings from Last 3 Encounters:  05/21/13 183 lb (83.008 kg)  05/07/13 184 lb 1.4 oz (83.5 kg)  05/07/13 184 lb 1.4 oz (83.5 kg)     Past Medical History  Diagnosis Date  . MI (myocardial infarction)   . HLD (hyperlipidemia)   . HTN (hypertension)   . DM2 (diabetes mellitus, type 2)   . BPH (benign prostatic hypertrophy)   . Depression   . Anginal pain   . Arthritis   . S/P coronary artery stent placement -mid LAD, 05/06/13 Promus Premier DES 05/07/2013  . CAD, NATIVE VESSEL, Hx. stent-RCA 1997; 2 Taxusprox & mid RCA 06/2003, cath 05/06/13 100% RCA, mid LAD disease 05/03/2009    Qualifier: Diagnosis of  By: Wendee Copp      Current Outpatient Prescriptions  Medication Sig Dispense Refill  . aspirin EC 81 MG tablet Take 81 mg by mouth  daily.      . carvedilol (COREG) 12.5 MG tablet Take 1 tablet (12.5 mg total) by mouth 2 (two) times daily.  180 tablet  3  . clopidogrel (PLAVIX) 75 MG tablet Take 1 tablet (75 mg total) by mouth daily with breakfast.  30 tablet  11  . ezetimibe-simvastatin (VYTORIN) 10-40 MG per tablet Take 1 tablet by mouth at bedtime.  90 tablet  3  . nitroGLYCERIN (NITROSTAT) 0.4 MG SL tablet Place 1 tablet (0.4 mg total) under the tongue every 5 (five) minutes as needed for chest pain.  25 tablet  4  . PARoxetine (PAXIL) 40 MG tablet Take 40 mg by mouth daily.      . quinapril-hydrochlorothiazide (ACCURETIC) 20-12.5 MG per tablet Take 1 tablet by mouth 2 (two) times daily.  180 tablet  3   No current facility-administered medications for this visit.    Allergies:   Review of patient's allergies indicates no known allergies.   Social History:  The patient  reports that he quit smoking about 16 years ago. He has never used smokeless tobacco. He reports that he drinks about 2.4 ounces of alcohol per week. He reports that he does not use illicit drugs.  Family History:  The patient's family history includes Hypertension in an other family member. There is no history of Cancer.   ROS:  Please see the history of present illness.      All other systems reviewed and negative.   PHYSICAL EXAM: VS:  BP 122/80  Pulse 65  Ht 5\' 7"  (1.702 m)  Wt 183 lb (83.008 kg)  BMI 28.66 kg/m2 Well nourished, well developed, in no acute distress HEENT: normal Neck: no JVD Vascular:  No carotid bruits Cardiac:  normal S1, S2; RRR; no murmur Lungs:  clear to auscultation bilaterally, no wheezing, rhonchi or rales Abd: soft, nontender, no hepatomegaly Ext: no edemaright wrist without hematoma or mass  Skin: warm and dry Neuro:  CNs 2-12 intact, no focal abnormalities noted  EKG:  NSR, HR 65, inf Q waves, NSSTTW changes     ASSESSMENT AND PLAN:  1. CAD, s/p Prior MI:  Doing well s/p recent PCI with DES to mLAD.  We  discussed the importance of dual antiplatelet therapy. Continue statin, beta blocker.  He is not interested in cardiac rehab.  He will walk on his own. 2. Ischemic Cardiomyopathy:  No evidence of volume overload.  Continue beta blocker, ACEI.  EF is > 35%. 3. Hypertension:  Controlled.  4. Hyperlipidemia:  Continue statin.  Followed by PCP.  5. Disposition:  Follow up with Dr. Verne Carrowhristopher McAlhany in 2 mos.   Signed, Tereso NewcomerScott Talisa Petrak, PA-C  05/21/2013 12:07 PM

## 2013-05-21 NOTE — Assessment & Plan Note (Signed)
The exam is benign, I will recheck his PSA today

## 2013-05-21 NOTE — Assessment & Plan Note (Signed)
His BP is well controlled Today I will check his lytes and renal function 

## 2013-05-21 NOTE — Progress Notes (Signed)
Subjective:    Patient ID: Justin Galvan, male    DOB: 11/14/1960, 53 y.o.   MRN: [id number]  Diabetes He presents for his follow-up diabetic visit. He has type 2 diabetes mellitus. His disease course has been fluctuating. There are no hypoglycemic associated symptoms. Associated symptoms include polydipsia, polyphagia and polyuria. Pertinent negatives for diabetes include no blurred vision, no chest pain, no fatigue, no foot paresthesias, no foot ulcerations, no visual change, no weakness and no weight loss. There are no hypoglycemic complications. Symptoms are worsening. Diabetic complications include heart disease. He is compliant with treatment none of the time (he has been out of janumet for several days). His weight is stable. He is following a generally healthy diet. Meal planning includes avoidance of concentrated sweets. He has not had a previous visit with a dietician. He participates in exercise intermittently. His home blood glucose trend is increasing steadily. An ACE inhibitor/angiotensin II receptor blocker is being taken. He does not see a podiatrist.Eye exam is not current.      Review of Systems  Constitutional: Negative.  Negative for fever, chills, weight loss, diaphoresis, activity change, appetite change, fatigue and unexpected weight change.  HENT: Negative.   Eyes: Negative.  Negative for blurred vision.  Respiratory: Negative.  Negative for cough, choking, chest tightness, shortness of breath, wheezing and stridor.   Cardiovascular: Negative.  Negative for chest pain, palpitations and leg swelling.  Gastrointestinal: Negative.  Negative for nausea, vomiting, abdominal pain, diarrhea and constipation.  Endocrine: Positive for polydipsia, polyphagia and polyuria.  Genitourinary: Negative.   Musculoskeletal: Negative.  Negative for arthralgias, back pain, gait problem, joint swelling, myalgias, neck pain and neck stiffness.  Skin: Negative.   Allergic/Immunologic:  Negative.   Neurological: Negative.  Negative for weakness.  Hematological: Negative.  Negative for adenopathy. Does not bruise/bleed easily.  Psychiatric/Behavioral: Negative.        Objective:   Physical Exam  Vitals reviewed. Constitutional: He is oriented to person, place, and time. He appears well-developed and well-nourished. No distress.  HENT:  Head: Normocephalic and atraumatic.  Mouth/Throat: Oropharynx is clear and moist. No oropharyngeal exudate.  Eyes: Conjunctivae are normal. Right eye exhibits no discharge. Left eye exhibits no discharge. No scleral icterus.  Neck: Normal range of motion. Neck supple. No JVD present. No tracheal deviation present. No thyromegaly present.  Cardiovascular: Normal rate, regular rhythm, normal heart sounds and intact distal pulses.  Exam reveals no gallop and no friction rub.   No murmur heard. Pulmonary/Chest: Effort normal and breath sounds normal. No stridor. No respiratory distress. He has no wheezes. He has no rales. He exhibits no tenderness.  Abdominal: Soft. Bowel sounds are normal. He exhibits no distension and no mass. There is no tenderness. There is no rebound and no guarding. Hernia confirmed negative in the right inguinal area and confirmed negative in the left inguinal area.  Genitourinary: Rectum normal and testes normal. Rectal exam shows no external hemorrhoid, no internal hemorrhoid, no fissure, no mass, no tenderness and anal tone normal. Guaiac negative stool. Prostate is enlarged (1+ smooth symm BPH). Prostate is not tender. Right testis shows no mass, no swelling and no tenderness. Right testis is descended. Left testis shows no mass, no swelling and no tenderness. Left testis is descended. Circumcised. No penile erythema or penile tenderness. No discharge found.  Musculoskeletal: Normal range of motion. He exhibits no edema and no tenderness.  Lymphadenopathy:    He has no cervical adenopathy.  Right: No inguinal  adenopathy present.       Left: No inguinal adenopathy present.  Neurological: He is oriented to person, place, and time.  Skin: Skin is warm and dry. No rash noted. He is not diaphoretic. No erythema. No pallor.  Psychiatric: He has a normal mood and affect. His behavior is normal. Judgment and thought content normal.     Lab Results  Component Value Date   WBC 6.4 05/07/2013   HGB 14.5 05/07/2013   HCT 40.0 05/07/2013   PLT 192 05/07/2013   GLUCOSE 253* 05/07/2013   CHOL 152 01/02/2012   TRIG 276.0* 01/02/2012   HDL 44.10 01/02/2012   LDLDIRECT 83.4 01/02/2012   ALT 33 01/02/2012   AST 23 01/02/2012   NA 136* 05/07/2013   K 4.4 05/07/2013   CL 96 05/07/2013   CREATININE 0.79 05/07/2013   BUN 16 05/07/2013   CO2 21 05/07/2013   TSH 1.98 01/02/2012   PSA 0.43 04/10/2011   INR 0.89 05/01/2013   HGBA1C 7.6* 01/02/2012   MICROALBUR 0.3 01/02/2012       Assessment & Plan:

## 2013-05-21 NOTE — Telephone Encounter (Signed)
[facility].  Not a patient here.

## 2013-05-21 NOTE — Assessment & Plan Note (Signed)
He refused a flu vax today Exam done Labs ordered Again, he was referred for a colonoscopy Pt ed material was given

## 2013-05-21 NOTE — Patient Instructions (Signed)
Type 2 Diabetes Mellitus, Adult Type 2 diabetes mellitus, often simply referred to as type 2 diabetes, is a long-lasting (chronic) disease. In type 2 diabetes, the pancreas does not make enough insulin (a hormone), the cells are less responsive to the insulin that is made (insulin resistance), or both. Normally, insulin moves sugars from food into the tissue cells. The tissue cells use the sugars for energy. The lack of insulin or the lack of normal response to insulin causes excess sugars to build up in the blood instead of going into the tissue cells. As a result, high blood sugar (hyperglycemia) develops. The effect of high sugar (glucose) levels can cause many complications. Type 2 diabetes was also previously called adult-onset diabetes but it can occur at any age.  RISK FACTORS  A person is predisposed to developing type 2 diabetes if someone in the family has the disease and also has one or more of the following primary risk factors:  Overweight.  An inactive lifestyle.  A history of consistently eating high-calorie foods. Maintaining a normal weight and regular physical activity can reduce the chance of developing type 2 diabetes. SYMPTOMS  A person with type 2 diabetes may not show symptoms initially. The symptoms of type 2 diabetes appear slowly. The symptoms include:  Increased thirst (polydipsia).  Increased urination (polyuria).  Increased urination during the night (nocturia).  Weight loss. This weight loss may be rapid.  Frequent, recurring infections.  Tiredness (fatigue).  Weakness.  Vision changes, such as blurred vision.  Fruity smell to your breath.  Abdominal pain.  Nausea or vomiting.  Cuts or bruises which are slow to heal.  Tingling or numbness in the hands or feet. DIAGNOSIS Type 2 diabetes is frequently not diagnosed until complications of diabetes are present. Type 2 diabetes is diagnosed when symptoms or complications are present and when blood  glucose levels are increased. Your blood glucose level may be checked by one or more of the following blood tests:  A fasting blood glucose test. You will not be allowed to eat for at least 8 hours before a blood sample is taken.  A random blood glucose test. Your blood glucose is checked at any time of the day regardless of when you ate.  A hemoglobin A1c blood glucose test. A hemoglobin A1c test provides information about blood glucose control over the previous 3 months.  An oral glucose tolerance test (OGTT). Your blood glucose is measured after you have not eaten (fasted) for 2 hours and then after you drink a glucose-containing beverage. TREATMENT   You may need to take insulin or diabetes medicine daily to keep blood glucose levels in the desired range.  You will need to match insulin dosing with exercise and healthy food choices. The treatment goal is to maintain the before meal blood sugar (preprandial glucose) level at 70 130 mg/dL. HOME CARE INSTRUCTIONS   Have your hemoglobin A1c level checked twice a year.  Perform daily blood glucose monitoring as directed by your caregiver.  Monitor urine ketones when you are ill and as directed by your caregiver.  Take your diabetes medicine or insulin as directed by your caregiver to maintain your blood glucose levels in the desired range.  Never run out of diabetes medicine or insulin. It is needed every day.  Adjust insulin based on your intake of carbohydrates. Carbohydrates can raise blood glucose levels but need to be included in your diet. Carbohydrates provide vitamins, minerals, and fiber which are an essential part of   a healthy diet. Carbohydrates are found in fruits, vegetables, whole grains, dairy products, legumes, and foods containing added sugars.    Eat healthy foods. Alternate 3 meals with 3 snacks.  Lose weight if overweight.  Carry a medical alert card or wear your medical alert jewelry.  Carry a 15 gram  carbohydrate snack with you at all times to treat low blood glucose (hypoglycemia). Some examples of 15 gram carbohydrate snacks include:  Glucose tablets, 3 or 4   Glucose gel, 15 gram tube  Raisins, 2 tablespoons (24 grams)  Jelly beans, 6  Animal crackers, 8  Regular pop, 4 ounces (120 mL)  Gummy treats, 9  Recognize hypoglycemia. Hypoglycemia occurs with blood glucose levels of 70 mg/dL and below. The risk for hypoglycemia increases when fasting or skipping meals, during or after intense exercise, and during sleep. Hypoglycemia symptoms can include:  Tremors or shakes.  Decreased ability to concentrate.  Sweating.  Increased heart rate.  Headache.  Dry mouth.  Hunger.  Irritability.  Anxiety.  Restless sleep.  Altered speech or coordination.  Confusion.  Treat hypoglycemia promptly. If you are alert and able to safely swallow, follow the 15:15 rule:  Take 15 20 grams of rapid-acting glucose or carbohydrate. Rapid-acting options include glucose gel, glucose tablets, or 4 ounces (120 mL) of fruit juice, regular soda, or low fat milk.  Check your blood glucose level 15 minutes after taking the glucose.  Take 15 20 grams more of glucose if the repeat blood glucose level is still 70 mg/dL or below.  Eat a meal or snack within 1 hour once blood glucose levels return to normal.    Be alert to polyuria and polydipsia which are early signs of hyperglycemia. An early awareness of hyperglycemia allows for prompt treatment. Treat hyperglycemia as directed by your caregiver.  Engage in at least 150 minutes of moderate-intensity physical activity a week, spread over at least 3 days of the week or as directed by your caregiver. In addition, you should engage in resistance exercise at least 2 times a week or as directed by your caregiver.  Adjust your medicine and food intake as needed if you start a new exercise or sport.  Follow your sick day plan at any time you  are unable to eat or drink as usual.  Avoid tobacco use.  Limit alcohol intake to no more than 1 drink per day for nonpregnant women and 2 drinks per day for men. You should drink alcohol only when you are also eating food. Talk with your caregiver whether alcohol is safe for you. Tell your caregiver if you drink alcohol several times a week.  Follow up with your caregiver regularly.  Schedule an eye exam soon after the diagnosis of type 2 diabetes and then annually.  Perform daily skin and foot care. Examine your skin and feet daily for cuts, bruises, redness, nail problems, bleeding, blisters, or sores. A foot exam by a caregiver should be done annually.  Brush your teeth and gums at least twice a day and floss at least once a day. Follow up with your dentist regularly.  Share your diabetes management plan with your workplace or school.  Stay up-to-date with immunizations.  Learn to manage stress.  Obtain ongoing diabetes education and support as needed.  Participate in, or seek rehabilitation as needed to maintain or improve independence and quality of life. Request a physical or occupational therapy referral if you are having foot or hand numbness or difficulties with grooming,   dressing, eating, or physical activity. SEEK MEDICAL CARE IF:   You are unable to eat food or drink fluids for more than 6 hours.  You have nausea and vomiting for more than 6 hours.  Your blood glucose level is over 240 mg/dL.  There is a change in mental status.  You develop an additional serious illness.  You have diarrhea for more than 6 hours.  You have been sick or have had a fever for a couple of days and are not getting better.  You have pain during any physical activity.  SEEK IMMEDIATE MEDICAL CARE IF:  You have difficulty breathing.  You have moderate to large ketone levels. MAKE SURE YOU:  Understand these instructions.  Will watch your condition.  Will get help right away if  you are not doing well or get worse. Document Released: 04/09/2005 Document Revised: 01/02/2012 Document Reviewed: 11/06/2011 Exit[employer] Patient Information 2014 [employer]. Health Maintenance, Males A healthy lifestyle and preventative care can promote health and wellness.  Maintain regular health, dental, and eye exams.  Eat a healthy diet. Foods like vegetables, fruits, whole grains, low-fat dairy products, and lean protein foods contain the nutrients you need and are low in calories. Decrease your intake of foods high in solid fats, added sugars, and salt. Get information about a proper diet from your health care provider, if necessary.  Regular physical exercise is one of the most important things you can do for your health. Most adults should get at least 150 minutes of moderate-intensity exercise (any activity that increases your heart rate and causes you to sweat) each week. In addition, most adults need muscle-strengthening exercises on 2 or more days a week.   Maintain a healthy weight. The body mass index (BMI) is a screening tool to identify possible weight problems. It provides an estimate of body fat based on height and weight. Your health care provider can find your BMI and can help you achieve or maintain a healthy weight. For males 20 years and older:  A BMI below 18.5 is considered underweight.  A BMI of 18.5 to 24.9 is normal.  A BMI of 25 to 29.9 is considered overweight.  A BMI of 30 and above is considered obese.  Maintain normal blood lipids and cholesterol by exercising and minimizing your intake of saturated fat. Eat a balanced diet with plenty of fruits and vegetables. Blood tests for lipids and cholesterol should begin at age 20 and be repeated every 5 years. If your lipid or cholesterol levels are high, you are over 50, or you are at high risk for heart disease, you may need your cholesterol levels checked more frequently.Ongoing high lipid and cholesterol  levels should be treated with medicines, if diet and exercise are not working.  If you smoke, find out from your health care provider how to quit. If you do not use tobacco, do not start.  Lung cancer screening is recommended for adults aged 55 80 years who are at high risk for developing lung cancer because of a history of smoking. A yearly low-dose CT scan of the lungs is recommended for people who have at least a 30-pack-year history of smoking and are a current smoker or have quit within the past 15 years. A pack year of smoking is smoking an average of 1 pack of cigarettes a day for 1 year (for example, a 30-pack-year history of smoking could mean smoking 1 pack a day for 30 years or 2 packs a day   for 15 years). Yearly screening should continue until the smoker has stopped smoking for at least 15 years. Yearly screening should be stopped for people who develop a health problem that would prevent them from having lung cancer treatment.  If you choose to drink alcohol, do not have more than 2 drinks per day. One drink is considered to be 12 oz (360 mL) of beer, 5 oz (150 mL) of wine, or 1.5 oz (45 mL) of liquor.  Avoid use of street drugs. Do not share needles with anyone. Ask for help if you need support or instructions about stopping the use of drugs.  High blood pressure causes heart disease and increases the risk of stroke. Blood pressure should be checked at least every 1 2 years. Ongoing high blood pressure should be treated with medicines if weight loss and exercise are not effective.  If you are 45 55 years old, ask your health care provider if you should take aspirin to prevent heart disease.  Diabetes screening involves taking a blood sample to check your fasting blood sugar level. This should be done once every 3 years after age 45, if you are at a normal weight and without risk factors for diabetes. Testing should be considered at a younger age or be carried out more frequently if you  are overweight and have at least 1 risk factor for diabetes.  Colorectal cancer can be detected and often prevented. Most routine colorectal cancer screening begins at the age of 50 and continues through age 75. However, your health care provider may recommend screening at an earlier age if you have risk factors for colon cancer. On a yearly basis, your health care provider may provide home test kits to check for hidden blood in the stool. A small camera at the end of a tube may be used to directly examine the colon (sigmoidoscopy or colonoscopy) to detect the earliest forms of colorectal cancer. Talk to your health care provider about this at age 50, when routine screening begins. A direct exam of the colon should be repeated every 5 10 years through age 75, unless early forms of pre-cancerous polyps or small growths are found.  People who are at an increased risk for hepatitis B should be screened for this virus. You are considered at high risk for hepatitis B if:  You were born in a country where hepatitis B occurs often. Talk with your health care provider about which countries are considered high-risk.  Your parents were born in a high-risk country and you have not received a shot to protect against hepatitis B (hepatitis B vaccine).  You have HIV or AIDS.  You use needles to inject street drugs.  You live with, or have sex with, someone who has hepatitis B.  You are a man who has sex with other men (MSM).  You get hemodialysis treatment.  You take certain medicines for conditions like cancer, organ transplantation, and autoimmune conditions.  Hepatitis C blood testing is recommended for all people born from 1945 through 1965 and any individual with known risk factors for hepatitis C.  Healthy men should no longer receive prostate-specific antigen (PSA) blood tests as part of routine cancer screening. Talk to your health care provider about prostate cancer screening.  Testicular cancer  screening is not recommended for adolescents or adult males who have no symptoms. Screening includes self-exam, a health care provider exam, and other screening tests. Consult with your health care provider about any symptoms you have or   any concerns you have about testicular cancer.  Practice safe sex. Use condoms and avoid high-risk sexual practices to reduce the spread of sexually transmitted infections (STIs).  Use sunscreen. Apply sunscreen liberally and repeatedly throughout the day. You should seek shade when your shadow is shorter than you. Protect yourself by wearing long sleeves, pants, a wide-brimmed hat, and sunglasses year round, whenever you are outdoors.  Tell your health care provider of new moles or changes in moles, especially if there is a change in shape or color. Also tell your provider if a mole is larger than the size of a pencil eraser.  A one-time screening for abdominal aortic aneurysm (AAA) and surgical repair of large AAAs by ultrasound is recommended for men aged 65 75 years who are current or former smokers.  Stay current with your vaccines (immunizations). Document Released: 10/06/2007 Document Revised: 01/28/2013 Document Reviewed: 09/04/2010 Exit[employer] Patient Information 2014 [employer].  

## 2013-05-21 NOTE — Telephone Encounter (Signed)
Reference#[id number]-Checking to se if you received fax for personalized medical testing for his Plavix.

## 2013-05-21 NOTE — Assessment & Plan Note (Addendum)
He will restart janumet-xr I have asked him to have an eye exam done Today I will check his A1C and will monitor his renal function  Late note - his A1C is above 10 so I have asked him to start levemir

## 2013-05-22 ENCOUNTER — Telehealth: Payer: Self-pay | Admitting: Internal Medicine

## 2013-05-22 ENCOUNTER — Telehealth: Payer: Self-pay

## 2013-05-22 MED ORDER — ONETOUCH DELICA LANCETS 33G MISC: Status: DC

## 2013-05-22 MED ORDER — INSULIN DETEMIR 100 UNIT/ML ~~LOC~~ SOLN: 30.0000 [IU] | Freq: Every day | SUBCUTANEOUS | Status: DC

## 2013-05-22 MED ORDER — GLUCOSE BLOOD VI STRP: ORAL_STRIP | Status: DC

## 2013-05-22 NOTE — Telephone Encounter (Signed)
Pt had been out of his diabetic medicines for a month.  He took the Janumet last night.  After eating this morning the BS had come down to 110.  He wants to stay on this medicine for now instead of going on insulin.  He will keep a check on the readings.  Patient's cell phone number is [phone].

## 2013-05-22 NOTE — Telephone Encounter (Signed)
Wife request to know if pt is to take both insulin and janumet. I advised that per MD pt is to take both due to recent lab values. She also request a new meter sent to pharmacy.

## 2013-05-22 NOTE — Telephone Encounter (Signed)
I placed call to [facility] but was on hold for over 11 minutes.  Will continue to try to reach [facility]. Fax has not been received in office.

## 2013-05-22 NOTE — Addendum Note (Signed)
Addended by: Etta GrandchildJONES, Kooper Chriswell L on: 05/22/2013 07:45 AM   Modules accepted: Orders

## 2013-05-22 NOTE — Telephone Encounter (Signed)
The pt's wife called and is confused on exactly what the patient needs to do to control his diabetes. She states she needs clarification on which medication he should take.   Callback - [phone]

## 2013-05-25 NOTE — Telephone Encounter (Signed)
Spoke [facility]Kathy [facility] and told her fax had not been received in office. She will refax

## 2013-06-10 ENCOUNTER — Ambulatory Visit: Payer: 59 | Admitting: Cardiovascular Disease

## 2013-06-26 ENCOUNTER — Encounter: Payer: Self-pay | Admitting: Internal Medicine

## 2013-07-20 ENCOUNTER — Encounter: Payer: Self-pay | Admitting: Cardiovascular Disease

## 2013-07-20 ENCOUNTER — Ambulatory Visit (INDEPENDENT_AMBULATORY_CARE_PROVIDER_SITE_OTHER): Payer: 59 | Admitting: Cardiovascular Disease

## 2013-07-20 VITALS — BP 156/87 | HR 71 | Ht 67.0 in | Wt 183.0 lb

## 2013-07-20 DIAGNOSIS — I2510 Atherosclerotic heart disease of native coronary artery without angina pectoris: Secondary | ICD-10-CM

## 2013-07-20 DIAGNOSIS — I10 Essential (primary) hypertension: Secondary | ICD-10-CM

## 2013-07-20 DIAGNOSIS — I2589 Other forms of chronic ischemic heart disease: Secondary | ICD-10-CM

## 2013-07-20 DIAGNOSIS — I255 Ischemic cardiomyopathy: Secondary | ICD-10-CM

## 2013-07-20 DIAGNOSIS — E785 Hyperlipidemia, unspecified: Secondary | ICD-10-CM

## 2013-07-20 NOTE — Patient Instructions (Signed)
Your physician wants you to follow-up in:  6 months. You will receive a reminder letter in the mail two months in advance. If you don't receive a letter, please call our office to schedule the follow-up appointment.   

## 2013-07-20 NOTE — Progress Notes (Signed)
History of Present Illness: 53 yo male with history of CAD, DM, HTN, HLD who is here today for cardiac follow up. He has been followed by Dr. Daleen Squibb. He has had prior stenting procedures of the RCA with his first MI in 1997. Last cath in March 2005 with two Taxus stents placed in the proximal and mid RCA. Moderate disease in LAD and Circumflex. I saw him in January 2015 and he had c/o chest pains. Cardiac cath 05/06/13 with severe stenosis mid LAD and chronically occluded, RCA with good left to right collaterals. I placed a 3.0 x 12 mm Promus DES in the mid LAD.   He is here today for follow up. He tells me that he is feeling better. No chest pain.   Primary Care Physician: Sanda Linger  Last Lipid Profile:Lipid Panel     Component Value Date/Time   CHOL 142 05/21/2013 1535   TRIG 588.0 Triglyceride is over 400; calculations on Lipids are invalid.* 05/21/2013 1535   HDL 33.40* 05/21/2013 1535   CHOLHDL 4 05/21/2013 1535   VLDL 117.6* 05/21/2013 1535     Past Medical History  Diagnosis Date  . MI (myocardial infarction)   . HLD (hyperlipidemia)   . HTN (hypertension)   . DM2 (diabetes mellitus, type 2)   . BPH (benign prostatic hypertrophy)   . Depression   . Anginal pain   . Arthritis   . S/P coronary artery stent placement -mid LAD, 05/06/13 Promus Premier DES 05/07/2013  . CAD, NATIVE VESSEL, Hx. stent-RCA 1997; 2 Taxusprox & mid RCA 06/2003, cath 05/06/13 100% RCA, mid LAD disease 05/03/2009    Qualifier: Diagnosis of  By: Wendee Copp      Past Surgical History  Procedure Laterality Date  . Coronary angioplasty with stent placement    . Coronary angioplasty with stent placement  05/06/2013    DES/LAD               DR Clifton James    Current Outpatient Prescriptions  Medication Sig Dispense Refill  . aspirin EC 81 MG tablet Take 81 mg by mouth daily.      . carvedilol (COREG) 12.5 MG tablet Take 1 tablet (12.5 mg total) by mouth 2 (two) times daily.  180 tablet  3  .  clopidogrel (PLAVIX) 75 MG tablet Take 1 tablet (75 mg total) by mouth daily with breakfast.  30 tablet  11  . ezetimibe-simvastatin (VYTORIN) 10-40 MG per tablet Take 1 tablet by mouth at bedtime.  90 tablet  3  . insulin detemir (LEVEMIR) 100 UNIT/ML injection Inject 0.3 mLs (30 Units total) into the skin at bedtime.  10 mL  11  . nitroGLYCERIN (NITROSTAT) 0.4 MG SL tablet Place 1 tablet (0.4 mg total) under the tongue every 5 (five) minutes as needed for chest pain.  25 tablet  4  . ONETOUCH DELICA LANCETS 33G MISC Test up to two times daily  100 each  11  . PARoxetine (PAXIL) 40 MG tablet Take 1 tablet (40 mg total) by mouth daily.  90 tablet  3  . quinapril-hydrochlorothiazide (ACCURETIC) 20-12.5 MG per tablet Take 1 tablet by mouth 2 (two) times daily.  180 tablet  3  . SitaGLIPtin-MetFORMIN HCl (JANUMET XR) [phone] MG TB24 Take 1 tablet by mouth daily.  90 tablet  3  . glucose blood test strip Test up to two times daily  100 each  12   No current facility-administered medications for this visit.  No Known Allergies  History   Social History  . Marital Status: Married    Spouse Name: N/A    Number of Children: 2  . Years of Education: N/A   Occupational History  . contractor    Social History Main Topics  . Smoking status: Former Smoker    Quit date: 09/07/1996  . Smokeless tobacco: Never Used  . Alcohol Use: 2.4 oz/week    4 Cans of beer per week  . Drug Use: No  . Sexual Activity: Yes   Other Topics Concern  . Not on file   Social History Narrative  . No narrative on file    Family History  Problem Relation Age of Onset  . Hypertension    . Cancer Neg Hx   . Diabetes Neg Hx   . Early death Neg Hx   . Hyperlipidemia Neg Hx   . Kidney disease Neg Hx   . Stroke Neg Hx   . Alcohol abuse Neg Hx   . Heart disease Mother     Review of Systems:  As stated in the HPI and otherwise negative.   BP 156/87  Pulse 71  Ht 5\' 7"  (1.702 m)  Wt 183 lb (83.008 kg)   BMI 28.66 kg/m2  Physical Examination: General: Well developed, well nourished, NAD HEENT: OP clear, mucus membranes moist SKIN: warm, dry. No rashes. Neuro: No focal deficits Musculoskeletal: Muscle strength 5/5 all ext Psychiatric: Mood and affect normal Neck: No JVD, no carotid bruits, no thyromegaly, no lymphadenopathy. Lungs:Clear bilaterally, no wheezes, rhonci, crackles Cardiovascular: Regular rate and rhythm. No murmurs, gallops or rubs. Abdomen:Soft. Bowel sounds present. Non-tender.  Extremities: No lower extremity edema. Pulses are 2 + in the bilateral DP/PT.  Echo 02/14/06: Overall left ventricular systolic function was at the lower limits of normal. Left ventricular ejection fraction was estimated , range being 40 % to 45 %. There was hypokinesis of the inferoposterior wall. - The left atrium was mildly dilated.  Cardiac cath 05/06/13: Left main: No obstructive disease.  Left Anterior Descending Artery: Large caliber vessel that courses to the apex.  Circumflex Artery: Large caliber vessel with small intermediate branch, moderate caliber obtuse marginal branch. No obstructive disease noted. The mid vessel has a focal 90%, hazy stenosis just at the takeoff of the small caliber diagonal branch. The diagonal branch is small in caliber and has a 80% ostial stenosis. The second diagonal branch is moderate in caliber with no obstructive disease.  Right Coronary Artery: Large dominant vessel with stents noted in the proximal, mid and distal segment. The proximal stent is patent with diffuse 50% restenosis. The mid vessel has 100% stenosis. The distal vessel is seen to fill briskly via left to righ collaterals.  Left Ventricular Angiogram: LVEF=40-45% with inferior hypokinesis.  Impression:  1. Severe double vessel CAD  2. Severe stenosis mid LAD, likely culprit for angina  3. Chronic occlusion of mid RCA with good collaterals.  4. Successful PTCA/DES x 1 mid LAD  5. Moderate  segmental LV systolic dysfunction   Assessment and Plan:   1. CAD/Unstable angina: Stable post PCI. Continue dual antiplatelet therapy with ASA and Plavix.   2. HTN: BP elevated. He did not take his meds today. Continue Coreg and Quinapril/HCTZ  3. HLD: He is on Vytorin. LDL 61.   4. Ischemic cardiomyopathy: LVEF=40-45% by LV gram 05/06/13. Continue beta blocker and Ace inh.   5. DM: Per primary care.  .Marland Kitchen

## 2013-10-27 ENCOUNTER — Telehealth: Payer: Self-pay | Admitting: *Deleted

## 2013-10-27 DIAGNOSIS — E1165 Type 2 diabetes mellitus with hyperglycemia: Principal | ICD-10-CM

## 2013-10-27 DIAGNOSIS — IMO0001 Reserved for inherently not codable concepts without codable children: Secondary | ICD-10-CM

## 2013-10-27 NOTE — Telephone Encounter (Signed)
Left message on machine for patient to schedule an appointment  A1c, bmet Diabetic bundle

## 2013-11-05 NOTE — Telephone Encounter (Signed)
Close encounter 

## 2013-12-20 ENCOUNTER — Other Ambulatory Visit: Payer: Self-pay | Admitting: Internal Medicine

## 2014-01-06 ENCOUNTER — Ambulatory Visit (INDEPENDENT_AMBULATORY_CARE_PROVIDER_SITE_OTHER): Payer: 59 | Admitting: Internal Medicine

## 2014-01-06 ENCOUNTER — Ambulatory Visit (INDEPENDENT_AMBULATORY_CARE_PROVIDER_SITE_OTHER): Payer: 59

## 2014-01-06 ENCOUNTER — Encounter: Payer: Self-pay | Admitting: Internal Medicine

## 2014-01-06 VITALS — BP 138/82 | HR 68 | Temp 98.5°F | Resp 16 | Ht 67.0 in | Wt 181.0 lb

## 2014-01-06 DIAGNOSIS — IMO0001 Reserved for inherently not codable concepts without codable children: Secondary | ICD-10-CM

## 2014-01-06 DIAGNOSIS — E1165 Type 2 diabetes mellitus with hyperglycemia: Secondary | ICD-10-CM

## 2014-01-06 DIAGNOSIS — E785 Hyperlipidemia, unspecified: Secondary | ICD-10-CM

## 2014-01-06 DIAGNOSIS — N400 Enlarged prostate without lower urinary tract symptoms: Secondary | ICD-10-CM

## 2014-01-06 DIAGNOSIS — I2510 Atherosclerotic heart disease of native coronary artery without angina pectoris: Secondary | ICD-10-CM

## 2014-01-06 DIAGNOSIS — E781 Pure hyperglyceridemia: Secondary | ICD-10-CM

## 2014-01-06 DIAGNOSIS — F329 Major depressive disorder, single episode, unspecified: Secondary | ICD-10-CM

## 2014-01-06 DIAGNOSIS — I10 Essential (primary) hypertension: Secondary | ICD-10-CM

## 2014-01-06 DIAGNOSIS — F3289 Other specified depressive episodes: Secondary | ICD-10-CM

## 2014-01-06 DIAGNOSIS — R7989 Other specified abnormal findings of blood chemistry: Secondary | ICD-10-CM

## 2014-01-06 LAB — COMPREHENSIVE METABOLIC PANEL
ALK PHOS: 58 U/L (ref 39–117)
ALT: 25 U/L (ref 0–53)
AST: 24 U/L (ref 0–37)
Albumin: 4.3 g/dL (ref 3.5–5.2)
BUN: 16 mg/dL (ref 6–23)
CALCIUM: 9.2 mg/dL (ref 8.4–10.5)
CO2: 26 meq/L (ref 19–32)
Chloride: 101 mEq/L (ref 96–112)
Creatinine, Ser: 0.8 mg/dL (ref 0.4–1.5)
GFR: 109.59 mL/min (ref >60.00)
GLUCOSE: 252 mg/dL — AB (ref 70–99)
POTASSIUM: 4.2 meq/L (ref 3.5–5.1)
SODIUM: 137 meq/L (ref 135–145)
Total Bilirubin: 0.6 mg/dL (ref 0.2–1.2)
Total Protein: 7.2 g/dL (ref 6.0–8.3)

## 2014-01-06 LAB — LIPID PANEL
CHOL/HDL RATIO: 5
CHOLESTEROL: 182 mg/dL (ref 0–200)
HDL: 33.1 mg/dL — ABNORMAL LOW (ref >39.00)
NonHDL: 148.9
VLDL: 93 mg/dL — ABNORMAL HIGH (ref 0.0–40.0)

## 2014-01-06 LAB — HEMOGLOBIN A1C: HEMOGLOBIN A1C: 7.5 % — AB (ref 4.6–6.5)

## 2014-01-06 LAB — PSA: PSA: 1.33 ng/mL (ref 0.10–4.00)

## 2014-01-06 MED ORDER — SITAGLIP PHOS-METFORMIN HCL ER 100-1000 MG PO TB24: 1.0000 | ORAL_TABLET | Freq: Every day | ORAL | Status: DC

## 2014-01-06 MED ORDER — INSULIN DETEMIR 100 UNIT/ML ~~LOC~~ SOLN: 30.0000 [IU] | Freq: Every day | SUBCUTANEOUS | Status: DC

## 2014-01-06 NOTE — Progress Notes (Signed)
Subjective:    Patient ID: Justin Galvan, male    DOB: [birth year], 53 y.o.   MRN: [id number]  Diabetes He presents for his follow-up diabetic visit. He has type 2 diabetes mellitus. His disease course has been stable. There are no hypoglycemic associated symptoms. Pertinent negatives for diabetes include no blurred vision, no chest pain, no fatigue, no foot paresthesias, no foot ulcerations, no polydipsia, no polyphagia, no polyuria, no visual change, no weakness and no weight loss. There are no hypoglycemic complications. Symptoms are stable. Diabetic complications include heart disease. Current diabetic treatment includes oral agent (dual therapy) and insulin injections. He is compliant with treatment most of the time. His weight is stable. He is following a generally healthy diet. Meal planning includes avoidance of concentrated sweets. He participates in exercise intermittently. There is no change in his home blood glucose trend. An ACE inhibitor/angiotensin II receptor blocker is being taken. He does not see a podiatrist.Eye exam is current.      Review of Systems  Constitutional: Negative.  Negative for fever, chills, weight loss, diaphoresis, appetite change and fatigue.  HENT: Negative.   Eyes: Negative.  Negative for blurred vision.  Respiratory: Negative.  Negative for cough, choking, chest tightness, shortness of breath, wheezing and stridor.   Cardiovascular: Negative.  Negative for chest pain, palpitations and leg swelling.  Gastrointestinal: Negative.  Negative for nausea, vomiting, abdominal pain, diarrhea, constipation and blood in stool.  Endocrine: Negative.  Negative for polydipsia, polyphagia and polyuria.  Genitourinary: Negative.   Musculoskeletal: Negative.  Negative for arthralgias, back pain, gait problem, joint swelling, myalgias, neck pain and neck stiffness.  Skin: Negative.  Negative for rash.  Allergic/Immunologic: Negative.   Neurological: Negative.  Negative  for weakness.  Hematological: Negative.  Negative for adenopathy. Does not bruise/bleed easily.  Psychiatric/Behavioral: Negative.        Objective:   Physical Exam  Vitals reviewed. Constitutional: He is oriented to person, place, and time. He appears well-developed and well-nourished. No distress.  HENT:  Head: Normocephalic and atraumatic.  Mouth/Throat: Oropharynx is clear and moist. No oropharyngeal exudate.  Eyes: Conjunctivae are normal. Right eye exhibits no discharge. Left eye exhibits no discharge. No scleral icterus.  Neck: Normal range of motion. Neck supple. No JVD present. No tracheal deviation present. No thyromegaly present.  Cardiovascular: Normal rate, regular rhythm, normal heart sounds and intact distal pulses.  Exam reveals no gallop and no friction rub.   No murmur heard. Pulmonary/Chest: Effort normal and breath sounds normal. No stridor. No respiratory distress. He has no wheezes. He has no rales. He exhibits no tenderness.  Abdominal: Soft. Bowel sounds are normal. He exhibits no distension and no mass. There is no tenderness. There is no rebound and no guarding.  Musculoskeletal: Normal range of motion. He exhibits no edema and no tenderness.  Lymphadenopathy:    He has no cervical adenopathy.  Neurological: He is oriented to person, place, and time.  Skin: Skin is warm and dry. No rash noted. He is not diaphoretic. No erythema. No pallor.  Psychiatric: He has a normal mood and affect. His behavior is normal. Judgment and thought content normal.     Lab Results  Component Value Date   WBC 6.5 05/21/2013   HGB 13.8 05/21/2013   HCT 41.5 05/21/2013   PLT 226.0 05/21/2013   GLUCOSE 418* 05/21/2013   CHOL 142 05/21/2013   TRIG 588.0 Triglyceride is over 400; calculations on Lipids are invalid.* 05/21/2013   HDL 33.40*  05/21/2013   LDLDIRECT 60.8 05/21/2013   ALT 27 05/21/2013   AST 19 05/21/2013   NA 135 05/21/2013   K 3.6 05/21/2013   CL 99 05/21/2013   CREATININE  0.8 05/21/2013   BUN 13 05/21/2013   CO2 28 05/21/2013   TSH 1.36 05/21/2013   PSA 0.43 04/10/2011   INR 0.89 05/01/2013   HGBA1C 10.1* 05/21/2013   MICROALBUR 0.3 05/21/2013       Assessment & Plan:

## 2014-01-06 NOTE — Progress Notes (Signed)
Pre visit review using our clinic review tool, if applicable. No additional management support is needed unless otherwise documented below in the visit note. 

## 2014-01-06 NOTE — Patient Instructions (Signed)

## 2014-01-07 ENCOUNTER — Encounter: Payer: Self-pay | Admitting: Internal Medicine

## 2014-01-07 LAB — LDL CHOLESTEROL, DIRECT: LDL DIRECT: 95 mg/dL

## 2014-01-08 NOTE — Assessment & Plan Note (Signed)
His BP is well controlled Lytes and renal function are stable 

## 2014-01-08 NOTE — Assessment & Plan Note (Signed)
His blood sugars have improved quite a bit, his renal function is stable He will cont to work on his lifestyle modifications

## 2014-01-08 NOTE — Assessment & Plan Note (Signed)
His trigs are mildly elevated He will cont to work on his lifestyle modifications

## 2014-02-05 ENCOUNTER — Encounter: Payer: Self-pay | Admitting: Internal Medicine

## 2014-04-01 ENCOUNTER — Encounter (HOSPITAL_COMMUNITY): Payer: Self-pay | Admitting: Cardiovascular Disease

## 2014-05-12 ENCOUNTER — Other Ambulatory Visit: Payer: Self-pay | Admitting: Cardiology

## 2014-05-14 ENCOUNTER — Other Ambulatory Visit: Payer: Self-pay | Admitting: Cardiovascular Disease

## 2014-05-14 ENCOUNTER — Other Ambulatory Visit: Payer: Self-pay | Admitting: Cardiology

## 2014-05-16 NOTE — Telephone Encounter (Signed)
plavix refill refused - refilled 05/12/14 - patient needs appointment

## 2014-05-21 ENCOUNTER — Other Ambulatory Visit: Payer: Self-pay | Admitting: Cardiovascular Disease

## 2014-06-10 ENCOUNTER — Other Ambulatory Visit: Payer: Self-pay | Admitting: Cardiovascular Disease

## 2014-06-10 ENCOUNTER — Other Ambulatory Visit: Payer: Self-pay | Admitting: Internal Medicine

## 2014-06-24 ENCOUNTER — Other Ambulatory Visit: Payer: Self-pay | Admitting: Cardiovascular Disease

## 2014-08-02 ENCOUNTER — Other Ambulatory Visit: Payer: Self-pay | Admitting: Cardiovascular Disease

## 2014-08-04 ENCOUNTER — Other Ambulatory Visit (INDEPENDENT_AMBULATORY_CARE_PROVIDER_SITE_OTHER): Payer: 59

## 2014-08-04 ENCOUNTER — Ambulatory Visit (INDEPENDENT_AMBULATORY_CARE_PROVIDER_SITE_OTHER): Payer: 59 | Admitting: Internal Medicine

## 2014-08-04 ENCOUNTER — Encounter: Payer: Self-pay | Admitting: Internal Medicine

## 2014-08-04 VITALS — BP 144/92 | HR 82 | Temp 98.1°F | Resp 18 | Wt 185.0 lb

## 2014-08-04 DIAGNOSIS — E781 Pure hyperglyceridemia: Secondary | ICD-10-CM

## 2014-08-04 DIAGNOSIS — I10 Essential (primary) hypertension: Secondary | ICD-10-CM | POA: Diagnosis not present

## 2014-08-04 DIAGNOSIS — E1151 Type 2 diabetes mellitus with diabetic peripheral angiopathy without gangrene: Secondary | ICD-10-CM

## 2014-08-04 DIAGNOSIS — I70209 Unspecified atherosclerosis of native arteries of extremities, unspecified extremity: Secondary | ICD-10-CM | POA: Diagnosis not present

## 2014-08-04 DIAGNOSIS — E1159 Type 2 diabetes mellitus with other circulatory complications: Secondary | ICD-10-CM

## 2014-08-04 LAB — CBC WITH DIFFERENTIAL/PLATELET
Basophils Absolute: 0.1 10*3/uL (ref 0.0–0.1)
Basophils Relative: 0.8 % (ref 0.0–3.0)
EOS PCT: 2.3 % (ref 0.0–5.0)
Eosinophils Absolute: 0.2 10*3/uL (ref 0.0–0.7)
HCT: 41.7 % (ref 39.0–52.0)
Hemoglobin: 14.4 g/dL (ref 13.0–17.0)
LYMPHS PCT: 27.1 % (ref 12.0–46.0)
Lymphs Abs: 2.1 10*3/uL (ref 0.7–4.0)
MCHC: 34.5 g/dL (ref 30.0–36.0)
MCV: 84.2 fl (ref 78.0–100.0)
MONO ABS: 0.8 10*3/uL (ref 0.1–1.0)
Monocytes Relative: 9.8 % (ref 3.0–12.0)
NEUTROS PCT: 60 % (ref 43.0–77.0)
Neutro Abs: 4.7 10*3/uL (ref 1.4–7.7)
PLATELETS: 292 10*3/uL (ref 150.0–400.0)
RBC: 4.95 Mil/uL (ref 4.22–5.81)
RDW: 13.2 % (ref 11.5–15.5)
WBC: 7.8 10*3/uL (ref 4.0–10.5)

## 2014-08-04 LAB — COMPREHENSIVE METABOLIC PANEL
ALT: 20 U/L (ref 0–53)
AST: 15 U/L (ref 0–37)
Albumin: 4.2 g/dL (ref 3.5–5.2)
Alkaline Phosphatase: 67 U/L (ref 39–117)
BILIRUBIN TOTAL: 0.3 mg/dL (ref 0.2–1.2)
BUN: 16 mg/dL (ref 6–23)
CALCIUM: 9.6 mg/dL (ref 8.4–10.5)
CO2: 29 mEq/L (ref 19–32)
CREATININE: 1.16 mg/dL (ref 0.40–1.50)
Chloride: 100 mEq/L (ref 96–112)
GFR: 69.18 mL/min (ref >60.00)
Glucose, Bld: 227 mg/dL — ABNORMAL HIGH (ref 70–99)
Potassium: 3.8 mEq/L (ref 3.5–5.1)
Sodium: 137 mEq/L (ref 135–145)
Total Protein: 7.1 g/dL (ref 6.0–8.3)

## 2014-08-04 LAB — MICROALBUMIN / CREATININE URINE RATIO
Creatinine,U: 125.7 mg/dL
Microalb Creat Ratio: 0.6 mg/g (ref 0.0–30.0)
Microalb, Ur: <0.7 mg/dL (ref 0.0–1.9)

## 2014-08-04 LAB — URINALYSIS, ROUTINE W REFLEX MICROSCOPIC
Bilirubin Urine: NEGATIVE
Hgb urine dipstick: NEGATIVE
Ketones, ur: NEGATIVE
Leukocytes, UA: NEGATIVE
NITRITE: NEGATIVE
PH: 5.5 (ref 5.0–8.0)
TOTAL PROTEIN, URINE-UPE24: NEGATIVE
Urine Glucose: 500 — AB
Urobilinogen, UA: 0.2 (ref 0.0–1.0)

## 2014-08-04 LAB — TSH: TSH: 2.34 u[IU]/mL (ref 0.35–4.50)

## 2014-08-04 LAB — HEMOGLOBIN A1C: Hgb A1c MFr Bld: 8.8 % — ABNORMAL HIGH (ref 4.6–6.5)

## 2014-08-04 MED ORDER — CARVEDILOL 12.5 MG PO TABS: 12.5000 mg | ORAL_TABLET | Freq: Two times a day (BID) | ORAL | Status: DC

## 2014-08-04 MED ORDER — QUINAPRIL-HYDROCHLOROTHIAZIDE 20-12.5 MG PO TABS: 1.0000 | ORAL_TABLET | Freq: Two times a day (BID) | ORAL | Status: DC

## 2014-08-04 NOTE — Progress Notes (Signed)
Pre visit review using our clinic review tool, if applicable. No additional management support is needed unless otherwise documented below in the visit note. 

## 2014-08-04 NOTE — Patient Instructions (Signed)

## 2014-08-04 NOTE — Progress Notes (Signed)
Subjective:    Patient ID: Justin Galvan, male    DOB: 04/15/1961, 54 y.o.   MRN: [id number]  Diabetes He presents for his follow-up diabetic visit. He has type 2 diabetes mellitus. His disease course has been worsening. There are no hypoglycemic associated symptoms. Pertinent negatives for hypoglycemia include no dizziness. Associated symptoms include polyphagia and polyuria. Pertinent negatives for diabetes include no blurred vision, no chest pain, no fatigue, no foot paresthesias, no foot ulcerations, no polydipsia, no visual change, no weakness and no weight loss. There are no hypoglycemic complications. Symptoms are worsening. Diabetic complications include heart disease. Current diabetic treatment includes insulin injections and oral agent (dual therapy). He is compliant with treatment all of the time. His weight is stable. He is following a generally unhealthy diet. When asked about meal planning, he reported none. He participates in exercise intermittently. His home blood glucose trend is increasing steadily. An ACE inhibitor/angiotensin II receptor blocker is being taken. He does not see a podiatrist.Eye exam is current.      Review of Systems  Constitutional: Negative.  Negative for fever, chills, weight loss, diaphoresis, appetite change and fatigue.  HENT: Negative.   Eyes: Negative.  Negative for blurred vision.  Respiratory: Negative.  Negative for cough, choking, chest tightness, shortness of breath and stridor.   Cardiovascular: Negative.  Negative for chest pain, palpitations and leg swelling.  Gastrointestinal: Negative.  Negative for nausea, vomiting, abdominal pain, diarrhea, constipation and blood in stool.  Endocrine: Positive for polyphagia and polyuria. Negative for polydipsia.  Genitourinary: Negative.  Negative for dysuria, frequency and difficulty urinating.  Musculoskeletal: Negative.  Negative for myalgias, back pain, joint swelling and arthralgias.  Skin:  Negative.   Allergic/Immunologic: Negative.   Neurological: Negative.  Negative for dizziness and weakness.  Hematological: Negative.  Negative for adenopathy. Does not bruise/bleed easily.  Psychiatric/Behavioral: Negative.        Objective:   Physical Exam  Constitutional: He is oriented to person, place, and time. He appears well-developed and well-nourished. No distress.  HENT:  Head: Normocephalic and atraumatic.  Mouth/Throat: Oropharynx is clear and moist. No oropharyngeal exudate.  Eyes: Conjunctivae are normal. Right eye exhibits no discharge. Left eye exhibits no discharge. No scleral icterus.  Neck: Normal range of motion. Neck supple. No JVD present. No tracheal deviation present. No thyromegaly present.  Cardiovascular: Normal rate, regular rhythm, normal heart sounds and intact distal pulses.  Exam reveals no gallop and no friction rub.   No murmur heard. Pulmonary/Chest: Effort normal and breath sounds normal. No stridor. No respiratory distress. He has no wheezes. He has no rales. He exhibits no tenderness.  Abdominal: Soft. Bowel sounds are normal. He exhibits no distension and no mass. There is no tenderness. There is no rebound and no guarding.  Musculoskeletal: Normal range of motion. He exhibits no edema or tenderness.  Lymphadenopathy:    He has no cervical adenopathy.  Neurological: He is oriented to person, place, and time.  Skin: Skin is warm and dry. No rash noted. He is not diaphoretic. No erythema. No pallor.  Psychiatric: He has a normal mood and affect. His behavior is normal. Judgment and thought content normal.  Vitals reviewed.    Lab Results  Component Value Date   WBC 6.5 05/21/2013   HGB 13.8 05/21/2013   HCT 41.5 05/21/2013   PLT 226.0 05/21/2013   GLUCOSE 252* 01/06/2014   CHOL 182 01/06/2014   TRIG * 01/06/2014    465.0 Triglyceride is over  400; calculations on Lipids are invalid.   HDL 33.10* 01/06/2014   LDLDIRECT 95.0 01/06/2014    ALT 25 01/06/2014   AST 24 01/06/2014   NA 137 01/06/2014   K 4.2 01/06/2014   CL 101 01/06/2014   CREATININE 0.8 01/06/2014   BUN 16 01/06/2014   CO2 26 01/06/2014   TSH 1.36 05/21/2013   PSA 1.33 01/06/2014   INR 0.89 05/01/2013   HGBA1C 7.5* 01/06/2014   MICROALBUR 0.3 05/21/2013       Assessment & Plan:

## 2014-08-05 ENCOUNTER — Other Ambulatory Visit: Payer: Self-pay | Admitting: Internal Medicine

## 2014-08-05 ENCOUNTER — Encounter: Payer: Self-pay | Admitting: Internal Medicine

## 2014-08-05 DIAGNOSIS — I70209 Unspecified atherosclerosis of native arteries of extremities, unspecified extremity: Principal | ICD-10-CM

## 2014-08-05 DIAGNOSIS — E1151 Type 2 diabetes mellitus with diabetic peripheral angiopathy without gangrene: Secondary | ICD-10-CM

## 2014-08-05 MED ORDER — INSULIN DETEMIR 100 UNIT/ML FLEXPEN: 60.0000 [IU] | PEN_INJECTOR | Freq: Every day | SUBCUTANEOUS | Status: DC

## 2014-08-05 MED ORDER — ICOSAPENT ETHYL 1 G PO CAPS: 2.0000 | ORAL_CAPSULE | Freq: Two times a day (BID) | ORAL | Status: DC

## 2014-08-05 MED ORDER — EMPAGLIFLOZIN 10 MG PO TABS: 10.0000 mg | ORAL_TABLET | Freq: Every day | ORAL | Status: DC

## 2014-08-05 NOTE — Assessment & Plan Note (Signed)
His BP is adequately well controlled Lytes and renal function are stable 

## 2014-08-05 NOTE — Assessment & Plan Note (Signed)
Will start vascepa for this 

## 2014-08-05 NOTE — Assessment & Plan Note (Signed)
His A1C is up to 8.8% I have asked him to double his levemir dose and to add jardiance to his current regimen Will send him for diabetic education as well

## 2014-08-09 ENCOUNTER — Encounter: Payer: Self-pay | Admitting: Internal Medicine

## 2014-08-11 ENCOUNTER — Telehealth: Payer: Self-pay

## 2014-08-11 NOTE — Telephone Encounter (Signed)
Case approval #[id number]

## 2014-08-11 NOTE — Telephone Encounter (Signed)
Received call from [facility] stating Vascepa requires a prior authorization. They would like to know if ok to change to alternative Gemfibrozil 600mg , if so please advise on sig etc.. Thanks

## 2014-08-11 NOTE — Telephone Encounter (Signed)
No, need to stay with a fish oil

## 2014-08-11 NOTE — Telephone Encounter (Signed)
Per MD medication change is not appropriate, will begin PA to see if I may get approved.

## 2014-08-11 NOTE — Telephone Encounter (Signed)
Spoke w/ Eulah CitizenPauline ([facility]) who states that PA for medication has been approved 07-12-14 through 08/11/47. Patient is limited to only #120 (30day supply) from local pharmacy and #360 (3 month) is available through mail order only. Per insurance pt will be notified of approval as well as a fax to MD office.

## 2014-08-12 ENCOUNTER — Telehealth: Payer: Self-pay | Admitting: Internal Medicine

## 2014-08-12 DIAGNOSIS — E1151 Type 2 diabetes mellitus with diabetic peripheral angiopathy without gangrene: Secondary | ICD-10-CM

## 2014-08-12 DIAGNOSIS — I10 Essential (primary) hypertension: Secondary | ICD-10-CM

## 2014-08-12 DIAGNOSIS — E1165 Type 2 diabetes mellitus with hyperglycemia: Secondary | ICD-10-CM

## 2014-08-12 DIAGNOSIS — IMO0002 Reserved for concepts with insufficient information to code with codable children: Secondary | ICD-10-CM

## 2014-08-12 DIAGNOSIS — I70209 Unspecified atherosclerosis of native arteries of extremities, unspecified extremity: Secondary | ICD-10-CM

## 2014-08-12 NOTE — Telephone Encounter (Signed)
Pt called in and is requesting a call back .  He has several questions about his meds   Best number [phone]

## 2014-08-12 NOTE — Telephone Encounter (Signed)
Yes, do all of that to lower the blood sugars

## 2014-08-12 NOTE — Telephone Encounter (Signed)
Returned call to patient who wanted to clarify if doubling the levemir and adding Jardiance in addition to the current Janument? I read your office note but wanted to make sure before I send a refill for the Janument.

## 2014-08-12 NOTE — Telephone Encounter (Signed)
Patient notifed and advised that I will send refills for medications to [facility]

## 2014-08-13 MED ORDER — SITAGLIP PHOS-METFORMIN HCL ER 100-1000 MG PO TB24: 1.0000 | ORAL_TABLET | Freq: Every day | ORAL | Status: DC

## 2014-08-13 MED ORDER — QUINAPRIL-HYDROCHLOROTHIAZIDE 20-12.5 MG PO TABS: 1.0000 | ORAL_TABLET | Freq: Two times a day (BID) | ORAL | Status: DC

## 2014-08-13 MED ORDER — INSULIN DETEMIR 100 UNIT/ML FLEXPEN: 60.0000 [IU] | PEN_INJECTOR | Freq: Every day | SUBCUTANEOUS | Status: DC

## 2014-08-13 MED ORDER — CLOPIDOGREL BISULFATE 75 MG PO TABS: ORAL_TABLET | ORAL | Status: DC

## 2014-08-13 MED ORDER — EZETIMIBE-SIMVASTATIN 10-40 MG PO TABS: ORAL_TABLET | ORAL | Status: DC

## 2014-08-13 MED ORDER — CARVEDILOL 12.5 MG PO TABS: 12.5000 mg | ORAL_TABLET | Freq: Two times a day (BID) | ORAL | Status: DC

## 2014-08-13 MED ORDER — PAROXETINE HCL 40 MG PO TABS: ORAL_TABLET | ORAL | Status: DC

## 2014-08-13 MED ORDER — EMPAGLIFLOZIN 10 MG PO TABS: 10.0000 mg | ORAL_TABLET | Freq: Every day | ORAL | Status: DC

## 2014-08-13 NOTE — Telephone Encounter (Signed)
All medications have been refilled to mail order per pt request.

## 2014-08-26 LAB — HM DIABETES EYE EXAM

## 2014-09-07 NOTE — Addendum Note (Signed)
Addended by: Etta GrandchildJONES, Mariabelen Pressly L on: 09/07/2014 07:11 AM   Modules accepted: Kipp BroodSmartSet

## 2014-09-10 ENCOUNTER — Other Ambulatory Visit: Payer: Self-pay

## 2014-09-10 MED ORDER — GLUCOSE BLOOD VI STRP: ORAL_STRIP | Status: DC

## 2014-10-12 ENCOUNTER — Telehealth: Payer: Self-pay | Admitting: *Deleted

## 2014-10-12 NOTE — Telephone Encounter (Signed)
Left message for patient to call back about a1c. 

## 2014-10-15 ENCOUNTER — Telehealth: Payer: Self-pay | Admitting: *Deleted

## 2014-10-15 NOTE — Telephone Encounter (Signed)
Phone # busy unable to reach at this time.

## 2015-01-30 ENCOUNTER — Other Ambulatory Visit: Payer: Self-pay | Admitting: Internal Medicine

## 2015-04-22 ENCOUNTER — Encounter (HOSPITAL_COMMUNITY): Payer: Self-pay

## 2015-04-22 ENCOUNTER — Emergency Department (HOSPITAL_COMMUNITY): Payer: Commercial Managed Care - HMO

## 2015-04-22 ENCOUNTER — Inpatient Hospital Stay (HOSPITAL_COMMUNITY)
Admission: EM | Admit: 2015-04-22 | Discharge: 2015-04-26 | DRG: 287 | Disposition: A | Discharge status: Home / Self Care | Payer: Commercial Managed Care - HMO | Attending: Cardiology | Admitting: Cardiology

## 2015-04-22 DIAGNOSIS — E119 Type 2 diabetes mellitus without complications: Secondary | ICD-10-CM | POA: Diagnosis present

## 2015-04-22 DIAGNOSIS — I2582 Chronic total occlusion of coronary artery: Secondary | ICD-10-CM | POA: Diagnosis present

## 2015-04-22 DIAGNOSIS — Z87891 Personal history of nicotine dependence: Secondary | ICD-10-CM | POA: Diagnosis not present

## 2015-04-22 DIAGNOSIS — E1151 Type 2 diabetes mellitus with diabetic peripheral angiopathy without gangrene: Secondary | ICD-10-CM | POA: Diagnosis present

## 2015-04-22 DIAGNOSIS — Z8249 Family history of ischemic heart disease and other diseases of the circulatory system: Secondary | ICD-10-CM | POA: Diagnosis not present

## 2015-04-22 DIAGNOSIS — I25110 Atherosclerotic heart disease of native coronary artery with unstable angina pectoris: Principal | ICD-10-CM | POA: Insufficient documentation

## 2015-04-22 DIAGNOSIS — I252 Old myocardial infarction: Secondary | ICD-10-CM | POA: Diagnosis not present

## 2015-04-22 DIAGNOSIS — I249 Acute ischemic heart disease, unspecified: Secondary | ICD-10-CM | POA: Diagnosis present

## 2015-04-22 DIAGNOSIS — I70209 Unspecified atherosclerosis of native arteries of extremities, unspecified extremity: Secondary | ICD-10-CM | POA: Diagnosis present

## 2015-04-22 DIAGNOSIS — Z7902 Long term (current) use of antithrombotics/antiplatelets: Secondary | ICD-10-CM | POA: Diagnosis not present

## 2015-04-22 DIAGNOSIS — I10 Essential (primary) hypertension: Secondary | ICD-10-CM | POA: Diagnosis present

## 2015-04-22 DIAGNOSIS — E781 Pure hyperglyceridemia: Secondary | ICD-10-CM | POA: Diagnosis present

## 2015-04-22 DIAGNOSIS — Z7982 Long term (current) use of aspirin: Secondary | ICD-10-CM

## 2015-04-22 DIAGNOSIS — E785 Hyperlipidemia, unspecified: Secondary | ICD-10-CM | POA: Diagnosis not present

## 2015-04-22 DIAGNOSIS — R079 Chest pain, unspecified: Secondary | ICD-10-CM

## 2015-04-22 DIAGNOSIS — M19049 Primary osteoarthritis, unspecified hand: Secondary | ICD-10-CM | POA: Diagnosis present

## 2015-04-22 DIAGNOSIS — Z955 Presence of coronary angioplasty implant and graft: Secondary | ICD-10-CM

## 2015-04-22 DIAGNOSIS — F329 Major depressive disorder, single episode, unspecified: Secondary | ICD-10-CM | POA: Diagnosis present

## 2015-04-22 DIAGNOSIS — M542 Cervicalgia: Secondary | ICD-10-CM | POA: Diagnosis present

## 2015-04-22 DIAGNOSIS — Z794 Long term (current) use of insulin: Secondary | ICD-10-CM

## 2015-04-22 DIAGNOSIS — Z79899 Other long term (current) drug therapy: Secondary | ICD-10-CM

## 2015-04-22 DIAGNOSIS — I200 Unstable angina: Secondary | ICD-10-CM | POA: Diagnosis not present

## 2015-04-22 HISTORY — DX: Calculus of kidney: N20.0

## 2015-04-22 LAB — CBC
HEMATOCRIT: 41.5 % (ref 39.0–52.0)
Hemoglobin: 14.6 g/dL (ref 13.0–17.0)
MCH: 29.7 pg (ref 26.0–34.0)
MCHC: 35.2 g/dL (ref 30.0–36.0)
MCV: 84.3 fL (ref 78.0–100.0)
Platelets: 226 10*3/uL (ref 150–400)
RBC: 4.92 MIL/uL (ref 4.22–5.81)
RDW: 13 % (ref 11.5–15.5)
WBC: 6.8 10*3/uL (ref 4.0–10.5)

## 2015-04-22 LAB — BASIC METABOLIC PANEL
Anion gap: 10 (ref 5–15)
BUN: 15 mg/dL (ref 6–20)
CHLORIDE: 103 mmol/L (ref 101–111)
CO2: 25 mmol/L (ref 22–32)
Calcium: 9.1 mg/dL (ref 8.9–10.3)
Creatinine, Ser: 0.82 mg/dL (ref 0.61–1.24)
GFR calc Af Amer: >60 mL/min (ref >60)
GFR calc non Af Amer: >60 mL/min (ref >60)
Glucose, Bld: 153 mg/dL — ABNORMAL HIGH (ref 65–99)
POTASSIUM: 3.7 mmol/L (ref 3.5–5.1)
SODIUM: 138 mmol/L (ref 135–145)

## 2015-04-22 LAB — I-STAT TROPONIN, ED: Troponin i, poc: 0 ng/mL (ref 0.00–0.08)

## 2015-04-22 MED ORDER — ATORVASTATIN CALCIUM 80 MG PO TABS
80.0000 mg | ORAL_TABLET | Freq: Every day | ORAL | Status: DC
  Administered 2015-04-23 – 2015-04-26 (×4): 80 mg via ORAL
  Filled 2015-04-22 (×4): qty 1

## 2015-04-22 MED ORDER — INSULIN ASPART 100 UNIT/ML ~~LOC~~ SOLN
0.0000 [IU] | Freq: Three times a day (TID) | SUBCUTANEOUS | Status: DC
  Administered 2015-04-23: 2 [IU] via SUBCUTANEOUS
  Administered 2015-04-23 – 2015-04-24 (×3): 1 [IU] via SUBCUTANEOUS
  Administered 2015-04-24: 3 [IU] via SUBCUTANEOUS
  Administered 2015-04-24 – 2015-04-26 (×3): 2 [IU] via SUBCUTANEOUS

## 2015-04-22 MED ORDER — LISINOPRIL 10 MG PO TABS
10.0000 mg | ORAL_TABLET | Freq: Once | ORAL | Status: AC
  Administered 2015-04-23: 10 mg via ORAL
  Filled 2015-04-22: qty 1

## 2015-04-22 MED ORDER — MORPHINE SULFATE (PF) 4 MG/ML IV SOLN
4.0000 mg | Freq: Once | INTRAVENOUS | Status: AC
  Administered 2015-04-22: 4 mg via INTRAVENOUS
  Filled 2015-04-22: qty 1

## 2015-04-22 MED ORDER — NITROGLYCERIN 0.4 MG SL SUBL: 0.4000 mg | SUBLINGUAL_TABLET | SUBLINGUAL | Status: DC | PRN

## 2015-04-22 MED ORDER — HEPARIN (PORCINE) IN NACL 100-0.45 UNIT/ML-% IJ SOLN
1400.0000 [IU]/h | INTRAMUSCULAR | Status: DC
  Administered 2015-04-22: 1150 [IU]/h via INTRAVENOUS
  Administered 2015-04-23: 1250 [IU]/h via INTRAVENOUS
  Administered 2015-04-25 – 2015-04-26 (×2): 1550 [IU]/h via INTRAVENOUS
  Filled 2015-04-22 (×5): qty 250

## 2015-04-22 MED ORDER — HEPARIN BOLUS VIA INFUSION
4000.0000 [IU] | Freq: Once | INTRAVENOUS | Status: AC
  Administered 2015-04-22: 4000 [IU] via INTRAVENOUS
  Filled 2015-04-22: qty 4000

## 2015-04-22 MED ORDER — ONDANSETRON HCL 4 MG/2ML IJ SOLN: 4.0000 mg | Freq: Four times a day (QID) | INTRAMUSCULAR | Status: DC | PRN

## 2015-04-22 MED ORDER — ASPIRIN EC 81 MG PO TBEC
81.0000 mg | DELAYED_RELEASE_TABLET | Freq: Every day | ORAL | Status: DC
  Administered 2015-04-23 – 2015-04-25 (×3): 81 mg via ORAL
  Filled 2015-04-22 (×3): qty 1

## 2015-04-22 MED ORDER — ACETAMINOPHEN 325 MG PO TABS
650.0000 mg | ORAL_TABLET | ORAL | Status: DC | PRN
  Administered 2015-04-22 – 2015-04-23 (×4): 650 mg via ORAL
  Filled 2015-04-22 (×4): qty 2

## 2015-04-22 MED ORDER — HYDRALAZINE HCL 20 MG/ML IJ SOLN: 10.0000 mg | Freq: Four times a day (QID) | INTRAMUSCULAR | Status: DC | PRN

## 2015-04-22 MED ORDER — SODIUM CHLORIDE 0.9 % IV SOLN
INTRAVENOUS | Status: DC
Start: 2015-04-22 — End: 2015-04-23

## 2015-04-22 MED ORDER — CARVEDILOL 12.5 MG PO TABS
12.5000 mg | ORAL_TABLET | Freq: Two times a day (BID) | ORAL | Status: DC
  Administered 2015-04-22 – 2015-04-24 (×4): 12.5 mg via ORAL
  Filled 2015-04-22 (×4): qty 1

## 2015-04-22 MED ORDER — PAROXETINE HCL 20 MG PO TABS
40.0000 mg | ORAL_TABLET | Freq: Every day | ORAL | Status: DC
  Administered 2015-04-23 – 2015-04-26 (×4): 40 mg via ORAL
  Filled 2015-04-22 (×4): qty 2

## 2015-04-22 NOTE — ED Provider Notes (Signed)
CSN: [id number]     Arrival date & time 04/22/15  1735 History   First MD Initiated Contact with Patient 04/22/15 1752     Chief Complaint  Patient presents with  . Headache  . Chest Pain     (Consider location/radiation/quality/duration/timing/severity/associated sxs/prior Treatment) HPI Justin Galvan is a 54 y.o. male with a history of CAD with multiple stent placement, on anticoagulation, unstable angina, insulin dependent diabetes, comes in for evaluation of chest pain and headache. Patient reports over the past 2 weeks he has had worsening headache and chest pain similar to when he has had his previous heart attacks. He reports symptoms of "burning in my neck and head and down both of my arms, just like all of my other heart attacks". He reports working at a [employer] site today and symptoms worsen with exertion and resolved after rest, but today, since 2:00 PM symptoms have been constant and unrelenting. Did not take any sublingual nitroglycerin. Currently rates discomfort as 4/10.  He reports taking "6 baby aspirin and 2 baby aspirin earlier this morning". He also reports worsening shortness of breath at rest over the past 2 weeks.  No nausea, vomiting, dizziness, numbness or weakness, diaphoresis, overt chest pain. No other modifying factors.  Past Medical History  Diagnosis Date  . HLD (hyperlipidemia)   . HTN (hypertension)   . BPH (benign prostatic hypertrophy)   . Depression   . Anginal pain (HCC)   . S/P coronary artery stent placement -mid LAD, 05/06/13 Promus Premier DES 05/07/2013  . CAD, NATIVE VESSEL, Hx. stent-RCA 1997; 2 Taxusprox & mid RCA 06/2003, cath 05/06/13 100% RCA, mid LAD disease 05/03/2009    Qualifier: Diagnosis of  By: Denyse Amass CMA, Carol    . MI (myocardial infarction) (HCC) 1997; ~ 2002; 04/2013  . Kidney stones   . DM2 (diabetes mellitus, type 2) (HCC)   . Arthritis     "fingers" (04/22/2015)   Past Surgical History  Procedure Laterality Date  . Left  heart catheterization with coronary angiogram N/A 05/06/2013    Procedure: LEFT HEART CATHETERIZATION WITH CORONARY ANGIOGRAM;  Surgeon: Kathleene Hazel, MD;  Location: [facility] CATH LAB;  Service: Cardiovascular;  Laterality: N/A;  . Coronary angioplasty with stent placement  1997 - 04/2013    "got several stents; may 8 or 9"  . Coronary angioplasty with stent placement  05/06/2013    DES/LAD               DR Clifton James   Family History  Problem Relation Age of Onset  . Hypertension    . Cancer Neg Hx   . Diabetes Neg Hx   . Early death Neg Hx   . Hyperlipidemia Neg Hx   . Kidney disease Neg Hx   . Stroke Neg Hx   . Alcohol abuse Neg Hx   . Heart disease Mother    Social History  Substance Use Topics  . Smoking status: Former Smoker -- 2.75 packs/day for 22 years    Types: Cigarettes    Quit date: 09/07/1996  . Smokeless tobacco: Never Used  . Alcohol Use: Yes     Comment: 04/22/2015 "might drink a 6 pack of beer/year"    Review of Systems A 10 point review of systems was completed and was negative except for pertinent positives and negatives as mentioned in the history of present illness     Allergies  Review of patient's allergies indicates no known allergies.  Home Medications   Prior  to Admission medications   Medication Sig Start Date End Date Taking? Authorizing Provider  aspirin EC 81 MG tablet Take 81 mg by mouth daily.   Yes Historical Provider, MD  carvedilol (COREG) 12.5 MG tablet Take 1 tablet (12.5 mg total) by mouth 2 (two) times daily. 08/13/14  Yes Etta Grandchild, MD  empagliflozin (JARDIANCE) 10 MG TABS tablet Take 10 mg by mouth daily. 08/13/14  Yes Etta Grandchild, MD  glucose blood test strip Test up to two times daily 09/10/14  Yes Etta Grandchild, MD  Icosapent Ethyl (VASCEPA) 1 G CAPS Take 2 capsules by mouth 2 (two) times daily. 08/05/14  Yes Etta Grandchild, MD  Insulin Detemir (LEVEMIR FLEXPEN) 100 UNIT/ML Pen Inject 60 Units into the skin daily at 10  pm. 08/13/14  Yes Etta Grandchild, MD  [facility] DELICA LANCETS 33G MISC Test up to two times daily 05/22/13  Yes Etta Grandchild, MD  PARoxetine (PAXIL) 40 MG tablet TAKE 1 TABLET (40 MG TOTAL) BY MOUTH DAILY. 08/13/14  Yes Etta Grandchild, MD  quinapril-hydrochlorothiazide (ACCURETIC) 20-12.5 MG per tablet Take 1 tablet by mouth 2 (two) times daily. 08/13/14  Yes Etta Grandchild, MD  SitaGLIPtin-MetFORMIN HCl (JANUMET XR) [phone] MG TB24 Take 1 tablet by mouth daily. 08/13/14  Yes Etta Grandchild, MD  clopidogrel (PLAVIX) 75 MG tablet TAKE 1 TABLET (75 MG TOTAL) BY MOUTH DAILY WITH BREAKFAST. 08/13/14   Etta Grandchild, MD  ezetimibe-simvastatin (VYTORIN) 10-40 MG per tablet TAKE 1 TABLET AT BEDTIME (NEED AN APPOINTMENT FOR FURTHER REFILLS) Patient not taking: Reported on 04/22/2015 08/13/14   Etta Grandchild, MD  nitroGLYCERIN (NITROSTAT) 0.4 MG SL tablet Place 1 tablet (0.4 mg total) under the tongue every 5 (five) minutes as needed for chest pain. 05/07/13   Leone Brand, NP   BP 187/106 mmHg  Pulse 75  Temp(Src) 98.3 F (36.8 C) (Oral)  Resp 16  Ht  (1.702 m)  Wt 82.963 kg  BMI 28.64 kg/m2  SpO2 94% Physical Exam  Constitutional: He is oriented to person, place, and time. He appears well-developed and well-nourished.  HENT:  Head: Normocephalic and atraumatic.  Mouth/Throat: Oropharynx is clear and moist.  Eyes: Conjunctivae are normal. Pupils are equal, round, and reactive to light. Right eye exhibits no discharge. Left eye exhibits no discharge. No scleral icterus.  Neck: Neck supple.  Cardiovascular: Normal rate, regular rhythm and normal heart sounds.   Pulmonary/Chest: Effort normal and breath sounds normal. No respiratory distress. He has no wheezes. He has no rales.  Abdominal: Soft. There is no tenderness.  Musculoskeletal: He exhibits no tenderness.  Neurological: He is alert and oriented to person, place, and time.  Cranial Nerves II-XII grossly intact  Skin: Skin is warm and  dry. No rash noted.  Psychiatric: He has a normal mood and affect.  Nursing note and vitals reviewed.   ED Course  Procedures (including critical care time) Labs Review Labs Reviewed  BASIC METABOLIC PANEL - Abnormal; Notable for the following:    Glucose, Bld 153 (*)    All other components within normal limits  MRSA PCR SCREENING  CBC  TROPONIN I  CBC  HEPARIN LEVEL (UNFRACTIONATED)  TROPONIN I  TROPONIN I  HEMOGLOBIN A1C  BASIC METABOLIC PANEL  PROTIME-INR  LIPID PANEL  I-STAT TROPOININ, ED    Imaging Review Dg Chest 2 View  04/22/2015  CLINICAL DATA:  Pt brought in EMS for a frontal HA that  radiates down bilateral arms with left sided chest pain. Symptoms have been present intermittently x2 weeks. Hx MI, HTN controlled with medication, diabetes and ex-smoker as of 20 years ago. EXAM: CHEST  2 VIEW COMPARISON:  None. FINDINGS: The heart size and mediastinal contours are within normal limits. Both lungs are clear. The visualized skeletal structures are unremarkable. IMPRESSION: No active cardiopulmonary disease. Electronically Signed   By: Norva PavlovElizabeth  Brown M.D.   On: 04/22/2015 18:32   I have personally reviewed and evaluated these images and lab results as part of my medical decision-making.   EKG Interpretation   Date/Time:  Friday April 22 2015 17:46:11 EST Ventricular Rate:  96 PR Interval:  152 QRS Duration: 117 QT Interval:  350 QTC Calculation: 442 R Axis:   -4 Text Interpretation:  Sinus rhythm Nonspecific intraventricular conduction  delay Inferolateral infarct, old Baseline wander in lead(s) V3 since last  tracing no significant change Confirmed by [facility] Memorial HospitalWENTZ  MD, ELLIOTT [phone]([id number]) on  04/22/2015 5:53:50 PM     Meds given in ED:  Medications  heparin ADULT infusion 100 units/mL (25000 units/250 mL) (1,150 Units/hr Intravenous New Bag/Given 04/22/15 2127)  carvedilol (COREG) tablet 12.5 mg (12.5 mg Oral Given 04/22/15 2333)  PARoxetine (PAXIL) tablet 40 mg  (not administered)  aspirin EC tablet 81 mg (not administered)  nitroGLYCERIN (NITROSTAT) SL tablet 0.4 mg (not administered)  acetaminophen (TYLENOL) tablet 650 mg (650 mg Oral Given 04/22/15 2333)  ondansetron (ZOFRAN) injection 4 mg (not administered)  0.9 %  sodium chloride infusion ( Intravenous Transfusing/Transfer 04/22/15 2334)  atorvastatin (LIPITOR) tablet 80 mg (not administered)  insulin aspart (novoLOG) injection 0-9 Units (not administered)  hydrALAZINE (APRESOLINE) injection 10 mg (not administered)  morphine 4 MG/ML injection 4 mg (4 mg Intravenous Given 04/22/15 1845)  heparin bolus via infusion 4,000 Units (4,000 Units Intravenous Given 04/22/15 2127)  lisinopril (PRINIVIL,ZESTRIL) tablet 10 mg (10 mg Oral Given 04/23/15 0009)    Current Discharge Medication List     Filed Vitals:   04/22/15 2115 04/22/15 2130 04/22/15 2200 04/22/15 2305  BP: 152/81 157/94 185/98 187/106  Pulse: 76 77 78 75  Temp:    98.3 F (36.8 C)  TempSrc:    Oral  Resp: 20 13 13 16   Height:    5\' 7"  (1.702 m)  Weight:    82.963 kg  SpO2: 95% 97% 96% 94%    MDM  Justin Galvan is a 54 y.o. male with known CAD and multiple stents, diabetes, comes in for evaluation of chest pain. Presentation concerning for unstable angina. Patient reports he has had #6 325 mg aspirin and #2 81 mg aspirin EKG is unchanged from prior. Initial troponin negative. Plan to consult cardiology. Discussed with my attending, Dr. Effie ShyWentz, who also saw and evaluated the patient and agrees with cardiology consultation. Discussed with cardiology, Dr. Terressa KoyanagiBalfour, agrees with plan for heparin and will see in the ED for subsequent admission. Final diagnoses:  Chest pain, unspecified chest pain type        Joycie PeekBenjamin Nashla Althoff, PA-C 04/23/15 0107  Mancel BaleElliott Wentz, MD 04/23/15 1600

## 2015-04-22 NOTE — Progress Notes (Signed)
Balfour, MD notified of SBP - asked for med to bring BP down. Will continue to monitor closely.

## 2015-04-22 NOTE — ED Notes (Signed)
Attempted report 

## 2015-04-22 NOTE — H&P (Signed)
[facility] History and Physical  PCP: Scarlette Calico, MD Cardiologist: Gennette Pac  History of Present Illness (and review of medical records): Justin Galvan is a 54 y.o. male who presents for evaluation of anginal equivalent of headache, neck and chest pain.  He was last seen in clinic 06/2013 by Dr. Angelena Form.  He has hx of CAD, DM, HTN, HL. Previously followed by Dr. Jenell Milliner. He suffered a MI in 1997 and had s/p stent to the RCA. Last cath was in 06/2003 and he underwent Taxus DES x 2 to prox and mid RCA. Echo (01/2006): EF 40-45%, inf-post HK, mild LAE. He was seen by Dr. Lauree Chandler with complaints of chest pain with exertion c/w CCS Class III angina. He was set up for cardiac cath. LHC (05/06/2013): Mid LAD 90 (hazy), ost Dx 80, prox RCA stent 50 ISR, mid RCA 100 (L-R collats), inf HK, EF 40-45%. LAD FFR 0.91 => 0.80. PCI: Promus (3 x 12 mm) DES to the mid LAD. Post PCI course was uncomplicated.   He now presents with two weeks of intermittent symptoms mainly with exertion.  He works in [employer] and has had left sided neck and chest pain, headaches, bilateral arm pain with exertion and relieved with rest.  He has also had shortness of breath and palpitations.  These symptoms have gotten progressively worse and are similar to prior episodes where he had MI and/or had obstructive disease.  Today at work, developed symptoms around 2pm.  Pain at its worse was 6/10.  Pain would decrease to 2/10 with rest but return with activity.  He took 6 ASA with no relief.  He is unclear as to how long he has been off Plavix, likely over 6 months. He did not have NTG at home.  He went home around 3 pm and shortly after presented to the ED with his wife for further evaluation.    He was given Morphine and NTG in ED with improvement, now pain around 1-2.  He had no acute changes on initial ekg and troponin was negative.  Previous diagnostic testing for coronary artery disease  includes: cardiac catheterization and echocardiogram. Previous history of cardiac disease includes Angina, Coronary Artery Disease, Coronary Artery Stent, Ischemic Heart Disease, MI. Coronary artery disease risk factors include: advanced age (older than 53 for men, 53 for women), diabetes mellitus, dyslipidemia, hypertension and male gender.  Patient denies history of arrhythmia, CABG, pericarditis and valvular disease.  Review of Systems A comprehensive review of systems was negative except for:  Respiratory: positive for dyspnea on exertion Cardiovascular: positive for fatigue Further review of systems was negative other than stated in HPI.  Past Medical History  Diagnosis Date  . HLD (hyperlipidemia)   . HTN (hypertension)   . BPH (benign prostatic hypertrophy)   . Depression   . Anginal pain ([place])   . S/P coronary artery stent placement -mid LAD, 05/06/13 Promus Premier DES 05/07/2013  . CAD, NATIVE VESSEL, Hx. stent-RCA 1997; 2 Taxusprox & mid RCA 06/2003, cath 05/06/13 100% RCA, mid LAD disease 05/03/2009    Qualifier: Diagnosis of  By: Orville Govern CMA, Carol    . MI (myocardial infarction) ([place]) 1997; ~ 2002; 04/2013  . Kidney stones   . DM2 (diabetes mellitus, type 2) ([place])   . Arthritis     "fingers" (04/22/2015)    Past Surgical History  Procedure Laterality Date  . Left heart catheterization with coronary angiogram N/A 05/06/2013    Procedure: LEFT HEART CATHETERIZATION WITH  CORONARY ANGIOGRAM;  Surgeon: Burnell Blanks, MD;  [facility] C[facility] LAB;  Service: Cardiovascular;  Laterality: N/A;  . Coronary angioplasty with stent placement  1997 - 04/2013    "got several stents; may 8 or 9"  . Coronary angioplasty with stent placement  05/06/2013    DES/LAD               DR Angelena Form    Prescriptions prior to admission  Medication Sig Dispense Refill Last Dose  . aspirin EC 81 MG tablet Take 81 mg by mouth daily.   04/21/2015 at Unknown time  . carvedilol (COREG) 12.5 MG tablet  Take 1 tablet (12.5 mg total) by mouth 2 (two) times daily. 60 tablet 11 04/21/2015 at 2230  . empagliflozin (JARDIANCE) 10 MG TABS tablet Take 10 mg by mouth daily. 90 tablet 3 04/21/2015 at Unknown time  . glucose blood test strip Test up to two times daily 100 each 12   . Icosapent Ethyl (VASCEPA) 1 G CAPS Take 2 capsules by mouth 2 (two) times daily. 360 capsule 3 04/21/2015 at Unknown time  . Insulin Detemir (LEVEMIR FLEXPEN) 100 UNIT/ML Pen Inject 60 Units into the skin daily at 10 pm. 45 mL 3 04/21/2015 at Unknown time  . ONETOUCH DELICA LANCETS 44L MISC Test up to two times daily 100 each 11 Taking  . PARoxetine (PAXIL) 40 MG tablet TAKE 1 TABLET (40 MG TOTAL) BY MOUTH DAILY. 90 tablet 3 04/21/2015 at Unknown time  . quinapril-hydrochlorothiazide (ACCURETIC) 20-12.5 MG per tablet Take 1 tablet by mouth 2 (two) times daily. 180 tablet 3 04/21/2015 at 2230  . SitaGLIPtin-MetFORMIN HCl (JANUMET XR) [phone] MG TB24 Take 1 tablet by mouth daily. 90 tablet 3 04/21/2015 at Unknown time  . clopidogrel (PLAVIX) 75 MG tablet TAKE 1 TABLET (75 MG TOTAL) BY MOUTH DAILY WITH BREAKFAST. 90 tablet 1   . ezetimibe-simvastatin (VYTORIN) 10-40 MG per tablet TAKE 1 TABLET AT BEDTIME (NEED AN APPOINTMENT FOR FURTHER REFILLS) (Patient not taking: Reported on 04/22/2015) 90 tablet 3 Not Taking at Unknown time  . nitroGLYCERIN (NITROSTAT) 0.4 MG SL tablet Place 1 tablet (0.4 mg total) under the tongue every 5 (five) minutes as needed for chest pain. 25 tablet 4 Taking   No Known Allergies  Social History  Substance Use Topics  . Smoking status: Former Smoker -- 2.75 packs/day for 22 years    Types: Cigarettes    Quit date: 09/07/1996  . Smokeless tobacco: Never Used  . Alcohol Use: Yes     Comment: 04/22/2015 "might drink a 6 pack of beer/year"    Family History  Problem Relation Age of Onset  . Hypertension    . Cancer Neg Hx   . Diabetes Neg Hx   . Early death Neg Hx   . Hyperlipidemia Neg Hx   .  Kidney disease Neg Hx   . Stroke Neg Hx   . Alcohol abuse Neg Hx   . Heart disease Mother      Objective:  Patient Vitals for the past 8 hrs:  BP Temp Temp src Pulse Resp SpO2 Height Weight  04/22/15 2305 (!) 187/106 mmHg 98.3 F (36.8 C) Oral 75 16 94 % '5\' 7"'  (1.702 m) 82.963 kg (182 lb 14.4 oz)  04/22/15 2200 185/98 mmHg - - 78 13 96 % - -  04/22/15 2130 157/94 mmHg - - 77 13 97 % - -  04/22/15 2115 152/81 mmHg - - 76 20 95 % - -  04/22/15 2100 183/96 mmHg - - 78 18 97 % - -  04/22/15 2030 164/93 mmHg - - 77 15 97 % - -  04/22/15 2015 169/85 mmHg - - 82 23 97 % - -  04/22/15 2000 164/78 mmHg - - 86 16 95 % - -  04/22/15 1959 - - - 89 19 96 % - -  04/22/15 1958 - - - 91 15 96 % - -  04/22/15 1957 - - - 92 16 98 % - -  04/22/15 1800 (!) 184/108 mmHg - - 95 13 96 % - -   General appearance: alert, cooperative, appears stated age and no distress Head: Normocephalic, without obvious abnormality, atraumatic Eyes: PERRL, EOM's intact. Neck: no JVD and supple, Lungs: clear to auscultation bilaterally Chest wall: no tenderness Heart: regular rate and rhythm, S1, S2 normal Abdomen: soft, non-tender; bowel sounds normal Extremities: extremities normal, atraumatic, no edema Pulses: 2+ and symmetric Neurologic: Grossly normal  Results for orders placed or performed during the hospital encounter of 04/22/15 (from the past 48 hour(s))  Basic metabolic panel     Status: Abnormal   Collection Time: 04/22/15  6:45 PM  Result Value Ref Range   Sodium 138 135 - 145 mmol/L   Potassium 3.7 3.5 - 5.1 mmol/L   Chloride 103 101 - 111 mmol/L   CO2 25 22 - 32 mmol/L   Glucose, Bld 153 (H) 65 - 99 mg/dL   BUN 15 6 - 20 mg/dL   Creatinine, Ser 0.82 0.61 - 1.24 mg/dL   Calcium 9.1 8.9 - 10.3 mg/dL   GFR calc non Af Amer >60 >60 mL/min   GFR calc Af Amer >60 >60 mL/min    Comment: (NOTE) The eGFR has been calculated using the CKD EPI equation. This calculation has not been validated in all  clinical situations. eGFR's persistently <60 mL/min signify possible Chronic Kidney Disease.    Anion gap 10 5 - 15  CBC     Status: None   Collection Time: 04/22/15  6:45 PM  Result Value Ref Range   WBC 6.8 4.0 - 10.5 K/uL   RBC 4.92 4.22 - 5.81 MIL/uL   Hemoglobin 14.6 13.0 - 17.0 g/dL   HCT 41.5 39.0 - 52.0 %   MCV 84.3 78.0 - 100.0 fL   MCH 29.7 26.0 - 34.0 pg   MCHC 35.2 30.0 - 36.0 g/dL   RDW 13.0 11.5 - 15.5 %   Platelets 226 150 - 400 K/uL  I-stat troponin, ED (not at [facility], [facility])     Status: None   Collection Time: 04/22/15  7:09 PM  Result Value Ref Range   Troponin i, poc 0.00 0.00 - 0.08 ng/mL   Comment 3            Comment: Due to the release kinetics of cTnI, a negative result within the first hours of the onset of symptoms does not rule out myocardial infarction with certainty. If myocardial infarction is still suspected, repeat the test at appropriate intervals.   Troponin I     Status: None   Collection Time: 04/23/15 12:13 AM  Result Value Ref Range   Troponin I <0.03 <0.031 ng/mL    Comment:        NO INDICATION OF MYOCARDIAL INJURY.    Dg Chest 2 View  04/22/2015  CLINICAL DATA:  Pt brought in EMS for a frontal HA that radiates down bilateral arms with left sided chest pain. Symptoms have been present  intermittently x2 weeks. Hx MI, HTN controlled with medication, diabetes and ex-smoker as of 20 years ago. EXAM: CHEST  2 VIEW COMPARISON:  None. FINDINGS: The heart size and mediastinal contours are within normal limits. Both lungs are clear. The visualized skeletal structures are unremarkable. IMPRESSION: No active cardiopulmonary disease. Electronically Signed   By: Nolon Nations M.D.   On: 04/22/2015 18:32    ECG:  Sinus rhythm HR 96, old inferior infarct, similar to prior. No acute changes.  Cardiac cath 05/06/13: Left main: No obstructive disease.  Left Anterior Descending Artery: Large caliber vessel that courses to the apex.  Circumflex  Artery: Large caliber vessel with small intermediate branch, moderate caliber obtuse marginal branch. No obstructive disease noted. The mid vessel has a focal 90%, hazy stenosis just at the takeoff of the small caliber diagonal branch. The diagonal branch is small in caliber and has a 80% ostial stenosis. The second diagonal branch is moderate in caliber with no obstructive disease.  Right Coronary Artery: Large dominant vessel with stents noted in the proximal, mid and distal segment. The proximal stent is patent with diffuse 50% restenosis. The mid vessel has 100% stenosis. The distal vessel is seen to fill briskly via left to righ collaterals.  Left Ventricular Angiogram: LVEF=40-45% with inferior hypokinesis.  Impression:  1. Severe double vessel CAD  2. Severe stenosis mid LAD, likely culprit for angina  3. Chronic occlusion of mid RCA with good collaterals.  4. Successful PTCA/DES x 1 mid LAD  5. Moderate segmental LV systolic dysfunction  Assessment:  38M hx of known CAD s/p prior PCI, with chronic occulsion of mid RCA with good collaterals, last PCI to mid LAD 04/2013, with moderate LV systolic dysfunction, HTN, HLD, DM who presents with ACS/Unstable angina.  ACS/Unstable Angina Ischemic CMP Hypertension Hyperlipidemia Diabetes Mellitus  Plan: 1. Cardiology Admission  2. Continuous monitoring on Telemetry. 3. Repeat ekg on admit, prn chest pain or arrythmia 4. Cycle cardiac biomarkers, check lipids, hgba1c 5. Medical management to include ASA, Heparin gtt, BB, Statin, NTG prn 6. Echo in am 7. Hold metformin, SSI 8. Will likely need further ischemic evaluation, probably cardiac catheterization.

## 2015-04-22 NOTE — ED Notes (Signed)
MD at bedside. 

## 2015-04-22 NOTE — Consult Note (Signed)
ANTICOAGULATION CONSULT NOTE - Initial Consult  Pharmacy Consult for Heparin Indication: chest pain/ACS  No Known Allergies  Patient Measurements: Height: 5\' 7"  (170.2 cm) Weight: 182 lb (82.555 kg) IBW/kg (Calculated) : 66.1  Vital Signs: Temp: 99.3 F (37.4 C) (12/30 1745) Temp Source: Oral (12/30 1745) BP: 184/108 mmHg (12/30 1800) Pulse Rate: 89 (12/30 1959)  Labs:  Recent Labs  04/22/15 1845  HGB 14.6  HCT 41.5  PLT 226  CREATININE 0.82    Estimated Creatinine Clearance: 103.4 mL/min (by C-G formula based on Cr of 0.82).   Medical History: Past Medical History  Diagnosis Date  . MI (myocardial infarction) ([facility])   . HLD (hyperlipidemia)   . HTN (hypertension)   . DM2 (diabetes mellitus, type 2) ([facility])   . BPH (benign prostatic hypertrophy)   . Depression   . Anginal pain ([facility])   . Arthritis   . S/P coronary artery stent placement -mid LAD, 05/06/13 Promus Premier DES 05/07/2013  . CAD, NATIVE VESSEL, Hx. stent-RCA 1997; 2 Taxusprox & mid RCA 06/2003, cath 05/06/13 100% RCA, mid LAD disease 05/03/2009    Qualifier: Diagnosis of  By: Denyse AmassFiato, CMA, Carol      Medications:  No anticoagulants pta  Assessment: 54yom with hx CAD s/p stents presents to the ED with chest pain that is similar to his previous MIs. He will begin IV heparin. Baseline labs wnl.  Goal of Therapy:  Heparin level 0.3-0.7 units/ml Monitor platelets by anticoagulation protocol: Yes   Plan:  1) Heparin bolus 4000 units x 1 2) Heparin drip at 1150 units/hr 3) Check 6 hour heparin level 4) Daily heparin level and CBC  Fredrik RiggerMarkle, Kristian Hazzard Sue 04/22/2015,8:37 PM

## 2015-04-22 NOTE — ED Notes (Signed)
Pt brought in EMS for a frontal HA that radiates down bilateral arms with left sided chest pain.  Symptoms have been present intermittently x2 weeks.  Pt reports today he has taken a total of 6 325mg  ASA and 2 81mg  ASA.

## 2015-04-23 ENCOUNTER — Inpatient Hospital Stay (HOSPITAL_COMMUNITY): Payer: Commercial Managed Care - HMO

## 2015-04-23 DIAGNOSIS — I249 Acute ischemic heart disease, unspecified: Secondary | ICD-10-CM

## 2015-04-23 DIAGNOSIS — R079 Chest pain, unspecified: Secondary | ICD-10-CM

## 2015-04-23 DIAGNOSIS — I10 Essential (primary) hypertension: Secondary | ICD-10-CM

## 2015-04-23 DIAGNOSIS — E785 Hyperlipidemia, unspecified: Secondary | ICD-10-CM

## 2015-04-23 LAB — MRSA PCR SCREENING: MRSA BY PCR: POSITIVE — AB

## 2015-04-23 LAB — GLUCOSE, CAPILLARY
Glucose-Capillary: 145 mg/dL — ABNORMAL HIGH (ref 65–99)
Glucose-Capillary: 136 mg/dL — ABNORMAL HIGH (ref 65–99)
Glucose-Capillary: 174 mg/dL — ABNORMAL HIGH (ref 65–99)

## 2015-04-23 LAB — BASIC METABOLIC PANEL
ANION GAP: 8 (ref 5–15)
BUN: 13 mg/dL (ref 6–20)
CALCIUM: 8.5 mg/dL — AB (ref 8.9–10.3)
CHLORIDE: 104 mmol/L (ref 101–111)
CO2: 26 mmol/L (ref 22–32)
Creatinine, Ser: 0.76 mg/dL (ref 0.61–1.24)
GFR calc non Af Amer: >60 mL/min (ref >60)
Glucose, Bld: 134 mg/dL — ABNORMAL HIGH (ref 65–99)
Potassium: 3.5 mmol/L (ref 3.5–5.1)
Sodium: 138 mmol/L (ref 135–145)

## 2015-04-23 LAB — CBC
HCT: 39.6 % (ref 39.0–52.0)
Hemoglobin: 13.7 g/dL (ref 13.0–17.0)
MCH: 29.5 pg (ref 26.0–34.0)
MCHC: 34.6 g/dL (ref 30.0–36.0)
MCV: 85.2 fL (ref 78.0–100.0)
Platelets: 190 K/uL (ref 150–400)
RBC: 4.65 MIL/uL (ref 4.22–5.81)
RDW: 13.1 % (ref 11.5–15.5)
WBC: 6.3 K/uL (ref 4.0–10.5)

## 2015-04-23 LAB — TROPONIN I
Troponin I: <0.03 ng/mL (ref <0.031)
Troponin I: <0.03 ng/mL (ref <0.031)
Troponin I: <0.03 ng/mL (ref <0.031)

## 2015-04-23 LAB — LIPID PANEL
CHOLESTEROL: 249 mg/dL — AB (ref 0–200)
HDL: 31 mg/dL — AB (ref >40)
LDL CALC: UNDETERMINED mg/dL (ref 0–99)
TRIGLYCERIDES: 487 mg/dL — AB (ref <150)
Total CHOL/HDL Ratio: 8 RATIO
VLDL: UNDETERMINED mg/dL (ref 0–40)

## 2015-04-23 LAB — PROTIME-INR
INR: 1.04 (ref 0.00–1.49)
Prothrombin Time: 13.8 seconds (ref 11.6–15.2)

## 2015-04-23 LAB — HEPARIN LEVEL (UNFRACTIONATED)
Heparin Unfractionated: 0.3 IU/mL (ref 0.30–0.70)
Heparin Unfractionated: 0.38 IU/mL (ref 0.30–0.70)

## 2015-04-23 MED ORDER — LISINOPRIL 10 MG PO TABS
10.0000 mg | ORAL_TABLET | Freq: Every day | ORAL | Status: DC
  Administered 2015-04-23 – 2015-04-24 (×2): 10 mg via ORAL
  Filled 2015-04-23 (×2): qty 1

## 2015-04-23 MED ORDER — MUPIROCIN 2 % EX OINT
1.0000 "application " | TOPICAL_OINTMENT | Freq: Two times a day (BID) | CUTANEOUS | Status: DC
  Administered 2015-04-23 – 2015-04-26 (×6): 1 via NASAL
  Filled 2015-04-23: qty 22

## 2015-04-23 MED ORDER — HYDROCODONE-ACETAMINOPHEN 5-325 MG PO TABS
1.0000 | ORAL_TABLET | ORAL | Status: DC | PRN
  Administered 2015-04-23 – 2015-04-24 (×2): 1 via ORAL
  Filled 2015-04-23 (×2): qty 1

## 2015-04-23 MED ORDER — CHLORHEXIDINE GLUCONATE CLOTH 2 % EX PADS
6.0000 | MEDICATED_PAD | Freq: Every day | CUTANEOUS | Status: DC
  Administered 2015-04-24 – 2015-04-26 (×3): 6 via TOPICAL

## 2015-04-23 NOTE — Progress Notes (Signed)
SUBJECTIVE: The patient is doing well today.   + headache and neck pain (he says is his anginal equivalent).  At this time, he denies chest pain, shortness of breath, or any new concerns.  Marland Kitchen. aspirin EC  81 mg Oral Daily  . atorvastatin  80 mg Oral q1800  . carvedilol  12.5 mg Oral BID  . insulin aspart  0-9 Units Subcutaneous TID WC  . lisinopril  10 mg Oral Daily  . PARoxetine  40 mg Oral Daily   . heparin 1,250 Units/hr (04/23/15 0600)    OBJECTIVE: Physical Exam: Filed Vitals:   04/23/15 0314 04/23/15 0344 04/23/15 0414 04/23/15 0746  BP: 108/73 117/67 125/68 157/88  Pulse:   65 81  Temp:   98 F (36.7 C) 98 F (36.7 C)  TempSrc:   Oral Oral  Resp: 15 13 15 15   Height:      Weight:   179 lb 3.2 oz (81.285 kg)   SpO2:   98% 95%    Intake/Output Summary (Last 24 hours) at 04/23/15 0911 Last data filed at 04/23/15 0746  Gross per 24 hour  Intake    540 ml  Output    975 ml  Net   -435 ml    Telemetry reveals sinus rhythm  GEN- The patient is well appearing, alert and oriented x 3 today.   Head- normocephalic, atraumatic Eyes-  Sclera clear, conjunctiva pink Ears- hearing intact Oropharynx- clear Neck- supple,   Lungs- Clear to ausculation bilaterally, normal work of breathing Heart- Regular rate and rhythm, no murmurs, rubs or gallops, PMI not laterally displaced GI- soft, NT, ND, + BS Extremities- no clubbing, cyanosis, or edema Skin- no rash or lesion Psych- euthymic mood, full affect Neuro- strength and sensation are intact  LABS: Basic Metabolic Panel:  Recent Labs  21/30/8612/30/16 1845 04/23/15 0509  NA 138 138  K 3.7 3.5  CL 103 104  CO2 25 26  GLUCOSE 153* 134*  BUN 15 13  CREATININE 0.82 0.76  CALCIUM 9.1 8.5*   Liver Function Tests: No results for input(s): AST, ALT, ALKPHOS, BILITOT, PROT, ALBUMIN in the last 72 hours. No results for input(s): LIPASE, AMYLASE in the last 72 hours. CBC:  Recent Labs  04/22/15 1845 04/23/15 0509  WBC  6.8 6.3  HGB 14.6 13.7  HCT 41.5 39.6  MCV 84.3 85.2  PLT 226 190   Cardiac Enzymes:  Recent Labs  04/23/15 0013 04/23/15 0509  TROPONINI <0.03 <0.03   Recent Labs  04/23/15 0509  CHOL 249*  HDL 31*  LDLCALC UNABLE TO CALCULATE IF TRIGLYCERIDE OVER 400 mg/dL  TRIG [id number]*  CHOLHDL 8.0   RADIOLOGY: Dg Chest 2 View  04/22/2015  CLINICAL DATA:  Pt brought in EMS for a frontal HA that radiates down bilateral arms with left sided chest pain. Symptoms have been present intermittently x2 weeks. Hx MI, HTN controlled with medication, diabetes and ex-smoker as of 20 years ago. EXAM: CHEST  2 VIEW COMPARISON:  None. FINDINGS: The heart size and mediastinal contours are within normal limits. Both lungs are clear. The visualized skeletal structures are unremarkable. IMPRESSION: No active cardiopulmonary disease. Electronically Signed   By: Norva PavlovElizabeth  Brown M.D.   On: 04/22/2015 18:32    ASSESSMENT AND PLAN:  Active Problems:   ACS (acute coronary syndrome) ([facility])  33M hx of known CAD s/p prior PCI, with chronic occulsion of mid RCA with good collaterals, last PCI to mid LAD 04/2013, with moderate LV systolic  dysfunction, HTN, HLD, DM who presents with ACS/Unstable angina.   1. ACS/Unstable Angina and known CAD I agree with Dr Terressa Koyanagi that cath is indicated Will continue IV heparin and medicine titration over the weekend Echo pending  2. Hypertension Continue to titrate medicines over the weekend  3. Hyperlipidemia On statin  4. Diabetes Mellitus Will follow Metformin on hold for cath  Hillis Range, MD 04/23/2015 9:11 AM

## 2015-04-23 NOTE — Progress Notes (Signed)
  Echocardiogram 2D Echocardiogram has been performed.  Justin Galvan, Justin Galvan 04/23/2015, 10:40 AM

## 2015-04-23 NOTE — Progress Notes (Signed)
ANTICOAGULATION CONSULT NOTE - Follow Up Consult  Pharmacy Consult for Heparin  Indication: chest pain/ACS  No Known Allergies  Patient Measurements: Height: 5\' 7"  (170.2 cm) Weight: 182 lb 14.4 oz (82.963 kg) IBW/kg (Calculated) : 66.1  Vital Signs: Temp: 98.3 F (36.8 C) (12/30 2305) Temp Source: Oral (12/30 2305) BP: 125/68 mmHg (12/31 0414) Pulse Rate: 75 (12/30 2305)  Labs:  Recent Labs  04/22/15 1845 04/23/15 0013 04/23/15 0359  HGB 14.6  --   --   HCT 41.5  --   --   PLT 226  --   --   LABPROT  --   --  13.8  INR  --   --  1.04  HEPARINUNFRC  --   --  0.30  CREATININE 0.82  --   --   TROPONINI  --  <0.03  --     Estimated Creatinine Clearance: 103.7 mL/min (by C-G formula based on Cr of 0.82).  Assessment: 54 y/o M with significant cardiac history here with CP/HA. Initial troponin is negative. Initial heparin level is on low end of therapeutic range.   Goal of Therapy:  Heparin level 0.3-0.7 units/ml Monitor platelets by anticoagulation protocol: Yes   Plan:  -Increase heparin to 1250 units/hr to prevent sub-therapeutic level -1200 confirmatory HL  -F/U cardiology plans  Abran DukeLedford, Omaya Nieland 04/23/2015,4:36 AM

## 2015-04-23 NOTE — Plan of Care (Signed)
Problem: Phase I Progression Outcomes Goal: Hemodynamically stable Outcome: Progressing Patient blood pressure has improved with rest and medications. Goal: Anginal pain relieved Outcome: Progressing Patient not experiencing any chest pain at this time. Goal: Aspirin unless contraindicated Outcome: Completed/Met Date Met:  04/23/15 Patient took aspirin before admission to hospital. Goal: Voiding-avoid urinary catheter unless indicated Outcome: Progressing Patient able to void into urinal at bedside with no issues.

## 2015-04-23 NOTE — Progress Notes (Signed)
ANTICOAGULATION CONSULT NOTE - Follow Up Consult  Pharmacy Consult for Heparin  Indication: chest pain/ACS  No Known Allergies  Patient Measurements: Height: 5\' 7"  (170.2 cm) Weight: 179 lb 3.2 oz (81.285 kg) IBW/kg (Calculated) : 66.1  Vital Signs: Temp: 98 F (36.7 C) (12/31 0746) Temp Source: Oral (12/31 0746) BP: 157/88 mmHg (12/31 0746) Pulse Rate: 81 (12/31 0746)  Labs:  Recent Labs  04/22/15 1845 04/23/15 0013 04/23/15 0359 04/23/15 0509 04/23/15 1240  HGB 14.6  --   --  13.7  --   HCT 41.5  --   --  39.6  --   PLT 226  --   --  190  --   LABPROT  --   --  13.8  --   --   INR  --   --  1.04  --   --   HEPARINUNFRC  --   --  0.30  --  0.38  CREATININE 0.82  --   --  0.76  --   TROPONINI  --  <0.03  --  <0.03 <0.03    Estimated Creatinine Clearance: 105.3 mL/min (by C-G formula based on Cr of 0.76).  Assessment: 54 y/o M with significant cardiac history here with CP/HA. Troponin is negative.   Follow up heparin level is at goal. No bleeding issues noted, cbc normal.  Goal of Therapy:  Heparin level 0.3-0.7 units/ml Monitor platelets by anticoagulation protocol: Yes   Plan:  -Continue heparin at 1250 units/hr  -F/U cardiology plans  Sheppard CoilFrank Wilson PharmD., BCPS Clinical Pharmacist Pager [phone] 04/23/2015 2:20 PM

## 2015-04-24 LAB — HEPARIN LEVEL (UNFRACTIONATED)
Heparin Unfractionated: >2.2 IU/mL — ABNORMAL HIGH (ref 0.30–0.70)
Heparin Unfractionated: 0.32 IU/mL (ref 0.30–0.70)
Heparin Unfractionated: 0.21 IU/mL — ABNORMAL LOW (ref 0.30–0.70)

## 2015-04-24 LAB — CBC
HCT: 40 % (ref 39.0–52.0)
Hemoglobin: 13.4 g/dL (ref 13.0–17.0)
MCH: 28.8 pg (ref 26.0–34.0)
MCHC: 33.5 g/dL (ref 30.0–36.0)
MCV: 85.8 fL (ref 78.0–100.0)
Platelets: 208 10*3/uL (ref 150–400)
RBC: 4.66 MIL/uL (ref 4.22–5.81)
RDW: 13 % (ref 11.5–15.5)
WBC: 5.7 10*3/uL (ref 4.0–10.5)

## 2015-04-24 LAB — GLUCOSE, CAPILLARY
Glucose-Capillary: 234 mg/dL — ABNORMAL HIGH (ref 65–99)
Glucose-Capillary: 140 mg/dL — ABNORMAL HIGH (ref 65–99)
Glucose-Capillary: 171 mg/dL — ABNORMAL HIGH (ref 65–99)
Glucose-Capillary: 205 mg/dL — ABNORMAL HIGH (ref 65–99)

## 2015-04-24 MED ORDER — LISINOPRIL 20 MG PO TABS
20.0000 mg | ORAL_TABLET | Freq: Every day | ORAL | Status: DC
  Administered 2015-04-25 – 2015-04-26 (×2): 20 mg via ORAL
  Filled 2015-04-24 (×2): qty 1

## 2015-04-24 MED ORDER — CARVEDILOL 25 MG PO TABS
25.0000 mg | ORAL_TABLET | Freq: Two times a day (BID) | ORAL | Status: DC
  Administered 2015-04-24 – 2015-04-26 (×4): 25 mg via ORAL
  Filled 2015-04-24 (×4): qty 1

## 2015-04-24 NOTE — Progress Notes (Signed)
ANTICOAGULATION CONSULT NOTE - Follow Up Consult  Pharmacy Consult for Heparin  Indication: chest pain/ACS  No Known Allergies  Patient Measurements: Height: 5\' 7"  (170.2 cm) Weight: 179 lb 9.6 oz (81.466 kg) IBW/kg (Calculated) : 66.1  Vital Signs: Temp: 97.8 F (36.6 C) (01/01 0600) Temp Source: Oral (01/01 0600) BP: 166/91 mmHg (01/01 0600)  Labs:  Recent Labs  04/22/15 1845 04/23/15 0013  04/23/15 0359 04/23/15 0509 04/23/15 1240 04/24/15 0549 04/24/15 1045  HGB 14.6  --   --   --  13.7  --  13.4  --   HCT 41.5  --   --   --  39.6  --  40.0  --   PLT 226  --   --   --  190  --  208  --   LABPROT  --   --   --  13.8  --   --   --   --   INR  --   --   --  1.04  --   --   --   --   HEPARINUNFRC  --   --   < > 0.30  --  0.38 >2.20* 0.32  CREATININE 0.82  --   --   --  0.76  --   --   --   TROPONINI  --  <0.03  --   --  <0.03 <0.03  --   --   < > = values in this interval not displayed.  Estimated Creatinine Clearance: 105.4 mL/min (by C-G formula based on Cr of 0.76).  Assessment: 55 y/o M with significant cardiac history here with CP/HA. Troponin is negative.   HL previously therapeutic x2 however, was >2.20 with AM labs today. Checked infusion and verified correct rate. Ordered stat HL and returned therapeutic at 0.32. Will slightly increase rate to ensure patient remains in therapeutic range. SCr, H/H and plt stable. No bleeding noted.    Goal of Therapy:  Heparin level 0.3-0.7 units/ml Monitor platelets by anticoagulation protocol: Yes   Plan:  Increase heparin to 1350 units/hr 6hr confirmatory HL Daily HL and CBC Monitor renal fcn, s/sx bleeding F/U cardiology plans  Sherle Poeob Keyandre Pileggi, PharmD Clinical Pharmacy Resident 04/24/2015 11:45 AM

## 2015-04-24 NOTE — Plan of Care (Signed)
Problem: Phase II Progression Outcomes Goal: Anginal pain relieved Outcome: Completed/Met Date Met:  04/24/15 Pt has no c/o of pain. RN encouraged pt to walk around the unit today

## 2015-04-24 NOTE — Plan of Care (Signed)
Problem: Phase II Progression Outcomes Goal: Hemodynamically stable Outcome: Progressing Patient medications altered by MD in attempts to control Blood Pressure within normal limits. Goal: Cath/PCI Day Path if indicated Outcome: Progressing CATH/PCI path initiated Goal: CV Risk Factors identified Outcome: Progressing Patient has significant prior cardiac history and high lipid levels this admission.

## 2015-04-24 NOTE — Progress Notes (Signed)
ANTICOAGULATION CONSULT NOTE - Follow Up Consult  Pharmacy Consult for Heparin  Indication: chest pain/ACS  No Known Allergies  Patient Measurements: Height: 5\' 7"  (170.2 cm) Weight: 179 lb 9.6 oz (81.466 kg) IBW/kg (Calculated) : 66.1  Vital Signs: Temp: 98 F (36.7 C) (01/01 1525) Temp Source: Oral (01/01 1525) BP: 167/91 mmHg (01/01 1525) Pulse Rate: 71 (01/01 1525)  Labs:  Recent Labs  04/22/15 1845 04/23/15 0013  04/23/15 0359 04/23/15 0509 04/23/15 1240 04/24/15 0549 04/24/15 1045 04/24/15 1804  HGB 14.6  --   --   --  13.7  --  13.4  --   --   HCT 41.5  --   --   --  39.6  --  40.0  --   --   PLT 226  --   --   --  190  --  208  --   --   LABPROT  --   --   --  13.8  --   --   --   --   --   INR  --   --   --  1.04  --   --   --   --   --   HEPARINUNFRC  --   --   < > 0.30  --  0.38 >2.20* 0.32 0.21*  CREATININE 0.82  --   --   --  0.76  --   --   --   --   TROPONINI  --  <0.03  --   --  <0.03 <0.03  --   --   --   < > = values in this interval not displayed.  Estimated Creatinine Clearance: 105.4 mL/min (by C-G formula based on Cr of 0.76).  Assessment: 55 y/o M with significant cardiac history here with CP/HA. Troponin is negative.   Follow up heparin level is subtherapeutic. Discussed with TurkeyVictoria, RN - no issues noted with infusion.  No bleeding issues noted, cbc normal.  Plans noted for cath on Tuesday.  Goal of Therapy:  Heparin level 0.3-0.7 units/ml Monitor platelets by anticoagulation protocol: Yes   Plan:  Increase Heparin to 1550 units/hr Check Heparin level in 6 hours  Estella HuskMichelle Arminda Foglio, Pharm.D., BCPS, AAHIVP Clinical Pharmacist Phone: [phone] or [phone] 04/24/2015, 6:49 PM

## 2015-04-24 NOTE — Progress Notes (Signed)
SUBJECTIVE: The patient is doing well today.  headache and neck pain are improved.  At this time, he denies chest pain, shortness of breath, or any new concerns.  Marland Kitchen aspirin EC  81 mg Oral Daily  . atorvastatin  80 mg Oral q1800  . carvedilol  12.5 mg Oral BID  . Chlorhexidine Gluconate Cloth  6 each Topical Q0600  . insulin aspart  0-9 Units Subcutaneous TID WC  . lisinopril  10 mg Oral Daily  . mupirocin ointment  1 application Nasal BID  . PARoxetine  40 mg Oral Daily   . heparin 1,250 Units/hr (04/23/15 1414)    OBJECTIVE: Physical Exam: Filed Vitals:   04/23/15 2045 04/23/15 2132 04/23/15 2314 04/24/15 0600  BP: 154/83  146/96 166/91  Pulse: 77 79    Temp: 98.4 F (36.9 C)  97.8 F (36.6 C) 97.8 F (36.6 C)  TempSrc: Oral  Oral Oral  Resp: 18  15 14   Height:      Weight:    179 lb 9.6 oz (81.466 kg)  SpO2: 96%  97% 98%    Intake/Output Summary (Last 24 hours) at 04/24/15 0859 Last data filed at 04/24/15 0808  Gross per 24 hour  Intake 1892.5 ml  Output   1750 ml  Net  142.5 ml    Telemetry reveals sinus rhythm  GEN- The patient is well appearing, alert and oriented x 3 today.   Head- normocephalic, atraumatic Eyes-  Sclera clear, conjunctiva pink Ears- hearing intact Oropharynx- clear Neck- supple,   Lungs- Clear to ausculation bilaterally, normal work of breathing Heart- Regular rate and rhythm, no murmurs, rubs or gallops, PMI not laterally displaced GI- soft, NT, ND, + BS Extremities- no clubbing, cyanosis, or edema Skin- no rash or lesion Psych- euthymic mood, full affect Neuro- strength and sensation are intact  LABS: Basic Metabolic Panel:  Recent Labs  16/10/96 1845 04/23/15 0509  NA 138 138  K 3.7 3.5  CL 103 104  CO2 25 26  GLUCOSE 153* 134*  BUN 15 13  CREATININE 0.82 0.76  CALCIUM 9.1 8.5*   Liver Function Tests: No results for input(s): AST, ALT, ALKPHOS, BILITOT, PROT, ALBUMIN in the last 72 hours. No results for input(s):  LIPASE, AMYLASE in the last 72 hours. CBC:  Recent Labs  04/23/15 0509 04/24/15 0549  WBC 6.3 5.7  HGB 13.7 13.4  HCT 39.6 40.0  MCV 85.2 85.8  PLT 190 208   Cardiac Enzymes:  Recent Labs  04/23/15 0013 04/23/15 0509 04/23/15 1240  TROPONINI <0.03 <0.03 <0.03    Recent Labs  04/23/15 0509  CHOL 249*  HDL 31*  LDLCALC UNABLE TO CALCULATE IF TRIGLYCERIDE OVER 400 mg/dL  TRIG 045*  CHOLHDL 8.0   Echo reveals EF 40% (unchanged from 2007) with moderate LA enlargement  ASSESSMENT AND PLAN:  Active Problems:   ACS (acute coronary syndrome) (HCC)  51M hx of known CAD s/p prior PCI, with chronic occulsion of mid RCA with good collaterals, last PCI to mid LAD 04/2013, with moderate LV systolic dysfunction, HTN, HLD, DM who presents with ACS/Unstable angina.   1. ACS/Unstable Angina and known CAD Will continue IV heparin and medicine titration over the weekend (increase coreg to 25mg  BID today and lisinopril to 20mg  daily today) On cath board for Tuesday  2. Hypertension Elevated Increase coreg and lisinopril as above  3. Hyperlipidemia On statin  4. Diabetes Mellitus Will follow A1C pending.  May need better glycemic control Metformin  on hold for cath  Hillis RangeJames Jaxtin Raimondo, MD 04/24/2015 8:59 AM

## 2015-04-25 DIAGNOSIS — I25110 Atherosclerotic heart disease of native coronary artery with unstable angina pectoris: Secondary | ICD-10-CM | POA: Insufficient documentation

## 2015-04-25 LAB — CBC
HEMATOCRIT: 40.3 % (ref 39.0–52.0)
Hemoglobin: 14.2 g/dL (ref 13.0–17.0)
MCH: 29.8 pg (ref 26.0–34.0)
MCHC: 35.2 g/dL (ref 30.0–36.0)
MCV: 84.5 fL (ref 78.0–100.0)
PLATELETS: 215 10*3/uL (ref 150–400)
RBC: 4.77 MIL/uL (ref 4.22–5.81)
RDW: 12.7 % (ref 11.5–15.5)
WBC: 6.6 10*3/uL (ref 4.0–10.5)

## 2015-04-25 LAB — GLUCOSE, CAPILLARY
Glucose-Capillary: 189 mg/dL — ABNORMAL HIGH (ref 65–99)
Glucose-Capillary: 119 mg/dL — ABNORMAL HIGH (ref 65–99)
Glucose-Capillary: 111 mg/dL — ABNORMAL HIGH (ref 65–99)
Glucose-Capillary: 130 mg/dL — ABNORMAL HIGH (ref 65–99)

## 2015-04-25 LAB — HEPARIN LEVEL (UNFRACTIONATED)
Heparin Unfractionated: 0.46 IU/mL (ref 0.30–0.70)
Heparin Unfractionated: 0.69 IU/mL (ref 0.30–0.70)

## 2015-04-25 MED ORDER — SODIUM CHLORIDE 0.9 % IJ SOLN
3.0000 mL | INTRAMUSCULAR | Status: DC | PRN
Start: 2015-04-25 — End: 2015-04-26

## 2015-04-25 MED ORDER — SODIUM CHLORIDE 0.9 % IJ SOLN
3.0000 mL | Freq: Two times a day (BID) | INTRAMUSCULAR | Status: DC
  Administered 2015-04-26: 3 mL via INTRAVENOUS

## 2015-04-25 MED ORDER — SODIUM CHLORIDE 0.9 % IV SOLN: 250.0000 mL | INTRAVENOUS | Status: DC | PRN

## 2015-04-25 MED ORDER — ASPIRIN 81 MG PO CHEW
81.0000 mg | CHEWABLE_TABLET | ORAL | Status: AC
  Administered 2015-04-26: 81 mg via ORAL
  Filled 2015-04-25: qty 1

## 2015-04-25 MED ORDER — SODIUM CHLORIDE 0.9 % WEIGHT BASED INFUSION
1.0000 mL/kg/h | INTRAVENOUS | Status: DC
  Administered 2015-04-25: 1 mL/kg/h via INTRAVENOUS

## 2015-04-25 NOTE — Care Management Important Message (Signed)
Important Message  Patient Details  Name: Filbert SchilderMichael C Laskin MRN: [id number] Date of Birth: 04/01/1961   Medicare Important Message Given:  Yes    Gala LewandowskyGraves-Bigelow, Tashi Band Kaye, RN 04/25/2015, 11:01 AM

## 2015-04-25 NOTE — Plan of Care (Signed)
Problem: Phase II Progression Outcomes Goal: Cath/PCI Day Path if indicated Outcome: Completed/Met Date Met:  04/25/15 Pt going to cath lab 04/26/15

## 2015-04-25 NOTE — Progress Notes (Addendum)
ANTICOAGULATION CONSULT NOTE - Follow Up Consult  Pharmacy Consult for Heparin  Indication: chest pain/ACS  No Known Allergies  Patient Measurements: Height: 5\' 7"  (170.2 cm) Weight: 180 lb 11.2 oz (81.965 kg) IBW/kg (Calculated) : 66.1  Heparin dosing weight:  = IBW 81.9kg   Vital Signs: Temp: 97.8 F (36.6 C) (01/02 0533) Temp Source: Oral (01/02 0533) BP: 158/89 mmHg (01/02 0533) Pulse Rate: 65 (01/02 0533)  Labs:  Recent Labs  04/22/15 1845 04/23/15 0013  04/23/15 0359 04/23/15 0509 04/23/15 1240 04/24/15 0549  04/24/15 1804 04/25/15 0120 04/25/15 0720  HGB 14.6  --   --   --  13.7  --  13.4  --   --  14.2  --   HCT 41.5  --   --   --  39.6  --  40.0  --   --  40.3  --   PLT 226  --   --   --  190  --  208  --   --  215  --   LABPROT  --   --   --  13.8  --   --   --   --   --   --   --   INR  --   --   --  1.04  --   --   --   --   --   --   --   HEPARINUNFRC  --   --   < > 0.30  --  0.38 >2.20*  < > 0.21* 0.46 0.69  CREATININE 0.82  --   --   --  0.76  --   --   --   --   --   --   TROPONINI  --  <0.03  --   --  <0.03 <0.03  --   --   --   --   --   < > = values in this interval not displayed.  Estimated Creatinine Clearance: 105.7 mL/min (by C-G formula based on Cr of 0.76).  Assessment: 55 y/o M with significant cardiac history here with CP/HA. Troponin is negative.   Heparin level = 0.69 is therapeutic. No issues noted with infusion.  No bleeding issues noted per RN, cbc normal.  Plans noted for cath on Tuesday.  Goal of Therapy:  Heparin level 0.3-0.7 units/ml Monitor platelets by anticoagulation protocol: Yes   Plan:  Continue Heparin to 1550 units/hr Check Heparin level, CBC daily   Noah Delaineuth Niketa Turner, RPh Clinical Pharmacist Pager: [phone] 04/25/2015 11:01 AM

## 2015-04-25 NOTE — Progress Notes (Signed)
UR Completed Saylee Sherrill Graves-Bigelow, RN,BSN [phone]  

## 2015-04-25 NOTE — Care Management Note (Signed)
Case Management Note  Patient Details  Name: [name] MRN: [id number] Date of Birth: 04/24/1960  Subjective/Objective:  Pt admitted for chest pain. Hx Known CAD. Plan for cath on 04-26-15.                   Action/Plan: No needs identified at this time for disposition.    Expected Discharge Date:                  Expected Discharge Plan:  Home/Self Care  In-House Referral:  NA  Discharge planning Services  CM Consult  Post Acute Care Choice:  NA Choice offered to:  NA  DME Arranged:  N/A DME Agency:  NA  HH Arranged:  NA HH Agency:  NA  Status of Service:  Completed, signed off  Medicare Important Message Given:  Yes Date Medicare IM Given:    Medicare IM give by:    Date Additional Medicare IM Given:    Additional Medicare Important Message give by:     If discussed at Long Length of Stay Meetings, dates discussed:    Additional Comments:  Gala LewandowskyGraves-Bigelow, Justyn Boyson Kaye, RN 04/25/2015, 11:26 AM

## 2015-04-25 NOTE — Plan of Care (Signed)
Problem: Consults Goal: Cardiac Cath Patient Education (See Patient Education module for education specifics.)  Outcome: Completed/Met Date Met:  04/25/15 Pt verbalized understanding of all cath procedure education

## 2015-04-25 NOTE — Progress Notes (Signed)
ANTICOAGULATION CONSULT NOTE - Follow Up Consult  Pharmacy Consult for Heparin  Indication: chest pain/ACS  No Known Allergies  Patient Measurements: Height: 5\' 7"  (170.2 cm) Weight: 179 lb 9.6 oz (81.466 kg) IBW/kg (Calculated) : 66.1  Vital Signs: Temp: 98.3 F (36.8 C) (01/01 2030) Temp Source: Oral (01/01 2030) BP: 157/74 mmHg (01/01 2030) Pulse Rate: 73 (01/01 2030)  Labs:  Recent Labs  04/22/15 1845 04/23/15 0013  04/23/15 0359 04/23/15 0509 04/23/15 1240 04/24/15 0549 04/24/15 1045 04/24/15 1804 04/25/15 0120  HGB 14.6  --   --   --  13.7  --  13.4  --   --  14.2  HCT 41.5  --   --   --  39.6  --  40.0  --   --  40.3  PLT 226  --   --   --  190  --  208  --   --  215  LABPROT  --   --   --  13.8  --   --   --   --   --   --   INR  --   --   --  1.04  --   --   --   --   --   --   HEPARINUNFRC  --   --   < > 0.30  --  0.38 >2.20* 0.32 0.21* 0.46  CREATININE 0.82  --   --   --  0.76  --   --   --   --   --   TROPONINI  --  <0.03  --   --  <0.03 <0.03  --   --   --   --   < > = values in this interval not displayed.  Estimated Creatinine Clearance: 105.4 mL/min (by C-G formula based on Cr of 0.76).  Assessment: 55 y/o M with significant cardiac history here with CP/HA. Troponin is negative.   Follow up heparin level is therapeutic. No issues noted with infusion.  No bleeding issues noted per RN, cbc normal.  Plans noted for cath on Tuesday.  Goal of Therapy:  Heparin level 0.3-0.7 units/ml Monitor platelets by anticoagulation protocol: Yes   Plan:  Continue Heparin to 1550 units/hr Check Heparin level in 6 hours  Vinnie LevelBenjamin Kinga Cassar, PharmD., BCPS Clinical Pharmacist Pager [phone]

## 2015-04-25 NOTE — Progress Notes (Signed)
Pt walked around the unit, with no c/o cp or SOB.

## 2015-04-25 NOTE — Progress Notes (Signed)
     SUBJECTIVE: No neck pain or chest pain this am.   BP 158/89 mmHg  Pulse 65  Temp(Src) 97.8 F (36.6 C) (Oral)  Resp 16  Ht 5\' 7"  (1.702 m)  Wt 180 lb 11.2 oz (81.965 kg)  BMI 28.29 kg/m2  SpO2 95%  Intake/Output Summary (Last 24 hours) at 04/25/15 0755 Last data filed at 04/24/15 2031  Gross per 24 hour  Intake   1451 ml  Output   1400 ml  Net     51 ml    PHYSICAL EXAM General: Well developed, well nourished, in no acute distress. Alert and oriented x 3.  Psych:  Good affect, responds appropriately Neck: No JVD. No masses noted.  Lungs: Clear bilaterally with no wheezes or rhonci noted.  Heart: RRR with no murmurs noted. Abdomen: Bowel sounds are present. Soft, non-tender.  Extremities: No lower extremity edema.   LABS: Basic Metabolic Panel:  Recent Labs  16/01/9611/30/16 1845 04/23/15 0509  NA 138 138  K 3.7 3.5  CL 103 104  CO2 25 26  GLUCOSE 153* 134*  BUN 15 13  CREATININE 0.82 0.76  CALCIUM 9.1 8.5*   CBC:  Recent Labs  04/24/15 0549 04/25/15 0120  WBC 5.7 6.6  HGB 13.4 14.2  HCT 40.0 40.3  MCV 85.8 84.5  PLT 208 215   Cardiac Enzymes:  Recent Labs  04/23/15 0013 04/23/15 0509 04/23/15 1240  TROPONINI <0.03 <0.03 <0.03   Fasting Lipid Panel:  Recent Labs  04/23/15 0509  CHOL 249*  HDL 31*  LDLCALC UNABLE TO CALCULATE IF TRIGLYCERIDE OVER 400 mg/dL  TRIG [id number]*  CHOLHDL 8.0    Current Meds: . aspirin EC  81 mg Oral Daily  . atorvastatin  80 mg Oral q1800  . carvedilol  25 mg Oral BID  . Chlorhexidine Gluconate Cloth  6 each Topical Q0600  . insulin aspart  0-9 Units Subcutaneous TID WC  . lisinopril  20 mg Oral Daily  . mupirocin ointment  1 application Nasal BID  . PARoxetine  40 mg Oral Daily     ASSESSMENT AND PLAN: 55 yo male with history of CAD with prior PCI with known chronic occulsion of mid RCA with good collaterals, last PCI to mid LAD 04/2013 with a DES, with moderate LV systolic dysfunction, HTN, HLD, DM who  presents with Unstable angina.  Troponin negative x 3.   1. Unstable Angina and known CAD: He is stable on IV heparin awaiting cardiac cath on 04/26/15. Continue current meds. NPO at midnight. Repeat BMET in am.   2. Hypertension: BP is still elevated. Coreg and Lisinopril increased yesterday.   3. Hyperlipidemia: Continue statin  4. Diabetes Mellitus: Metformin on hold for cath    [facility] Surgery Center LLCMCALHANY,CHRISTOPHER  1/2/20177:55 AM

## 2015-04-26 ENCOUNTER — Encounter (HOSPITAL_COMMUNITY)
Admission: EM | Disposition: A | Discharge status: Home / Self Care | Payer: 59 | Source: Home / Self Care | Attending: Cardiology

## 2015-04-26 DIAGNOSIS — I200 Unstable angina: Secondary | ICD-10-CM

## 2015-04-26 HISTORY — PX: CARDIAC CATHETERIZATION: SHX172

## 2015-04-26 LAB — CBC
HEMATOCRIT: 41 % (ref 39.0–52.0)
Hemoglobin: 14.3 g/dL (ref 13.0–17.0)
MCH: 29.5 pg (ref 26.0–34.0)
MCHC: 34.9 g/dL (ref 30.0–36.0)
MCV: 84.5 fL (ref 78.0–100.0)
Platelets: 230 10*3/uL (ref 150–400)
RBC: 4.85 MIL/uL (ref 4.22–5.81)
RDW: 12.8 % (ref 11.5–15.5)
WBC: 7.4 10*3/uL (ref 4.0–10.5)

## 2015-04-26 LAB — GLUCOSE, CAPILLARY
Glucose-Capillary: 147 mg/dL — ABNORMAL HIGH (ref 65–99)
Glucose-Capillary: 128 mg/dL — ABNORMAL HIGH (ref 65–99)
Glucose-Capillary: 198 mg/dL — ABNORMAL HIGH (ref 65–99)

## 2015-04-26 LAB — BASIC METABOLIC PANEL
ANION GAP: 8 (ref 5–15)
BUN: 10 mg/dL (ref 6–20)
CALCIUM: 8.5 mg/dL — AB (ref 8.9–10.3)
CHLORIDE: 106 mmol/L (ref 101–111)
CO2: 26 mmol/L (ref 22–32)
Creatinine, Ser: 0.68 mg/dL (ref 0.61–1.24)
GFR calc non Af Amer: >60 mL/min (ref >60)
Glucose, Bld: 142 mg/dL — ABNORMAL HIGH (ref 65–99)
Potassium: 3.6 mmol/L (ref 3.5–5.1)
SODIUM: 140 mmol/L (ref 135–145)

## 2015-04-26 LAB — HEPARIN LEVEL (UNFRACTIONATED)
Heparin Unfractionated: 0.74 IU/mL — ABNORMAL HIGH (ref 0.30–0.70)
Heparin Unfractionated: 0.69 IU/mL (ref 0.30–0.70)

## 2015-04-26 LAB — HEMOGLOBIN A1C
Hgb A1c MFr Bld: 7 % — ABNORMAL HIGH (ref 4.8–5.6)
Mean Plasma Glucose: 154 mg/dL

## 2015-04-26 SURGERY — LEFT HEART CATH AND CORONARY ANGIOGRAPHY
Anesthesia: LOCAL

## 2015-04-26 MED ORDER — LISINOPRIL 20 MG PO TABS
20.0000 mg | ORAL_TABLET | Freq: Every day | ORAL | Status: DC
Start: 2015-04-26 — End: 2015-07-02

## 2015-04-26 MED ORDER — ISOSORBIDE MONONITRATE ER 30 MG PO TB24: 30.0000 mg | ORAL_TABLET | Freq: Every day | ORAL | Status: DC

## 2015-04-26 MED ORDER — SODIUM CHLORIDE 0.9 % IV SOLN: 250.0000 mL | INTRAVENOUS | Status: DC | PRN

## 2015-04-26 MED ORDER — ATORVASTATIN CALCIUM 80 MG PO TABS: 80.0000 mg | ORAL_TABLET | Freq: Every day | ORAL | Status: DC

## 2015-04-26 MED ORDER — VERAPAMIL HCL 2.5 MG/ML IV SOLN
INTRAVENOUS | Status: AC
  Filled 2015-04-26: qty 2

## 2015-04-26 MED ORDER — FENOFIBRATE 160 MG PO TABS
160.0000 mg | ORAL_TABLET | Freq: Every day | ORAL | Status: DC
Start: 2015-04-26 — End: 2015-07-02

## 2015-04-26 MED ORDER — HEPARIN SODIUM (PORCINE) 1000 UNIT/ML IJ SOLN
INTRAMUSCULAR | Status: DC | PRN
  Administered 2015-04-26: 4000 [IU] via INTRAVENOUS

## 2015-04-26 MED ORDER — SODIUM CHLORIDE 0.9 % IJ SOLN: 3.0000 mL | INTRAMUSCULAR | Status: DC | PRN

## 2015-04-26 MED ORDER — IOHEXOL 350 MG/ML SOLN
INTRAVENOUS | Status: DC | PRN
  Administered 2015-04-26: 70 mL via INTRACARDIAC

## 2015-04-26 MED ORDER — CARVEDILOL 25 MG PO TABS: 25.0000 mg | ORAL_TABLET | Freq: Two times a day (BID) | ORAL | Status: DC

## 2015-04-26 MED ORDER — HEPARIN (PORCINE) IN NACL 2-0.9 UNIT/ML-% IJ SOLN
INTRAMUSCULAR | Status: AC
  Filled 2015-04-26: qty 1000

## 2015-04-26 MED ORDER — FENTANYL CITRATE (PF) 100 MCG/2ML IJ SOLN
INTRAMUSCULAR | Status: DC | PRN
  Administered 2015-04-26: 50 ug via INTRAVENOUS

## 2015-04-26 MED ORDER — FENTANYL CITRATE (PF) 100 MCG/2ML IJ SOLN
INTRAMUSCULAR | Status: AC
  Filled 2015-04-26: qty 2

## 2015-04-26 MED ORDER — MIDAZOLAM HCL 2 MG/2ML IJ SOLN
INTRAMUSCULAR | Status: AC
  Filled 2015-04-26: qty 2

## 2015-04-26 MED ORDER — NITROGLYCERIN 1 MG/10 ML FOR IR/CATH LAB
INTRA_ARTERIAL | Status: AC
  Filled 2015-04-26: qty 10

## 2015-04-26 MED ORDER — HEPARIN (PORCINE) IN NACL 2-0.9 UNIT/ML-% IJ SOLN
INTRAMUSCULAR | Status: DC | PRN
  Administered 2015-04-26: 15:00:00 via INTRA_ARTERIAL

## 2015-04-26 MED ORDER — SODIUM CHLORIDE 0.9 % IV SOLN: INTRAVENOUS | Status: AC

## 2015-04-26 MED ORDER — MIDAZOLAM HCL 2 MG/2ML IJ SOLN
INTRAMUSCULAR | Status: DC | PRN
  Administered 2015-04-26: 2 mg via INTRAVENOUS

## 2015-04-26 MED ORDER — LISINOPRIL 20 MG PO TABS: 20.0000 mg | ORAL_TABLET | Freq: Every day | ORAL | Status: DC

## 2015-04-26 MED ORDER — LIDOCAINE HCL (PF) 1 % IJ SOLN
INTRAMUSCULAR | Status: DC | PRN
  Administered 2015-04-26: 15:00:00

## 2015-04-26 MED ORDER — FENOFIBRATE 160 MG PO TABS: 160.0000 mg | ORAL_TABLET | Freq: Every day | ORAL | Status: DC

## 2015-04-26 MED ORDER — HEPARIN SODIUM (PORCINE) 1000 UNIT/ML IJ SOLN
INTRAMUSCULAR | Status: AC
  Filled 2015-04-26: qty 1

## 2015-04-26 MED ORDER — SODIUM CHLORIDE 0.9 % IJ SOLN: 3.0000 mL | Freq: Two times a day (BID) | INTRAMUSCULAR | Status: DC

## 2015-04-26 MED ORDER — LIDOCAINE HCL (PF) 1 % IJ SOLN
INTRAMUSCULAR | Status: AC
  Filled 2015-04-26: qty 30

## 2015-04-26 MED ORDER — ISOSORBIDE MONONITRATE ER 30 MG PO TB24
30.0000 mg | ORAL_TABLET | Freq: Every day | ORAL | Status: DC
  Administered 2015-04-26: 30 mg via ORAL
  Filled 2015-04-26 (×2): qty 1

## 2015-04-26 MED ORDER — FENOFIBRATE 160 MG PO TABS
160.0000 mg | ORAL_TABLET | Freq: Every day | ORAL | Status: DC
  Administered 2015-04-26: 160 mg via ORAL
  Filled 2015-04-26 (×2): qty 1

## 2015-04-26 SURGICAL SUPPLY — 11 items

## 2015-04-26 NOTE — H&P (View-Only) (Signed)
Patient Name: Justin Galvan Date of Encounter: 04/26/2015  Primary Cardiologist: Dr. 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 Neck pain and headache was previous anginal equivalent.   CURRENT MEDS . aspirin EC  81 mg Oral Daily  . atorvastatin  80 mg Oral q1800  . carvedilol  25 mg Oral BID  . Chlorhexidine Gluconate Cloth  6 each Topical Q0600  . insulin aspart  0-9 Units Subcutaneous TID WC  . lisinopril  20 mg Oral Daily  . mupirocin ointment  1 application Nasal BID  . PARoxetine  40 mg Oral Daily  . sodium chloride  3 mL Intravenous Q12H    OBJECTIVE  Filed Vitals:   04/25/15 0533 04/25/15 1356 04/25/15 2109 04/26/15 0451  BP: 158/89 146/79 168/89 152/91  Pulse: 65 71 68 67  Temp: 97.8 F (36.6 C) 97.6 F (36.4 C) 98.3 F (36.8 C) 98.6 F (37 C)  TempSrc: Oral Oral Oral Oral  Resp: 16 16 18 18   Height:      Weight: 180 lb 11.2 oz (81.965 kg)   178 lb 8 oz (80.967 kg)  SpO2: 95% 94% 97% 99%    Intake/Output Summary (Last 24 hours) at 04/26/15 0811 Last data filed at 04/26/15 0541  Gross per 24 hour  Intake 2052.86 ml  Output   1700 ml  Net 352.86 ml   Filed Weights   04/24/15 0600 04/25/15 0533 04/26/15 0451  Weight: 179 lb 9.6 oz (81.466 kg) 180 lb 11.2 oz (81.965 kg) 178 lb 8 oz (80.967 kg)    PHYSICAL EXAM  General: Pleasant, NAD. Neuro: Alert and oriented X 3. Moves all extremities spontaneously. Psych: Normal affect. HEENT:  Normal  Neck: Supple without bruits or JVD. Lungs:  Resp regular and unlabored, CTA. Heart: RRR no s3, s4, or murmurs. Abdomen: Soft, non-tender, non-distended, BS + x 4.    Extremities: No clubbing, cyanosis or edema. DP/PT/Radials 2+ and equal bilaterally.  Accessory Clinical Findings  CBC  Recent Labs  04/25/15 0120 04/26/15 0349  WBC 6.6 7.4  HGB 14.2 14.3  HCT 40.3 41.0  MCV 84.5 84.5  PLT 215 230   Basic Metabolic Panel  Recent Labs  04/26/15 0349  NA 140  K 3.6  CL 106  CO2 26  GLUCOSE 142*  BUN 10  CREATININE 0.68  CALCIUM 8.5*   Cardiac Enzymes  Recent Labs  04/23/15 1240  TROPONINI <0.03    TELE NSR with HR 70s    ECG  No new EKG  Echocardiogram 04/23/2015  LV EF: 45%  ------------------------------------------------------------------- Indications:   Chest pain 786.51.  ------------------------------------------------------------------- History:  PMH:  Coronary artery disease. Risk factors: Hypertension. Diabetes mellitus.  ------------------------------------------------------------------- Study Conclusions  - Left ventricle: The cavity size was normal. Wall thickness was increased in a pattern of mild LVH. Systolic function was normal. The estimated ejection fraction was 45%. There is hypokinesis of the basal-midinferior myocardium. Doppler parameters are consistent with abnormal left ventricular relaxation (grade 1 diastolic dysfunction). - Aortic valve: Mildly calcified annulus. Trileaflet; mildly calcified leaflets. - Left atrium: The atrium was moderately dilated. - Right atrium: Central venous pressure (est): 3 mm Hg. -  Atrial septum: No defect or patent foramen ovale was identified. - Tricuspid valve: There was trivial regurgitation. - Pulmonary arteries: Systolic pressure could not be accurately estimated. - Pericardium, extracardiac: There was no pericardial effusion.  Impressions:  - Mild LVH with LVEF approximately 45%, mid to basal inferior hypokinesis. Grade 1 diastolic dysfunction. Moderate left atrial enlargement. Trivial mitral and tricuspid regurgitation.     Radiology/Studies  Dg Chest 2 View  04/22/2015  CLINICAL DATA:  Pt brought in EMS for a frontal HA that radiates down bilateral arms with left sided chest pain. Symptoms have been present intermittently x2 weeks. Hx MI, HTN controlled with medication, diabetes and ex-smoker as of 20 years ago. EXAM: CHEST  2 VIEW COMPARISON:  None. FINDINGS: The heart size and mediastinal contours are within normal limits. Both lungs are clear. The visualized skeletal structures are unremarkable. IMPRESSION: No active cardiopulmonary disease. Electronically Signed   By: Norva PavlovElizabeth  Brown M.D.   On: 04/22/2015 18:32    ASSESSMENT AND PLAN  55 yo with PMH of HTN, HLD, DM and prior CAD presented with unstable angina. Trop negative x 3  1. Unstable angina  - 2 weeks of exertional symptom.   - pending cath today, likely this afternoon, confirmed with cath lab, he is on board. Risk and benefit of procedure explained to the patient who display clear understanding and agree to proceed. Discussed with patient possible procedural risk include bleeding, vascular injury, renal injury, arrythmia, MI, stroke and loss of limb or life.  2. CAD: s/p stent to RCA in 1997, DES x 2 to prox to mid RCA in 2005.   - last cath 05/06/2013 Mid LAD 90 (hazy), ost Dx 80, prox RCA stent 50 ISR, mid RCA 100 (L-R collats), inf HK, EF 40-45%. LAD FFR 0.91 => 0.80. PCI: Promus (3 x 12 mm) DES to the mid LAD  - Echo 04/23/2015 EF 45%, hypokinesis of basal mid inferior myocardium, grade 1 DD   3. HTN: BP still high after increasing coreg and lisinopril, will add low dose Imdur 30mg  daily.  4. Severe HLD: uncontrolled, last lipid panel 04/23/2015 Chol 249, trig 487, HDL 31, unable to calculate LDL. On high dose lipitor now, on Vytorin before. Given severely elevated trig level, will also add finofibrate.    5. DM: hgb A1C 7.0 on 04/23/2015, need better control.  Ramond DialSigned, Meng, Hao PA-C Pager: [phone]  Attending Note:   The patient  was seen and examined.  Agree with assessment and plan as noted above.  Changes made to the above note as needed.  Pt has hx of CAD in the past. On for cath today .  Has had caths in the past.      Agree with staring lipitor   Vesta MixerPhilip J. Breon Rehm, Montez HagemanJr., MD, [facility] Maass Medical CenterFACC 04/26/2015, 9:40 AM [address],  [address] Office [phone] Pager [phone]

## 2015-04-26 NOTE — Progress Notes (Signed)
Patient Name: Justin Galvan Date of Encounter: 04/26/2015  Primary Cardiologist: Dr. 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 Neck pain and headache was previous anginal equivalent.   CURRENT MEDS . aspirin EC  81 mg Oral Daily  . atorvastatin  80 mg Oral q1800  . carvedilol  25 mg Oral BID  . Chlorhexidine Gluconate Cloth  6 each Topical Q0600  . insulin aspart  0-9 Units Subcutaneous TID WC  . lisinopril  20 mg Oral Daily  . mupirocin ointment  1 application Nasal BID  . PARoxetine  40 mg Oral Daily  . sodium chloride  3 mL Intravenous Q12H    OBJECTIVE  Filed Vitals:   04/25/15 0533 04/25/15 1356 04/25/15 2109 04/26/15 0451  BP: 158/89 146/79 168/89 152/91  Pulse: 65 71 68 67  Temp: 97.8 F (36.6 C) 97.6 F (36.4 C) 98.3 F (36.8 C) 98.6 F (37 C)  TempSrc: Oral Oral Oral Oral  Resp: 16 16 18 18   Height:      Weight: 180 lb 11.2 oz (81.965 kg)   178 lb 8 oz (80.967 kg)  SpO2: 95% 94% 97% 99%    Intake/Output Summary (Last 24 hours) at 04/26/15 0811 Last data filed at 04/26/15 0541  Gross per 24 hour  Intake 2052.86 ml  Output   1700 ml  Net 352.86 ml   Filed Weights   04/24/15 0600 04/25/15 0533 04/26/15 0451  Weight: 179 lb 9.6 oz (81.466 kg) 180 lb 11.2 oz (81.965 kg) 178 lb 8 oz (80.967 kg)    PHYSICAL EXAM  General: Pleasant, NAD. Neuro: Alert and oriented X 3. Moves all extremities spontaneously. Psych: Normal affect. HEENT:  Normal  Neck: Supple without bruits or JVD. Lungs:  Resp regular and unlabored, CTA. Heart: RRR no s3, s4, or murmurs. Abdomen: Soft, non-tender, non-distended, BS + x 4.    Extremities: No clubbing, cyanosis or edema. DP/PT/Radials 2+ and equal bilaterally.  Accessory Clinical Findings  CBC  Recent Labs  04/25/15 0120 04/26/15 0349  WBC 6.6 7.4  HGB 14.2 14.3  HCT 40.3 41.0  MCV 84.5 84.5  PLT 215 230   Basic Metabolic Panel  Recent Labs  04/26/15 0349  NA 140  K 3.6  CL 106  CO2 26  GLUCOSE 142*  BUN 10  CREATININE 0.68  CALCIUM 8.5*   Cardiac Enzymes  Recent Labs  04/23/15 1240  TROPONINI <0.03    TELE NSR with HR 70s    ECG  No new EKG  Echocardiogram 04/23/2015  LV EF: 45%  ------------------------------------------------------------------- Indications:   Chest pain 786.51.  ------------------------------------------------------------------- History:  PMH:  Coronary artery disease. Risk factors: Hypertension. Diabetes mellitus.  ------------------------------------------------------------------- Study Conclusions  - Left ventricle: The cavity size was normal. Wall thickness was increased in a pattern of mild LVH. Systolic function was normal. The estimated ejection fraction was 45%. There is hypokinesis of the basal-midinferior myocardium. Doppler parameters are consistent with abnormal left ventricular relaxation (grade 1 diastolic dysfunction). - Aortic valve: Mildly calcified annulus. Trileaflet; mildly calcified leaflets. - Left atrium: The atrium was moderately dilated. - Right atrium: Central venous pressure (est): 3 mm Hg. -  Atrial septum: No defect or patent foramen ovale was identified. - Tricuspid valve: There was trivial regurgitation. - Pulmonary arteries: Systolic pressure could not be accurately estimated. - Pericardium, extracardiac: There was no pericardial effusion.  Impressions:  - Mild LVH with LVEF approximately 45%, mid to basal inferior hypokinesis. Grade 1 diastolic dysfunction. Moderate left atrial enlargement. Trivial mitral and tricuspid regurgitation.     Radiology/Studies  Dg Chest 2 View  04/22/2015  CLINICAL DATA:  Pt brought in EMS for a frontal HA that radiates down bilateral arms with left sided chest pain. Symptoms have been present intermittently x2 weeks. Hx MI, HTN controlled with medication, diabetes and ex-smoker as of 20 years ago. EXAM: CHEST  2 VIEW COMPARISON:  None. FINDINGS: The heart size and mediastinal contours are within normal limits. Both lungs are clear. The visualized skeletal structures are unremarkable. IMPRESSION: No active cardiopulmonary disease. Electronically Signed   By: Norva PavlovElizabeth  Brown M.D.   On: 04/22/2015 18:32    ASSESSMENT AND PLAN  55 yo with PMH of HTN, HLD, DM and prior CAD presented with unstable angina. Trop negative x 3  1. Unstable angina  - 2 weeks of exertional symptom.   - pending cath today, likely this afternoon, confirmed with cath lab, he is on board. Risk and benefit of procedure explained to the patient who display clear understanding and agree to proceed. Discussed with patient possible procedural risk include bleeding, vascular injury, renal injury, arrythmia, MI, stroke and loss of limb or life.  2. CAD: s/p stent to RCA in 1997, DES x 2 to prox to mid RCA in 2005.   - last cath 05/06/2013 Mid LAD 90 (hazy), ost Dx 80, prox RCA stent 50 ISR, mid RCA 100 (L-R collats), inf HK, EF 40-45%. LAD FFR 0.91 => 0.80. PCI: Promus (3 x 12 mm) DES to the mid LAD  - Echo 04/23/2015 EF 45%, hypokinesis of basal mid inferior myocardium, grade 1 DD   3. HTN: BP still high after increasing coreg and lisinopril, will add low dose Imdur 30mg  daily.  4. Severe HLD: uncontrolled, last lipid panel 04/23/2015 Chol 249, trig 487, HDL 31, unable to calculate LDL. On high dose lipitor now, on Vytorin before. Given severely elevated trig level, will also add finofibrate.    5. DM: hgb A1C 7.0 on 04/23/2015, need better control.  Ramond DialSigned, Meng, Hao PA-C Pager: [phone]  Attending Note:   The patient  was seen and examined.  Agree with assessment and plan as noted above.  Changes made to the above note as needed.  Pt has hx of CAD in the past. On for cath today .  Has had caths in the past.      Agree with staring lipitor   Vesta MixerPhilip J. Yarimar Lavis, Montez HagemanJr., MD, [facility] 04/26/2015, 9:40 AM [address],  [address] Office [phone] Pager [phone]

## 2015-04-26 NOTE — Discharge Summary (Signed)
Discharge Summary   Patient ID: Justin SchilderMichael C Rochford,  MRN: [id number], DOB/AGE: 57/12/1960 57 y.o.  Admit date: 04/22/2015 Discharge date: 04/26/2015  Primary Care Provider: Sanda Lingerhomas Jones Primary Cardiologist: Dr. 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  ------------------------------------------------------------------- History:  PMH:  Coronary artery disease. Risk factors: Hypertension. Diabetes mellitus.  ------------------------------------------------------------------- Study Conclusions  - Left ventricle: The cavity size was normal. Wall thickness was increased in a pattern of mild LVH. Systolic function was normal. The estimated ejection fraction was 45%. There is hypokinesis of the basal-midinferior myocardium. Doppler parameters are consistent with abnormal left ventricular relaxation (grade 1 diastolic dysfunction). - Aortic valve: Mildly calcified annulus. Trileaflet; mildly calcified leaflets. - Left atrium: The atrium was moderately dilated. - Right atrium: Central venous pressure (est): 3 mm Hg. - Atrial septum: No defect or patent foramen ovale was identified. - Tricuspid valve: There was trivial regurgitation. - Pulmonary arteries: Systolic pressure could not be accurately estimated. - Pericardium, extracardiac: There was no pericardial effusion.  Impressions:  - Mild LVH with LVEF approximately 45%, mid to basal inferior hypokinesis. Grade 1 diastolic  dysfunction. Moderate left atrial enlargement. Trivial mitral and tricuspid regurgitation.    Cardiac catheterization 04/25/2014 Conclusion    1. Double vessel CAD. ] 2. Patent stent mid LAD with no restenosis.  3. Severe disease very small diagonal branch at the ostium where the vessel is jailed by the LAD stent. Too small for PCI.  4. Chronic occlusion mid RCA. The distal vessel fills from left to right collaterals.  5. Mild segmental LV systolic dysfunction.   Recommendations. Continue medical management of CAD. Imdur was added. Discharge home today. Follow up with me or office APP 2-3 weeks.       Hospital Course  She has a 55 year old male with past medical history of HTN, HLD, DM and CAD. He had multiple PCI in the past including stent to his RCA in 1997 and DES 2 to proximal to mid RCA in 2005. His last cardiac catheterization was in January 2015 which showed Mid LAD 90 (hazy), ost Dx 80, prox RCA stent 50 ISR, mid RCA 100 (L-R collats), inf HK, EF 40-45%. LAD FFR 0.91 => 0.80. PCI: Promus (3 x 12 mm) DES to the mid LAD.  [facility] with 2 weeks onset of intermittent symptom mainly with exertion. He has left-sided neck and chest pain with headache and bilateral bilateral arm pain with exertion relieved with rest. The symptom was reminiscent of his previous anginal symptoms. He is no longer taking Plavix for at least 6 months despite it is listed as his home medication. He is also not taking any statin medication. He was admitted to cardiology service with plan for cardiac catheterization. Lipid panel was obtained on 12/31 which showed severely uncontrolled cholesterol profile including total cholesterol 249, triglyceride 487, HDL 31, unable to calculate LDL. Hemoglobin A1c was 7.0. Echocardiogram was obtained on 12/31 which showed EF 45%, hypokinesis of basal mid inferior myocardium, grade 1 salt dysfunction. He underwent planned cardiac  catheterization on 1/3 which showed double vessel CAD with patent stent  in mid LAD with no restenosis, severe disease in very small diagonal branch at the ostium where the vessel was GI by LAD stent, the vessel was too small for PCI, chronic occlusion of mid RCA with distal vessel filled from left-to-right collaterals.  Post-cath, he denies any chest discomfort. He is deemed stable for discharge from cardiology perspective. I have arranged 2-3 weeks office visit. I have also added fenofibrate to his medical regimen along with Imdur for antianginal purposes.    Discharge Vitals Blood pressure 111/58, pulse 77, temperature 98.4 F (36.9 C), temperature source Oral, resp. rate 18, height 5\' 7"  (1.702 m), weight 178 lb 8 oz (80.967 kg), SpO2 93 %.  Filed Weights   04/24/15 0600 04/25/15 0533 04/26/15 0451  Weight: 179 lb 9.6 oz (81.466 kg) 180 lb 11.2 oz (81.965 kg) 178 lb 8 oz (80.967 kg)    Labs  CBC  Recent Labs  04/25/15 0120 04/26/15 0349  WBC 6.6 7.4  HGB 14.2 14.3  HCT 40.3 41.0  MCV 84.5 84.5  PLT 215 230   Basic Metabolic Panel  Recent Labs  04/26/15 0349  NA 140  K 3.6  CL 106  CO2 26  GLUCOSE 142*  BUN 10  CREATININE 0.68  CALCIUM 8.5*   Disposition  Pt is being discharged home today in good condition.  Follow-up Plans & Appointments      Follow-up Information    Follow up with Robbie Lis, PA-C On 05/10/2015.   Specialties:  Cardiology, Radiology   Why:  @2 :00PM   Contact information:   [address] [place] [address] [phone]       Follow up with Sanda Linger, MD.   Specialty:  Internal Medicine   Why:  Continue to followup with your primary care provider to help manage diabetes and high cholesterol and triglyceride   Contact information:   [address] [place] [address] [phone]       Discharge Medications    Medication List    STOP taking these medications        clopidogrel 75 MG  tablet  Commonly known as:  PLAVIX     ezetimibe-simvastatin 10-40 MG tablet  Commonly known as:  VYTORIN     quinapril-hydrochlorothiazide 20-12.5 MG tablet  Commonly known as:  ACCURETIC      TAKE these medications        aspirin EC 81 MG tablet  Take 81 mg by mouth daily.     atorvastatin 80 MG tablet  Commonly known as:  LIPITOR  Take 1 tablet (80 mg total) by mouth daily at 6 PM.     carvedilol 25 MG tablet  Commonly known as:  COREG  Take 1 tablet (25 mg total) by mouth 2 (two) times daily.     empagliflozin 10 MG Tabs tablet  Commonly known as:  JARDIANCE  Take 10 mg by mouth daily.     fenofibrate 160 MG tablet  Take 1 tablet (160 mg total) by mouth daily.     glucose blood test strip  Test up to two times daily     Icosapent Ethyl 1 g Caps  Commonly known as:  VASCEPA  Take 2 capsules by mouth 2 (two) times daily.     Insulin Detemir 100 UNIT/ML Pen  Commonly known as:  LEVEMIR FLEXPEN  Inject 60 Units into the skin daily at 10 pm.     isosorbide mononitrate 30 MG 24 hr tablet  Commonly known as:  IMDUR  Take 1 tablet (30 mg total) by mouth daily.     lisinopril 20 MG tablet  Commonly known as:  PRINIVIL,ZESTRIL  Take 1 tablet (20 mg total) by mouth daily.     nitroGLYCERIN 0.4 MG SL tablet  Commonly known as:  NITROSTAT  Place 1 tablet (0.4 mg total) under the tongue every 5 (five) minutes as needed for chest pain.     ONETOUCH DELICA LANCETS 33G Misc  Test up to two times daily     PARoxetine 40 MG tablet  Commonly known as:  PAXIL  TAKE 1 TABLET (40 MG TOTAL) BY MOUTH DAILY.     SitaGLIPtin-MetFORMIN HCl [phone] MG Tb24  Commonly known as:  JANUMET XR  Take 1 tablet by mouth daily.         Duration of Discharge Encounter   Greater than 30 minutes including physician time.  Ramond Dial PA-C Pager: [phone] 04/26/2015, 4:27 PM   Attending Note:   The patient was seen and examined.  Agree with assessment and plan as noted  above.  Changes made to the above note as needed.  Pt had a cath that revealed :  1. Double vessel CAD. ] 2. Patent stent mid LAD with no restenosis.  3. Severe disease very small diagonal branch at the ostium where the vessel is jailed by the LAD stent. Too small for PCI.  4. Chronic occlusion mid RCA. The distal vessel fills from left to right collaterals.  5. Mild segmental LV systolic dysfunction.   Recommendations. Continue medical management of CAD. Imdur was added. Discharge home today. Follow up with Dr. Clifton James  or office APP 2-3 weeks Ready for DC   Vesta Mixer, Montez Hageman., MD, [facility] 04/26/2015, 5:38 PM [address]  [address] Office [phone] Pager [phone]

## 2015-04-26 NOTE — Discharge Instructions (Signed)
No driving for 24 hours. No lifting over 5 lbs for 1 week. No sexual activity for 1 week. Keep procedure site clean & dry. If you notice increased pain, swelling, bleeding or pus, call/return!  You may shower, but no soaking baths/hot tubs/pools for 1 week.  ° ° °

## 2015-04-26 NOTE — Interval H&P Note (Signed)
History and Physical Interval Note:  04/26/2015 2:31 PM  Justin Galvan  has presented today for cardiac cath with the diagnosis of unstable angina, known CAD. The various methods of treatment have been discussed with the patient and family. After consideration of risks, benefits and other options for treatment, the patient has consented to  Procedure(s): Left Heart Cath and Coronary Angiography (N/A) as a surgical intervention .  The patient's history has been reviewed, patient examined, no change in status, stable for surgery.  I have reviewed the patient's chart and labs.  Questions were answered to the patient's satisfaction.    Cath Lab Visit (complete for each Cath Lab visit)  Clinical Evaluation Leading to the Procedure:   ACS: No.  Non-ACS:    Anginal Classification: CCS III  Anti-ischemic medical therapy: Minimal Therapy (1 class of medications)  Non-Invasive Test Results: No non-invasive testing performed  Prior CABG: No previous CABG         MCALHANY,CHRISTOPHER

## 2015-04-26 NOTE — Progress Notes (Signed)
ANTICOAGULATION CONSULT NOTE - Follow Up Consult  Pharmacy Consult for Heparin  Indication: chest pain/ACS  No Known Allergies  Patient Measurements: Height: 5\' 7"  (170.2 cm) Weight: 178 lb 8 oz (80.967 kg) IBW/kg (Calculated) : 66.1   Vital Signs: Temp: 98.2 F (36.8 C) (01/03 1422) Temp Source: Oral (01/03 1422) BP: 131/72 mmHg (01/03 1422) Pulse Rate: 71 (01/03 1422)  Labs:  Recent Labs  04/24/15 0549  04/25/15 0120 04/25/15 0720 04/26/15 0349 04/26/15 1213  HGB 13.4  --  14.2  --  14.3  --   HCT 40.0  --  40.3  --  41.0  --   PLT 208  --  215  --  230  --   HEPARINUNFRC >2.20*  < > 0.46 0.69 0.74* 0.69  CREATININE  --   --   --   --  0.68  --   < > = values in this interval not displayed.  Estimated Creatinine Clearance: 105.1 mL/min (by C-G formula based on Cr of 0.68).  Assessment: 55 y/o M with significant cardiac history here with CP/HA. Heparin level therapeutic 0.69 on IV heparin drip 1400 units/hr. CBC stable. No bleeding noted. Currently in cath lab for cardiac cath.   Goal of Therapy:  Heparin level 0.3-0.7 units/ml Monitor platelets by anticoagulation protocol: Yes   Plan:  F/u post cath  Noah Delaineuth Duglas Heier, RPh Clinical Pharmacist Pager: [phone] 04/26/2015 3:02 PM

## 2015-04-26 NOTE — Progress Notes (Signed)
ANTICOAGULATION CONSULT NOTE - Follow Up Consult  Pharmacy Consult for Heparin  Indication: chest pain/ACS  No Known Allergies  Patient Measurements: Height: 5\' 7"  (170.2 cm) Weight: 180 lb 11.2 oz (81.965 kg) IBW/kg (Calculated) : 66.1   Vital Signs: Temp: 98.3 F (36.8 C) (01/02 2109) Temp Source: Oral (01/02 2109) BP: 168/89 mmHg (01/02 2109) Pulse Rate: 68 (01/02 2109)  Labs:  Recent Labs  04/23/15 0509  04/23/15 1240 04/24/15 0549  04/25/15 0120 04/25/15 0720 04/26/15 0349  HGB 13.7  --   --  13.4  --  14.2  --  14.3  HCT 39.6  --   --  40.0  --  40.3  --  41.0  PLT 190  --   --  208  --  215  --  230  HEPARINUNFRC  --   < > 0.38 >2.20*  < > 0.46 0.69 0.74*  CREATININE 0.76  --   --   --   --   --   --   --   TROPONINI <0.03  --  <0.03  --   --   --   --   --   < > = values in this interval not displayed.  Estimated Creatinine Clearance: 105.7 mL/min (by C-G formula based on Cr of 0.76).  Assessment: 55 y/o M with significant cardiac history here with CP/HA. Heparin level supratherapeutic (0.74) on 1550 units/hr. CBC stable. No bleeding noted. Plans noted for cath today.  Goal of Therapy:  Heparin level 0.3-0.7 units/ml Monitor platelets by anticoagulation protocol: Yes   Plan:  Decrease Heparin to 1400 units/hr F/u post cath  Christoper Fabianaron Helga Asbury, PharmD, BCPS Clinical pharmacist, pager [phone] 04/26/2015 4:45 AM

## 2015-04-27 ENCOUNTER — Encounter (HOSPITAL_COMMUNITY): Payer: Self-pay | Admitting: Cardiovascular Disease

## 2015-04-27 MED FILL — Nitroglycerin IV Soln 100 MCG/ML in D5W: INTRA_ARTERIAL | Qty: 10 | Status: AC

## 2015-05-10 ENCOUNTER — Encounter: Payer: Self-pay | Admitting: Cardiology

## 2015-05-10 ENCOUNTER — Ambulatory Visit (INDEPENDENT_AMBULATORY_CARE_PROVIDER_SITE_OTHER): Payer: 59 | Admitting: Cardiology

## 2015-05-10 VITALS — BP 150/70 | HR 78 | Ht 67.0 in | Wt 178.8 lb

## 2015-05-10 DIAGNOSIS — I10 Essential (primary) hypertension: Secondary | ICD-10-CM | POA: Diagnosis not present

## 2015-05-10 DIAGNOSIS — I25119 Atherosclerotic heart disease of native coronary artery with unspecified angina pectoris: Secondary | ICD-10-CM | POA: Diagnosis not present

## 2015-05-10 MED ORDER — NITROGLYCERIN 0.4 MG SL SUBL: 0.4000 mg | SUBLINGUAL_TABLET | SUBLINGUAL | Status: DC | PRN

## 2015-05-10 MED ORDER — LISINOPRIL 40 MG PO TABS: 40.0000 mg | ORAL_TABLET | Freq: Every day | ORAL | Status: DC

## 2015-05-10 MED ORDER — CLOPIDOGREL BISULFATE 75 MG PO TABS: 75.0000 mg | ORAL_TABLET | Freq: Every day | ORAL | Status: DC

## 2015-05-10 NOTE — Patient Instructions (Signed)
Medication Instructions:  Your physician has recommended you make the following change in your medication:  1) INCREASE Lisinopril to  daily. 2) RESUME Plavix  daily. Rx hane been sent to your pharmacy  Labwork: None ordered  Testing/Procedures: None ordered  Follow-Up: Your physician recommends that you schedule a follow-up appointment in: 4-6 weeks with Dr.Mcalhany   Any Other Special Instructions Will Be Listed Below (If Applicable).     If you need a refill on your cardiac medications before your next appointment, please call your pharmacy.

## 2015-05-10 NOTE — Progress Notes (Signed)
05/10/2015 Justin Galvan   09/25/1960  [id number]  Primary Physician Sanda Linger, MD Primary Cardiologist: Dr. Clifton James   Reason for Visit/CC: [facility] F/u for CAD  HPI:  55 year old male with past medical history of HTN, HLD, DM and CAD. He had multiple PCI in the past including stent to his RCA in 1997 and DES 2 to proximal to mid RCA in 2005. He underwent repeat cardiac catheterization in January 2015 which showed Mid LAD 90 (hazy), ost Dx 80, prox RCA stent 50 ISR, mid RCA 100 (L-R collats), inf HK, EF 40-45%. LAD FFR 0.91 => 0.80. PCI: Promus (3 x 12 mm) DES to the mid LAD.  He presents to [facility] on 04/22/2015 with 2 weeks onset of intermittent symptom mainly with exertion. He has left-sided neck and chest pain with headache and bilateral bilateral arm pain with exertion relieved with rest. The symptom was reminiscent of his previous anginal symptoms. He is no longer taking Plavix for at least 6 months despite it is listed as his home medication. He is also not taking any statin medication. He was admitted to cardiology service with plan for cardiac catheterization. Lipid panel was obtained on 12/31 which showed severely uncontrolled cholesterol profile including total cholesterol 249, triglyceride 487, HDL 31, unable to calculate LDL. Hemoglobin A1c was 7.0. Echocardiogram was obtained on 12/31 which showed EF 45%, hypokinesis of basal mid inferior myocardium, grade 1 salt dysfunction. He underwent planned cardiac catheterization on 1/3 which showed double vessel CAD with patent stent in mid LAD with no restenosis, severe disease in very small diagonal branch at the ostium where the vessel was jailed by the LAD stent, the vessel was too small for PCI, chronic occlusion of mid RCA with distal vessel filled from left-to-right collaterals. Medical therapy was recommended. Imdur was added as an antianginal. Fenofibrate was also added for cholesterol. He had no post cath  complications. He was discharged home on 04/26/15.   He presents back to clinic for post hospital f/u. He reports that he has done well. He denies any significant CP. He had one episode of stable angina walking up an incline but it resolved quickly. He denies any exertional chest discomfort or dyspnea walking on flat surfaces. No dizziness, syncope/ near syncope.   BP today is 150/70. HR is 78 bpm. He asked if he can use Viagra for ED.     Current Outpatient Prescriptions  Medication Sig Dispense Refill  . aspirin EC 81 MG tablet Take 81 mg by mouth daily.    Marland Kitchen atorvastatin (LIPITOR) 80 MG tablet Take 1 tablet (80 mg total) by mouth daily at 6 PM. 90 tablet 3  . carvedilol (COREG) 25 MG tablet Take 1 tablet (25 mg total) by mouth 2 (two) times daily. 180 tablet 3  . empagliflozin (JARDIANCE) 10 MG TABS tablet Take 10 mg by mouth daily. 90 tablet 3  . fenofibrate 160 MG tablet Take 1 tablet (160 mg total) by mouth daily. 90 tablet 1  . glucose blood test strip Test up to two times daily 100 each 12  . Icosapent Ethyl (VASCEPA) 1 G CAPS Take 2 capsules by mouth 2 (two) times daily. 360 capsule 3  . insulin detemir (LEVEMIR) 100 unit/ml SOLN Inject 40 Units into the skin at bedtime.    . isosorbide mononitrate (IMDUR) 30 MG 24 hr tablet Take 1 tablet (30 mg total) by mouth daily. 90 tablet 3  . lisinopril (PRINIVIL,ZESTRIL) 20 MG tablet Take 1  tablet (20 mg total) by mouth daily. 90 tablet 3  . nitroGLYCERIN (NITROSTAT) 0.4 MG SL tablet Place 1 tablet (0.4 mg total) under the tongue every 5 (five) minutes as needed for chest pain. 25 tablet 4  . ONETOUCH DELICA LANCETS 33G MISC Test up to two times daily 100 each 11  . PARoxetine (PAXIL) 40 MG tablet TAKE 1 TABLET (40 MG TOTAL) BY MOUTH DAILY. 90 tablet 3  . quinapril-hydrochlorothiazide (ACCURETIC) 20-12.5 MG tablet Take 2 tablets by mouth 2 (two) times daily.    . SitaGLIPtin-MetFORMIN HCl (JANUMET XR) [id number] MG TB24 Take 1 tablet by mouth  daily. 90 tablet 3   No current facility-administered medications for this visit.    No Known Allergies  Social History   Social History  . Marital Status: Married    Spouse Name: N/A  . Number of Children: 2  . Years of Education: N/A   Occupational History  . contractor    Social History Main Topics  . Smoking status: Former Smoker -- 2.75 packs/day for 22 years    Types: Cigarettes    Quit date: 09/07/1996  . Smokeless tobacco: Never Used  . Alcohol Use: Yes     Comment: 04/22/2015 "might drink a 6 pack of beer/year"  . Drug Use: No  . Sexual Activity: Not Currently   Other Topics Concern  . Not on file   Social History Narrative     Review of Systems: General: negative for chills, fever, night sweats or weight changes.  Cardiovascular: negative for chest pain, dyspnea on exertion, edema, orthopnea, palpitations, paroxysmal nocturnal dyspnea or shortness of breath Dermatological: negative for rash Respiratory: negative for cough or wheezing Urologic: negative for hematuria Abdominal: negative for nausea, vomiting, diarrhea, bright red blood per rectum, melena, or hematemesis Neurologic: negative for visual changes, syncope, or dizziness All other systems reviewed and are otherwise negative except as noted above.    Blood pressure 150/70, pulse 78, height  (1.702 m), weight 178 lb 12.8 oz (81.103 kg).  General appearance: alert, cooperative and no distress Neck: no carotid bruit and no JVD Lungs: clear to auscultation bilaterally Heart: regular rate and rhythm, S1, S2 normal, no murmur, click, rub or gallop Extremities: no [name] Pulses: 2+ and symmetric Skin: warm and dry Neurologic: Grossly normal  EKG NSR. HR 78 bpm.   ASSESSMENT AND PLAN:   1.CAD: recent cath outlined above. Medical therapy elected. He has done well with Imdur. Continue ASA, Plavix, statin, BB and nitrate.   2. HTN: His BP is moderately elevated today. He is on the max dose of  Coreg. Renal function is normal. We will increase his lisinopril to 40 mg daily.  3. HLD: on statin therapy with Lipitor. Fenofibrate recently added for additional coverage. He will need repeat FLP in 6 weeks.  4. ED: we discussed interactions of nitrates with ED meds. He understands that the combination can cause an unsafe drop in BP. Patient instructed not to use Viagra, given he is on Imdur. He verbalized understanding.   PLAN  F/u with Dr. Clifton James in 4-6 weeks.   Robbie Lis PA-C 05/10/2015 2:37 PM

## 2015-06-21 NOTE — Progress Notes (Signed)
Chief Complaint  Patient presents with  . Follow-up    ptv c/o burning in neck and head hurting on exertion  . Coronary Artery Disease      History of Present Illness: 55 yo male with history of CAD, DM, HTN, HLD who is here today for cardiac follow up. He has been followed by Dr. Daleen Squibb. He has had prior stenting procedures of the RCA with his first MI in 1997. Cardiac cath in March 2005 with two Taxus stents placed in the proximal and mid RCA. Moderate disease in LAD and Circumflex at that time. I saw him in January 2015 and he had c/o chest pains. Cardiac cath 05/06/13 with severe stenosis mid LAD and chronically occluded, RCA with good left to right collaterals. I placed a 3.0 x 12 mm Promus DES in the mid LAD. Admitted to [facility] with chest pain January 2017. Cardiac cath with stable CAD, patent stent LAD. Lipids were uncontrolled and Lipitor replaced Vytorin. Fenofibrate was added. Imdur was added but he did not start until last week.   He is here today for follow up. He tells me that he is feeling better. No chest pain. He wishes to stop Imdur so he can take Viagra.   Primary Care Physician: Sanda Linger  Past Medical History  Diagnosis Date  . HLD (hyperlipidemia)   . HTN (hypertension)   . BPH (benign prostatic hypertrophy)   . Depression   . Anginal pain (HCC)   . S/P coronary artery stent placement -mid LAD, 05/06/13 Promus Premier DES 05/07/2013  . CAD, NATIVE VESSEL, Hx. stent-RCA 1997; 2 Taxusprox & mid RCA 06/2003, cath 05/06/13 100% RCA, mid LAD disease 05/03/2009    Qualifier: Diagnosis of  By: Denyse Amass CMA, Carol    . MI (myocardial infarction) (HCC) 1997; ~ 2002; 04/2013  . Kidney stones   . DM2 (diabetes mellitus, type 2) (HCC)   . Arthritis     "fingers" (04/22/2015)    Past Surgical History  Procedure Laterality Date  . Left heart catheterization with coronary angiogram N/A 05/06/2013    Procedure: LEFT HEART CATHETERIZATION WITH CORONARY ANGIOGRAM;  Surgeon: Kathleene Hazel, MD;  Location: [facility] CATH LAB;  Service: Cardiovascular;  Laterality: N/A;  . Coronary angioplasty with stent placement  1997 - 04/2013    "got several stents; may 8 or 9"  . Coronary angioplasty with stent placement  05/06/2013    DES/LAD               DR Clifton James  . Cardiac catheterization N/A 04/26/2015    Procedure: Left Heart Cath and Coronary Angiography;  Surgeon: Kathleene Hazel, MD;  Location: [facility] INVASIVE CV LAB;  Service: Cardiovascular;  Laterality: N/A;    Current Outpatient Prescriptions  Medication Sig Dispense Refill  . aspirin EC 81 MG tablet Take 81 mg by mouth daily.    Marland Kitchen atorvastatin (LIPITOR) 80 MG tablet Take 1 tablet (80 mg total) by mouth daily at 6 PM. 90 tablet 3  . carvedilol (COREG) 25 MG tablet Take 1 tablet (25 mg total) by mouth 2 (two) times daily. 180 tablet 3  . clopidogrel (PLAVIX) 75 MG tablet Take 1 tablet (75 mg total) by mouth daily. 30 tablet 5  . fenofibrate 160 MG tablet Take 1 tablet (160 mg total) by mouth daily. 90 tablet 1  . glucose blood test strip Test up to two times daily 100 each 12  . Icosapent Ethyl (VASCEPA) 1 G CAPS Take 2 capsules  by mouth 2 (two) times daily. 360 capsule 3  . insulin detemir (LEVEMIR) 100 unit/ml SOLN Inject 40 Units into the skin at bedtime.    . isosorbide mononitrate (IMDUR) 30 MG 24 hr tablet Take 1 tablet (30 mg total) by mouth daily. 90 tablet 3  . lisinopril (PRINIVIL,ZESTRIL) 40 MG tablet Take 1 tablet (40 mg total) by mouth daily. 30 tablet 5  . nitroGLYCERIN (NITROSTAT) 0.4 MG SL tablet Place 1 tablet (0.4 mg total) under the tongue every 5 (five) minutes as needed for chest pain. 25 tablet 3  . ONETOUCH DELICA LANCETS 33G MISC Test up to two times daily 100 each 11  . PARoxetine (PAXIL) 40 MG tablet TAKE 1 TABLET (40 MG TOTAL) BY MOUTH DAILY. 90 tablet 3  . quinapril-hydrochlorothiazide (ACCURETIC) 20-12.5 MG tablet Take 2 tablets by mouth 2 (two) times daily.     No current  facility-administered medications for this visit.    No Known Allergies  Social History   Social History  . Marital Status: Married    Spouse Name: N/A  . Number of Children: 2  . Years of Education: N/A   Occupational History  . contractor    Social History Main Topics  . Smoking status: Former Smoker -- 2.75 packs/day for 22 years    Types: Cigarettes    Quit date: 09/07/1996  . Smokeless tobacco: Never Used  . Alcohol Use: Yes     Comment: 04/22/2015 "might drink a 6 pack of beer/year"  . Drug Use: No  . Sexual Activity: Not Currently   Other Topics Concern  . Not on file   Social History Narrative    Family History  Problem Relation Age of Onset  . Hypertension    . Cancer Neg Hx   . Diabetes Neg Hx   . Early death Neg Hx   . Hyperlipidemia Neg Hx   . Kidney disease Neg Hx   . Stroke Neg Hx   . Alcohol abuse Neg Hx   . Heart disease Mother     Review of Systems:  As stated in the HPI and otherwise negative.   BP 170/90 mmHg  Pulse 74  Ht  (1.702 m)  Wt 182 lb (82.555 kg)  BMI 28.50 kg/m2  SpO2 95%  Physical Examination: General: Well developed, well nourished, NAD HEENT: OP clear, mucus membranes moist SKIN: warm, dry. No rashes. Neuro: No focal deficits Musculoskeletal: Muscle strength 5/5 all ext Psychiatric: Mood and affect normal Neck: No JVD, no carotid bruits, no thyromegaly, no lymphadenopathy. Lungs:Clear bilaterally, no wheezes, rhonci, crackles Cardiovascular: Regular rate and rhythm. No murmurs, gallops or rubs. Abdomen:Soft. Bowel sounds present. Non-tender.  Extremities: No lower extremity edema. Pulses are 2 + in the bilateral DP/PT.  Echo 02/14/06: Overall left ventricular systolic function was at the lower limits of normal. Left ventricular ejection fraction was estimated , range being 40 % to 45 %. There was hypokinesis of the inferoposterior wall. - The left atrium was mildly dilated.  Cardiac cath 04/26/15:  1.  Double vessel CAD.  2. Patent stent mid LAD with no restenosis.  3. Severe disease very small diagonal branch at the ostium where the vessel is jailed by the LAD stent. Too small for PCI.  4. Chronic occlusion mid RCA. The distal vessel fills from left to right collaterals.  5. Mild segmental LV systolic dysfunction.   EKG:  EKG is ordered today. The ekg ordered today demonstrates NSR, rate 74 PVC. Old inferior infarct.  Recent Labs: 08/04/2014: ALT 20; TSH 2.34 04/26/2015: BUN 10; Creatinine, Ser 0.68; Hemoglobin 14.3; Platelets 230; Potassium 3.6; Sodium 140   Lipid Panel    Component Value Date/Time   CHOL 249* 04/23/2015 0509   TRIG 487* 04/23/2015 0509   HDL 31* 04/23/2015 0509   CHOLHDL 8.0 04/23/2015 0509   VLDL UNABLE TO CALCULATE IF TRIGLYCERIDE OVER 400 mg/dL 16/01/9603 5409   LDLCALC UNABLE TO CALCULATE IF TRIGLYCERIDE OVER 400 mg/dL 81/19/1478 2956   LDLDIRECT 95.0 01/06/2014 0838     Wt Readings from Last 3 Encounters:  06/22/15 182 lb (82.555 kg)  05/10/15 178 lb 12.8 oz (81.103 kg)  04/26/15 178 lb 8 oz (80.967 kg)     Other studies Reviewed: Additional studies/ records that were reviewed today include: . Review of the above records demonstrates:    Assessment and Plan:   1. CAD: He has had no recent chest pain or dyspnea to suggest angina. CAD stable by cath January 2017. Continue ASA, Plavix, statin, beta blocker, Imdur.  He may choose to stop Imdur so he can use Viagra.   2. HTN: BP controlled at home. No changes.   3. HLD: He is on Lipitor and fenofibrate.  REcheck lipids and LFTs in 8 weeks.   4. Ischemic cardiomyopathy: LVEF=45-50% by LV gram January 2017. Continue beta blocker and Ace inh.  Current medicines are reviewed at length with the patient today.  The patient does not have concerns regarding medicines.  The following changes have been made:  no change  Labs/ tests ordered today include:   Orders Placed This Encounter  Procedures  .  Lipid Profile  . Hepatic function panel  . EKG 12-Lead     Disposition:   FU with me in 6 months   Signed, Verne Carrow, MD 06/22/2015 12:16 PM    [facility] [address] [place], [place], [place]  [address] Phone: [phone]; Fax: [phone]    .

## 2015-06-22 ENCOUNTER — Encounter: Payer: Self-pay | Admitting: Cardiovascular Disease

## 2015-06-22 ENCOUNTER — Ambulatory Visit (INDEPENDENT_AMBULATORY_CARE_PROVIDER_SITE_OTHER): Payer: Managed Care, Other (non HMO) | Admitting: Cardiovascular Disease

## 2015-06-22 ENCOUNTER — Encounter (INDEPENDENT_AMBULATORY_CARE_PROVIDER_SITE_OTHER): Payer: Self-pay

## 2015-06-22 VITALS — BP 170/90 | HR 74 | Ht 67.0 in | Wt 182.0 lb

## 2015-06-22 DIAGNOSIS — I25119 Atherosclerotic heart disease of native coronary artery with unspecified angina pectoris: Secondary | ICD-10-CM

## 2015-06-22 DIAGNOSIS — I255 Ischemic cardiomyopathy: Secondary | ICD-10-CM

## 2015-06-22 DIAGNOSIS — E785 Hyperlipidemia, unspecified: Secondary | ICD-10-CM | POA: Diagnosis not present

## 2015-06-22 DIAGNOSIS — I10 Essential (primary) hypertension: Secondary | ICD-10-CM | POA: Diagnosis not present

## 2015-06-22 NOTE — Patient Instructions (Signed)
Medication Instructions:  Your physician recommends that you continue on your current medications as directed. Please refer to the Current Medication list given to you today.   Labwork: Your physician recommends that you return for fasting lab work in: about 8 weeks. --Scheduled for April 26,2017.  The lab opens at 7:30 AM (lipid and liver profiles_   Testing/Procedures: none  Follow-Up: Your physician wants you to follow-up in: 6 months.  You will receive a reminder letter in the mail two months in advance. If you don't receive a letter, please call our office to schedule the follow-up appointment.   Any Other Special Instructions Will Be Listed Below (If Applicable).     If you need a refill on your cardiac medications before your next appointment, please call your pharmacy.

## 2015-07-02 ENCOUNTER — Other Ambulatory Visit: Payer: Self-pay | Admitting: Physician Assistant

## 2015-07-04 NOTE — Telephone Encounter (Signed)
Review for refill, Thank you. 

## 2015-07-11 ENCOUNTER — Telehealth: Payer: Self-pay | Admitting: Cardiovascular Disease

## 2015-07-11 NOTE — Telephone Encounter (Signed)
Will send to Dr. Clifton JamesMcAlhany for clarification if pt was to stop Imdur

## 2015-07-11 NOTE — Telephone Encounter (Signed)
I placed call to pt to see if he was taking Imdur. Left message to call back. I returned call to Mia at [facility] and told her pt should not be taking both. She reports pt's wife told them pt was not taking Imdur but pharmacist reports he picked up prescription for Imdur last week.  I told her I have placed call to pt to clarify.  Pharmacist will not fill Viagra until she hears back from our office.

## 2015-07-11 NOTE — Telephone Encounter (Signed)
If using Viagra, he should stop Imdur. cdm

## 2015-07-11 NOTE — Telephone Encounter (Signed)
New Message:    She wants to know if pt is still on Imdur? Pt is taking Viagra from another doctor. There is a interaction between Imdur and Viagra. If pt is still on Imdur,iis it all right for him to take with Viagra?

## 2015-07-13 ENCOUNTER — Telehealth: Payer: Self-pay | Admitting: Cardiovascular Disease

## 2015-07-13 NOTE — Telephone Encounter (Signed)
Lmtcb, Per Dr Weldon PickingMcAlhaney's last OV note pt is to stop Imdur if he is to start Viagra. Please confirm pt stopped/ pharmacy stated in earlier note that he picked up Imdur.

## 2015-07-13 NOTE — Telephone Encounter (Signed)
New message ° ° °Patient returning call back to nurse.  °

## 2015-07-13 NOTE — Telephone Encounter (Signed)
Left pt a message to call back. [facility] pharmacist is aware that we will call them back once we talk with pt.

## 2015-07-13 NOTE — Telephone Encounter (Signed)
Follow up      Calling to see if it is ok to fill presc for viagra.  Please call.  Pt is wanting his viagra filled.

## 2015-07-15 NOTE — Telephone Encounter (Signed)
See phone note dated 07/11/15 which addresses this

## 2015-07-15 NOTE — Telephone Encounter (Signed)
Follow up  ° ° ° °Returning call back to nurse  °

## 2015-07-15 NOTE — Telephone Encounter (Signed)
Spoke with pt. He is aware he cannot take Imdur and Viagra at the same time.  He reports he is not taking Imdur.  Will remove Imdur from med list. I spoke with [facility] and told them pt is not taking Imdur.

## 2015-07-18 ENCOUNTER — Telehealth: Payer: Self-pay | Admitting: Cardiovascular Disease

## 2015-07-18 NOTE — Telephone Encounter (Signed)
Spoke with Mia at [facility] and told her we did not have name of [facility].  They will contact pt to get this information.

## 2015-07-18 NOTE — Telephone Encounter (Signed)
°*  STAT* If patient is at the pharmacy, call can be transferred to refill team.   1. Which medications need to be refilled? (please list name of each medication and dose if known) Viagra   2. Which pharmacy/location (including street and city if local pharmacy) is medication to be sent to?[facility]  In [phone])  3. Do they need a 30 day or 90 day supply? 30

## 2015-07-18 NOTE — Telephone Encounter (Signed)
Spoke with [facility] and they will follow up on this. Viagra was prescribed by another provider.

## 2015-07-18 NOTE — Telephone Encounter (Signed)
New Message:   She needs to know which [facility]  and who is his doctor there?

## 2015-07-20 ENCOUNTER — Other Ambulatory Visit: Payer: Self-pay | Admitting: Internal Medicine

## 2015-08-01 ENCOUNTER — Other Ambulatory Visit: Payer: Self-pay | Admitting: Cardiovascular Disease

## 2015-08-03 ENCOUNTER — Other Ambulatory Visit: Payer: Self-pay | Admitting: Internal Medicine

## 2015-08-17 ENCOUNTER — Other Ambulatory Visit (INDEPENDENT_AMBULATORY_CARE_PROVIDER_SITE_OTHER): Payer: Managed Care, Other (non HMO) | Admitting: *Deleted

## 2015-08-17 DIAGNOSIS — E785 Hyperlipidemia, unspecified: Secondary | ICD-10-CM | POA: Diagnosis not present

## 2015-08-17 LAB — HEPATIC FUNCTION PANEL
ALBUMIN: 4.3 g/dL (ref 3.6–5.1)
ALT: 26 U/L (ref 9–46)
AST: 20 U/L (ref 10–35)
Alkaline Phosphatase: 45 U/L (ref 40–115)
BILIRUBIN TOTAL: 0.4 mg/dL (ref 0.2–1.2)
Bilirubin, Direct: 0.1 mg/dL (ref <=0.2)
Indirect Bilirubin: 0.3 mg/dL (ref 0.2–1.2)
TOTAL PROTEIN: 6.9 g/dL (ref 6.1–8.1)

## 2015-08-17 LAB — LIPID PANEL
CHOL/HDL RATIO: 5.7 ratio — AB (ref <=5.0)
Cholesterol: 198 mg/dL (ref 125–200)
HDL: 35 mg/dL — AB (ref >=40)
LDL Cholesterol: 123 mg/dL (ref <130)
Triglycerides: 201 mg/dL — ABNORMAL HIGH (ref <150)
VLDL: 40 mg/dL — ABNORMAL HIGH (ref <30)

## 2015-08-17 NOTE — Addendum Note (Signed)
Addended by: BOWDEN, ROBIN K on: 08/17/2015 07:35 AM   Modules accepted: Orders  

## 2015-08-18 ENCOUNTER — Other Ambulatory Visit: Payer: Self-pay | Admitting: Internal Medicine

## 2015-08-19 NOTE — Telephone Encounter (Signed)
Pt overdue for yearly Can we get on schedule must see for further fills has been informed twice on fills

## 2015-08-19 NOTE — Telephone Encounter (Signed)
Call patient and informed him. He has a CPE on May 16. THank you.

## 2015-08-26 ENCOUNTER — Telehealth: Payer: Self-pay | Admitting: Cardiovascular Disease

## 2015-08-26 NOTE — Telephone Encounter (Signed)
Spoke with pt and reviewed lab results with him. Appt made with lipid clinic for May 9,2017 at 9:30

## 2015-08-26 NOTE — Telephone Encounter (Signed)
F/u  Pt returning rn phone call- lab results. Please call back and discuss.

## 2015-08-30 ENCOUNTER — Ambulatory Visit (INDEPENDENT_AMBULATORY_CARE_PROVIDER_SITE_OTHER): Payer: Managed Care, Other (non HMO) | Admitting: Pharmacist

## 2015-08-30 VITALS — BP 158/98

## 2015-08-30 DIAGNOSIS — E781 Pure hyperglyceridemia: Secondary | ICD-10-CM | POA: Diagnosis not present

## 2015-08-30 DIAGNOSIS — I2510 Atherosclerotic heart disease of native coronary artery without angina pectoris: Secondary | ICD-10-CM

## 2015-08-30 DIAGNOSIS — I10 Essential (primary) hypertension: Secondary | ICD-10-CM

## 2015-08-30 MED ORDER — EZETIMIBE 10 MG PO TABS: 10.0000 mg | ORAL_TABLET | Freq: Every day | ORAL | Status: DC

## 2015-08-30 MED ORDER — AMLODIPINE BESYLATE 5 MG PO TABS: 5.0000 mg | ORAL_TABLET | Freq: Every day | ORAL | Status: DC

## 2015-08-30 NOTE — Progress Notes (Signed)
Patient ID: Justin Galvan                 DOB: [birth year]                    MRN: [id number]     HPI: Justin Galvan is a 55 y.o. male patient referred to lipid clinic by Dr. Clifton Galvan. PMH significant for CAD/MI (stent-RCA 1997, stent x2 mid RCA 2005, stent mid LAD 2015), HTN, HLD, T2DM, and BPH. Presents to establish care in lipid clinic. Poor historian in regard to current medications; denies taking his medications yesterday evening and this morning. Has also been taking only one capsule of fish oil daily and on duplicate ACEi therapy (quinapril/HCTZ and lisinopril). Denies adverse effects or issues obtaining medications. States he is aware of his high cholesterol and attributes this to dietary indiscretion. Does not check BP at home but reports he does have a cuff.   Current Medications: Atorvastatin  (recent change from Vytorin 04/2015), Vascepa - prescribed 4g daily but only taking 1g daily, fenofibrate  daily  Intolerances: None  Risk Factors: CAD, MI, HTN, DM, HLD  LDL goal: <70   Diet: Eats out nearly 7 times a week for breakfast and lunch; combination of fast food and eat-in restaurants. Diet high in sodium, fat, and carbs. Usually eats anything he likes. Reports he is eating fewer potatoes.  Exercise: Active at work. No regular exercise regimen otherwise.   Family History: Heart disease in mother  Social History: Chiropractor. 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 Denies smokeless tobacco, alcohol, or illicit drugs  Labs:  4/26 Lipids: TC 198, TG 201, HDL 35, LDL 123 (on atorvasatin , fenofibrate , Vascepa 1g daily)  Past Medical History  Diagnosis Date  . HLD (hyperlipidemia)   . HTN (hypertension)   . BPH (benign prostatic hypertrophy)   . Depression   . Anginal pain (HCC)   . S/P coronary artery stent placement -mid LAD, 05/06/13 Promus Premier DES 05/07/2013  . CAD, NATIVE VESSEL, Hx. stent-RCA 1997; 2 Taxusprox & mid RCA 06/2003, cath 05/06/13 100%  RCA, mid LAD disease 05/03/2009    Qualifier: Diagnosis of  By: Justin [name] Galvan, Justin Galvan    . MI (myocardial infarction) (HCC) 1997; ~ 2002; 04/2013  . Kidney stones   . DM2 (diabetes mellitus, type 2) (HCC)   . Arthritis     "fingers" (04/22/2015)    Current Outpatient Prescriptions on File Prior to Visit  Medication Sig Dispense Refill  . aspirin EC 81 MG tablet Take 81 mg by mouth daily.    Marland Kitchen atorvastatin (LIPITOR) 80 MG tablet TAKE 1 TABLET (80 MG TOTAL) BY MOUTH DAILY AT 6 PM. 30 tablet 3  . carvedilol (COREG) 25 MG tablet TAKE 1 TABLET (25 MG TOTAL) BY MOUTH 2 (TWO) TIMES DAILY. 60 tablet 3  . clopidogrel (PLAVIX) 75 MG tablet Take 1 tablet (75 mg total) by mouth daily. 30 tablet 5  . fenofibrate 160 MG tablet TAKE 1 TABLET (160 MG TOTAL) BY MOUTH DAILY. 30 tablet 3  . glucose blood test strip Test up to two times daily 100 each 12  . Icosapent Ethyl (VASCEPA) 1 G CAPS Take 2 capsules by mouth 2 (two) times daily. 360 capsule 3  . insulin detemir (LEVEMIR) 100 unit/ml SOLN Inject 40 Units into the skin at bedtime.    Marland Kitchen JANUMET XR [id number] MG TB24 TAKE 1 TABLET BY MOUTH DAILY. 30 tablet 0  . nitroGLYCERIN (NITROSTAT) 0.4 MG SL  tablet Place 1 tablet (0.4 mg total) under the tongue every 5 (five) minutes as needed for chest pain. 25 tablet 3  . ONETOUCH DELICA LANCETS 33G MISC Test up to two times daily 100 each 11  . PARoxetine (PAXIL) 40 MG tablet TAKE 1 TABLET (40 MG TOTAL) BY MOUTH DAILY. 90 tablet 3  . quinapril-hydrochlorothiazide (ACCURETIC) 20-12.5 MG tablet Take 2 tablets by mouth 2 (two) times daily.     No current facility-administered medications on file prior to visit.    No Known Allergies  Assessment/Plan:  1.) Lipids:  LDL currently 123mg /dL above goal of <16<70 mg/dL on current regimen of atorvastatin 80mg , fenofibrate 160mg , and Vascepa 1 capsule daily; largely due to dietary indiscretion. Instructed patient to take 4 capsules of Vascepa daily. Will also restart ezetimibe  10mg  daily (was on previously in combination with simvastatin). Spent an extensive amount of time providing diet counseling to reduce risk/lipids. Pt agrees to eat healthier options when eating out at least 3-4 days/wk. Will check lipid panel in 3 months.  2.) Hypertension:  BP above goal of <140/90 mmHg on current regimen. Again this is largely due to dietary indiscretion - discussed cutting back on salt intake. Will discontinue lisinopril duplicate ACEi therapy and continue quinapril/HCTZ 20/12.5mg  BID. Will initiate amlodipine 5mg  daily. Instructed pt to check BP at home and bring logs and cuff to next visit. F/U in 4 weeks.  Justin Galvan, Justin Galvan Clinical Pharmacy Resident 10:45 AM, 08/30/2015

## 2015-08-30 NOTE — Patient Instructions (Addendum)
It was nice to see you in clinic today.  Try to cut back on your fast food - go for the healthier option at least 4 times a week. Increase your fish oil to 4 capsules every day, this will help to lower your triglycerides. You can take 4 capsules all at once or 2 capsules twice a day. Start taking Zetia 10mg  (ezetimibe) to start taking once a day to help lower your LDL cholesterol - sent to your mail order pharmacy. Continue taking your Lipitor and fenofibrate.  For your blood pressure, pick up your prescription to start taking amlodipine 5mg  once a day - this was sent to [facility]. Stop taking your lisinopril. Check your blood pressure at home and record your readings. We will follow up with you in clinic again in 3 weeks to check your blood pressure again. Please bring your blood pressure cuff with you.

## 2015-09-06 ENCOUNTER — Encounter: Payer: Self-pay | Admitting: Internal Medicine

## 2015-09-06 ENCOUNTER — Other Ambulatory Visit (INDEPENDENT_AMBULATORY_CARE_PROVIDER_SITE_OTHER): Payer: Managed Care, Other (non HMO)

## 2015-09-06 ENCOUNTER — Ambulatory Visit (INDEPENDENT_AMBULATORY_CARE_PROVIDER_SITE_OTHER): Payer: Managed Care, Other (non HMO) | Admitting: Internal Medicine

## 2015-09-06 VITALS — BP 136/86 | HR 90 | Temp 98.5°F | Resp 16 | Ht 67.0 in | Wt 182.0 lb

## 2015-09-06 DIAGNOSIS — E781 Pure hyperglyceridemia: Secondary | ICD-10-CM

## 2015-09-06 DIAGNOSIS — E1151 Type 2 diabetes mellitus with diabetic peripheral angiopathy without gangrene: Secondary | ICD-10-CM

## 2015-09-06 DIAGNOSIS — E785 Hyperlipidemia, unspecified: Secondary | ICD-10-CM | POA: Diagnosis not present

## 2015-09-06 DIAGNOSIS — I10 Essential (primary) hypertension: Secondary | ICD-10-CM | POA: Diagnosis not present

## 2015-09-06 DIAGNOSIS — Z0000 Encounter for general adult medical examination without abnormal findings: Secondary | ICD-10-CM

## 2015-09-06 DIAGNOSIS — E1159 Type 2 diabetes mellitus with other circulatory complications: Secondary | ICD-10-CM

## 2015-09-06 DIAGNOSIS — I70209 Unspecified atherosclerosis of native arteries of extremities, unspecified extremity: Secondary | ICD-10-CM | POA: Diagnosis not present

## 2015-09-06 DIAGNOSIS — I2510 Atherosclerotic heart disease of native coronary artery without angina pectoris: Secondary | ICD-10-CM

## 2015-09-06 DIAGNOSIS — E1169 Type 2 diabetes mellitus with other specified complication: Secondary | ICD-10-CM | POA: Insufficient documentation

## 2015-09-06 DIAGNOSIS — F418 Other specified anxiety disorders: Secondary | ICD-10-CM

## 2015-09-06 LAB — CBC WITH DIFFERENTIAL/PLATELET
BASOS PCT: 1 % (ref 0.0–3.0)
Basophils Absolute: 0.1 10*3/uL (ref 0.0–0.1)
EOS PCT: 2.4 % (ref 0.0–5.0)
Eosinophils Absolute: 0.2 10*3/uL (ref 0.0–0.7)
HEMATOCRIT: 39.3 % (ref 39.0–52.0)
HEMOGLOBIN: 13.5 g/dL (ref 13.0–17.0)
LYMPHS PCT: 30.5 % (ref 12.0–46.0)
Lymphs Abs: 1.9 10*3/uL (ref 0.7–4.0)
MCHC: 34.2 g/dL (ref 30.0–36.0)
MCV: 86 fl (ref 78.0–100.0)
Monocytes Absolute: 0.6 10*3/uL (ref 0.1–1.0)
Monocytes Relative: 9.7 % (ref 3.0–12.0)
Neutro Abs: 3.6 10*3/uL (ref 1.4–7.7)
Neutrophils Relative %: 56.4 % (ref 43.0–77.0)
Platelets: 289 10*3/uL (ref 150.0–400.0)
RBC: 4.57 Mil/uL (ref 4.22–5.81)
RDW: 13 % (ref 11.5–15.5)
WBC: 6.3 10*3/uL (ref 4.0–10.5)

## 2015-09-06 LAB — COMPREHENSIVE METABOLIC PANEL
ALT: 21 U/L (ref 0–53)
AST: 18 U/L (ref 0–37)
Albumin: 4.5 g/dL (ref 3.5–5.2)
Alkaline Phosphatase: 48 U/L (ref 39–117)
BUN: 16 mg/dL (ref 6–23)
CHLORIDE: 103 meq/L (ref 96–112)
CO2: 28 meq/L (ref 19–32)
CREATININE: 0.95 mg/dL (ref 0.40–1.50)
Calcium: 9.9 mg/dL (ref 8.4–10.5)
GFR: 86.77 mL/min (ref >60.00)
GLUCOSE: 216 mg/dL — AB (ref 70–99)
Potassium: 4.2 mEq/L (ref 3.5–5.1)
SODIUM: 140 meq/L (ref 135–145)
Total Bilirubin: 0.3 mg/dL (ref 0.2–1.2)
Total Protein: 6.7 g/dL (ref 6.0–8.3)

## 2015-09-06 LAB — MICROALBUMIN / CREATININE URINE RATIO
Creatinine,U: 113.7 mg/dL
MICROALB/CREAT RATIO: 0.7 mg/g (ref 0.0–30.0)
Microalb, Ur: 0.8 mg/dL (ref 0.0–1.9)

## 2015-09-06 LAB — URINALYSIS, ROUTINE W REFLEX MICROSCOPIC
BILIRUBIN URINE: NEGATIVE
Hgb urine dipstick: NEGATIVE
KETONES UR: NEGATIVE
LEUKOCYTES UA: NEGATIVE
NITRITE: NEGATIVE
PH: 6 (ref 5.0–8.0)
SPECIFIC GRAVITY, URINE: 1.025 (ref 1.000–1.030)
Total Protein, Urine: NEGATIVE
URINE GLUCOSE: 500 — AB
Urobilinogen, UA: 0.2 (ref 0.0–1.0)

## 2015-09-06 LAB — PSA: PSA: 1.14 ng/mL (ref 0.10–4.00)

## 2015-09-06 LAB — HEMOGLOBIN A1C: HEMOGLOBIN A1C: 9.3 % — AB (ref 4.6–6.5)

## 2015-09-06 LAB — LDL CHOLESTEROL, DIRECT: LDL DIRECT: 98 mg/dL

## 2015-09-06 LAB — TSH: TSH: 2.15 u[IU]/mL (ref 0.35–4.50)

## 2015-09-06 LAB — LIPID PANEL
CHOL/HDL RATIO: 5
Cholesterol: 172 mg/dL (ref 0–200)
HDL: 31.6 mg/dL — ABNORMAL LOW (ref >39.00)
NonHDL: 140.65
Triglycerides: 267 mg/dL — ABNORMAL HIGH (ref 0.0–149.0)
VLDL: 53.4 mg/dL — AB (ref 0.0–40.0)

## 2015-09-06 LAB — FECAL OCCULT BLOOD, GUAIAC: Fecal Occult Blood: NEGATIVE

## 2015-09-06 MED ORDER — SITAGLIP PHOS-METFORMIN HCL ER 100-1000 MG PO TB24: 1.0000 | ORAL_TABLET | Freq: Every day | ORAL | Status: DC

## 2015-09-06 MED ORDER — PAROXETINE HCL 40 MG PO TABS: ORAL_TABLET | ORAL | Status: DC

## 2015-09-06 MED ORDER — INSULIN DETEMIR 100 UNIT/ML FLEXPEN: 80.0000 [IU] | Freq: Every day | SUBCUTANEOUS | Status: DC

## 2015-09-06 MED ORDER — ICOSAPENT ETHYL 1 G PO CAPS: 2.0000 | ORAL_CAPSULE | Freq: Two times a day (BID) | ORAL | Status: DC

## 2015-09-06 NOTE — Patient Instructions (Signed)

## 2015-09-06 NOTE — Progress Notes (Signed)
Pre visit review using our clinic review tool, if applicable. No additional management support is needed unless otherwise documented below in the visit note. 

## 2015-09-06 NOTE — Progress Notes (Signed)
Subjective:  Patient ID: Justin Galvan, male    DOB: 07-01-1960  Age: 55 y.o. MRN: [id number]  CC: Hypertension; Hyperlipidemia; Annual Exam; and Diabetes   HPI Justin Galvan presents for a CPX.  He doesn't know if his blood sugars are well-controlled because he doesn't check. He has had some episodes polydipsia but denies a any visual changes, polyuria, or polyphagia.  He tells me his blood pressures been well controlled on the combination of amlodipine, and ACE inhibitor, and carvedilol.  He is treating his LDL with atorvastatin and Zetia. He reports that he has been compliant but has not made any progress with weight loss. It looks like he has not recently been compliant with Vascepa for his triglycerides.  He has had some cardiac complications over the last year but denies any recent episodes of chest pain or shortness of breath.     Outpatient Prescriptions Prior to Visit  Medication Sig Dispense Refill  . amLODipine (NORVASC) 5 MG tablet Take 1 tablet (5 mg total) by mouth daily. 30 tablet 1  . aspirin EC 81 MG tablet Take 81 mg by mouth daily.    Marland Kitchen atorvastatin (LIPITOR) 80 MG tablet TAKE 1 TABLET (80 MG TOTAL) BY MOUTH DAILY AT 6 PM. 30 tablet 3  . carvedilol (COREG) 25 MG tablet TAKE 1 TABLET (25 MG TOTAL) BY MOUTH 2 (TWO) TIMES DAILY. 60 tablet 3  . clopidogrel (PLAVIX) 75 MG tablet Take 1 tablet (75 mg total) by mouth daily. 30 tablet 5  . ezetimibe (ZETIA) 10 MG tablet Take 1 tablet (10 mg total) by mouth daily. 90 tablet 3  . fenofibrate 160 MG tablet TAKE 1 TABLET (160 MG TOTAL) BY MOUTH DAILY. 30 tablet 3  . glucose blood test strip Test up to two times daily 100 each 12  . nitroGLYCERIN (NITROSTAT) 0.4 MG SL tablet Place 1 tablet (0.4 mg total) under the tongue every 5 (five) minutes as needed for chest pain. 25 tablet 3  . ONETOUCH DELICA LANCETS 33G MISC Test up to two times daily 100 each 11  . Icosapent Ethyl (VASCEPA) 1 G CAPS Take 2 capsules by mouth 2  (two) times daily. 360 capsule 3  . insulin detemir (LEVEMIR) 100 unit/ml SOLN Inject 40 Units into the skin at bedtime.    Marland Kitchen JANUMET XR [phone] MG TB24 TAKE 1 TABLET BY MOUTH DAILY. 30 tablet 0  . PARoxetine (PAXIL) 40 MG tablet TAKE 1 TABLET (40 MG TOTAL) BY MOUTH DAILY. 90 tablet 3  . quinapril-hydrochlorothiazide (ACCURETIC) 20-12.5 MG tablet Take 2 tablets by mouth 2 (two) times daily.     No facility-administered medications prior to visit.    ROS Review of Systems  Constitutional: Negative.  Negative for fever, chills, diaphoresis, appetite change and fatigue.  HENT: Negative.   Eyes: Negative.   Respiratory: Negative.  Negative for cough, choking, chest tightness, shortness of breath and stridor.   Cardiovascular: Negative.  Negative for chest pain, palpitations and leg swelling.  Gastrointestinal: Negative.  Negative for nausea, vomiting, abdominal pain, diarrhea and constipation.  Endocrine: Positive for polydipsia. Negative for cold intolerance, heat intolerance, polyphagia and polyuria.  Genitourinary: Negative.  Negative for dysuria, urgency, decreased urine volume and difficulty urinating.  Musculoskeletal: Negative.  Negative for myalgias, back pain, arthralgias and neck pain.  Skin: Negative.  Negative for color change and rash.  Allergic/Immunologic: Negative.   Neurological: Negative.  Negative for dizziness, tremors, weakness, light-headedness, numbness and headaches.  Hematological: Negative.  Negative  for adenopathy. Does not bruise/bleed easily.  Psychiatric/Behavioral: Negative.  Negative for sleep disturbance, dysphoric mood, decreased concentration and agitation. The patient is not nervous/anxious.     Objective:  BP 136/86 mmHg  Pulse 90  Temp(Src) 98.5 F (36.9 C) (Oral)  Resp 16  Ht 5\' 7"  (1.702 m)  Wt 182 lb (82.555 kg)  BMI 28.50 kg/m2  SpO2 96%  BP Readings from Last 3 Encounters:  09/06/15 136/86  08/30/15 158/98  06/22/15 170/90    Wt  Readings from Last 3 Encounters:  09/06/15 182 lb (82.555 kg)  06/22/15 182 lb (82.555 kg)  05/10/15 178 lb 12.8 oz (81.103 kg)    Physical Exam  Constitutional: He is oriented to person, place, and time. He appears well-developed and well-nourished. No distress.  HENT:  Head: Normocephalic and atraumatic.  Mouth/Throat: Oropharynx is clear and moist. No oropharyngeal exudate.  Eyes: Conjunctivae are normal. Right eye exhibits no discharge. Left eye exhibits no discharge. No scleral icterus.  Neck: Normal range of motion. Neck supple. No JVD present. No tracheal deviation present. No thyromegaly present.  Cardiovascular: Normal rate, regular rhythm, normal heart sounds and intact distal pulses.  Exam reveals no gallop and no friction rub.   No murmur heard. Pulmonary/Chest: Effort normal and breath sounds normal. No stridor. No respiratory distress. He has no wheezes. He has no rales. He exhibits no tenderness.  Abdominal: Soft. Bowel sounds are normal. He exhibits no distension and no mass. There is no tenderness. There is no rebound and no guarding. Hernia confirmed negative in the right inguinal area and confirmed negative in the left inguinal area.  Genitourinary: Rectum normal, testes normal and penis normal. Rectal exam shows no external hemorrhoid, no internal hemorrhoid, no fissure, no mass, no tenderness and anal tone normal. Guaiac negative stool. Prostate is enlarged (1+ smooth symm BPH). Prostate is not tender. Right testis shows no mass, no swelling and no tenderness. Right testis is descended. Left testis shows no mass, no swelling and no tenderness. Left testis is descended. Circumcised. No penile erythema or penile tenderness. No discharge found.  Musculoskeletal: Normal range of motion. He exhibits no edema or tenderness.  Lymphadenopathy:    He has no cervical adenopathy.       Right: No inguinal adenopathy present.       Left: No inguinal adenopathy present.  Neurological:  He is oriented to person, place, and time.  Skin: Skin is warm and dry. No rash noted. He is not diaphoretic. No erythema. No pallor.  Psychiatric: He has a normal mood and affect. His behavior is normal. Judgment and thought content normal.  Vitals reviewed.   Lab Results  Component Value Date   WBC 6.3 09/06/2015   HGB 13.5 09/06/2015   HCT 39.3 09/06/2015   PLT 289.0 09/06/2015   GLUCOSE 216* 09/06/2015   CHOL 172 09/06/2015   TRIG 267.0* 09/06/2015   HDL 31.60* 09/06/2015   LDLDIRECT 98.0 09/06/2015   LDLCALC 123 08/17/2015   ALT 21 09/06/2015   AST 18 09/06/2015   NA 140 09/06/2015   K 4.2 09/06/2015   CL 103 09/06/2015   CREATININE 0.95 09/06/2015   BUN 16 09/06/2015   CO2 28 09/06/2015   TSH 2.15 09/06/2015   PSA 1.14 09/06/2015   INR 1.04 04/23/2015   HGBA1C 9.3* 09/06/2015   MICROALBUR 0.8 09/06/2015    Dg Chest 2 View  04/22/2015  CLINICAL DATA:  Pt brought in EMS for a frontal HA that radiates down  bilateral arms with left sided chest pain. Symptoms have been present intermittently x2 weeks. Hx MI, HTN controlled with medication, diabetes and ex-smoker as of 20 years ago. EXAM: CHEST  2 VIEW COMPARISON:  None. FINDINGS: The heart size and mediastinal contours are within normal limits. Both lungs are clear. The visualized skeletal structures are unremarkable. IMPRESSION: No active cardiopulmonary disease. Electronically Signed   By: Norva Pavlov M.D.   On: 04/22/2015 18:32    Assessment & Plan:   Vernel was seen today for hypertension, hyperlipidemia, annual exam and diabetes.  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 I will treat his risk factors with LDL reduction, treatment of triglycerides, blood sugar and blood pressure control. -     Icosapent Ethyl (VASCEPA) 1 g CAPS; Take 2 capsules by mouth 2 (two)  times daily.  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 Will continue the current oral medications but I've also asked him to double his insulin dose from your 40 units a day to 80 units a day. -     Hemoglobin A1c; Future -     Microalbumin / creatinine urine ratio; Future -     SitaGLIPtin-MetFORMIN HCl (JANUMET XR) [phone] MG TB24; Take 1 tablet by mouth daily. -     Ambulatory referral to Ophthalmology -     insulin detemir (LEVEMIR) 100 unit/ml SOLN; Inject 0.8 mLs (80 Units total) into the skin at bedtime.  Essential hypertension- his blood pressure is well-controlled, electrolytes and renal function are stable.  Hyperlipidemia with target LDL less than 70- he is not quite achieved his LDL goal, I've asked him to be more compliant with lifestyle modifications and will continue the statin and Zetia.  Routine general medical examination at a health care facility- exam completed, labs ordered and reviewed, vaccines were reviewed and updated, his colonoscopy is up-to-date, patient education material was given. -     Lipid panel; Future -     Comprehensive metabolic panel; Future -     CBC with Differential/Platelet; Future -     TSH; Future -     Urinalysis, Routine w reflex microscopic (not at [facility]); Future -     PSA; Future  Depression with anxiety -     PARoxetine (PAXIL) 40 MG tablet; TAKE 1 TABLET (40 MG TOTAL) BY MOUTH DAILY.  Hypertriglyceridemia- history this was a moderately elevated, I've asked him to restart Vascepa and will increase his insulin dose. -     insulin detemir (LEVEMIR) 100 unit/ml SOLN; Inject 0.8 mLs (80 Units total) into the skin at bedtime. -     Icosapent Ethyl (VASCEPA) 1 g CAPS; Take 2 capsules by mouth 2 (two) times daily.   I have discontinued Mr. Rovito's quinapril-hydrochlorothiazide, VIAGRA, and isosorbide mononitrate. I have changed his JANUMET XR to SitaGLIPtin-MetFORMIN HCl. I have also  changed his insulin detemir. Additionally, I am having him maintain his aspirin EC, ONETOUCH DELICA LANCETS 33G, glucose blood, nitroGLYCERIN, clopidogrel, carvedilol, fenofibrate, atorvastatin, amLODipine, ezetimibe, lisinopril, PARoxetine, and Icosapent Ethyl.  Meds ordered this encounter  Medications  . lisinopril (PRINIVIL,ZESTRIL) 40 MG tablet    Sig:   . DISCONTD: VIAGRA 100 MG tablet    Sig: TAKE 1/2 TABLET AS NEEDED AS DIRECTED    Refill:  6  . DISCONTD: isosorbide mononitrate (IMDUR)  30 MG 24 hr tablet    Sig: TAKE 1 TABLET (30 MG TOTAL) BY MOUTH DAILY.    Refill:  0  . SitaGLIPtin-MetFORMIN HCl (JANUMET XR) [phone] MG TB24    Sig: Take 1 tablet by mouth daily.    Dispense:  90 tablet    Refill:  1  . PARoxetine (PAXIL) 40 MG tablet    Sig: TAKE 1 TABLET (40 MG TOTAL) BY MOUTH DAILY.    Dispense:  90 tablet    Refill:  3  . insulin detemir (LEVEMIR) 100 unit/ml SOLN    Sig: Inject 0.8 mLs (80 Units total) into the skin at bedtime.    Dispense:  10 mL    Refill:  3  . Icosapent Ethyl (VASCEPA) 1 g CAPS    Sig: Take 2 capsules by mouth 2 (two) times daily.    Dispense:  360 capsule    Refill:  3     Follow-up: Return in about 6 months (around 03/08/2016).  Sanda Linger, MD

## 2015-09-09 ENCOUNTER — Telehealth: Payer: Self-pay

## 2015-09-09 NOTE — Telephone Encounter (Signed)
PA initiated and APPROVED via fax form by [facility] 08/09/2015 - 09/07/2018.  Forms sent to scan

## 2015-10-03 ENCOUNTER — Ambulatory Visit: Payer: Managed Care, Other (non HMO)

## 2015-10-20 LAB — HM DIABETES EYE EXAM

## 2015-10-21 ENCOUNTER — Encounter: Payer: Self-pay | Admitting: Internal Medicine

## 2015-12-08 ENCOUNTER — Other Ambulatory Visit: Payer: Self-pay | Admitting: Cardiovascular Disease

## 2016-01-29 ENCOUNTER — Other Ambulatory Visit: Payer: Self-pay | Admitting: Cardiovascular Disease

## 2016-03-20 ENCOUNTER — Telehealth: Payer: Self-pay

## 2016-03-20 ENCOUNTER — Other Ambulatory Visit: Payer: Self-pay | Admitting: Internal Medicine

## 2016-03-20 DIAGNOSIS — E1151 Type 2 diabetes mellitus with diabetic peripheral angiopathy without gangrene: Secondary | ICD-10-CM

## 2016-03-20 DIAGNOSIS — I70209 Unspecified atherosclerosis of native arteries of extremities, unspecified extremity: Principal | ICD-10-CM

## 2016-03-20 MED ORDER — SITAGLIP PHOS-METFORMIN HCL ER 100-1000 MG PO TB24: 1.0000 | ORAL_TABLET | Freq: Every day | ORAL | 0 refills | Status: DC

## 2016-03-20 NOTE — Telephone Encounter (Signed)
[facility] rq rf of janumet. LOV was 09/06/2015.   Please advise.

## 2016-03-20 NOTE — Telephone Encounter (Signed)
LVM for pt to call back as soon as possible.   RE: pt needs to come in for an appt.

## 2016-03-20 NOTE — Telephone Encounter (Signed)
rx sent He is due for a f/up appt

## 2016-03-26 NOTE — Telephone Encounter (Signed)
Contacted pt and a follow up appt has been made for this month.

## 2016-04-11 ENCOUNTER — Ambulatory Visit (INDEPENDENT_AMBULATORY_CARE_PROVIDER_SITE_OTHER): Payer: Managed Care, Other (non HMO) | Admitting: Internal Medicine

## 2016-04-11 ENCOUNTER — Encounter: Payer: Self-pay | Admitting: Internal Medicine

## 2016-04-11 ENCOUNTER — Other Ambulatory Visit (INDEPENDENT_AMBULATORY_CARE_PROVIDER_SITE_OTHER): Payer: Managed Care, Other (non HMO)

## 2016-04-11 VITALS — BP 170/90 | HR 81 | Temp 98.0°F | Resp 16 | Ht 67.0 in | Wt 173.0 lb

## 2016-04-11 DIAGNOSIS — E785 Hyperlipidemia, unspecified: Secondary | ICD-10-CM

## 2016-04-11 DIAGNOSIS — I2510 Atherosclerotic heart disease of native coronary artery without angina pectoris: Secondary | ICD-10-CM

## 2016-04-11 DIAGNOSIS — E781 Pure hyperglyceridemia: Secondary | ICD-10-CM

## 2016-04-11 DIAGNOSIS — J0101 Acute recurrent maxillary sinusitis: Secondary | ICD-10-CM

## 2016-04-11 DIAGNOSIS — E1151 Type 2 diabetes mellitus with diabetic peripheral angiopathy without gangrene: Secondary | ICD-10-CM | POA: Diagnosis not present

## 2016-04-11 DIAGNOSIS — I10 Essential (primary) hypertension: Secondary | ICD-10-CM | POA: Diagnosis not present

## 2016-04-11 DIAGNOSIS — I70209 Unspecified atherosclerosis of native arteries of extremities, unspecified extremity: Secondary | ICD-10-CM

## 2016-04-11 LAB — LIPID PANEL
CHOLESTEROL: 129 mg/dL (ref 0–200)
HDL: 44.8 mg/dL (ref >39.00)
LDL CALC: 73 mg/dL (ref 0–99)
NonHDL: 84.5
TRIGLYCERIDES: 56 mg/dL (ref 0.0–149.0)
Total CHOL/HDL Ratio: 3
VLDL: 11.2 mg/dL (ref 0.0–40.0)

## 2016-04-11 LAB — BASIC METABOLIC PANEL
BUN: 19 mg/dL (ref 6–23)
CO2: 29 mEq/L (ref 19–32)
CREATININE: 0.84 mg/dL (ref 0.40–1.50)
Calcium: 9.4 mg/dL (ref 8.4–10.5)
Chloride: 104 mEq/L (ref 96–112)
GFR: 99.8 mL/min (ref >60.00)
GLUCOSE: 158 mg/dL — AB (ref 70–99)
Potassium: 4.3 mEq/L (ref 3.5–5.1)
Sodium: 140 mEq/L (ref 135–145)

## 2016-04-11 LAB — HEMOGLOBIN A1C: Hgb A1c MFr Bld: 9.2 % — ABNORMAL HIGH (ref 4.6–6.5)

## 2016-04-11 MED ORDER — INSULIN DETEMIR 100 UNIT/ML FLEXPEN: 80.0000 [IU] | Freq: Every day | SUBCUTANEOUS | 3 refills | Status: DC

## 2016-04-11 MED ORDER — CARVEDILOL 25 MG PO TABS: ORAL_TABLET | ORAL | 1 refills | Status: DC

## 2016-04-11 MED ORDER — SITAGLIP PHOS-METFORMIN HCL ER 100-1000 MG PO TB24: 1.0000 | ORAL_TABLET | Freq: Every day | ORAL | 1 refills | Status: DC

## 2016-04-11 MED ORDER — SULFAMETHOXAZOLE-TRIMETHOPRIM 800-160 MG PO TABS: 1.0000 | ORAL_TABLET | Freq: Two times a day (BID) | ORAL | 1 refills | Status: AC

## 2016-04-11 MED ORDER — ATORVASTATIN CALCIUM 80 MG PO TABS: ORAL_TABLET | ORAL | 3 refills | Status: DC

## 2016-04-11 MED ORDER — LISINOPRIL 40 MG PO TABS: 40.0000 mg | ORAL_TABLET | Freq: Every day | ORAL | 1 refills | Status: DC

## 2016-04-11 MED ORDER — CLOPIDOGREL BISULFATE 75 MG PO TABS: 75.0000 mg | ORAL_TABLET | Freq: Every day | ORAL | 1 refills | Status: DC

## 2016-04-11 NOTE — Progress Notes (Signed)
Pre visit review using our clinic review tool, if applicable. No additional management support is needed unless otherwise documented below in the visit note. 

## 2016-04-11 NOTE — Progress Notes (Signed)
Subjective:  Patient ID: Justin Galvan, male    DOB: 05/24/1960  Age: 55 y.o. MRN: [id number]  CC: Hypertension; Hyperlipidemia; Coronary Artery Disease; and Diabetes   HPI Justin SchilderMichael C Oltmann presents for follow-up on multiple medical problems.  He complains of a sinus infection over the last few weeks with copious amounts of green nasal phlegm with some nasal pain and sinus pain. He denies bloody nose, cough, hemoptysis, sore throat, fever, chills.  He has not been compliant with his antihypertensives and tells me his blood pressure has not been well controlled and he has had a few headaches. He denies blurred vision, chest pain, shortness of breath, palpitations, dyspnea on exertion, edema, or fatigue.  He is also been noncompliant with his diabetic medications and complains of polys.  Outpatient Medications Prior to Visit  Medication Sig Dispense Refill  . amLODipine (NORVASC) 5 MG tablet TAKE 1 TABLET (5 MG TOTAL) BY MOUTH DAILY. 90 tablet 2  . aspirin EC 81 MG tablet Take 81 mg by mouth daily.    Marland Kitchen. ezetimibe (ZETIA) 10 MG tablet Take 1 tablet (10 mg total) by mouth daily. 90 tablet 3  . fenofibrate 160 MG tablet TAKE 1 TABLET (160 MG TOTAL) BY MOUTH DAILY. 30 tablet 4  . Icosapent Ethyl (VASCEPA) 1 g CAPS Take 2 capsules by mouth 2 (two) times daily. 360 capsule 3  . PARoxetine (PAXIL) 40 MG tablet TAKE 1 TABLET (40 MG TOTAL) BY MOUTH DAILY. 90 tablet 3  . insulin detemir (LEVEMIR) 100 unit/ml SOLN Inject 0.8 mLs (80 Units total) into the skin at bedtime. 10 mL 3  . SitaGLIPtin-MetFORMIN HCl (JANUMET XR) [phone] MG TB24 Take 1 tablet by mouth daily. 90 tablet 0  . glucose blood test strip Test up to two times daily (Patient not taking: Reported on 04/11/2016) 100 each 12  . nitroGLYCERIN (NITROSTAT) 0.4 MG SL tablet Place 1 tablet (0.4 mg total) under the tongue every 5 (five) minutes as needed for chest pain. (Patient not taking: Reported on 04/11/2016) 25 tablet 3  . atorvastatin  (LIPITOR) 80 MG tablet TAKE 1 TABLET (80 MG TOTAL) BY MOUTH DAILY AT 6 PM. (Patient not taking: Reported on 04/11/2016) 30 tablet 4  . carvedilol (COREG) 25 MG tablet TAKE 1 TABLET (25 MG TOTAL) BY MOUTH 2 (TWO) TIMES DAILY. (Patient not taking: Reported on 04/11/2016) 60 tablet 3  . clopidogrel (PLAVIX) 75 MG tablet Take 1 tablet (75 mg total) by mouth daily. (Patient not taking: Reported on 04/11/2016) 30 tablet 5  . lisinopril (PRINIVIL,ZESTRIL) 40 MG tablet     . ONETOUCH DELICA LANCETS 33G MISC Test up to two times daily (Patient not taking: Reported on 04/11/2016) 100 each 11   No facility-administered medications prior to visit.     ROS Review of Systems  Constitutional: Negative for activity change, appetite change, chills, fatigue, fever and unexpected weight change.  HENT: Positive for postnasal drip, rhinorrhea, sinus pain and sinus pressure. Negative for congestion, facial swelling, nosebleeds, sore throat, tinnitus and trouble swallowing.   Eyes: Negative for photophobia and visual disturbance.  Respiratory: Negative for apnea, cough, chest tightness and shortness of breath.   Cardiovascular: Negative.  Negative for chest pain, palpitations and leg swelling.  Gastrointestinal: Negative for abdominal pain, constipation, diarrhea, nausea and vomiting.  Endocrine: Positive for polydipsia and polyuria. Negative for cold intolerance, heat intolerance and polyphagia.  Genitourinary: Negative.  Negative for decreased urine volume, difficulty urinating, dysuria, flank pain, frequency and hematuria.  Musculoskeletal:  Negative for arthralgias, back pain, myalgias and neck pain.  Skin: Negative.  Negative for color change, pallor and rash.  Allergic/Immunologic: Negative.   Neurological: Positive for headaches. Negative for dizziness, facial asymmetry, speech difficulty, weakness, light-headedness and numbness.  Hematological: Negative.  Negative for adenopathy. Does not bruise/bleed easily.   Psychiatric/Behavioral: Negative.     Objective:  BP (!) 170/90 (BP Location: Left Arm, Patient Position: Sitting, Cuff Size: Normal)   Pulse 81   Temp 98 F (36.7 C) (Oral)   Resp 16   Ht 5\' 7"  (1.702 m)   Wt 173 lb (78.5 kg)   SpO2 97%   BMI 27.10 kg/m   BP Readings from Last 3 Encounters:  04/11/16 (!) 170/90  09/06/15 136/86  08/30/15 (!) 158/98    Wt Readings from Last 3 Encounters:  04/11/16 173 lb (78.5 kg)  09/06/15 182 lb (82.6 kg)  06/22/15 182 lb (82.6 kg)    Physical Exam  Constitutional: He is oriented to person, place, and time. He appears well-developed and well-nourished. No distress.  HENT:  Head: Normocephalic and atraumatic.  Right Ear: Hearing, tympanic membrane, external ear and ear canal normal.  Left Ear: Hearing, tympanic membrane, external ear and ear canal normal.  Nose: Rhinorrhea present. No mucosal edema or sinus tenderness. No epistaxis. Right sinus exhibits maxillary sinus tenderness. Right sinus exhibits no frontal sinus tenderness. Left sinus exhibits maxillary sinus tenderness. Left sinus exhibits no frontal sinus tenderness.  Mouth/Throat: Mucous membranes are normal. Mucous membranes are not pale and not cyanotic. No trismus in the jaw. No uvula swelling. No oropharyngeal exudate, posterior oropharyngeal edema or tonsillar abscesses.  Eyes: Conjunctivae are normal. Right eye exhibits no discharge. Left eye exhibits no discharge. No scleral icterus.  Neck: Normal range of motion. Neck supple. No JVD present. No tracheal deviation present. No thyromegaly present.  Cardiovascular: Normal rate, regular rhythm, normal heart sounds and intact distal pulses.  Exam reveals no gallop and no friction rub.   No murmur heard. Pulmonary/Chest: Effort normal and breath sounds normal. No stridor. No respiratory distress. He has no wheezes. He has no rales. He exhibits no tenderness.  Abdominal: Soft. Bowel sounds are normal. He exhibits no distension and  no mass. There is no tenderness. There is no rebound and no guarding.  Musculoskeletal: Normal range of motion. He exhibits no edema, tenderness or deformity.  Lymphadenopathy:    He has no cervical adenopathy.  Neurological: He is oriented to person, place, and time.  Skin: Skin is warm and dry. No rash noted. He is not diaphoretic. No erythema. No pallor.  Psychiatric: He has a normal mood and affect. His behavior is normal. Judgment and thought content normal.  Vitals reviewed.   Lab Results  Component Value Date   WBC 6.3 09/06/2015   HGB 13.5 09/06/2015   HCT 39.3 09/06/2015   PLT 289.0 09/06/2015   GLUCOSE 158 (H) 04/11/2016   CHOL 129 04/11/2016   TRIG 56.0 04/11/2016   HDL 44.80 04/11/2016   LDLDIRECT 98.0 09/06/2015   LDLCALC 73 04/11/2016   ALT 21 09/06/2015   AST 18 09/06/2015   NA 140 04/11/2016   K 4.3 04/11/2016   CL 104 04/11/2016   CREATININE 0.84 04/11/2016   BUN 19 04/11/2016   CO2 29 04/11/2016   TSH 2.15 09/06/2015   PSA 1.14 09/06/2015   INR 1.04 04/23/2015   HGBA1C 9.2 (H) 04/11/2016   MICROALBUR 0.8 09/06/2015    Dg Chest 2 View  Result  Date: 04/22/2015 CLINICAL DATA:  Pt brought in EMS for a frontal HA that radiates down bilateral arms with left sided chest pain. Symptoms have been present intermittently x2 weeks. Hx MI, HTN controlled with medication, diabetes and ex-smoker as of 20 years ago. EXAM: CHEST  2 VIEW COMPARISON:  None. FINDINGS: The heart size and mediastinal contours are within normal limits. Both lungs are clear. The visualized skeletal structures are unremarkable. IMPRESSION: No active cardiopulmonary disease. Electronically Signed   By: Norva Pavlov M.D.   On: 04/22/2015 18:32    Assessment & Plan:   Marlen was seen today for hypertension, hyperlipidemia, coronary artery disease and diabetes.  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 I've asked him to restart Janumet and his basal insulin. -     Basic metabolic panel; Future -     Hemoglobin A1c; Future -     lisinopril (PRINIVIL,ZESTRIL) 40 MG tablet; Take 1 tablet (40 mg total) by mouth daily. -     SitaGLIPtin-MetFORMIN HCl (JANUMET XR) [phone] MG TB24; Take 1 tablet by mouth daily. -     Discontinue: insulin detemir (LEVEMIR) 100 unit/ml SOLN; Inject 0.8 mLs (80 Units total) into the skin at bedtime. -     insulin detemir (LEVEMIR) 100 unit/ml SOLN; Inject 0.8 mLs (80 Units total) into the skin at bedtime.  Atherosclerosis of native coronary artery of native heart without angina pectoris- he has had no recent episodes of angina or angina equivalent symptoms. I have encouraged him to be more diligent about his risk factor modifications and maintaining control of his blood pressure and his blood sugars. Will continue clopidogrel. -     Lipid panel; Future -     atorvastatin (LIPITOR) 80 MG tablet; TAKE 1 TABLET (80 MG TOTAL) BY MOUTH DAILY AT 6 PM. -     carvedilol (COREG) 25 MG tablet; TAKE 1 TABLET (25 MG TOTAL) BY MOUTH 2 (TWO) TIMES DAILY. -     clopidogrel (PLAVIX) 75 MG tablet; Take 1 tablet (75 mg total) by mouth daily.  Essential hypertension- his blood pressure is not adequately well controlled. Will restart the carvedilol and continue the ACE inhibitor and the calcium channel blocker. -     Basic metabolic panel; Future -     carvedilol (COREG) 25 MG tablet; TAKE 1 TABLET (25 MG TOTAL) BY MOUTH 2 (TWO) TIMES DAILY.  Hyperlipidemia with target LDL less than 70- he has achieved his LDL goal is doing well on the statin. -     Lipid panel; Future -     atorvastatin (LIPITOR) 80 MG tablet; TAKE 1 TABLET (80 MG TOTAL) BY MOUTH DAILY AT 6 PM.  Hypertriglyceridemia- his triglycerides are well-controlled with fenofibrate and vascepa -     Lipid panel; Future -     Discontinue: insulin detemir (LEVEMIR) 100 unit/ml SOLN; Inject 0.8 mLs (80  Units total) into the skin at bedtime. -     insulin detemir (LEVEMIR) 100 unit/ml SOLN; Inject 0.8 mLs (80 Units total) into the skin at bedtime.  Acute recurrent maxillary sinusitis- I will treat the sinus infection with Bactrim DS. -     sulfamethoxazole-trimethoprim (BACTRIM DS,SEPTRA DS) 800-160 MG tablet; Take 1 tablet by mouth 2 (two) times daily.   I have discontinued Mr. Weisbecker's ONETOUCH DELICA LANCETS 33G. I have also changed his lisinopril. Additionally, I am having him start on sulfamethoxazole-trimethoprim.  Lastly, I am having him maintain his aspirin EC, glucose blood, nitroGLYCERIN, ezetimibe, PARoxetine, Icosapent Ethyl, amLODipine, fenofibrate, atorvastatin, carvedilol, SitaGLIPtin-MetFORMIN HCl, clopidogrel, and insulin detemir.  Meds ordered this encounter  Medications  . atorvastatin (LIPITOR) 80 MG tablet    Sig: TAKE 1 TABLET (80 MG TOTAL) BY MOUTH DAILY AT 6 PM.    Dispense:  90 tablet    Refill:  3  . carvedilol (COREG) 25 MG tablet    Sig: TAKE 1 TABLET (25 MG TOTAL) BY MOUTH 2 (TWO) TIMES DAILY.    Dispense:  180 tablet    Refill:  1  . lisinopril (PRINIVIL,ZESTRIL) 40 MG tablet    Sig: Take 1 tablet (40 mg total) by mouth daily.    Dispense:  90 tablet    Refill:  1  . SitaGLIPtin-MetFORMIN HCl (JANUMET XR) [phone] MG TB24    Sig: Take 1 tablet by mouth daily.    Dispense:  90 tablet    Refill:  1  . clopidogrel (PLAVIX) 75 MG tablet    Sig: Take 1 tablet (75 mg total) by mouth daily.    Dispense:  90 tablet    Refill:  1  . DISCONTD: insulin detemir (LEVEMIR) 100 unit/ml SOLN    Sig: Inject 0.8 mLs (80 Units total) into the skin at bedtime.    Dispense:  10 mL    Refill:  3  . sulfamethoxazole-trimethoprim (BACTRIM DS,SEPTRA DS) 800-160 MG tablet    Sig: Take 1 tablet by mouth 2 (two) times daily.    Dispense:  20 tablet    Refill:  1  . insulin detemir (LEVEMIR) 100 unit/ml SOLN    Sig: Inject 0.8 mLs (80 Units total) into the skin at bedtime.     Dispense:  10 mL    Refill:  3     Follow-up: Return in about 4 weeks (around 05/09/2016).  Sanda Linger, MD

## 2016-04-11 NOTE — Patient Instructions (Signed)
Hypertension Hypertension, commonly called high blood pressure, is when the force of blood pumping through your arteries is too strong. Your arteries are the blood vessels that carry blood from your heart throughout your body. A blood pressure reading consists of a higher number over a lower number, such as 110/72. The higher number (systolic) is the pressure inside your arteries when your heart pumps. The lower number (diastolic) is the pressure inside your arteries when your heart relaxes. Ideally you want your blood pressure below 120/80. Hypertension forces your heart to work harder to pump blood. Your arteries may become narrow or stiff. Having untreated or uncontrolled hypertension can cause heart attack, stroke, kidney disease, and other problems. What increases the risk? Some risk factors for high blood pressure are controllable. Others are not. Risk factors you cannot control include:  Race. You may be at higher risk if you are African American.  Age. Risk increases with age.  Gender. Men are at higher risk than women before age 45 years. After age 65, women are at higher risk than men. Risk factors you can control include:  Not getting enough exercise or physical activity.  Being overweight.  Getting too much fat, sugar, calories, or salt in your diet.  Drinking too much alcohol. What are the signs or symptoms? Hypertension does not usually cause signs or symptoms. Extremely high blood pressure (hypertensive crisis) may cause headache, anxiety, shortness of breath, and nosebleed. How is this diagnosed? To check if you have hypertension, your health care provider will measure your blood pressure while you are seated, with your arm held at the level of your heart. It should be measured at least twice using the same arm. Certain conditions can cause a difference in blood pressure between your right and left arms. A blood pressure reading that is higher than normal on one occasion does  not mean that you need treatment. If it is not clear whether you have high blood pressure, you may be asked to return on a different day to have your blood pressure checked again. Or, you may be asked to monitor your blood pressure at home for 1 or more weeks. How is this treated? Treating high blood pressure includes making lifestyle changes and possibly taking medicine. Living a healthy lifestyle can help lower high blood pressure. You may need to change some of your habits. Lifestyle changes may include:  Following the DASH diet. This diet is high in fruits, vegetables, and whole grains. It is low in salt, red meat, and added sugars.  Keep your sodium intake below 2,300 mg per day.  Getting at least 30-45 minutes of aerobic exercise at least 4 times per week.  Losing weight if necessary.  Not smoking.  Limiting alcoholic beverages.  Learning ways to reduce stress. Your health care provider may prescribe medicine if lifestyle changes are not enough to get your blood pressure under control, and if one of the following is true:  You are 18-59 years of age and your systolic blood pressure is above 140.  You are 60 years of age or older, and your systolic blood pressure is above 150.  Your diastolic blood pressure is above 90.  You have diabetes, and your systolic blood pressure is over 140 or your diastolic blood pressure is over 90.  You have kidney disease and your blood pressure is above 140/90.  You have heart disease and your blood pressure is above 140/90. Your personal target blood pressure may vary depending on your medical   conditions, your age, and other factors. Follow these instructions at home:  Have your blood pressure rechecked as directed by your health care provider.  Take medicines only as directed by your health care provider. Follow the directions carefully. Blood pressure medicines must be taken as prescribed. The medicine does not work as well when you skip  doses. Skipping doses also puts you at risk for problems.  Do not smoke.  Monitor your blood pressure at home as directed by your health care provider. Contact a health care provider if:  You think you are having a reaction to medicines taken.  You have recurrent headaches or feel dizzy.  You have swelling in your ankles.  You have trouble with your vision. Get help right away if:  You develop a severe headache or confusion.  You have unusual weakness, numbness, or feel faint.  You have severe chest or abdominal pain.  You vomit repeatedly.  You have trouble breathing. This information is not intended to replace advice given to you by your health care provider. Make sure you discuss any questions you have with your health care provider. Document Released: 04/09/2005 Document Revised: 09/15/2015 Document Reviewed: 01/30/2013 [employer] Interactive Patient Education  2017 [employer]  

## 2016-08-06 ENCOUNTER — Other Ambulatory Visit: Payer: Self-pay | Admitting: Cardiovascular Disease

## 2016-09-07 ENCOUNTER — Other Ambulatory Visit: Payer: Self-pay | Admitting: Internal Medicine

## 2016-09-07 DIAGNOSIS — F418 Other specified anxiety disorders: Secondary | ICD-10-CM

## 2016-09-10 ENCOUNTER — Other Ambulatory Visit: Payer: Self-pay | Admitting: Cardiovascular Disease

## 2016-09-20 ENCOUNTER — Other Ambulatory Visit: Payer: Self-pay | Admitting: Internal Medicine

## 2016-09-20 DIAGNOSIS — I10 Essential (primary) hypertension: Secondary | ICD-10-CM

## 2016-09-20 DIAGNOSIS — I2510 Atherosclerotic heart disease of native coronary artery without angina pectoris: Secondary | ICD-10-CM

## 2016-09-20 DIAGNOSIS — I70209 Unspecified atherosclerosis of native arteries of extremities, unspecified extremity: Secondary | ICD-10-CM

## 2016-09-20 DIAGNOSIS — E1151 Type 2 diabetes mellitus with diabetic peripheral angiopathy without gangrene: Secondary | ICD-10-CM

## 2016-10-01 ENCOUNTER — Other Ambulatory Visit: Payer: Self-pay | Admitting: Cardiovascular Disease

## 2016-10-10 ENCOUNTER — Other Ambulatory Visit (INDEPENDENT_AMBULATORY_CARE_PROVIDER_SITE_OTHER): Payer: 59

## 2016-10-10 ENCOUNTER — Ambulatory Visit (INDEPENDENT_AMBULATORY_CARE_PROVIDER_SITE_OTHER): Payer: 59 | Admitting: Internal Medicine

## 2016-10-10 VITALS — BP 138/80 | HR 68 | Temp 97.7°F | Ht 67.0 in | Wt 170.2 lb

## 2016-10-10 DIAGNOSIS — I70209 Unspecified atherosclerosis of native arteries of extremities, unspecified extremity: Secondary | ICD-10-CM

## 2016-10-10 DIAGNOSIS — I10 Essential (primary) hypertension: Secondary | ICD-10-CM | POA: Diagnosis not present

## 2016-10-10 DIAGNOSIS — I2510 Atherosclerotic heart disease of native coronary artery without angina pectoris: Secondary | ICD-10-CM | POA: Diagnosis not present

## 2016-10-10 DIAGNOSIS — E1151 Type 2 diabetes mellitus with diabetic peripheral angiopathy without gangrene: Secondary | ICD-10-CM

## 2016-10-10 DIAGNOSIS — J320 Chronic maxillary sinusitis: Secondary | ICD-10-CM | POA: Diagnosis not present

## 2016-10-10 DIAGNOSIS — Z0000 Encounter for general adult medical examination without abnormal findings: Secondary | ICD-10-CM

## 2016-10-10 DIAGNOSIS — N5201 Erectile dysfunction due to arterial insufficiency: Secondary | ICD-10-CM | POA: Insufficient documentation

## 2016-10-10 DIAGNOSIS — R7989 Other specified abnormal findings of blood chemistry: Secondary | ICD-10-CM

## 2016-10-10 DIAGNOSIS — E781 Pure hyperglyceridemia: Secondary | ICD-10-CM

## 2016-10-10 HISTORY — DX: Erectile dysfunction due to arterial insufficiency: N52.01

## 2016-10-10 LAB — CBC WITH DIFFERENTIAL/PLATELET
BASOS ABS: 0.1 10*3/uL (ref 0.0–0.1)
Basophils Relative: 1 % (ref 0.0–3.0)
EOS ABS: 0.2 10*3/uL (ref 0.0–0.7)
Eosinophils Relative: 2.5 % (ref 0.0–5.0)
HCT: 39.1 % (ref 39.0–52.0)
Hemoglobin: 13.5 g/dL (ref 13.0–17.0)
LYMPHS ABS: 2.2 10*3/uL (ref 0.7–4.0)
Lymphocytes Relative: 28.5 % (ref 12.0–46.0)
MCHC: 34.4 g/dL (ref 30.0–36.0)
MCV: 85.7 fl (ref 78.0–100.0)
MONOS PCT: 8.5 % (ref 3.0–12.0)
Monocytes Absolute: 0.7 10*3/uL (ref 0.1–1.0)
NEUTROS ABS: 4.6 10*3/uL (ref 1.4–7.7)
NEUTROS PCT: 59.5 % (ref 43.0–77.0)
PLATELETS: 293 10*3/uL (ref 150.0–400.0)
RBC: 4.56 Mil/uL (ref 4.22–5.81)
RDW: 12.9 % (ref 11.5–15.5)
WBC: 7.7 10*3/uL (ref 4.0–10.5)

## 2016-10-10 LAB — COMPREHENSIVE METABOLIC PANEL
ALK PHOS: 53 U/L (ref 39–117)
ALT: 17 U/L (ref 0–53)
AST: 15 U/L (ref 0–37)
Albumin: 4.4 g/dL (ref 3.5–5.2)
BILIRUBIN TOTAL: 0.4 mg/dL (ref 0.2–1.2)
BUN: 16 mg/dL (ref 6–23)
CALCIUM: 9.9 mg/dL (ref 8.4–10.5)
CO2: 31 mEq/L (ref 19–32)
Chloride: 102 mEq/L (ref 96–112)
Creatinine, Ser: 1.07 mg/dL (ref 0.40–1.50)
GFR: 75.35 mL/min (ref >60.00)
Glucose, Bld: 222 mg/dL — ABNORMAL HIGH (ref 70–99)
Potassium: 3.9 mEq/L (ref 3.5–5.1)
Sodium: 140 mEq/L (ref 135–145)
TOTAL PROTEIN: 6.6 g/dL (ref 6.0–8.3)

## 2016-10-10 LAB — URINALYSIS, ROUTINE W REFLEX MICROSCOPIC
Bilirubin Urine: NEGATIVE
HGB URINE DIPSTICK: NEGATIVE
Ketones, ur: NEGATIVE
Leukocytes, UA: NEGATIVE
NITRITE: NEGATIVE
PH: 6 (ref 5.0–8.0)
Specific Gravity, Urine: >=1.03 — AB (ref 1.000–1.030)
TOTAL PROTEIN, URINE-UPE24: NEGATIVE
Urine Glucose: 250 — AB
Urobilinogen, UA: 0.2 (ref 0.0–1.0)

## 2016-10-10 LAB — LIPID PANEL
CHOLESTEROL: 147 mg/dL (ref 0–200)
HDL: 33.5 mg/dL — ABNORMAL LOW (ref >39.00)
NonHDL: 113.48
TRIGLYCERIDES: 257 mg/dL — AB (ref 0.0–149.0)
Total CHOL/HDL Ratio: 4
VLDL: 51.4 mg/dL — ABNORMAL HIGH (ref 0.0–40.0)

## 2016-10-10 LAB — MICROALBUMIN / CREATININE URINE RATIO
CREATININE, U: 200.8 mg/dL
MICROALB UR: 1.3 mg/dL (ref 0.0–1.9)
Microalb Creat Ratio: 0.6 mg/g (ref 0.0–30.0)

## 2016-10-10 LAB — TSH: TSH: 1.54 u[IU]/mL (ref 0.35–4.50)

## 2016-10-10 LAB — HEMOGLOBIN A1C: Hgb A1c MFr Bld: 8 % — ABNORMAL HIGH (ref 4.6–6.5)

## 2016-10-10 LAB — LDL CHOLESTEROL, DIRECT: LDL DIRECT: 84 mg/dL

## 2016-10-10 LAB — PSA: PSA: 1.41 ng/mL (ref 0.10–4.00)

## 2016-10-10 MED ORDER — DAPAGLIFLOZIN PROPANEDIOL 5 MG PO TABS: 5.0000 mg | ORAL_TABLET | Freq: Every day | ORAL | 1 refills | Status: DC

## 2016-10-10 MED ORDER — TADALAFIL 20 MG PO TABS: 20.0000 mg | ORAL_TABLET | Freq: Every day | ORAL | 11 refills | Status: DC | PRN

## 2016-10-10 MED ORDER — SITAGLIP PHOS-METFORMIN HCL ER 100-1000 MG PO TB24: 1.0000 | ORAL_TABLET | Freq: Every day | ORAL | 1 refills | Status: DC

## 2016-10-10 MED ORDER — AMOXICILLIN-POT CLAVULANATE 875-125 MG PO TABS: 1.0000 | ORAL_TABLET | Freq: Two times a day (BID) | ORAL | 0 refills | Status: AC

## 2016-10-10 MED ORDER — INSULIN DETEMIR 100 UNIT/ML FLEXPEN: 40.0000 [IU] | Freq: Every day | SUBCUTANEOUS | 3 refills | Status: DC

## 2016-10-10 NOTE — Patient Instructions (Signed)

## 2016-10-10 NOTE — Progress Notes (Signed)
Subjective:  Patient ID: Justin Galvan, male    DOB: 09/14/1960  Age: 56 y.o. MRN: [id number]  CC: Hyperlipidemia; Diabetes; and Annual Exam   HPI Justin SchilderMichael C Risden presents for a CPX and f/up - Since I last saw him he had one episode of hypoglycemia so he decided to stop using the basal insulin. He doesn't think his blood sugars are very well controlled but he doesn't check his blood sugars very frequently either. He denies any recent episodes of polys.  He complains of recurrent sinus infections. He has had several infections over the last year. The most recent one started several weeks ago and he complains of thick, bloody, yellow green nasal phlegm with left sinus pain. He also complains of diffuse nasal pain.  Outpatient Medications Prior to Visit  Medication Sig Dispense Refill  . lisinopril (PRINIVIL,ZESTRIL) 40 MG tablet TAKE 1 TABLET DAILY 90 tablet 0  . aspirin EC 81 MG tablet Take 81 mg by mouth daily.    Marland Kitchen. atorvastatin (LIPITOR) 80 MG tablet TAKE 1 TABLET (80 MG TOTAL) BY MOUTH DAILY AT 6 PM. 90 tablet 3  . carvedilol (COREG) 25 MG tablet TAKE 1 TABLET TWICE A DAY 180 tablet 0  . clopidogrel (PLAVIX) 75 MG tablet TAKE 1 TABLET DAILY 90 tablet 0  . ezetimibe (ZETIA) 10 MG tablet TAKE 1 TABLET DAILY 30 tablet 0  . fenofibrate 160 MG tablet TAKE 1 TABLET (160 MG TOTAL) BY MOUTH DAILY. 15 tablet 0  . glucose blood test strip Test up to two times daily (Patient not taking: Reported on 04/11/2016) 100 each 12  . PARoxetine (PAXIL) 40 MG tablet TAKE 1 TABLET BY MOUTH DAILY 90 tablet 2  . amLODipine (NORVASC) 5 MG tablet TAKE 1 TABLET (5 MG TOTAL) BY MOUTH DAILY. 90 tablet 2  . Icosapent Ethyl (VASCEPA) 1 g CAPS Take 2 capsules by mouth 2 (two) times daily. (Patient not taking: Reported on 10/10/2016) 360 capsule 3  . insulin detemir (LEVEMIR) 100 unit/ml SOLN Inject 0.8 mLs (80 Units total) into the skin at bedtime. (Patient not taking: Reported on 10/10/2016) 10 mL 3  . nitroGLYCERIN  (NITROSTAT) 0.4 MG SL tablet Place 1 tablet (0.4 mg total) under the tongue every 5 (five) minutes as needed for chest pain. (Patient not taking: Reported on 04/11/2016) 25 tablet 3  . SitaGLIPtin-MetFORMIN HCl (JANUMET XR) [phone] MG TB24 Take 1 tablet by mouth daily. 90 tablet 1   No facility-administered medications prior to visit.     ROS Review of Systems  Constitutional: Negative.  Negative for appetite change, chills, fatigue, fever and unexpected weight change.  HENT: Positive for congestion, nosebleeds, postnasal drip, rhinorrhea, sinus pain and sinus pressure. Negative for ear pain, facial swelling, mouth sores, sneezing, sore throat, tinnitus, trouble swallowing and voice change.   Eyes: Negative.   Respiratory: Negative.  Negative for cough, chest tightness and shortness of breath.   Cardiovascular: Negative.  Negative for chest pain, palpitations and leg swelling.  Gastrointestinal: Negative.  Negative for abdominal pain, constipation, diarrhea, nausea and vomiting.  Endocrine: Negative.  Negative for polydipsia, polyphagia and polyuria.  Genitourinary: Negative.  Negative for difficulty urinating, frequency, hematuria, scrotal swelling, testicular pain and urgency.       +ED  Musculoskeletal: Negative for back pain, myalgias and neck pain.  Skin: Negative.  Negative for color change and rash.  Allergic/Immunologic: Negative.   Neurological: Negative.  Negative for dizziness, numbness and headaches.  Hematological: Negative for adenopathy. Does not  bruise/bleed easily.  Psychiatric/Behavioral: Negative.     Objective:  BP 138/80 (BP Location: Left Arm, Patient Position: Sitting, Cuff Size: Normal)   Pulse 68   Temp 97.7 F (36.5 C) (Oral)   Ht 5\' 7"  (1.702 m)   Wt 170 lb 4 oz (77.2 kg)   SpO2 98%   BMI 26.66 kg/m   BP Readings from Last 3 Encounters:  10/10/16 138/80  04/11/16 (!) 170/90  09/06/15 136/86    Wt Readings from Last 3 Encounters:  10/10/16 170 lb  4 oz (77.2 kg)  04/11/16 173 lb (78.5 kg)  09/06/15 182 lb (82.6 kg)    Physical Exam  Constitutional: He is oriented to person, place, and time. No distress.  HENT:  Right Ear: Hearing, tympanic membrane, external ear and ear canal normal.  Left Ear: Hearing, tympanic membrane, external ear and ear canal normal.  Nose: No mucosal edema, rhinorrhea or sinus tenderness. No epistaxis.  No foreign bodies. Right sinus exhibits no maxillary sinus tenderness and no frontal sinus tenderness. Left sinus exhibits maxillary sinus tenderness. Left sinus exhibits no frontal sinus tenderness.  Mouth/Throat: Oropharynx is clear and moist and mucous membranes are normal. Mucous membranes are not pale, not dry and not cyanotic. No oral lesions. No trismus in the jaw. No uvula swelling. No oropharyngeal exudate, posterior oropharyngeal edema, posterior oropharyngeal erythema or tonsillar abscesses.  Eyes: Conjunctivae are normal. Right eye exhibits no discharge. Left eye exhibits no discharge. No scleral icterus.  Neck: Normal range of motion. Neck supple. No JVD present. No thyromegaly present.  Cardiovascular: Normal rate, regular rhythm and intact distal pulses.  Exam reveals no gallop and no friction rub.   No murmur heard. Pulmonary/Chest: Breath sounds normal. No respiratory distress. He has no wheezes. He has no rales. He exhibits no tenderness.  Abdominal: Soft. Bowel sounds are normal. He exhibits no distension and no mass. There is no tenderness. There is no rebound and no guarding. Hernia confirmed negative in the right inguinal area and confirmed negative in the left inguinal area.  Genitourinary: Rectum normal, testes normal and penis normal. Rectal exam shows no external hemorrhoid, no internal hemorrhoid, no fissure, no mass, no tenderness, anal tone normal and guaiac negative stool. Prostate is not enlarged and not tender. Right testis shows no mass, no swelling and no tenderness. Right testis is  descended. Left testis shows no mass, no swelling and no tenderness. Left testis is descended. Circumcised. No penile erythema or penile tenderness. No discharge found.  Musculoskeletal: Normal range of motion. He exhibits no edema, tenderness or deformity.  Lymphadenopathy:    He has no cervical adenopathy.       Right: No inguinal adenopathy present.       Left: No inguinal adenopathy present.  Neurological: He is alert and oriented to person, place, and time.  Skin: Skin is warm and dry. No rash noted. He is not diaphoretic. No erythema. No pallor.  Psychiatric: He has a normal mood and affect. His behavior is normal. Judgment and thought content normal.  Vitals reviewed.   Lab Results  Component Value Date   WBC 7.7 10/10/2016   HGB 13.5 10/10/2016   HCT 39.1 10/10/2016   PLT 293.0 10/10/2016   GLUCOSE 222 (H) 10/10/2016   CHOL 147 10/10/2016   TRIG 257.0 (H) 10/10/2016   HDL 33.50 (L) 10/10/2016   LDLDIRECT 84.0 10/10/2016   LDLCALC 73 04/11/2016   ALT 17 10/10/2016   AST 15 10/10/2016   NA 140  10/10/2016   K 3.9 10/10/2016   CL 102 10/10/2016   CREATININE 1.07 10/10/2016   BUN 16 10/10/2016   CO2 31 10/10/2016   TSH 1.54 10/10/2016   PSA 1.41 10/10/2016   INR 1.04 04/23/2015   HGBA1C 8.0 (H) 10/10/2016   MICROALBUR 1.3 10/10/2016    Dg Chest 2 View  Result Date: 04/22/2015 CLINICAL DATA:  Pt brought in EMS for a frontal HA that radiates down bilateral arms with left sided chest pain. Symptoms have been present intermittently x2 weeks. Hx MI, HTN controlled with medication, diabetes and ex-smoker as of 20 years ago. EXAM: CHEST  2 VIEW COMPARISON:  None. FINDINGS: The heart size and mediastinal contours are within normal limits. Both lungs are clear. The visualized skeletal structures are unremarkable. IMPRESSION: No active cardiopulmonary disease. Electronically Signed   By: Norva Pavlov M.D.   On: 04/22/2015 18:32    Assessment & Plan:   Asad was seen  today for hyperlipidemia, diabetes and annual exam.  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 Will continue metformin and the DPP4 inhibitor but will also add on an SGLT2 inhibitor. -     Hemoglobin A1c; Future -     Microalbumin / creatinine urine ratio; Future -     Discontinue: insulin detemir (LEVEMIR) 100 unit/ml SOLN; Inject 0.4 mLs (40 Units total) into the skin at bedtime. -     SitaGLIPtin-MetFORMIN HCl (JANUMET XR) [phone] MG TB24; Take 1 tablet by mouth daily. -     dapagliflozin propanediol (FARXIGA) 5 MG TABS tablet; Take 5 mg by mouth daily.  Atherosclerosis of native coronary artery of native heart without angina pectoris- he has had no recent episodes of angina, will continue aggressive risk factor modification.  Essential hypertension- his blood pressure is well-controlled, electrolytes and renal function are normal.  Routine general medical examination at a health care facility- exam completed, labs ordered and reviewed, vaccines reviewed, colon cancer screening is up-to-date, patient education material was given. -     Lipid panel; Future -     Comprehensive metabolic panel; Future -     TSH; Future -     Urinalysis, Routine w reflex microscopic; Future -     PSA; Future -     CBC with Differential/Platelet; Future  Chronic maxillary sinusitis- will treat the current infection with Augmentin, I've ordered a CT scan of his sinuses to see if there is a mass or obstruction of the sinuses or the ostiomeatal complex, will also screen for polyps and cysts. -     CT MAXILLOFACIAL LIMITED WO CONTRAST; Future -     amoxicillin-clavulanate (AUGMENTIN) 875-125 MG tablet; Take 1 tablet by mouth 2 (two) times daily.  Erectile dysfunction due to arterial insufficiency -     tadalafil (CIALIS) 20 MG tablet; Take 1 tablet (20 mg total) by  mouth daily as needed for erectile dysfunction.  Hypertriglyceridemia- his triglycerides are down to 257, I've asked him to improve his lifestyle modifications. -     Discontinue: insulin detemir (LEVEMIR) 100 unit/ml SOLN; Inject 0.4 mLs (40 Units total) into the skin at bedtime.   I have discontinued Mr. Ginsberg's nitroGLYCERIN, Icosapent Ethyl, amLODipine, insulin detemir, and insulin detemir. I am also having him start on tadalafil, amoxicillin-clavulanate, and dapagliflozin propanediol. Additionally, I am having him maintain his aspirin EC, glucose blood, atorvastatin, ezetimibe,  PARoxetine, fenofibrate, clopidogrel, lisinopril, carvedilol, and SitaGLIPtin-MetFORMIN HCl.  Meds ordered this encounter  Medications  . tadalafil (CIALIS) 20 MG tablet    Sig: Take 1 tablet (20 mg total) by mouth daily as needed for erectile dysfunction.    Dispense:  8 tablet    Refill:  11  . amoxicillin-clavulanate (AUGMENTIN) 875-125 MG tablet    Sig: Take 1 tablet by mouth 2 (two) times daily.    Dispense:  20 tablet    Refill:  0  . DISCONTD: insulin detemir (LEVEMIR) 100 unit/ml SOLN    Sig: Inject 0.4 mLs (40 Units total) into the skin at bedtime.    Dispense:  10 mL    Refill:  3  . SitaGLIPtin-MetFORMIN HCl (JANUMET XR) [phone] MG TB24    Sig: Take 1 tablet by mouth daily.    Dispense:  90 tablet    Refill:  1  . dapagliflozin propanediol (FARXIGA) 5 MG TABS tablet    Sig: Take 5 mg by mouth daily.    Dispense:  90 tablet    Refill:  1     Follow-up: Return in about 3 weeks (around 10/31/2016).  Sanda Linger, MD

## 2016-10-11 ENCOUNTER — Other Ambulatory Visit: Payer: Self-pay | Admitting: Cardiovascular Disease

## 2016-10-11 ENCOUNTER — Encounter: Payer: Self-pay | Admitting: Internal Medicine

## 2016-10-17 ENCOUNTER — Ambulatory Visit (INDEPENDENT_AMBULATORY_CARE_PROVIDER_SITE_OTHER)
Admission: RE | Admit: 2016-10-17 | Discharge: 2016-10-17 | Disposition: A | Discharge status: Home / Self Care | Payer: 59 | Source: Ambulatory Visit | Attending: Internal Medicine | Admitting: Internal Medicine

## 2016-10-17 DIAGNOSIS — J320 Chronic maxillary sinusitis: Secondary | ICD-10-CM

## 2016-11-10 ENCOUNTER — Other Ambulatory Visit: Payer: Self-pay | Admitting: Cardiovascular Disease

## 2016-11-20 ENCOUNTER — Encounter: Payer: Self-pay | Admitting: Internal Medicine

## 2016-11-20 ENCOUNTER — Ambulatory Visit (INDEPENDENT_AMBULATORY_CARE_PROVIDER_SITE_OTHER): Payer: 59 | Admitting: Internal Medicine

## 2016-11-20 VITALS — BP 180/92 | HR 74 | Temp 98.4°F | Resp 16 | Ht 67.0 in | Wt 169.8 lb

## 2016-11-20 DIAGNOSIS — I10 Essential (primary) hypertension: Secondary | ICD-10-CM | POA: Diagnosis not present

## 2016-11-20 DIAGNOSIS — Z22322 Carrier or suspected carrier of Methicillin resistant Staphylococcus aureus: Secondary | ICD-10-CM

## 2016-11-20 DIAGNOSIS — J320 Chronic maxillary sinusitis: Secondary | ICD-10-CM | POA: Diagnosis not present

## 2016-11-20 HISTORY — DX: Carrier or suspected carrier of methicillin resistant Staphylococcus aureus: Z22.322

## 2016-11-20 MED ORDER — AZILSARTAN-CHLORTHALIDONE 40-25 MG PO TABS: 1.0000 | ORAL_TABLET | Freq: Every day | ORAL | 1 refills | Status: DC

## 2016-11-20 MED ORDER — SULFAMETHOXAZOLE-TRIMETHOPRIM 800-160 MG PO TABS: 1.0000 | ORAL_TABLET | Freq: Two times a day (BID) | ORAL | 0 refills | Status: AC

## 2016-11-20 MED ORDER — RIFAMPIN 300 MG PO CAPS: 300.0000 mg | ORAL_CAPSULE | Freq: Two times a day (BID) | ORAL | 0 refills | Status: AC

## 2016-11-20 NOTE — Patient Instructions (Signed)

## 2016-11-20 NOTE — Progress Notes (Signed)
Subjective:  Patient ID: Justin Galvan, male    DOB: 09/07/1960  Age: 56 y.o. MRN: [id number]  CC: Hypertension and Sinusitis   HPI Justin SchilderMichael C Galvan presents for concerns about chronic sinusitis. He tells me that for the last 6 months he's had thick, yellow-green phlegm from his nose. He recently had a CT scan of the sinuses that was unremarkable. He tells me that at some point he was told that he had a positive MRSA test from his nose. He has not gotten any symptom relief with amoxicillin.  His blood pressure has not been well controlled on the current regimen. He admits to a few headaches but he denies blurred vision, chest pain, or shortness of breath.  Outpatient Medications Prior to Visit  Medication Sig Dispense Refill  . amLODipine (NORVASC) 5 MG tablet TAKE 1 TABLET BY MOUTH EVERY DAY 15 tablet 0  . aspirin EC 81 MG tablet Take 81 mg by mouth daily.    Marland Kitchen. atorvastatin (LIPITOR) 80 MG tablet TAKE 1 TABLET (80 MG TOTAL) BY MOUTH DAILY AT 6 PM. 90 tablet 3  . carvedilol (COREG) 25 MG tablet TAKE 1 TABLET TWICE A DAY 180 tablet 0  . clopidogrel (PLAVIX) 75 MG tablet TAKE 1 TABLET DAILY 90 tablet 0  . dapagliflozin propanediol (FARXIGA) 5 MG TABS tablet Take 5 mg by mouth daily. 90 tablet 1  . ezetimibe (ZETIA) 10 MG tablet TAKE 1 TABLET DAILY 30 tablet 0  . fenofibrate 160 MG tablet TAKE 1 TABLET (160 MG TOTAL) BY MOUTH DAILY. 15 tablet 0  . glucose blood test strip Test up to two times daily 100 each 12  . PARoxetine (PAXIL) 40 MG tablet TAKE 1 TABLET BY MOUTH DAILY 90 tablet 2  . SitaGLIPtin-MetFORMIN HCl (JANUMET XR) [phone] MG TB24 Take 1 tablet by mouth daily. 90 tablet 1  . tadalafil (CIALIS) 20 MG tablet Take 1 tablet (20 mg total) by mouth daily as needed for erectile dysfunction. 8 tablet 11  . lisinopril (PRINIVIL,ZESTRIL) 40 MG tablet TAKE 1 TABLET DAILY 90 tablet 0   No facility-administered medications prior to visit.     ROS Review of Systems  Constitutional:  Negative for chills, diaphoresis, fatigue and fever.  HENT: Positive for congestion, postnasal drip, rhinorrhea, sinus pain and sinus pressure. Negative for sore throat and trouble swallowing.   Eyes: Negative for visual disturbance.  Respiratory: Negative for cough, chest tightness, shortness of breath and wheezing.   Cardiovascular: Negative for chest pain, palpitations and leg swelling.  Gastrointestinal: Negative for abdominal pain, blood in stool, constipation, diarrhea, nausea and vomiting.  Endocrine: Negative.   Genitourinary: Negative.   Musculoskeletal: Negative.  Negative for back pain and myalgias.  Skin: Negative.   Allergic/Immunologic: Negative.   Neurological: Positive for headaches. Negative for dizziness, weakness and light-headedness.  Hematological: Negative.  Negative for adenopathy. Does not bruise/bleed easily.  Psychiatric/Behavioral: Negative.     Objective:  BP (!) 180/92 (BP Location: Left Arm, Patient Position: Sitting, Cuff Size: Normal)   Pulse 74   Temp 98.4 F (36.9 C) (Oral)   Resp 16   Ht 5\' 7"  (1.702 m)   Wt 169 lb 12 oz (77 kg)   SpO2 98%   BMI 26.59 kg/m   BP Readings from Last 3 Encounters:  11/20/16 (!) 180/92  10/10/16 138/80  04/11/16 (!) 170/90    Wt Readings from Last 3 Encounters:  11/20/16 169 lb 12 oz (77 kg)  10/10/16 170 lb 4 oz (  77.2 kg)  04/11/16 173 lb (78.5 kg)    Physical Exam  Constitutional: He is oriented to person, place, and time. No distress.  HENT:  Nose: No mucosal edema, rhinorrhea or sinus tenderness. Right sinus exhibits no maxillary sinus tenderness and no frontal sinus tenderness. Left sinus exhibits no maxillary sinus tenderness and no frontal sinus tenderness.  Mouth/Throat: Oropharynx is clear and moist and mucous membranes are normal. Mucous membranes are not pale, not dry and not cyanotic. No oropharyngeal exudate or posterior oropharyngeal erythema.  Eyes: Right eye exhibits no discharge. Left eye  exhibits no discharge. No scleral icterus.  Neck: Normal range of motion. Neck supple. No JVD present. No thyromegaly present.  Cardiovascular: Normal rate, regular rhythm and intact distal pulses.  Exam reveals no gallop.   No murmur heard. Pulmonary/Chest: Effort normal and breath sounds normal. No respiratory distress. He has no wheezes. He has no rales. He exhibits no tenderness.  Abdominal: Soft. Bowel sounds are normal. He exhibits no distension and no mass. There is no tenderness. There is no rebound and no guarding.  Musculoskeletal: Normal range of motion. He exhibits no edema, tenderness or deformity.  Lymphadenopathy:    He has no cervical adenopathy.  Neurological: He is alert and oriented to person, place, and time.  Skin: Skin is warm and dry. No rash noted. He is not diaphoretic. No erythema. No pallor.  Vitals reviewed.   Lab Results  Component Value Date   WBC 7.7 10/10/2016   HGB 13.5 10/10/2016   HCT 39.1 10/10/2016   PLT 293.0 10/10/2016   GLUCOSE 222 (H) 10/10/2016   CHOL 147 10/10/2016   TRIG 257.0 (H) 10/10/2016   HDL 33.50 (L) 10/10/2016   LDLDIRECT 84.0 10/10/2016   LDLCALC 73 04/11/2016   ALT 17 10/10/2016   AST 15 10/10/2016   NA 140 10/10/2016   K 3.9 10/10/2016   CL 102 10/10/2016   CREATININE 1.07 10/10/2016   BUN 16 10/10/2016   CO2 31 10/10/2016   TSH 1.54 10/10/2016   PSA 1.41 10/10/2016   INR 1.04 04/23/2015   HGBA1C 8.0 (H) 10/10/2016   MICROALBUR 1.3 10/10/2016    Ct Maxillofacial Limited Wo Contrast  Result Date: 10/17/2016 CLINICAL DATA:  Initial evaluation for intermittent recurrent sinusitis for 1 year. EXAM: CT PARANASAL SINUS LIMITED WITHOUT CONTRAST TECHNIQUE: Non-contiguous multidetector CT images of the paranasal sinuses were obtained in a single plane without contrast. COMPARISON:  None. FINDINGS: Limited coronal images through the paranasal sinuses were performed. Partially visualized frontal sinuses are clear. Visualized  ethmoidal air cells well pneumatized and clear. Sphenoid sinuses clear bilaterally. Visualized maxillary sinuses are well pneumatized and clear. Ostiomeatal units grossly patent. Nasal cavities grossly clear. Minimal right-to-left bowing of the nasal septum noted. No evidence for acute or chronic sinusitis identified on this limited exam. IMPRESSION: Clear sinuses. No imaging findings to suggest acute or chronic sinusitis identified. Electronically Signed   By: Rise Mu M.D.   On: 10/17/2016 16:09    Assessment & Plan:   Justin Galvan was seen today for hypertension and sinusitis.  Diagnoses and all orders for this visit:  Chronic maxillary sinusitis- will treat for MRSA infection -     sulfamethoxazole-trimethoprim (BACTRIM DS,SEPTRA DS) 800-160 MG tablet; Take 1 tablet by mouth 2 (two) times daily. -     rifampin (RIFADIN) 300 MG capsule; Take 1 capsule (300 mg total) by mouth 2 (two) times daily.  Essential hypertension- his BP is not well controlled, will discontinue the  ACEI and will start an ARB/thiazide diuretic combo in addition to carvedilol and the CCB -     Azilsartan-Chlorthalidone (EDARBYCLOR) 40-25 MG TABS; Take 1 tablet by mouth daily.  MRSA carrier -     sulfamethoxazole-trimethoprim (BACTRIM DS,SEPTRA DS) 800-160 MG tablet; Take 1 tablet by mouth 2 (two) times daily. -     rifampin (RIFADIN) 300 MG capsule; Take 1 capsule (300 mg total) by mouth 2 (two) times daily.   I have discontinued Mr. Henrichs's lisinopril. I am also having him start on sulfamethoxazole-trimethoprim, rifampin, and Azilsartan-Chlorthalidone. Additionally, I am having him maintain his aspirin EC, glucose blood, atorvastatin, ezetimibe, PARoxetine, fenofibrate, clopidogrel, carvedilol, tadalafil, SitaGLIPtin-MetFORMIN HCl, dapagliflozin propanediol, and amLODipine.  Meds ordered this encounter  Medications  . sulfamethoxazole-trimethoprim (BACTRIM DS,SEPTRA DS) 800-160 MG tablet    Sig: Take 1  tablet by mouth 2 (two) times daily.    Dispense:  20 tablet    Refill:  0  . rifampin (RIFADIN) 300 MG capsule    Sig: Take 1 capsule (300 mg total) by mouth 2 (two) times daily.    Dispense:  20 capsule    Refill:  0  . Azilsartan-Chlorthalidone (EDARBYCLOR) 40-25 MG TABS    Sig: Take 1 tablet by mouth daily.    Dispense:  90 tablet    Refill:  1     Follow-up: No Follow-up on file.  Sanda Lingerhomas Jones, MD

## 2016-11-22 ENCOUNTER — Other Ambulatory Visit: Payer: Self-pay | Admitting: Internal Medicine

## 2016-11-22 DIAGNOSIS — E1151 Type 2 diabetes mellitus with diabetic peripheral angiopathy without gangrene: Secondary | ICD-10-CM

## 2016-11-22 DIAGNOSIS — I70209 Unspecified atherosclerosis of native arteries of extremities, unspecified extremity: Principal | ICD-10-CM

## 2016-12-18 ENCOUNTER — Encounter: Payer: Self-pay | Admitting: Internal Medicine

## 2016-12-18 ENCOUNTER — Ambulatory Visit (INDEPENDENT_AMBULATORY_CARE_PROVIDER_SITE_OTHER): Payer: 59 | Admitting: Internal Medicine

## 2016-12-18 VITALS — BP 150/84 | HR 60 | Temp 98.3°F | Resp 16 | Ht 67.0 in | Wt 173.5 lb

## 2016-12-18 DIAGNOSIS — H8112 Benign paroxysmal vertigo, left ear: Secondary | ICD-10-CM | POA: Diagnosis not present

## 2016-12-18 DIAGNOSIS — I10 Essential (primary) hypertension: Secondary | ICD-10-CM | POA: Diagnosis not present

## 2016-12-18 MED ORDER — DIAZEPAM 2 MG PO TABS: 2.0000 mg | ORAL_TABLET | Freq: Four times a day (QID) | ORAL | 0 refills | Status: DC | PRN

## 2016-12-18 NOTE — Progress Notes (Signed)
Subjective:  Patient ID: Justin Galvan, male    DOB: [birth year]  Age: 56 y.o. MRN: [id number]  CC: Hypertension   HPI Justin Galvan presents for concerns about a 3 week history of dizziness and vertigo when he lays in the bed and turns his head from the right to the left side. He had this happen before about 3 years ago and it spontaneously resolved. He has had a few episodes of nausea but denies vomiting. He denies headache, ataxia, slurred speech, numbness, weakness, or tingling. In general, he says the symptoms have improved over the last week. He is also due for blood pressure check. He tells me he is not currently taking the combination of the ARB and thiazide diuretic.  Outpatient Medications Prior to Visit  Medication Sig Dispense Refill  . amLODipine (NORVASC) 5 MG tablet TAKE 1 TABLET BY MOUTH EVERY DAY 15 tablet 0  . aspirin EC 81 MG tablet Take 81 mg by mouth daily.    Marland Kitchen atorvastatin (LIPITOR) 80 MG tablet TAKE 1 TABLET (80 MG TOTAL) BY MOUTH DAILY AT 6 PM. 90 tablet 3  . dapagliflozin propanediol (FARXIGA) 5 MG TABS tablet Take 5 mg by mouth daily. 90 tablet 1  . ezetimibe (ZETIA) 10 MG tablet TAKE 1 TABLET DAILY 30 tablet 0  . fenofibrate 160 MG tablet TAKE 1 TABLET (160 MG TOTAL) BY MOUTH DAILY. 15 tablet 0  . glucose blood test strip Test up to two times daily 100 each 12  . JANUMET XR [phone] MG TB24 TAKE 1 TABLET DAILY 90 tablet 1  . PARoxetine (PAXIL) 40 MG tablet TAKE 1 TABLET BY MOUTH DAILY 90 tablet 2  . tadalafil (CIALIS) 20 MG tablet Take 1 tablet (20 mg total) by mouth daily as needed for erectile dysfunction. 8 tablet 11  . carvedilol (COREG) 25 MG tablet TAKE 1 TABLET TWICE A DAY 180 tablet 0  . clopidogrel (PLAVIX) 75 MG tablet TAKE 1 TABLET DAILY 90 tablet 0  . Azilsartan-Chlorthalidone (EDARBYCLOR) 40-25 MG TABS Take 1 tablet by mouth daily. (Patient not taking: Reported on 12/18/2016) 90 tablet 1   No facility-administered medications prior to visit.      ROS Review of Systems  Constitutional: Negative.  Negative for appetite change, diaphoresis, fatigue and unexpected weight change.  HENT: Negative for facial swelling and trouble swallowing.   Eyes: Negative.  Negative for photophobia and visual disturbance.  Respiratory: Negative.  Negative for cough, chest tightness, shortness of breath and wheezing.   Cardiovascular: Negative for chest pain, palpitations and leg swelling.  Gastrointestinal: Positive for nausea. Negative for abdominal pain, constipation, diarrhea and vomiting.  Endocrine: Negative.   Genitourinary: Negative.  Negative for difficulty urinating.  Musculoskeletal: Negative for gait problem, neck pain and neck stiffness.  Skin: Negative.   Allergic/Immunologic: Negative.   Neurological: Positive for dizziness. Negative for tremors, seizures, syncope, facial asymmetry, speech difficulty, weakness, light-headedness, numbness and headaches.  Hematological: Negative for adenopathy. Does not bruise/bleed easily.  Psychiatric/Behavioral: Negative.     Objective:  BP (!) 150/84 (BP Location: Left Arm, Patient Position: Sitting, Cuff Size: Normal)   Pulse 60   Temp 98.3 F (36.8 C) (Oral)   Resp 16   Ht 5\' 7"  (1.702 m)   Wt 173 lb 8 oz (78.7 kg)   SpO2 98%   BMI 27.17 kg/m   BP Readings from Last 3 Encounters:  12/18/16 (!) 150/84  11/20/16 (!) 180/92  10/10/16 138/80    Wt Readings from  Last 3 Encounters:  12/18/16 173 lb 8 oz (78.7 kg)  11/20/16 169 lb 12 oz (77 kg)  10/10/16 170 lb 4 oz (77.2 kg)    Physical Exam  Constitutional: He is oriented to person, place, and time. No distress.  HENT:  Mouth/Throat: Oropharynx is clear and moist. No oropharyngeal exudate.  Eyes: Pupils are equal, round, and reactive to light. Conjunctivae and EOM are normal. Right eye exhibits no discharge. Left eye exhibits no discharge. No scleral icterus.  Neck: Normal range of motion. Neck supple. No JVD present. No  thyromegaly present.  Cardiovascular: Normal rate, regular rhythm and intact distal pulses.  Exam reveals no gallop and no friction rub.   No murmur heard. Pulmonary/Chest: Effort normal and breath sounds normal. No respiratory distress. He has no wheezes. He has no rales. He exhibits no tenderness.  Abdominal: Soft. Bowel sounds are normal. He exhibits no distension and no mass. There is no tenderness. There is no rebound and no guarding.  Musculoskeletal: Normal range of motion. He exhibits no edema, tenderness or deformity.  Lymphadenopathy:    He has no cervical adenopathy.  Neurological: He is alert and oriented to person, place, and time. He displays no atrophy, no tremor and normal reflexes. No cranial nerve deficit or sensory deficit. He exhibits normal muscle tone. He displays no seizure activity. Coordination and gait normal. He displays no Babinski's sign on the right side. He displays no Babinski's sign on the left side.  Reflex Scores:      Tricep reflexes are 1+ on the right side and 1+ on the left side.      Bicep reflexes are 1+ on the right side and 1+ on the left side.      Brachioradialis reflexes are 1+ on the right side and 1+ on the left side.      Patellar reflexes are 1+ on the right side and 1+ on the left side.      Achilles reflexes are 1+ on the right side and 1+ on the left side. Skin: Skin is warm and dry. No rash noted. He is not diaphoretic. No erythema. No pallor.  Vitals reviewed.   Lab Results  Component Value Date   WBC 7.7 10/10/2016   HGB 13.5 10/10/2016   HCT 39.1 10/10/2016   PLT 293.0 10/10/2016   GLUCOSE 222 (H) 10/10/2016   CHOL 147 10/10/2016   TRIG 257.0 (H) 10/10/2016   HDL 33.50 (L) 10/10/2016   LDLDIRECT 84.0 10/10/2016   LDLCALC 73 04/11/2016   ALT 17 10/10/2016   AST 15 10/10/2016   NA 140 10/10/2016   K 3.9 10/10/2016   CL 102 10/10/2016   CREATININE 1.07 10/10/2016   BUN 16 10/10/2016   CO2 31 10/10/2016   TSH 1.54 10/10/2016    PSA 1.41 10/10/2016   INR 1.04 04/23/2015   HGBA1C 8.0 (H) 10/10/2016   MICROALBUR 1.3 10/10/2016    Ct Maxillofacial Limited Wo Contrast  Result Date: 10/17/2016 CLINICAL DATA:  Initial evaluation for intermittent recurrent sinusitis for 1 year. EXAM: CT PARANASAL SINUS LIMITED WITHOUT CONTRAST TECHNIQUE: Non-contiguous multidetector CT images of the paranasal sinuses were obtained in a single plane without contrast. COMPARISON:  None. FINDINGS: Limited coronal images through the paranasal sinuses were performed. Partially visualized frontal sinuses are clear. Visualized ethmoidal air cells well pneumatized and clear. Sphenoid sinuses clear bilaterally. Visualized maxillary sinuses are well pneumatized and clear. Ostiomeatal units grossly patent. Nasal cavities grossly clear. Minimal right-to-left bowing of the nasal  septum noted. No evidence for acute or chronic sinusitis identified on this limited exam. IMPRESSION: Clear sinuses. No imaging findings to suggest acute or chronic sinusitis identified. Electronically Signed   By: Rise Mu M.D.   On: 10/17/2016 16:09    Assessment & Plan:   Hakim was seen today for hypertension.  Diagnoses and all orders for this visit:  BPPV (benign paroxysmal positional vertigo), left- he has no alarming symptoms other than vertigo and his neurologic exam is normal. Will try a low-dose, short course of diazepam for symptom relief. -     diazepam (VALIUM) 2 MG tablet; Take 1 tablet (2 mg total) by mouth every 6 (six) hours as needed for anxiety.  Essential hypertension- his blood pressure is not adequately well controlled due to noncompliance. He agrees to start taking the ARB/thiazide diuretic combination.   I am having Mr. Behne start on diazepam. I am also having him maintain his aspirin EC, glucose blood, atorvastatin, ezetimibe, PARoxetine, fenofibrate, tadalafil, dapagliflozin propanediol, amLODipine, Azilsartan-Chlorthalidone, and  JANUMET XR.  Meds ordered this encounter  Medications  . diazepam (VALIUM) 2 MG tablet    Sig: Take 1 tablet (2 mg total) by mouth every 6 (six) hours as needed for anxiety.    Dispense:  30 tablet    Refill:  0     Follow-up: Return in about 2 weeks (around 01/01/2017).  Sanda Linger, MD

## 2016-12-18 NOTE — Patient Instructions (Signed)

## 2016-12-19 ENCOUNTER — Other Ambulatory Visit: Payer: Self-pay | Admitting: Internal Medicine

## 2016-12-19 DIAGNOSIS — I10 Essential (primary) hypertension: Secondary | ICD-10-CM

## 2016-12-19 DIAGNOSIS — I2510 Atherosclerotic heart disease of native coronary artery without angina pectoris: Secondary | ICD-10-CM

## 2016-12-29 ENCOUNTER — Other Ambulatory Visit: Payer: Self-pay | Admitting: Cardiovascular Disease

## 2017-01-08 ENCOUNTER — Telehealth: Payer: Self-pay | Admitting: Internal Medicine

## 2017-01-08 ENCOUNTER — Other Ambulatory Visit: Payer: Self-pay | Admitting: Internal Medicine

## 2017-01-08 DIAGNOSIS — H8112 Benign paroxysmal vertigo, left ear: Secondary | ICD-10-CM

## 2017-01-08 NOTE — Telephone Encounter (Signed)
Left detailed message regarding referral to ENT.

## 2017-01-08 NOTE — Telephone Encounter (Signed)
Pts wife called stating that the pt is still having issues with vertigo. She said that they did not know if Dr Yetta Barre would like to refer him to someone that might could help with this. She mentioned ENT? Please advise. She can be reached at [phone] and we can leave a message.

## 2017-01-08 NOTE — Telephone Encounter (Signed)
Referral ordered

## 2017-01-21 ENCOUNTER — Telehealth: Payer: Self-pay | Admitting: *Deleted

## 2017-01-21 DIAGNOSIS — H8112 Benign paroxysmal vertigo, left ear: Secondary | ICD-10-CM

## 2017-01-21 NOTE — Telephone Encounter (Signed)
Re4c'd call pt requesting refill on his Diazepam. Check  registry last filled 12/22/2016. Dr. Yetta Barre is out of the office this week pls advise...Raechel Chute

## 2017-01-21 NOTE — Telephone Encounter (Signed)
OK to fill this/these prescription(s) with additional refills x0 Thank you!  

## 2017-01-22 MED ORDER — DIAZEPAM 2 MG PO TABS: 2.0000 mg | ORAL_TABLET | Freq: Four times a day (QID) | ORAL | 0 refills | Status: DC | PRN

## 2017-01-22 NOTE — Telephone Encounter (Signed)
Notified pt rx called into [facility] had to leave on pharmacy vm....Justin Galvan pt

## 2017-02-10 ENCOUNTER — Other Ambulatory Visit: Payer: Self-pay | Admitting: Cardiovascular Disease

## 2017-02-11 ENCOUNTER — Encounter: Payer: Self-pay | Admitting: Internal Medicine

## 2017-03-19 ENCOUNTER — Other Ambulatory Visit: Payer: Self-pay | Admitting: Internal Medicine

## 2017-03-19 DIAGNOSIS — E785 Hyperlipidemia, unspecified: Secondary | ICD-10-CM

## 2017-03-19 DIAGNOSIS — I10 Essential (primary) hypertension: Secondary | ICD-10-CM

## 2017-03-19 DIAGNOSIS — I2510 Atherosclerotic heart disease of native coronary artery without angina pectoris: Secondary | ICD-10-CM

## 2017-04-29 ENCOUNTER — Other Ambulatory Visit: Payer: Self-pay | Admitting: Internal Medicine

## 2017-04-29 DIAGNOSIS — I70209 Unspecified atherosclerosis of native arteries of extremities, unspecified extremity: Principal | ICD-10-CM

## 2017-04-29 DIAGNOSIS — E1151 Type 2 diabetes mellitus with diabetic peripheral angiopathy without gangrene: Secondary | ICD-10-CM

## 2017-05-21 ENCOUNTER — Other Ambulatory Visit: Payer: Self-pay | Admitting: Internal Medicine

## 2017-05-21 DIAGNOSIS — E1151 Type 2 diabetes mellitus with diabetic peripheral angiopathy without gangrene: Secondary | ICD-10-CM

## 2017-05-21 DIAGNOSIS — I70209 Unspecified atherosclerosis of native arteries of extremities, unspecified extremity: Principal | ICD-10-CM

## 2017-06-02 ENCOUNTER — Other Ambulatory Visit: Payer: Self-pay | Admitting: Cardiovascular Disease

## 2017-08-16 ENCOUNTER — Telehealth: Payer: Self-pay | Admitting: Internal Medicine

## 2017-08-16 DIAGNOSIS — F418 Other specified anxiety disorders: Secondary | ICD-10-CM

## 2017-08-16 NOTE — Telephone Encounter (Signed)
Pt's wife calling in stating he is out of the medication.

## 2017-08-16 NOTE — Telephone Encounter (Signed)
Pt will have to schedule an appt before I can send in any refills.

## 2017-08-16 NOTE — Telephone Encounter (Signed)
Reviewed chart pt is up-to-date sent refills to [facility].../lmb  

## 2017-09-09 ENCOUNTER — Other Ambulatory Visit: Payer: Self-pay | Admitting: Internal Medicine

## 2017-09-09 DIAGNOSIS — H8112 Benign paroxysmal vertigo, left ear: Secondary | ICD-10-CM

## 2017-09-09 NOTE — Telephone Encounter (Unsigned)
Copied from CRM [id number]. Topic: Quick Communication - See Telephone Encounter >> Sep 09, 2017 12:07 PM Waymon Amato wrote: Pt is needing a refill on the diazepan  [facility] [address] [phone]  Best number is [phone]

## 2017-09-10 ENCOUNTER — Other Ambulatory Visit: Payer: Self-pay | Admitting: Internal Medicine

## 2017-09-10 DIAGNOSIS — H8112 Benign paroxysmal vertigo, left ear: Secondary | ICD-10-CM

## 2017-09-10 MED ORDER — DIAZEPAM 2 MG PO TABS: 2.0000 mg | ORAL_TABLET | Freq: Four times a day (QID) | ORAL | 0 refills | Status: DC | PRN

## 2017-09-10 NOTE — Telephone Encounter (Signed)
Rx refill request: Diazepam 2 mg        Last filled: 01/22/17  #30  LOV: 01/08/17    PCP: Yetta Barre  Pharmacy: verified

## 2017-09-10 NOTE — Telephone Encounter (Signed)
Pt has an appt coming up on 10/15/2017.   RF rq for diazepam.

## 2017-10-15 ENCOUNTER — Ambulatory Visit (INDEPENDENT_AMBULATORY_CARE_PROVIDER_SITE_OTHER): Payer: 59 | Admitting: Internal Medicine

## 2017-10-15 ENCOUNTER — Encounter: Payer: Self-pay | Admitting: Internal Medicine

## 2017-10-15 ENCOUNTER — Other Ambulatory Visit (INDEPENDENT_AMBULATORY_CARE_PROVIDER_SITE_OTHER): Payer: 59

## 2017-10-15 VITALS — BP 148/92 | HR 67 | Temp 97.8°F | Resp 16 | Ht 67.0 in | Wt 177.2 lb

## 2017-10-15 DIAGNOSIS — J320 Chronic maxillary sinusitis: Secondary | ICD-10-CM

## 2017-10-15 DIAGNOSIS — Z0000 Encounter for general adult medical examination without abnormal findings: Secondary | ICD-10-CM

## 2017-10-15 DIAGNOSIS — I2510 Atherosclerotic heart disease of native coronary artery without angina pectoris: Secondary | ICD-10-CM

## 2017-10-15 DIAGNOSIS — I70209 Unspecified atherosclerosis of native arteries of extremities, unspecified extremity: Secondary | ICD-10-CM

## 2017-10-15 DIAGNOSIS — E781 Pure hyperglyceridemia: Secondary | ICD-10-CM

## 2017-10-15 DIAGNOSIS — E1151 Type 2 diabetes mellitus with diabetic peripheral angiopathy without gangrene: Secondary | ICD-10-CM

## 2017-10-15 DIAGNOSIS — I10 Essential (primary) hypertension: Secondary | ICD-10-CM

## 2017-10-15 DIAGNOSIS — E785 Hyperlipidemia, unspecified: Secondary | ICD-10-CM | POA: Diagnosis not present

## 2017-10-15 LAB — COMPREHENSIVE METABOLIC PANEL
ALBUMIN: 4.5 g/dL (ref 3.5–5.2)
ALK PHOS: 55 U/L (ref 39–117)
ALT: 19 U/L (ref 0–53)
AST: 16 U/L (ref 0–37)
BUN: 18 mg/dL (ref 6–23)
CO2: 29 mEq/L (ref 19–32)
Calcium: 9.5 mg/dL (ref 8.4–10.5)
Chloride: 100 mEq/L (ref 96–112)
Creatinine, Ser: 0.89 mg/dL (ref 0.40–1.50)
GFR: 92.87 mL/min (ref >60.00)
Glucose, Bld: 177 mg/dL — ABNORMAL HIGH (ref 70–99)
POTASSIUM: 4.4 meq/L (ref 3.5–5.1)
Sodium: 137 mEq/L (ref 135–145)
TOTAL PROTEIN: 6.9 g/dL (ref 6.0–8.3)
Total Bilirubin: 0.7 mg/dL (ref 0.2–1.2)

## 2017-10-15 LAB — URINALYSIS, ROUTINE W REFLEX MICROSCOPIC
BILIRUBIN URINE: NEGATIVE
Hgb urine dipstick: NEGATIVE
KETONES UR: NEGATIVE
LEUKOCYTES UA: NEGATIVE
Nitrite: NEGATIVE
PH: 6.5 (ref 5.0–8.0)
Specific Gravity, Urine: 1.02 (ref 1.000–1.030)
TOTAL PROTEIN, URINE-UPE24: NEGATIVE
UROBILINOGEN UA: 0.2 (ref 0.0–1.0)
Urine Glucose: 100 — AB

## 2017-10-15 LAB — LIPID PANEL
CHOLESTEROL: 177 mg/dL (ref 0–200)
HDL: 38.5 mg/dL — AB (ref >39.00)
NonHDL: 138.38
TRIGLYCERIDES: 209 mg/dL — AB (ref 0.0–149.0)
Total CHOL/HDL Ratio: 5
VLDL: 41.8 mg/dL — AB (ref 0.0–40.0)

## 2017-10-15 LAB — TSH: TSH: 2.76 u[IU]/mL (ref 0.35–4.50)

## 2017-10-15 LAB — CBC WITH DIFFERENTIAL/PLATELET
BASOS PCT: 0.2 % (ref 0.0–3.0)
Basophils Absolute: 0 10*3/uL (ref 0.0–0.1)
EOS ABS: 0.3 10*3/uL (ref 0.0–0.7)
Eosinophils Relative: 3.4 % (ref 0.0–5.0)
HEMATOCRIT: 41.9 % (ref 39.0–52.0)
Hemoglobin: 14.5 g/dL (ref 13.0–17.0)
Lymphocytes Relative: 24 % (ref 12.0–46.0)
Lymphs Abs: 1.9 10*3/uL (ref 0.7–4.0)
MCHC: 34.6 g/dL (ref 30.0–36.0)
MCV: 85.6 fl (ref 78.0–100.0)
MONO ABS: 0.7 10*3/uL (ref 0.1–1.0)
Monocytes Relative: 8.7 % (ref 3.0–12.0)
NEUTROS ABS: 4.9 10*3/uL (ref 1.4–7.7)
Neutrophils Relative %: 63.7 % (ref 43.0–77.0)
PLATELETS: 241 10*3/uL (ref 150.0–400.0)
RBC: 4.9 Mil/uL (ref 4.22–5.81)
RDW: 13.9 % (ref 11.5–15.5)
WBC: 7.8 10*3/uL (ref 4.0–10.5)

## 2017-10-15 LAB — MICROALBUMIN / CREATININE URINE RATIO
Creatinine,U: 88.5 mg/dL
MICROALB/CREAT RATIO: 0.9 mg/g (ref 0.0–30.0)
Microalb, Ur: 0.8 mg/dL (ref 0.0–1.9)

## 2017-10-15 LAB — HEMOGLOBIN A1C: Hgb A1c MFr Bld: 7.1 % — ABNORMAL HIGH (ref 4.6–6.5)

## 2017-10-15 LAB — LDL CHOLESTEROL, DIRECT: LDL DIRECT: 108 mg/dL

## 2017-10-15 LAB — PSA: PSA: 1.87 ng/mL (ref 0.10–4.00)

## 2017-10-15 LAB — HM DIABETES FOOT EXAM

## 2017-10-15 MED ORDER — LISINOPRIL 40 MG PO TABS: 40.0000 mg | ORAL_TABLET | Freq: Every day | ORAL | 1 refills | Status: DC

## 2017-10-15 MED ORDER — SITAGLIP PHOS-METFORMIN HCL ER 100-1000 MG PO TB24: 1.0000 | ORAL_TABLET | Freq: Every day | ORAL | 1 refills | Status: DC

## 2017-10-15 MED ORDER — AZILSARTAN-CHLORTHALIDONE 40-12.5 MG PO TABS: 1.0000 | ORAL_TABLET | Freq: Every day | ORAL | 1 refills | Status: DC

## 2017-10-15 MED ORDER — ICOSAPENT ETHYL 1 G PO CAPS: 2.0000 | ORAL_CAPSULE | Freq: Two times a day (BID) | ORAL | 1 refills | Status: DC

## 2017-10-15 MED ORDER — DAPAGLIFLOZIN PROPANEDIOL 5 MG PO TABS: 5.0000 mg | ORAL_TABLET | Freq: Every day | ORAL | 1 refills | Status: DC

## 2017-10-15 MED ORDER — ATORVASTATIN CALCIUM 80 MG PO TABS: ORAL_TABLET | ORAL | 1 refills | Status: DC

## 2017-10-15 MED ORDER — ASPIRIN EC 81 MG PO TBEC: 81.0000 mg | DELAYED_RELEASE_TABLET | Freq: Every day | ORAL | 1 refills | Status: DC

## 2017-10-15 NOTE — Patient Instructions (Signed)

## 2017-10-15 NOTE — Progress Notes (Signed)
Subjective:  Patient ID: Justin Galvan, male    DOB: 04/11/1961  Age: 57 y.o. MRN: [id number]  CC: Diabetes; Hypertension; Hyperlipidemia; Annual Exam; and Coronary Artery Disease   HPI Justin Galvan presents for a CPX.  He struggles with compliance.  According to his prescription refills he is not taking several of his meds.  He is not a very good historian either.  He continues to complain of chronic sinus symptoms and intermittent vertigo.  The vertigo is controlled with the occasional dose of diazepam.  He is not taking anything for his sinus symptoms.  He denies any recent episodes of CP, DOE, palpitations, edema, or fatigue.  He does not monitor his blood pressure or his blood sugar.  He thinks both have been well controlled.   Outpatient Medications Prior to Visit  Medication Sig Dispense Refill  . clopidogrel (PLAVIX) 75 MG tablet TAKE 1 TABLET DAILY 90 tablet 0  . glucose blood test strip Test up to two times daily 100 each 12  . PARoxetine (PAXIL) 40 MG tablet Take 1 tablet (40 mg total) by mouth daily. Annual appt due in June must see provider for future refills 90 tablet 0  . tadalafil (CIALIS) 20 MG tablet Take 1 tablet (20 mg total) by mouth daily as needed for erectile dysfunction. 8 tablet 11  . amLODipine (NORVASC) 5 MG tablet Take 1 tablet (5 mg total) by mouth daily. Patient overdue for an appointment. Must call and schedule for further refills 3rd/final attempt 15 tablet 0  . aspirin EC 81 MG tablet Take 81 mg by mouth daily.    Marland Kitchen. atorvastatin (LIPITOR) 80 MG tablet TAKE 1 TABLET (80 MG TOTAL) BY MOUTH DAILY AT 6 PM. 90 tablet 3  . Azilsartan-Chlorthalidone (EDARBYCLOR) 40-25 MG TABS Take 1 tablet by mouth daily. 90 tablet 1  . carvedilol (COREG) 25 MG tablet TAKE 1 TABLET TWICE A DAY 180 tablet 0  . dapagliflozin propanediol (FARXIGA) 5 MG TABS tablet Take 5 mg by mouth daily. 90 tablet 1  . diazepam (VALIUM) 2 MG tablet Take 1 tablet (2 mg total) by mouth  every 6 (six) hours as needed for anxiety. 30 tablet 0  . ezetimibe (ZETIA) 10 MG tablet TAKE 1 TABLET DAILY 30 tablet 0  . fenofibrate 160 MG tablet TAKE 1 TABLET (160 MG TOTAL) BY MOUTH DAILY. 15 tablet 0  . JANUMET XR [phone] MG TB24 TAKE 1 TABLET DAILY 90 tablet 1  . lisinopril (PRINIVIL,ZESTRIL) 40 MG tablet TAKE 1 TABLET DAILY 90 tablet 0   No facility-administered medications prior to visit.     ROS Review of Systems  Constitutional: Negative.  Negative for diaphoresis, fatigue and unexpected weight change.  HENT: Positive for congestion, postnasal drip and rhinorrhea. Negative for facial swelling, sinus pressure, sinus pain, sneezing, sore throat, trouble swallowing and voice change.        +recurrent episodes of vertigo  Eyes: Negative for visual disturbance.  Respiratory: Negative for cough, chest tightness, shortness of breath and wheezing.   Cardiovascular: Negative for chest pain, palpitations and leg swelling.  Gastrointestinal: Negative.  Negative for abdominal pain, constipation, diarrhea, nausea and vomiting.  Endocrine: Negative.  Negative for polydipsia, polyphagia and polyuria.  Genitourinary: Negative.  Negative for difficulty urinating, dysuria, penile swelling, scrotal swelling, testicular pain and urgency.  Musculoskeletal: Negative.  Negative for arthralgias and myalgias.  Skin: Negative.  Negative for color change, pallor and rash.  Neurological: Negative.  Negative for dizziness, weakness, light-headedness and  headaches.  Hematological: Negative for adenopathy. Does not bruise/bleed easily.  Psychiatric/Behavioral: Negative.     Objective:  BP (!) 148/92 (BP Location: Left Arm, Patient Position: Sitting, Cuff Size: Normal)   Pulse 67   Temp 97.8 F (36.6 C) (Oral)   Resp 16   Ht 5\' 7"  (1.702 m)   Wt 177 lb 4 oz (80.4 kg)   SpO2 96%   BMI 27.76 kg/m   BP Readings from Last 3 Encounters:  10/15/17 (!) 148/92  12/18/16 (!) 150/84  11/20/16 (!) 180/92     Wt Readings from Last 3 Encounters:  10/15/17 177 lb 4 oz (80.4 kg)  12/18/16 173 lb 8 oz (78.7 kg)  11/20/16 169 lb 12 oz (77 kg)    Physical Exam  Constitutional: He is oriented to person, place, and time. No distress.  HENT:  Mouth/Throat: Oropharynx is clear and moist. No oropharyngeal exudate.  Eyes: Conjunctivae are normal. No scleral icterus.  Neck: Normal range of motion. Neck supple. No JVD present. No thyromegaly present.  Cardiovascular: Normal rate, regular rhythm, S1 normal, S2 normal and normal heart sounds. Exam reveals no gallop and no S4.  No murmur heard. EKG ---  Sinus  Rhythm  -Old inferior-apical infarct.   -  Nonspecific T-abnormality.   ABNORMAL - no change from the prior EKG  Pulmonary/Chest: Effort normal and breath sounds normal. No respiratory distress. He has no wheezes. He has no rales.  Abdominal: Soft. Normal appearance and bowel sounds are normal. He exhibits no mass. There is no hepatosplenomegaly. There is no tenderness. No hernia. Hernia confirmed negative in the right inguinal area and confirmed negative in the left inguinal area.  Genitourinary: Rectum normal, testes normal and penis normal. Rectal exam shows no external hemorrhoid, no internal hemorrhoid, no fissure, no mass, no tenderness, anal tone normal and guaiac negative stool. Prostate is enlarged (1+ smooth symm BPH). Prostate is not tender. Right testis shows no mass, no swelling and no tenderness. Right testis is descended. Left testis shows no mass, no swelling and no tenderness. Left testis is descended. Circumcised. No penile erythema or penile tenderness. No discharge found.  Musculoskeletal: Normal range of motion. He exhibits no edema, tenderness or deformity.  Lymphadenopathy:    He has no cervical adenopathy. No inguinal adenopathy noted on the right or left side.  Neurological: He is alert and oriented to person, place, and time.  Skin: Skin is warm and dry. No rash noted. He  is not diaphoretic.  Vitals reviewed.   Lab Results  Component Value Date   WBC 7.8 10/15/2017   HGB 14.5 10/15/2017   HCT 41.9 10/15/2017   PLT 241.0 10/15/2017   GLUCOSE 177 (H) 10/15/2017   CHOL 177 10/15/2017   TRIG 209.0 (H) 10/15/2017   HDL 38.50 (L) 10/15/2017   LDLDIRECT 108.0 10/15/2017   LDLCALC 73 04/11/2016   ALT 19 10/15/2017   AST 16 10/15/2017   NA 137 10/15/2017   K 4.4 10/15/2017   CL 100 10/15/2017   CREATININE 0.89 10/15/2017   BUN 18 10/15/2017   CO2 29 10/15/2017   TSH 2.76 10/15/2017   PSA 1.87 10/15/2017   INR 1.04 04/23/2015   HGBA1C 7.1 (H) 10/15/2017   MICROALBUR 0.8 10/15/2017    Ct Maxillofacial Limited Wo Contrast  Result Date: 10/17/2016 CLINICAL DATA:  Initial evaluation for intermittent recurrent sinusitis for 1 year. EXAM: CT PARANASAL SINUS LIMITED WITHOUT CONTRAST TECHNIQUE: Non-contiguous multidetector CT images of the paranasal sinuses were obtained  in a single plane without contrast. COMPARISON:  None. FINDINGS: Limited coronal images through the paranasal sinuses were performed. Partially visualized frontal sinuses are clear. Visualized ethmoidal air cells well pneumatized and clear. Sphenoid sinuses clear bilaterally. Visualized maxillary sinuses are well pneumatized and clear. Ostiomeatal units grossly patent. Nasal cavities grossly clear. Minimal right-to-left bowing of the nasal septum noted. No evidence for acute or chronic sinusitis identified on this limited exam. IMPRESSION: Clear sinuses. No imaging findings to suggest acute or chronic sinusitis identified. Electronically Signed   By: Rise Mu M.D.   On: 10/17/2016 16:09    Assessment & Plan:   Justin Galvan was seen today for diabetes, hypertension, hyperlipidemia, annual exam and coronary artery disease.  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  I would like for him to  get tighter blood sugar control due to his history of coronary artery disease.  I have asked him to restart the combination of a GLP-1 agonist, DPP 4 inhibitor, and metformin. -     Comprehensive metabolic panel; Future -     Hemoglobin A1c; Future -     Microalbumin / creatinine urine ratio; Future -     Ambulatory referral to Ophthalmology -     Discontinue: lisinopril (PRINIVIL,ZESTRIL) 40 MG tablet; Take 1 tablet (40 mg total) by mouth daily. -     atorvastatin (LIPITOR) 80 MG tablet; TAKE 1 TABLET (80 MG TOTAL) BY MOUTH DAILY AT 6 PM. -     Azilsartan-Chlorthalidone (EDARBYCLOR) 40-12.5 MG TABS; Take 1 tablet by mouth daily. -     SitaGLIPtin-MetFORMIN HCl (JANUMET XR) [phone] MG TB24; Take 1 tablet by mouth daily. -     dapagliflozin propanediol (FARXIGA) 5 MG TABS tablet; Take 5 mg by mouth daily.  Atherosclerosis of native coronary artery of native heart without angina pectoris- He denies any recent episodes of angina.  His EKG shows no changes.  I have asked him to improve his risk factor modifications.  I have also asked him to have a routine follow-up with cardiology. -     Discontinue: lisinopril (PRINIVIL,ZESTRIL) 40 MG tablet; Take 1 tablet (40 mg total) by mouth daily. -     atorvastatin (LIPITOR) 80 MG tablet; TAKE 1 TABLET (80 MG TOTAL) BY MOUTH DAILY AT 6 PM. -     aspirin EC 81 MG tablet; Take 1 tablet (81 mg total) by mouth daily. -     EKG 12-Lead -     Azilsartan-Chlorthalidone (EDARBYCLOR) 40-12.5 MG TABS; Take 1 tablet by mouth daily. -     Ambulatory referral to Cardiology -     Icosapent Ethyl (VASCEPA) 1 g CAPS; Take 2 capsules (2 g total) by mouth 2 (two) times daily.  Essential hypertension- His blood pressure is not adequately well controlled.  Will restart the ARB and thiazide diuretic. -     CBC with Differential/Platelet; Future -     Comprehensive metabolic panel; Future -     Urinalysis, Routine w reflex microscopic; Future -     TSH; Future -      Discontinue: lisinopril (PRINIVIL,ZESTRIL) 40 MG tablet; Take 1 tablet (40 mg total) by mouth daily. -     EKG 12-Lead -     Azilsartan-Chlorthalidone (EDARBYCLOR) 40-12.5 MG TABS; Take 1 tablet by mouth daily.  Routine general medical examination at a health care facility- Exam completed, labs reviewed, vaccines reviewed, screening for colon cancer  is up-to-date, patient education material was given. -     Lipid panel; Future -     PSA; Future -     HIV antibody; Future  Chronic maxillary sinusitis -     Ambulatory referral to ENT  Hyperlipidemia with target LDL less than 70-he has not achieved his LDL goal.  I have asked him to restart high intensity, high-dose statin for CV risk reduction. -     atorvastatin (LIPITOR) 80 MG tablet; TAKE 1 TABLET (80 MG TOTAL) BY MOUTH DAILY AT 6 PM.  Hypertriglyceridemia- His triglycerides remain mildly elevated.  I have asked him to start taking VASCEPA for CV risk reduction. -     Icosapent Ethyl (VASCEPA) 1 g CAPS; Take 2 capsules (2 g total) by mouth 2 (two) times daily.   I have discontinued Justin Galvan. Justin Galvan's ezetimibe, fenofibrate, Azilsartan-Chlorthalidone, carvedilol, lisinopril, amLODipine, diazepam, and lisinopril. I have changed his JANUMET XR to SitaGLIPtin-MetFORMIN HCl. I have also changed his aspirin EC. Additionally, I am having him start on Azilsartan-Chlorthalidone and Icosapent Ethyl. Lastly, I am having him maintain his glucose blood, tadalafil, clopidogrel, PARoxetine, atorvastatin, and dapagliflozin propanediol.  Meds ordered this encounter  Medications  . DISCONTD: lisinopril (PRINIVIL,ZESTRIL) 40 MG tablet    Sig: Take 1 tablet (40 mg total) by mouth daily.    Dispense:  90 tablet    Refill:  1  . atorvastatin (LIPITOR) 80 MG tablet    Sig: TAKE 1 TABLET (80 MG TOTAL) BY MOUTH DAILY AT 6 PM.    Dispense:  90 tablet    Refill:  1  . aspirin EC 81 MG tablet    Sig: Take 1 tablet (81 mg total) by mouth daily.    Dispense:   90 tablet    Refill:  1  . Azilsartan-Chlorthalidone (EDARBYCLOR) 40-12.5 MG TABS    Sig: Take 1 tablet by mouth daily.    Dispense:  90 tablet    Refill:  1  . SitaGLIPtin-MetFORMIN HCl (JANUMET XR) [phone] MG TB24    Sig: Take 1 tablet by mouth daily.    Dispense:  90 tablet    Refill:  1  . dapagliflozin propanediol (FARXIGA) 5 MG TABS tablet    Sig: Take 5 mg by mouth daily.    Dispense:  90 tablet    Refill:  1  . Icosapent Ethyl (VASCEPA) 1 g CAPS    Sig: Take 2 capsules (2 g total) by mouth 2 (two) times daily.    Dispense:  360 capsule    Refill:  1     Follow-up: Return in about 4 months (around 02/14/2018).  Sanda Linger, MD

## 2017-10-16 LAB — HIV ANTIBODY (ROUTINE TESTING W REFLEX): HIV 1&2 Ab, 4th Generation: NONREACTIVE

## 2017-10-17 ENCOUNTER — Telehealth: Payer: Self-pay

## 2017-10-17 NOTE — Telephone Encounter (Signed)
PA for Tressia Danasdarbyclor is approved. PA for Marcelline DeistFarxiga is not required due to the medication is on formulary.  Can you let patient know.

## 2017-10-17 NOTE — Telephone Encounter (Signed)
Edarbyclor PA Key: [id number] Farxiga 5 mg Key: [id number]

## 2017-10-17 NOTE — Telephone Encounter (Signed)
Left message informing patient.

## 2017-10-17 NOTE — Telephone Encounter (Signed)
PA for Justin DeistFarxiga came back that PA was not required and that medication is covered.

## 2017-10-27 ENCOUNTER — Other Ambulatory Visit: Payer: Self-pay | Admitting: Internal Medicine

## 2017-10-27 DIAGNOSIS — H8112 Benign paroxysmal vertigo, left ear: Secondary | ICD-10-CM

## 2017-11-09 ENCOUNTER — Other Ambulatory Visit: Payer: Self-pay | Admitting: Internal Medicine

## 2017-11-09 DIAGNOSIS — F418 Other specified anxiety disorders: Secondary | ICD-10-CM

## 2017-12-26 ENCOUNTER — Ambulatory Visit (INDEPENDENT_AMBULATORY_CARE_PROVIDER_SITE_OTHER): Payer: 59 | Admitting: Cardiology

## 2017-12-26 ENCOUNTER — Encounter (INDEPENDENT_AMBULATORY_CARE_PROVIDER_SITE_OTHER): Payer: Self-pay

## 2017-12-26 ENCOUNTER — Encounter: Payer: Self-pay | Admitting: Cardiology

## 2017-12-26 DIAGNOSIS — I2510 Atherosclerotic heart disease of native coronary artery without angina pectoris: Secondary | ICD-10-CM

## 2017-12-26 DIAGNOSIS — E785 Hyperlipidemia, unspecified: Secondary | ICD-10-CM

## 2017-12-26 DIAGNOSIS — I70209 Unspecified atherosclerosis of native arteries of extremities, unspecified extremity: Secondary | ICD-10-CM | POA: Diagnosis not present

## 2017-12-26 DIAGNOSIS — E1151 Type 2 diabetes mellitus with diabetic peripheral angiopathy without gangrene: Secondary | ICD-10-CM | POA: Diagnosis not present

## 2017-12-26 MED ORDER — CLOPIDOGREL BISULFATE 75 MG PO TABS: 75.0000 mg | ORAL_TABLET | Freq: Every day | ORAL | 2 refills | Status: DC

## 2017-12-26 MED ORDER — EZETIMIBE 10 MG PO TABS: 10.0000 mg | ORAL_TABLET | Freq: Every day | ORAL | 3 refills | Status: DC

## 2017-12-26 MED ORDER — ATORVASTATIN CALCIUM 80 MG PO TABS: ORAL_TABLET | ORAL | 2 refills | Status: DC

## 2017-12-26 NOTE — Patient Instructions (Signed)
Medication Instructions:  Your physician has recommended you make the following change in your medication:  1. Start Zetia one tablet, by mouth (10 mg ) daily. 2. Sent in Plavix and Lipitor today to patient's requested pharmacy.    Labwork: Your physician recommends that you return for a FASTING lipid profile: November 5 between 8-5.    Testing/Procedures: Your physician has requested that you have en exercise stress myoview. For further information please visit [email or web address] Please follow instruction sheet, as given.    Follow-Up: Your physician recommends that you keep your scheduled  follow-up appointment with Robbie Lis, PA.    Any Other Special Instructions Will Be Listed Below (If Applicable). You are scheduled for a Myocardial Perfusion Imaging Study on Thursday, September 5 at 7:45 am .   Please arrive 15 minutes prior to your appointment time for registration and insurance purposes.   The test will take approximately 3 to 4 hours to complete; you may bring reading material. If someone comes with you to your appointment, they will need to remain in the main lobby due to limited space in the testing area.   If you are pregnant or breastfeeding, please notify the nuclear lab prior to your appointment.   How to prepare for your Myocardial Perfusion test:   Do not eat or drink 3 hours prior to your test, except you may have water.    Do not consume products containing caffeine (regular or decaffeinated) 12 hours prior to your test (ex: coffee, chocolate, soda, tea)   Do bring a list of your current medications with you. If not listed below, you may take your medications as normal.    Bring any held medication to your appointment, as you may be required to take it once the test is complete.   Do wear comfortable clothes (no dresses or overalls) and walking shoes. Tennis shoes are preferred. No heels or open toed shoes.  Do not wear cologne, perfume, aftershave  or lotions (deodorant is allowed).   If these instructions are not followed, you test will have to be rescheduled.   Please report to [address] for your test. If you have questions or concerns about your appointment, please call the Nuclear Lab at #[phone].  If you cannot keep your appointment, please provide 24 hour notification to the Nuclear lab to avoid a possible $50 charge to your account.         If you need a refill on your cardiac medications before your next appointment, please call your pharmacy.

## 2017-12-26 NOTE — Progress Notes (Signed)
12/26/2017 Justin Galvan   1960/05/17  [id number]  Primary Physician Etta Grandchild, MD Primary Cardiologist: Dr. Clifton James   Reason for Visit/CC: f/u CAD, neck pain   HPI:  Pt is a 57 year old male with past medical history of HTN, HLD, DM and CAD. He had multiple PCIs in the past including stent to his RCA in 1997 and DES 2 to proximal to mid RCA in 2005. He underwent repeat cardiac catheterization in January 2015 which showed Mid LAD 90 (hazy), ost Dx 80, prox RCA stent 50 ISR, mid RCA 100 (L-R collats), inf HK, EF 40-45%. LAD FFR 0.91 => 0.80. PCI: Promus (3 x 12 mm) DES to the mid LAD.  He required repeat cardiac catheterization in January 2017 which showed very small diagonal branch at the ostium where the vessel was jailed by the LAD stent, the vessel was too small for PCI, chronic occlusion of mid RCA with distal vessel filled from left-to-right collaterals. Medical therapy was recommended. Imdur was added as an antianginal. Fenofibrate was also added for cholesterol. Imdur however was later discontinued after the patient started using Viagra.  He was seen back for post hospital follow-up May 10, 2015 and was doing well from a cardiac standpoint without any chest pain or dyspnea.  He was lost to follow-up and has not been seen since 2017  He presents back to clinic today for reassessment.  He is currently chest pain-free but reports that he has experienced anterior neck burning with heavy physical activity.  This is not frequent.  His last occurrence was about a month ago.  Neck discomfort is usually his anginal equivalent.  He denies any anterior chest pain and no dyspnea.  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   EKG today is slightly changed from prior EKG in 2017.  There appears to be new lateral Qwaves.  He has inferior Q waves that are old.  EKG also shows sinus rhythm with PVCs.  Heart rate 69 bpm.  BP is controlled at 126/80.  His PCP checked  recent labs October 15, 2017 and LDL was elevated at 108 mg/dL.  He is on Lipitor 80 mg and reports full daily compliance.  Hemoglobin A1c in June was 7.1.  His creatinine was normal at 0.8.  Potassium 4.4.  ALT 19.   Current Meds  Medication Sig  . aspirin EC 81 MG tablet Take 1 tablet (81 mg total) by mouth daily.  Marland Kitchen atorvastatin (LIPITOR) 80 MG tablet TAKE 1 TABLET (80 MG TOTAL) BY MOUTH DAILY AT 6 PM.  . Azilsartan-Chlorthalidone (EDARBYCLOR) 40-12.5 MG TABS Take 1 tablet by mouth daily.  . clopidogrel (PLAVIX) 75 MG tablet Take 1 tablet (75 mg total) by mouth daily.  . dapagliflozin propanediol (FARXIGA) 5 MG TABS tablet Take 5 mg by mouth daily.  . diazepam (VALIUM) 2 MG tablet TAKE 1 TABLET (2 MG TOTAL) BY MOUTH EVERY 6 (SIX) HOURS AS NEEDED FOR ANXIETY.  Marland Kitchen glucose blood test strip Test up to two times daily  . Icosapent Ethyl (VASCEPA) 1 g CAPS Take 2 capsules (2 g total) by mouth 2 (two) times daily.  Marland Kitchen PARoxetine (PAXIL) 40 MG tablet TAKE 1 TABLET BY MOUTH DAILY. ANNUAL APPT DUE IN JUNE MUST SEE PROVIDER FOR FUTURE REFILLS  . SitaGLIPtin-MetFORMIN HCl (JANUMET XR) [phone] MG TB24 Take 1 tablet by mouth daily.  . tadalafil (CIALIS) 20 MG tablet Take 1 tablet (20 mg total) by mouth daily as needed for  erectile dysfunction.  . [DISCONTINUED] atorvastatin (LIPITOR) 80 MG tablet TAKE 1 TABLET (80 MG TOTAL) BY MOUTH DAILY AT 6 PM.  . [DISCONTINUED] clopidogrel (PLAVIX) 75 MG tablet TAKE 1 TABLET DAILY   No Known Allergies Past Medical History:  Diagnosis Date  . Anginal pain (HCC)   . Arthritis    "fingers" (04/22/2015)  . BPH (benign prostatic hypertrophy)   . CAD, NATIVE VESSEL, Hx. stent-RCA 1997; 2 Taxusprox & mid RCA 06/2003, cath 05/06/13 100% RCA, mid LAD disease 05/03/2009   Qualifier: Diagnosis of  By: Denyse Amass CMA, Carol    . Depression   . 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 DM2 (diabetes mellitus, type 2) (HCC)   .  Erectile dysfunction due to arterial insufficiency 10/10/2016  . Essential hypertension 08/19/2008  . HLD (hyperlipidemia)   . HTN (hypertension)   . Hypertriglyceridemia 05/03/2009       . Kidney stones   . MI (myocardial infarction) (HCC) 1997; ~ 2002; 04/2013  . 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 S/P coronary artery stent placement -mid LAD, 05/06/13 Promus Premier DES 05/07/2013   Family History  Problem Relation Age of Onset  . Heart disease Mother   . Hypertension Unknown   . Cancer Neg Hx   . Diabetes Neg Hx   . Early death Neg Hx   . Hyperlipidemia Neg Hx   . Kidney disease Neg Hx   . Stroke Neg Hx   . Alcohol abuse Neg Hx    Past Surgical History:  Procedure Laterality Date  . CARDIAC CATHETERIZATION N/A 04/26/2015   Procedure: Left Heart Cath and Coronary Angiography;  Surgeon: Kathleene Hazel, MD;  Location: [facility] INVASIVE CV LAB;  Service: Cardiovascular;  Laterality: N/A;  . CORONARY ANGIOPLASTY WITH STENT PLACEMENT  1997 - 04/2013   "got several stents; may 8 or 9"  . CORONARY ANGIOPLASTY WITH STENT PLACEMENT  05/06/2013   DES/LAD               DR Clifton James  . LEFT HEART CATHETERIZATION WITH CORONARY ANGIOGRAM N/A 05/06/2013   Procedure: LEFT HEART CATHETERIZATION WITH CORONARY ANGIOGRAM;  Surgeon: Kathleene Hazel, MD;  Location: [facility] CATH LAB;  Service: Cardiovascular;  Laterality: N/A;   Social History   Socioeconomic History  . Marital status: Married    Spouse name: Not on file  . Number of children: 2  . Years of education: Not on file  . Highest education level: Not on file  Occupational History  . Occupation: Air cabin crew  . Financial resource strain: Not on file  . Food insecurity:    Worry: Not on file    Inability: Not on file  . Transportation needs:    Medical: Not on file    Non-medical: Not on file  Tobacco Use  . Smoking status: Former Smoker    Packs/day: 2.75    Years: 22.00    Pack years: 60.50    Types: Cigarettes    Last  attempt to quit: 09/07/1996    Years since quitting: 21.3  . Smokeless tobacco: Never Used  Substance and Sexual Activity  . Alcohol use: Yes    Comment: 04/22/2015 "might drink a 6 pack of beer/year"  . Drug use: No  . Sexual activity: Not Currently  Lifestyle  . Physical activity:    Days per week: Not on file    Minutes per session: Not on file  . Stress: Not on file  Relationships  . Social connections:    Talks on phone: Not on file    Gets together: Not on file    Attends religious service: Not on file    Active member of club or organization: Not on file    Attends meetings of clubs or organizations: Not on file    Relationship status: Not on file  . Intimate partner violence:    Fear of current or ex partner: Not on file    Emotionally abused: Not on file    Physically abused: Not on file    Forced sexual activity: Not on file  Other Topics Concern  . Not on file  Social History Narrative  . Not on file     Review of Systems: General: negative for chills, fever, night sweats or weight changes.  Cardiovascular: negative for chest pain, dyspnea on exertion, edema, orthopnea, palpitations, paroxysmal nocturnal dyspnea or shortness of breath Dermatological: negative for rash Respiratory: negative for cough or wheezing Urologic: negative for hematuria Abdominal: negative for nausea, vomiting, diarrhea, bright red blood per rectum, melena, or hematemesis Neurologic: negative for visual changes, syncope, or dizziness All other systems reviewed and are otherwise negative except as noted above.   Physical Exam:  Blood pressure 126/80, pulse 69, height 5\' 7"  (1.702 m), weight 174 lb 12.8 oz (79.3 kg).  General appearance: alert, cooperative and no distress Neck: no carotid bruit and no JVD Lungs: clear to auscultation bilaterally Heart: regular rate and rhythm, S1, S2 normal, no murmur, click, rub or gallop Extremities: extremities normal, atraumatic, no cyanosis or  edema Pulses: 2+ and symmetric Skin: Skin color, texture, turgor normal. No rashes or lesions Neurologic: Grossly normal  EKG NSR w/ PVCs, old inferior Qwaves, new lateral Qwaves -- personally reviewed   ASSESSMENT AND PLAN:   1. CAD: History is outlined above in detail.  History of multiple PCI's.  Patient lost to follow-up and not seen since 2017 and notes several episodes of anterior neck discomfort which is his anginal equivalent.  Although symptoms have occurred, they are infrequent. Last episode was about 1 month ago. He works in [employer].  He denies any anterior chest pain and no dyspnea.  He is currently pain-free.  His EKG shows new lateral Q waves which were not seen on last EKG in 2017.  Inferior Q waves are old. ? If he has completed infarcted the small diagonal branch that had 90% stenosis in 2017 and treated medically (too small for PCI). This may explain his lateral Q waves. However I will obtain NST to rule out other areas of ischemia given his recent symptoms and history. For now, continue ASA and Plavix + statin. No BB due to resting HR in the 60s. He desires not to take nitrate due to Cialis use.   2. HLD: recent lipid panel showed elevated LDL at 108 mg/dL. He reports full compliance with Lipitor 80 mg. Goal LDL given CAD is <70 mg/dL. We will add Zetia for additional LDL reduction. Recheck FLP and HFTs in 8 weeks. If not at goal, will refer to Lipid clinic for consideration for PCSK9 inhibitor therapy.    Follow-Up after stress test.   Knute Neu, MHS [facility] 12/26/2017 4:47 PM

## 2017-12-30 ENCOUNTER — Telehealth (HOSPITAL_COMMUNITY): Payer: Self-pay | Admitting: *Deleted

## 2017-12-30 NOTE — Telephone Encounter (Signed)
Left message on voicemail in reference to upcoming appointment scheduled for 01/02/18. Phone number given for a call back so details instructions can be given.  Deion Swift Jacqueline   

## 2018-01-02 ENCOUNTER — Ambulatory Visit (HOSPITAL_COMMUNITY): Payer: 59 | Attending: Cardiovascular Disease

## 2018-01-02 DIAGNOSIS — E785 Hyperlipidemia, unspecified: Secondary | ICD-10-CM | POA: Diagnosis not present

## 2018-01-02 DIAGNOSIS — R9439 Abnormal result of other cardiovascular function study: Secondary | ICD-10-CM | POA: Insufficient documentation

## 2018-01-02 DIAGNOSIS — I2510 Atherosclerotic heart disease of native coronary artery without angina pectoris: Secondary | ICD-10-CM | POA: Diagnosis present

## 2018-01-02 DIAGNOSIS — E1151 Type 2 diabetes mellitus with diabetic peripheral angiopathy without gangrene: Secondary | ICD-10-CM

## 2018-01-02 DIAGNOSIS — I10 Essential (primary) hypertension: Secondary | ICD-10-CM | POA: Insufficient documentation

## 2018-01-02 DIAGNOSIS — E119 Type 2 diabetes mellitus without complications: Secondary | ICD-10-CM | POA: Diagnosis not present

## 2018-01-02 DIAGNOSIS — M542 Cervicalgia: Secondary | ICD-10-CM | POA: Diagnosis not present

## 2018-01-02 DIAGNOSIS — I70209 Unspecified atherosclerosis of native arteries of extremities, unspecified extremity: Secondary | ICD-10-CM

## 2018-01-02 LAB — MYOCARDIAL PERFUSION IMAGING
CHL CUP NUCLEAR SDS: 4
CHL CUP RESTING HR STRESS: 54 {beats}/min
LV dias vol: 122 mL (ref 62–150)
LVSYSVOL: 72 mL
NUC STRESS TID: 1.01
Peak HR: 114 {beats}/min
SRS: 12
SSS: 18

## 2018-01-02 MED ORDER — REGADENOSON 0.4 MG/5ML IV SOLN
0.4000 mg | Freq: Once | INTRAVENOUS | Status: AC
  Administered 2018-01-02: 0.4 mg via INTRAVENOUS

## 2018-01-02 MED ORDER — TECHNETIUM TC 99M TETROFOSMIN IV KIT
32.3000 | PACK | Freq: Once | INTRAVENOUS | Status: AC | PRN
  Administered 2018-01-02: 32.3 via INTRAVENOUS
  Filled 2018-01-02: qty 33

## 2018-01-02 MED ORDER — TECHNETIUM TC 99M TETROFOSMIN IV KIT
10.2000 | PACK | Freq: Once | INTRAVENOUS | Status: AC | PRN
  Administered 2018-01-02: 10.2 via INTRAVENOUS
  Filled 2018-01-02: qty 11

## 2018-01-07 ENCOUNTER — Ambulatory Visit: Payer: 59 | Admitting: Cardiology

## 2018-01-09 ENCOUNTER — Encounter: Payer: Self-pay | Admitting: Cardiology

## 2018-01-23 ENCOUNTER — Ambulatory Visit: Payer: 59 | Admitting: Cardiology

## 2018-02-25 ENCOUNTER — Other Ambulatory Visit: Payer: 59

## 2018-04-01 ENCOUNTER — Ambulatory Visit (INDEPENDENT_AMBULATORY_CARE_PROVIDER_SITE_OTHER): Payer: 59 | Admitting: Internal Medicine

## 2018-04-01 ENCOUNTER — Other Ambulatory Visit (INDEPENDENT_AMBULATORY_CARE_PROVIDER_SITE_OTHER): Payer: 59

## 2018-04-01 ENCOUNTER — Encounter: Payer: Self-pay | Admitting: Internal Medicine

## 2018-04-01 VITALS — BP 180/100 | HR 80 | Temp 98.0°F | Resp 16 | Ht 67.0 in | Wt 177.0 lb

## 2018-04-01 DIAGNOSIS — E559 Vitamin D deficiency, unspecified: Secondary | ICD-10-CM

## 2018-04-01 DIAGNOSIS — E1151 Type 2 diabetes mellitus with diabetic peripheral angiopathy without gangrene: Secondary | ICD-10-CM

## 2018-04-01 DIAGNOSIS — I2510 Atherosclerotic heart disease of native coronary artery without angina pectoris: Secondary | ICD-10-CM

## 2018-04-01 DIAGNOSIS — I70209 Unspecified atherosclerosis of native arteries of extremities, unspecified extremity: Secondary | ICD-10-CM

## 2018-04-01 DIAGNOSIS — I10 Essential (primary) hypertension: Secondary | ICD-10-CM | POA: Diagnosis not present

## 2018-04-01 DIAGNOSIS — E785 Hyperlipidemia, unspecified: Secondary | ICD-10-CM | POA: Diagnosis not present

## 2018-04-01 LAB — BASIC METABOLIC PANEL
BUN: 17 mg/dL (ref 6–23)
CO2: 31 mEq/L (ref 19–32)
Calcium: 9.5 mg/dL (ref 8.4–10.5)
Chloride: 103 mEq/L (ref 96–112)
Creatinine, Ser: 0.86 mg/dL (ref 0.40–1.50)
GFR: 96.47 mL/min (ref >60.00)
Glucose, Bld: 196 mg/dL — ABNORMAL HIGH (ref 70–99)
Potassium: 3.6 mEq/L (ref 3.5–5.1)
Sodium: 140 mEq/L (ref 135–145)

## 2018-04-01 LAB — POCT GLYCOSYLATED HEMOGLOBIN (HGB A1C): Hemoglobin A1C: 6.9 % — AB (ref 4.0–5.6)

## 2018-04-01 LAB — URINALYSIS, ROUTINE W REFLEX MICROSCOPIC
Bilirubin Urine: NEGATIVE
HGB URINE DIPSTICK: NEGATIVE
Ketones, ur: NEGATIVE
Leukocytes, UA: NEGATIVE
NITRITE: NEGATIVE
RBC / HPF: NONE SEEN (ref 0–?)
Specific Gravity, Urine: 1.015 (ref 1.000–1.030)
Total Protein, Urine: NEGATIVE
Urine Glucose: >=1000 — AB
Urobilinogen, UA: 0.2 (ref 0.0–1.0)
pH: 6.5 (ref 5.0–8.0)

## 2018-04-01 LAB — HEMOGLOBIN A1C: Hgb A1c MFr Bld: 6.9 — AB (ref 4.0–6.0)

## 2018-04-01 MED ORDER — ATORVASTATIN CALCIUM 80 MG PO TABS: ORAL_TABLET | ORAL | 1 refills | Status: DC

## 2018-04-01 MED ORDER — AZILSARTAN-CHLORTHALIDONE 40-25 MG PO TABS: 1.0000 | ORAL_TABLET | Freq: Every day | ORAL | 0 refills | Status: DC

## 2018-04-01 MED ORDER — SITAGLIP PHOS-METFORMIN HCL ER 100-1000 MG PO TB24: 1.0000 | ORAL_TABLET | Freq: Every day | ORAL | 1 refills | Status: DC

## 2018-04-01 MED ORDER — DAPAGLIFLOZIN PROPANEDIOL 10 MG PO TABS: 10.0000 mg | ORAL_TABLET | Freq: Every day | ORAL | 1 refills | Status: DC

## 2018-04-01 NOTE — Progress Notes (Signed)
Subjective:  Patient ID: Justin Galvan, male    DOB: 01/02/1961  Age: 57 y.o. MRN: [id number]  CC: Hypertension; Hyperlipidemia; and Diabetes   HPI Justin Galvan presents for f/up - For the last few months he has noticed an asymptomatic bulge in his abdomen.  He denies abdominal pain, nausea, vomiting, or loss of appetite.  He cannot tell me for sure whether or not he is taking his antihypertensive.  He thinks he is taking something but he thinks it twice a day.  However his medication list indicates that he should be taking Edarbyclor once a day.  He does not monitor his blood pressure but has had a few headaches recently.  He tells me his blood sugars have been relatively well controlled.  He denies polys.  He also does not think he has been taking his statin recently.  He is not sure why.  Outpatient Medications Prior to Visit  Medication Sig Dispense Refill  . clopidogrel (PLAVIX) 75 MG tablet Take 1 tablet (75 mg total) by mouth daily. 90 tablet 2  . diazepam (VALIUM) 2 MG tablet TAKE 1 TABLET (2 MG TOTAL) BY MOUTH EVERY 6 (SIX) HOURS AS NEEDED FOR ANXIETY. 30 tablet 2  . glucose blood test strip Test up to two times daily 100 each 12  . Icosapent Ethyl (VASCEPA) 1 g CAPS Take 2 capsules (2 g total) by mouth 2 (two) times daily. 360 capsule 1  . PARoxetine (PAXIL) 40 MG tablet TAKE 1 TABLET BY MOUTH DAILY. ANNUAL APPT DUE IN JUNE MUST SEE PROVIDER FOR FUTURE REFILLS 90 tablet 1  . tadalafil (CIALIS) 20 MG tablet Take 1 tablet (20 mg total) by mouth daily as needed for erectile dysfunction. 8 tablet 11  . aspirin EC 81 MG tablet Take 1 tablet (81 mg total) by mouth daily. 90 tablet 1  . atorvastatin (LIPITOR) 80 MG tablet TAKE 1 TABLET (80 MG TOTAL) BY MOUTH DAILY AT 6 PM. 90 tablet 2  . Azilsartan-Chlorthalidone (EDARBYCLOR) 40-12.5 MG TABS Take 1 tablet by mouth daily. 90 tablet 1  . dapagliflozin propanediol (FARXIGA) 5 MG TABS tablet Take 5 mg by mouth daily. 90 tablet 1  .  SitaGLIPtin-MetFORMIN HCl (JANUMET XR) [phone] MG TB24 Take 1 tablet by mouth daily. 90 tablet 1  . ezetimibe (ZETIA) 10 MG tablet Take 1 tablet (10 mg total) by mouth daily. 90 tablet 3   No facility-administered medications prior to visit.     ROS Review of Systems  Constitutional: Negative.  Negative for appetite change, diaphoresis and fatigue.  HENT: Negative.   Eyes: Negative for visual disturbance.  Respiratory: Negative for cough, chest tightness, shortness of breath and wheezing.   Cardiovascular: Negative for chest pain, palpitations and leg swelling.  Gastrointestinal: Negative for abdominal pain, diarrhea, nausea and vomiting.  Endocrine: Negative.   Genitourinary: Negative.  Negative for difficulty urinating.  Musculoskeletal: Negative.  Negative for arthralgias, back pain, myalgias and neck pain.  Skin: Negative.  Negative for color change.  Neurological: Positive for headaches. Negative for dizziness, weakness, light-headedness and numbness.  Hematological: Negative for adenopathy. Does not bruise/bleed easily.  Psychiatric/Behavioral: Negative.     Objective:  BP (!) 180/100 (BP Location: Left Arm, Patient Position: Sitting, Cuff Size: Normal)   Pulse 80   Temp 98 F (36.7 C) (Oral)   Resp 16   Ht 5\' 7"  (1.702 m)   Wt 177 lb (80.3 kg)   SpO2 96%   BMI 27.72 kg/m  BP Readings from Last 3 Encounters:  04/01/18 (!) 180/100  12/26/17 126/80  10/15/17 (!) 148/92    Wt Readings from Last 3 Encounters:  04/01/18 177 lb (80.3 kg)  01/02/18 174 lb (78.9 kg)  12/26/17 174 lb 12.8 oz (79.3 kg)    Physical Exam  Constitutional: He is oriented to person, place, and time. No distress.  HENT:  Mouth/Throat: Oropharynx is clear and moist. No oropharyngeal exudate.  Eyes: Conjunctivae are normal. No scleral icterus.  Neck: Normal range of motion. Neck supple. No JVD present. No thyromegaly present.  Cardiovascular: Normal rate, regular rhythm and normal heart  sounds. Exam reveals no gallop.  No murmur heard. Pulmonary/Chest: Effort normal and breath sounds normal. No respiratory distress. He has no wheezes. He has no rales.  Abdominal: Soft. Normal appearance and bowel sounds are normal. He exhibits no distension, no abdominal bruit, no ascites and no mass. There is no hepatosplenomegaly. There is no tenderness. A hernia is present.  He has a small uncomplicated ventral hernia just above the abdomen that is only noted with Valsalva maneuver.  Musculoskeletal: Normal range of motion. He exhibits no edema, tenderness or deformity.  Lymphadenopathy:    He has no cervical adenopathy.  Neurological: He is alert and oriented to person, place, and time.  Skin: Skin is warm and dry. No rash noted. He is not diaphoretic.  Vitals reviewed.   Lab Results  Component Value Date   WBC 7.8 10/15/2017   HGB 14.5 10/15/2017   HCT 41.9 10/15/2017   PLT 241.0 10/15/2017   GLUCOSE 196 (H) 04/01/2018   CHOL 177 10/15/2017   TRIG 209.0 (H) 10/15/2017   HDL 38.50 (L) 10/15/2017   LDLDIRECT 108.0 10/15/2017   LDLCALC 73 04/11/2016   ALT 19 10/15/2017   AST 16 10/15/2017   NA 140 04/01/2018   K 3.6 04/01/2018   CL 103 04/01/2018   CREATININE 0.86 04/01/2018   BUN 17 04/01/2018   CO2 31 04/01/2018   TSH 2.76 10/15/2017   PSA 1.87 10/15/2017   INR 1.04 04/23/2015   HGBA1C 6.9 (A) 04/01/2018   MICROALBUR 0.8 10/15/2017    Ct Maxillofacial Limited Wo Contrast  Result Date: 10/17/2016 CLINICAL DATA:  Initial evaluation for intermittent recurrent sinusitis for 1 year. EXAM: CT PARANASAL SINUS LIMITED WITHOUT CONTRAST TECHNIQUE: Non-contiguous multidetector CT images of the paranasal sinuses were obtained in a single plane without contrast. COMPARISON:  None. FINDINGS: Limited coronal images through the paranasal sinuses were performed. Partially visualized frontal sinuses are clear. Visualized ethmoidal air cells well pneumatized and clear. Sphenoid sinuses  clear bilaterally. Visualized maxillary sinuses are well pneumatized and clear. Ostiomeatal units grossly patent. Nasal cavities grossly clear. Minimal right-to-left bowing of the nasal septum noted. No evidence for acute or chronic sinusitis identified on this limited exam. IMPRESSION: Clear sinuses. No imaging findings to suggest acute or chronic sinusitis identified. Electronically Signed   By: Rise Mu M.D.   On: 10/17/2016 16:09    Assessment & Plan:   Searcy was seen today for hypertension, hyperlipidemia and diabetes.  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  His blood sugars are adequately well controlled.  I will advance him to max dose of FARXIGA so that he can get the CV risk reduction.  Will continue metformin and the DPP 4 inhibitor at the current doses. -     POCT glycosylated hemoglobin (Hb  A1C) -     SitaGLIPtin-MetFORMIN HCl (JANUMET XR) [phone] MG TB24; Take 1 tablet by mouth daily. -     dapagliflozin propanediol (FARXIGA) 10 MG TABS tablet; Take 10 mg by mouth daily. -     Azilsartan-Chlorthalidone (EDARBYCLOR) 40-25 MG TABS; Take 1 tablet by mouth daily. -     Ambulatory referral to Ophthalmology -     Basic metabolic panel; Future  Atherosclerosis of native coronary artery of native heart without angina pectoris- He has had no angina recently.  Will try to improve his risk factor modifications. -     atorvastatin (LIPITOR) 80 MG tablet; TAKE 1 TABLET (80 MG TOTAL) BY MOUTH DAILY AT 6 PM. -     Azilsartan-Chlorthalidone (EDARBYCLOR) 40-25 MG TABS; Take 1 tablet by mouth daily.  Essential hypertension- His blood pressure is not adequately well controlled.  It sounds like he is not compliant with the Edarbyclor.  He is symptomatic with a headache so I gave him samples of Edarbyclor today and asked him to start taking it again.  I will also treat the vitamin D  deficiency.  Otherwise his labs are negative for secondary causes or endorgan damage. -     Azilsartan-Chlorthalidone (EDARBYCLOR) 40-25 MG TABS; Take 1 tablet by mouth daily. -     Basic metabolic panel; Future -     VITAMIN D 25 Hydroxy (Vit-D Deficiency, Fractures); Future -     Urinalysis, Routine w reflex microscopic; Future -     Cancel: Urine drugs of abuse scrn w alc, routine (Ref Lab); Future -     Urine drugs of abuse scrn w alc, routine (Ref Lab); Future  Hyperlipidemia with target LDL less than 70 -     atorvastatin (LIPITOR) 80 MG tablet; TAKE 1 TABLET (80 MG TOTAL) BY MOUTH DAILY AT 6 PM.  Vitamin D deficiency disease -     Cholecalciferol 1.25 MG (50000 UT) capsule; Take 1 capsule (50,000 Units total) by mouth once a week.   I have discontinued Kayleen MemosMichael C. Malson's Azilsartan-Chlorthalidone, dapagliflozin propanediol, and ezetimibe. I am also having him start on dapagliflozin propanediol, Azilsartan-Chlorthalidone, and Cholecalciferol. Additionally, I am having him maintain his glucose blood, tadalafil, Icosapent Ethyl, diazepam, PARoxetine, clopidogrel, SitaGLIPtin-MetFORMIN HCl, and atorvastatin.  Meds ordered this encounter  Medications  . SitaGLIPtin-MetFORMIN HCl (JANUMET XR) [phone] MG TB24    Sig: Take 1 tablet by mouth daily.    Dispense:  90 tablet    Refill:  1  . dapagliflozin propanediol (FARXIGA) 10 MG TABS tablet    Sig: Take 10 mg by mouth daily.    Dispense:  90 tablet    Refill:  1  . atorvastatin (LIPITOR) 80 MG tablet    Sig: TAKE 1 TABLET (80 MG TOTAL) BY MOUTH DAILY AT 6 PM.    Dispense:  90 tablet    Refill:  1  . Azilsartan-Chlorthalidone (EDARBYCLOR) 40-25 MG TABS    Sig: Take 1 tablet by mouth daily.    Dispense:  56 tablet    Refill:  0  . Cholecalciferol 1.25 MG (50000 UT) capsule    Sig: Take 1 capsule (50,000 Units total) by mouth once a week.    Dispense:  12 capsule    Refill:  1     Follow-up: Return in about 4 weeks (around  04/29/2018).  Sanda Lingerhomas Jones, MD

## 2018-04-01 NOTE — Patient Instructions (Signed)

## 2018-04-02 ENCOUNTER — Other Ambulatory Visit: Payer: Self-pay | Admitting: Internal Medicine

## 2018-04-02 ENCOUNTER — Encounter: Payer: Self-pay | Admitting: Internal Medicine

## 2018-04-02 DIAGNOSIS — I2510 Atherosclerotic heart disease of native coronary artery without angina pectoris: Secondary | ICD-10-CM

## 2018-04-02 DIAGNOSIS — E559 Vitamin D deficiency, unspecified: Secondary | ICD-10-CM | POA: Insufficient documentation

## 2018-04-02 LAB — URINE DRUGS OF ABUSE SCREEN W ALC, ROUTINE (REF LAB)
Amphetamines, Urine: NEGATIVE ng/mL
BARBITURATE QUANT UR: NEGATIVE ng/mL
Benzodiazepine Quant, Ur: NEGATIVE ng/mL
Cannabinoid Quant, Ur: NEGATIVE ng/mL
Cocaine (Metab.): NEGATIVE ng/mL
ETHANOL, URINE: NEGATIVE %
METHADONE SCREEN, URINE: NEGATIVE ng/mL
Opiate Quant, Ur: NEGATIVE ng/mL
PCP Quant, Ur: NEGATIVE ng/mL
Propoxyphene: NEGATIVE ng/mL

## 2018-04-02 LAB — VITAMIN D 25 HYDROXY (VIT D DEFICIENCY, FRACTURES): VITD: 18.4 ng/mL — ABNORMAL LOW (ref 30.00–100.00)

## 2018-04-02 MED ORDER — CHOLECALCIFEROL 1.25 MG (50000 UT) PO CAPS: 50000.0000 [IU] | ORAL_CAPSULE | ORAL | 1 refills | Status: DC

## 2018-04-10 ENCOUNTER — Other Ambulatory Visit: Payer: Self-pay | Admitting: Internal Medicine

## 2018-04-10 DIAGNOSIS — N5201 Erectile dysfunction due to arterial insufficiency: Secondary | ICD-10-CM

## 2018-04-10 NOTE — Telephone Encounter (Signed)
Copied from CRM [id number]. Topic: Quick Communication - Rx Refill/Question >> Apr 10, 2018 10:47 AM Floria RavelingStovall, Shana A wrote: Medication: tadalafil (CIALIS) 20 MG tablet [[id number]][[id number]] - pt was seen last week and stated he meant to ask for a refill for this med - stated they have meet their out of pocket now   Has the patient contacted their pharmacy? No  (Agent: If no, request that the patient contact the pharmacy for the refill.) (Agent: If yes, when and what did the pharmacy advise?)  Preferred Pharmacy (with phone number or street name): [id number] - [place], [place] - [address] [phone] (Phone)   Agent: Please be advised that RX refills may take up to 3 business days. We ask that you follow-up with your pharmacy.

## 2018-04-10 NOTE — Telephone Encounter (Signed)
Requested medication (s) are due for refill today: yes  Requested medication (s) are on the active medication list: yes  Last refill:  10/10/16 #8 11 RF  Future visit scheduled: no- called pt to make f/u in January per last OV notes  Notes to clinic:  Last BP 180/100   Requested Prescriptions  Pending Prescriptions Disp Refills   tadalafil (CIALIS) 20 MG tablet 8 tablet 11    Sig: Take 1 tablet (20 mg total) by mouth daily as needed for erectile dysfunction.     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

## 2018-04-14 ENCOUNTER — Other Ambulatory Visit: Payer: Self-pay | Admitting: Internal Medicine

## 2018-04-14 DIAGNOSIS — F418 Other specified anxiety disorders: Secondary | ICD-10-CM

## 2018-04-14 NOTE — Telephone Encounter (Signed)
Pt is due for follow up in first few weeks of January. I have sent in a 30 day supply but pt will need to follow up for refills.

## 2018-04-14 NOTE — Telephone Encounter (Signed)
LVM for patient to schedule appt.

## 2018-04-17 MED ORDER — TADALAFIL 20 MG PO TABS: 20.0000 mg | ORAL_TABLET | Freq: Every day | ORAL | 0 refills | Status: DC | PRN

## 2018-05-14 ENCOUNTER — Other Ambulatory Visit: Payer: Self-pay | Admitting: Internal Medicine

## 2018-05-14 DIAGNOSIS — F418 Other specified anxiety disorders: Secondary | ICD-10-CM

## 2018-06-10 ENCOUNTER — Encounter: Payer: Self-pay | Admitting: Internal Medicine

## 2018-06-10 ENCOUNTER — Other Ambulatory Visit (INDEPENDENT_AMBULATORY_CARE_PROVIDER_SITE_OTHER): Payer: 59

## 2018-06-10 ENCOUNTER — Ambulatory Visit (INDEPENDENT_AMBULATORY_CARE_PROVIDER_SITE_OTHER): Payer: 59 | Admitting: Internal Medicine

## 2018-06-10 VITALS — BP 142/86 | HR 74 | Temp 97.7°F | Resp 16 | Ht 67.0 in | Wt 174.2 lb

## 2018-06-10 DIAGNOSIS — E781 Pure hyperglyceridemia: Secondary | ICD-10-CM

## 2018-06-10 DIAGNOSIS — I70209 Unspecified atherosclerosis of native arteries of extremities, unspecified extremity: Secondary | ICD-10-CM | POA: Diagnosis not present

## 2018-06-10 DIAGNOSIS — I10 Essential (primary) hypertension: Secondary | ICD-10-CM | POA: Diagnosis not present

## 2018-06-10 DIAGNOSIS — E1151 Type 2 diabetes mellitus with diabetic peripheral angiopathy without gangrene: Secondary | ICD-10-CM | POA: Diagnosis not present

## 2018-06-10 LAB — BASIC METABOLIC PANEL
BUN: 19 mg/dL (ref 6–23)
CO2: 28 mEq/L (ref 19–32)
Calcium: 9.5 mg/dL (ref 8.4–10.5)
Chloride: 97 mEq/L (ref 96–112)
Creatinine, Ser: 0.9 mg/dL (ref 0.40–1.50)
GFR: 86.07 mL/min (ref >60.00)
Glucose, Bld: 141 mg/dL — ABNORMAL HIGH (ref 70–99)
Potassium: 3.8 mEq/L (ref 3.5–5.1)
Sodium: 135 mEq/L (ref 135–145)

## 2018-06-10 LAB — HEMOGLOBIN A1C: Hgb A1c MFr Bld: 7.8 % — ABNORMAL HIGH (ref 4.6–6.5)

## 2018-06-10 LAB — TRIGLYCERIDES: Triglycerides: 267 mg/dL — ABNORMAL HIGH (ref 0.0–149.0)

## 2018-06-10 NOTE — Patient Instructions (Signed)

## 2018-06-10 NOTE — Progress Notes (Signed)
Subjective:  Patient ID: Justin Galvan, male    DOB: Dec 16, 1960  Age: 58 y.o. MRN: [id number]  CC: Hypertension and Diabetes   HPI RALIK HERRIG presents for f/up - He feels well today and offers no complaints.  He has been working on his lifestyle modification and denies any recent episodes of CP or DOE with exercise.  He thinks he is compliant with most of his meds but he is not sure.  Outpatient Medications Prior to Visit  Medication Sig Dispense Refill  . aspirin 81 MG EC tablet TAKE 1 TABLET BY MOUTH EVERY DAY 90 tablet 1  . Azilsartan-Chlorthalidone (EDARBYCLOR) 40-25 MG TABS Take 1 tablet by mouth daily. 56 tablet 0  . Cholecalciferol 1.25 MG (50000 UT) capsule Take 1 capsule (50,000 Units total) by mouth once a week. 12 capsule 1  . clopidogrel (PLAVIX) 75 MG tablet Take 1 tablet (75 mg total) by mouth daily. 90 tablet 2  . dapagliflozin propanediol (FARXIGA) 10 MG TABS tablet Take 10 mg by mouth daily. 90 tablet 1  . diazepam (VALIUM) 2 MG tablet TAKE 1 TABLET (2 MG TOTAL) BY MOUTH EVERY 6 (SIX) HOURS AS NEEDED FOR ANXIETY. 30 tablet 2  . glucose blood test strip Test up to two times daily 100 each 12  . PARoxetine (PAXIL) 40 MG tablet TAKE 1 TABLET BY MOUTH EVERY DAY 90 tablet 1  . SitaGLIPtin-MetFORMIN HCl (JANUMET XR) [phone] MG TB24 Take 1 tablet by mouth daily. 90 tablet 1  . atorvastatin (LIPITOR) 80 MG tablet TAKE 1 TABLET (80 MG TOTAL) BY MOUTH DAILY AT 6 PM. 90 tablet 1  . Icosapent Ethyl (VASCEPA) 1 g CAPS Take 2 capsules (2 g total) by mouth 2 (two) times daily. 360 capsule 1  . tadalafil (CIALIS) 20 MG tablet Take 1 tablet (20 mg total) by mouth daily as needed for erectile dysfunction. (Patient not taking: Reported on 06/10/2018) 8 tablet 0   No facility-administered medications prior to visit.     ROS Review of Systems  Constitutional: Negative for diaphoresis, fatigue and unexpected weight change.  HENT: Negative.   Eyes: Negative for visual disturbance.    Respiratory: Negative for cough, chest tightness, shortness of breath and wheezing.   Cardiovascular: Negative for chest pain, palpitations and leg swelling.  Gastrointestinal: Negative for abdominal pain, constipation, diarrhea, nausea and vomiting.  Endocrine: Negative for polydipsia, polyphagia and polyuria.  Genitourinary: Negative.  Negative for difficulty urinating, dysuria and urgency.  Musculoskeletal: Negative.  Negative for arthralgias, back pain, myalgias and neck pain.  Skin: Negative.  Negative for color change, pallor and rash.  Neurological: Negative for dizziness and weakness.  Hematological: Negative for adenopathy. Does not bruise/bleed easily.  Psychiatric/Behavioral: Negative.     Objective:  BP (!) 142/86 (BP Location: Left Arm, Patient Position: Sitting, Cuff Size: Normal)   Pulse 74   Temp 97.7 F (36.5 C) (Oral)   Resp 16   Ht 5\' 7"  (1.702 m)   Wt 174 lb 4 oz (79 kg)   SpO2 98%   BMI 27.29 kg/m   BP Readings from Last 3 Encounters:  06/10/18 (!) 142/86  04/01/18 (!) 180/100  12/26/17 126/80    Wt Readings from Last 3 Encounters:  06/10/18 174 lb 4 oz (79 kg)  04/01/18 177 lb (80.3 kg)  01/02/18 174 lb (78.9 kg)    Physical Exam Vitals signs reviewed.  Constitutional:      Appearance: He is not ill-appearing or diaphoretic.  HENT:  Nose: Nose normal. No congestion or rhinorrhea.     Mouth/Throat:     Mouth: Mucous membranes are moist.     Pharynx: Oropharynx is clear. No oropharyngeal exudate or posterior oropharyngeal erythema.  Eyes:     General: No scleral icterus.    Conjunctiva/sclera: Conjunctivae normal.  Neck:     Musculoskeletal: Normal range of motion and neck supple. No muscular tenderness.  Cardiovascular:     Rate and Rhythm: Normal rate and regular rhythm.     Pulses: Normal pulses.     Heart sounds: No murmur. No gallop.   Pulmonary:     Effort: Pulmonary effort is normal. No respiratory distress.     Breath sounds: No  stridor. No wheezing, rhonchi or rales.  Abdominal:     General: Bowel sounds are normal.     Palpations: There is no hepatomegaly, splenomegaly or mass.     Tenderness: There is no abdominal tenderness. There is no guarding.  Musculoskeletal: Normal range of motion.        General: No swelling.     Right lower leg: No edema.     Left lower leg: No edema.  Lymphadenopathy:     Cervical: No cervical adenopathy.  Skin:    General: Skin is warm and dry.     Coloration: Skin is not pale.     Findings: No erythema or rash.  Neurological:     General: No focal deficit present.     Mental Status: He is oriented to person, place, and time. Mental status is at baseline.     Lab Results  Component Value Date   WBC 7.8 10/15/2017   HGB 14.5 10/15/2017   HCT 41.9 10/15/2017   PLT 241.0 10/15/2017   GLUCOSE 141 (H) 06/10/2018   CHOL 177 10/15/2017   TRIG 267.0 (H) 06/10/2018   HDL 38.50 (L) 10/15/2017   LDLDIRECT 108.0 10/15/2017   LDLCALC 73 04/11/2016   ALT 19 10/15/2017   AST 16 10/15/2017   NA 135 06/10/2018   K 3.8 06/10/2018   CL 97 06/10/2018   CREATININE 0.90 06/10/2018   BUN 19 06/10/2018   CO2 28 06/10/2018   TSH 2.76 10/15/2017   PSA 1.87 10/15/2017   INR 1.04 04/23/2015   HGBA1C 7.8 (H) 06/10/2018   MICROALBUR 0.8 10/15/2017    Ct Maxillofacial Limited Wo Contrast  Result Date: 10/17/2016 CLINICAL DATA:  Initial evaluation for intermittent recurrent sinusitis for 1 year. EXAM: CT PARANASAL SINUS LIMITED WITHOUT CONTRAST TECHNIQUE: Non-contiguous multidetector CT images of the paranasal sinuses were obtained in a single plane without contrast. COMPARISON:  None. FINDINGS: Limited coronal images through the paranasal sinuses were performed. Partially visualized frontal sinuses are clear. Visualized ethmoidal air cells well pneumatized and clear. Sphenoid sinuses clear bilaterally. Visualized maxillary sinuses are well pneumatized and clear. Ostiomeatal units grossly  patent. Nasal cavities grossly clear. Minimal right-to-left bowing of the nasal septum noted. No evidence for acute or chronic sinusitis identified on this limited exam. IMPRESSION: Clear sinuses. No imaging findings to suggest acute or chronic sinusitis identified. Electronically Signed   By: Rise MuBenjamin  McClintock M.D.   On: 10/17/2016 16:09    Assessment & Plan:   Casimiro NeedleMichael was seen today for hypertension and diabetes.  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  I have asked him to be certain that he is compliant with the 3 hypoglycemic agents  and to improve his lifestyle modifications. -     Basic metabolic panel; Future -     Hemoglobin A1c; Future  Hypertriglyceridemia- His triglycerides are mildly elevated.  I have asked him to be certain that he is compliant with Vascepa and to decrease his intake of fat and carbohydrates. -     Triglycerides; Future  Essential hypertension- His blood pressure is not quite adequately well controlled.  I have asked him to improve his lifestyle modifications.  For now, will continue the current combination of an ARB and thiazide diuretic. -     Basic metabolic panel; Future   I am having Nickolos Loughran. Henault maintain his glucose blood, Icosapent Ethyl, diazepam, clopidogrel, SitaGLIPtin-MetFORMIN HCl, dapagliflozin propanediol, atorvastatin, Azilsartan-Chlorthalidone, aspirin, Cholecalciferol, tadalafil, and PARoxetine.  No orders of the defined types were placed in this encounter.    Follow-up: Return in about 4 months (around 10/09/2018).  Sanda Linger, MD

## 2018-06-11 ENCOUNTER — Encounter: Payer: Self-pay | Admitting: Internal Medicine

## 2018-09-18 ENCOUNTER — Telehealth: Payer: Self-pay

## 2018-09-18 NOTE — Telephone Encounter (Addendum)
Key: [id number] (Edarbyclor 40-12.5 mg tablets).   PA has been approved.   lvm for pt informing of same.

## 2018-10-02 ENCOUNTER — Other Ambulatory Visit: Payer: Self-pay | Admitting: Internal Medicine

## 2018-10-02 DIAGNOSIS — E559 Vitamin D deficiency, unspecified: Secondary | ICD-10-CM

## 2018-10-06 ENCOUNTER — Other Ambulatory Visit: Payer: Self-pay | Admitting: Internal Medicine

## 2018-10-06 DIAGNOSIS — E1151 Type 2 diabetes mellitus with diabetic peripheral angiopathy without gangrene: Secondary | ICD-10-CM

## 2018-10-08 ENCOUNTER — Other Ambulatory Visit: Payer: Self-pay | Admitting: Internal Medicine

## 2018-10-08 DIAGNOSIS — I2510 Atherosclerotic heart disease of native coronary artery without angina pectoris: Secondary | ICD-10-CM

## 2018-11-02 ENCOUNTER — Other Ambulatory Visit: Payer: Self-pay | Admitting: Internal Medicine

## 2018-11-02 DIAGNOSIS — I10 Essential (primary) hypertension: Secondary | ICD-10-CM

## 2018-11-02 DIAGNOSIS — E1151 Type 2 diabetes mellitus with diabetic peripheral angiopathy without gangrene: Secondary | ICD-10-CM

## 2018-11-02 DIAGNOSIS — I2510 Atherosclerotic heart disease of native coronary artery without angina pectoris: Secondary | ICD-10-CM

## 2018-11-03 ENCOUNTER — Other Ambulatory Visit: Payer: Self-pay | Admitting: Internal Medicine

## 2018-11-23 ENCOUNTER — Other Ambulatory Visit: Payer: Self-pay | Admitting: Internal Medicine

## 2018-11-23 DIAGNOSIS — E1151 Type 2 diabetes mellitus with diabetic peripheral angiopathy without gangrene: Secondary | ICD-10-CM

## 2018-12-01 ENCOUNTER — Other Ambulatory Visit: Payer: Self-pay | Admitting: Internal Medicine

## 2018-12-01 DIAGNOSIS — E1151 Type 2 diabetes mellitus with diabetic peripheral angiopathy without gangrene: Secondary | ICD-10-CM

## 2018-12-01 DIAGNOSIS — I10 Essential (primary) hypertension: Secondary | ICD-10-CM

## 2018-12-01 DIAGNOSIS — I2510 Atherosclerotic heart disease of native coronary artery without angina pectoris: Secondary | ICD-10-CM

## 2018-12-02 ENCOUNTER — Other Ambulatory Visit: Payer: Self-pay | Admitting: Internal Medicine

## 2018-12-02 DIAGNOSIS — E1151 Type 2 diabetes mellitus with diabetic peripheral angiopathy without gangrene: Secondary | ICD-10-CM

## 2018-12-02 DIAGNOSIS — I70209 Unspecified atherosclerosis of native arteries of extremities, unspecified extremity: Secondary | ICD-10-CM

## 2018-12-02 DIAGNOSIS — I2510 Atherosclerotic heart disease of native coronary artery without angina pectoris: Secondary | ICD-10-CM

## 2018-12-02 DIAGNOSIS — I10 Essential (primary) hypertension: Secondary | ICD-10-CM

## 2018-12-02 DIAGNOSIS — F418 Other specified anxiety disorders: Secondary | ICD-10-CM

## 2018-12-31 ENCOUNTER — Other Ambulatory Visit: Payer: Self-pay | Admitting: Internal Medicine

## 2018-12-31 DIAGNOSIS — E1151 Type 2 diabetes mellitus with diabetic peripheral angiopathy without gangrene: Secondary | ICD-10-CM

## 2018-12-31 DIAGNOSIS — I70209 Unspecified atherosclerosis of native arteries of extremities, unspecified extremity: Secondary | ICD-10-CM

## 2019-01-27 ENCOUNTER — Other Ambulatory Visit (INDEPENDENT_AMBULATORY_CARE_PROVIDER_SITE_OTHER): Payer: 59

## 2019-01-27 ENCOUNTER — Telehealth: Payer: Self-pay

## 2019-01-27 ENCOUNTER — Encounter: Payer: Self-pay | Admitting: Internal Medicine

## 2019-01-27 ENCOUNTER — Ambulatory Visit (INDEPENDENT_AMBULATORY_CARE_PROVIDER_SITE_OTHER): Payer: 59 | Admitting: Internal Medicine

## 2019-01-27 ENCOUNTER — Other Ambulatory Visit: Payer: Self-pay

## 2019-01-27 VITALS — BP 186/106 | HR 77 | Temp 98.1°F | Resp 16 | Ht 67.0 in | Wt 176.0 lb

## 2019-01-27 DIAGNOSIS — Z0000 Encounter for general adult medical examination without abnormal findings: Secondary | ICD-10-CM

## 2019-01-27 DIAGNOSIS — I10 Essential (primary) hypertension: Secondary | ICD-10-CM

## 2019-01-27 DIAGNOSIS — I2510 Atherosclerotic heart disease of native coronary artery without angina pectoris: Secondary | ICD-10-CM

## 2019-01-27 DIAGNOSIS — E1151 Type 2 diabetes mellitus with diabetic peripheral angiopathy without gangrene: Secondary | ICD-10-CM

## 2019-01-27 DIAGNOSIS — Z23 Encounter for immunization: Secondary | ICD-10-CM | POA: Diagnosis not present

## 2019-01-27 DIAGNOSIS — E785 Hyperlipidemia, unspecified: Secondary | ICD-10-CM

## 2019-01-27 DIAGNOSIS — E559 Vitamin D deficiency, unspecified: Secondary | ICD-10-CM

## 2019-01-27 DIAGNOSIS — E781 Pure hyperglyceridemia: Secondary | ICD-10-CM

## 2019-01-27 DIAGNOSIS — I70209 Unspecified atherosclerosis of native arteries of extremities, unspecified extremity: Secondary | ICD-10-CM

## 2019-01-27 DIAGNOSIS — Z0001 Encounter for general adult medical examination with abnormal findings: Secondary | ICD-10-CM | POA: Diagnosis not present

## 2019-01-27 LAB — URINALYSIS, ROUTINE W REFLEX MICROSCOPIC
Bilirubin Urine: NEGATIVE
Hgb urine dipstick: NEGATIVE
Ketones, ur: NEGATIVE
Leukocytes,Ua: NEGATIVE
Nitrite: NEGATIVE
RBC / HPF: NONE SEEN (ref 0–?)
Specific Gravity, Urine: 1.02 (ref 1.000–1.030)
Total Protein, Urine: NEGATIVE
Urine Glucose: NEGATIVE
Urobilinogen, UA: 0.2 (ref 0.0–1.0)
pH: 6.5 (ref 5.0–8.0)

## 2019-01-27 LAB — VITAMIN D 25 HYDROXY (VIT D DEFICIENCY, FRACTURES): VITD: 42.53 ng/mL (ref 30.00–100.00)

## 2019-01-27 LAB — CBC WITH DIFFERENTIAL/PLATELET
Basophils Absolute: 0.1 10*3/uL (ref 0.0–0.1)
Basophils Relative: 0.9 % (ref 0.0–3.0)
Eosinophils Absolute: 0.2 10*3/uL (ref 0.0–0.7)
Eosinophils Relative: 2.5 % (ref 0.0–5.0)
HCT: 42.1 % (ref 39.0–52.0)
Hemoglobin: 14.3 g/dL (ref 13.0–17.0)
Lymphocytes Relative: 24.1 % (ref 12.0–46.0)
Lymphs Abs: 1.9 10*3/uL (ref 0.7–4.0)
MCHC: 33.9 g/dL (ref 30.0–36.0)
MCV: 86 fl (ref 78.0–100.0)
Monocytes Absolute: 0.8 10*3/uL (ref 0.1–1.0)
Monocytes Relative: 10 % (ref 3.0–12.0)
Neutro Abs: 4.9 10*3/uL (ref 1.4–7.7)
Neutrophils Relative %: 62.5 % (ref 43.0–77.0)
Platelets: 270 10*3/uL (ref 150.0–400.0)
RBC: 4.89 Mil/uL (ref 4.22–5.81)
RDW: 13.6 % (ref 11.5–15.5)
WBC: 7.8 10*3/uL (ref 4.0–10.5)

## 2019-01-27 LAB — BASIC METABOLIC PANEL
BUN: 19 mg/dL (ref 6–23)
CO2: 25 mEq/L (ref 19–32)
Calcium: 9.7 mg/dL (ref 8.4–10.5)
Chloride: 102 mEq/L (ref 96–112)
Creatinine, Ser: 0.9 mg/dL (ref 0.40–1.50)
GFR: 85.89 mL/min (ref >60.00)
Glucose, Bld: 144 mg/dL — ABNORMAL HIGH (ref 70–99)
Potassium: 4.3 mEq/L (ref 3.5–5.1)
Sodium: 139 mEq/L (ref 135–145)

## 2019-01-27 LAB — HEPATIC FUNCTION PANEL
ALT: 19 U/L (ref 0–53)
AST: 16 U/L (ref 0–37)
Albumin: 4.6 g/dL (ref 3.5–5.2)
Alkaline Phosphatase: 62 U/L (ref 39–117)
Bilirubin, Direct: 0.1 mg/dL (ref 0.0–0.3)
Total Bilirubin: 0.6 mg/dL (ref 0.2–1.2)
Total Protein: 6.9 g/dL (ref 6.0–8.3)

## 2019-01-27 LAB — LIPID PANEL
Cholesterol: 131 mg/dL (ref 0–200)
HDL: 38.8 mg/dL — ABNORMAL LOW (ref >39.00)
LDL Cholesterol: 67 mg/dL (ref 0–99)
NonHDL: 92.21
Total CHOL/HDL Ratio: 3
Triglycerides: 126 mg/dL (ref 0.0–149.0)
VLDL: 25.2 mg/dL (ref 0.0–40.0)

## 2019-01-27 LAB — MICROALBUMIN / CREATININE URINE RATIO
Creatinine,U: 67.9 mg/dL
Microalb Creat Ratio: 1.4 mg/g (ref 0.0–30.0)
Microalb, Ur: 0.9 mg/dL (ref 0.0–1.9)

## 2019-01-27 LAB — POCT GLYCOSYLATED HEMOGLOBIN (HGB A1C): Hemoglobin A1C: 7.4 % — AB (ref 4.0–5.6)

## 2019-01-27 LAB — TROPONIN I (HIGH SENSITIVITY): High Sens Troponin I: 7 ng/L (ref 2–17)

## 2019-01-27 LAB — TSH: TSH: 2.81 u[IU]/mL (ref 0.35–4.50)

## 2019-01-27 LAB — PSA: PSA: 1.87 ng/mL (ref 0.10–4.00)

## 2019-01-27 MED ORDER — EDARBYCLOR 40-12.5 MG PO TABS: 1.0000 | ORAL_TABLET | Freq: Every day | ORAL | 0 refills | Status: DC

## 2019-01-27 MED ORDER — ATORVASTATIN CALCIUM 80 MG PO TABS: ORAL_TABLET | ORAL | 1 refills | Status: DC

## 2019-01-27 MED ORDER — VASCEPA 1 G PO CAPS: 2.0000 | ORAL_CAPSULE | Freq: Two times a day (BID) | ORAL | 1 refills | Status: DC

## 2019-01-27 MED ORDER — JANUMET XR 100-1000 MG PO TB24: 1.0000 | ORAL_TABLET | Freq: Every day | ORAL | 1 refills | Status: DC

## 2019-01-27 NOTE — Telephone Encounter (Signed)
Key: [id number]

## 2019-01-27 NOTE — Patient Instructions (Signed)

## 2019-01-27 NOTE — Progress Notes (Signed)
Subjective:  Patient ID: Justin Galvan, male    DOB: [birth year]  Age: 58 y.o. MRN: [id number]  CC: Hypertension, Hyperlipidemia, Diabetes, Annual Exam, and Coronary Artery Disease   HPI FREDERICK MARRO presents for a CPX.  He tells me he ran out of [place] about 3 weeks ago.  Since then his blood pressure has not been well controlled.  He also complains that he has developed DOE when he walks up a steep incline.  He has also had a couple of episodes of discomfort in his neck which he tells me is his angina equivalent.  He denies chest pain, diaphoresis, dizziness, lightheadedness, weakness, or near syncope.  Outpatient Medications Prior to Visit  Medication Sig Dispense Refill  . aspirin 81 MG EC tablet TAKE 1 TABLET BY MOUTH EVERY DAY 90 tablet 1  . FARXIGA 10 MG TABS tablet TAKE 1 TABLET BY MOUTH DAILY 30 tablet 1  . glucose blood test strip Test up to two times daily 100 each 12  . PARoxetine (PAXIL) 40 MG tablet TAKE 1 TABLET BY MOUTH EVERY DAY 30 tablet 5  . tadalafil (CIALIS) 20 MG tablet Take 1 tablet (20 mg total) by mouth daily as needed for erectile dysfunction. 8 tablet 0  . atorvastatin (LIPITOR) 80 MG tablet TAKE 1 TABLET (80 MG TOTAL) BY MOUTH DAILY AT 6 PM. 90 tablet 1  . Cholecalciferol 1.25 MG (50000 UT) capsule Take 1 capsule (50,000 Units total) by mouth once a week. 12 capsule 1  . EDARBYCLOR 40-12.5 MG TABS TAKE 1 TABLET BY MOUTH EVERY DAY 90 tablet 0  . SitaGLIPtin-MetFORMIN HCl (JANUMET XR) [phone] MG TB24 Take 1 tablet by mouth daily. 90 tablet 1  . clopidogrel (PLAVIX) 75 MG tablet Take 1 tablet (75 mg total) by mouth daily. (Patient not taking: Reported on 01/27/2019) 90 tablet 2  . diazepam (VALIUM) 2 MG tablet TAKE 1 TABLET (2 MG TOTAL) BY MOUTH EVERY 6 (SIX) HOURS AS NEEDED FOR ANXIETY. (Patient not taking: Reported on 01/27/2019) 30 tablet 2  . Icosapent Ethyl (VASCEPA) 1 g CAPS Take 2 capsules (2 g total) by mouth 2 (two) times daily. (Patient not taking:  Reported on 01/27/2019) 360 capsule 1   No facility-administered medications prior to visit.     ROS Review of Systems  Constitutional: Negative for diaphoresis, fatigue and unexpected weight change.  HENT: Negative.  Negative for sore throat, trouble swallowing and voice change.   Eyes: Negative.   Respiratory: Positive for shortness of breath. Negative for choking and chest tightness.   Cardiovascular: Negative for chest pain, palpitations and leg swelling.  Gastrointestinal: Negative for abdominal pain, constipation, diarrhea, nausea and vomiting.  Endocrine: Negative.  Negative for polydipsia and polyphagia.  Genitourinary: Negative.  Negative for difficulty urinating, dysuria, frequency, hematuria, penile swelling, scrotal swelling, testicular pain and urgency.  Musculoskeletal: Negative for arthralgias and myalgias.  Skin: Negative.  Negative for color change and pallor.  Neurological: Negative.  Negative for dizziness, weakness and light-headedness.  Hematological: Negative for adenopathy. Does not bruise/bleed easily.  Psychiatric/Behavioral: Negative.     Objective:  BP (!) 186/106 (BP Location: Left Arm, Patient Position: Sitting, Cuff Size: Normal)   Pulse 77   Temp 98.1 F (36.7 C) (Oral)   Resp 16   Ht 5\' 7"  (1.702 m)   Wt 176 lb (79.8 kg)   SpO2 97%   BMI 27.57 kg/m   BP Readings from Last 3 Encounters:  01/27/19 (!) 186/106  06/10/18 (!) 142/86  04/01/18 (!) 180/100    Wt Readings from Last 3 Encounters:  01/27/19 176 lb (79.8 kg)  06/10/18 174 lb 4 oz (79 kg)  04/01/18 177 lb (80.3 kg)    Physical Exam Vitals signs reviewed.  Constitutional:      Appearance: Normal appearance.  HENT:     Nose: Nose normal.     Mouth/Throat:     Mouth: Mucous membranes are moist.     Pharynx: Oropharynx is clear.  Eyes:     General: No scleral icterus.    Conjunctiva/sclera: Conjunctivae normal.  Neck:     Musculoskeletal: Normal range of motion and neck  supple.  Cardiovascular:     Rate and Rhythm: Normal rate and regular rhythm.     Heart sounds: No murmur.     Comments: EKG ----  Sinus  Rhythm  -Old inferior-apical infarct.   -  Nonspecific T-abnormality.   ABNORMAL - no change from the prior EKG Pulmonary:     Effort: Pulmonary effort is normal.     Breath sounds: No stridor. No wheezing, rhonchi or rales.  Abdominal:     General: Abdomen is flat. Bowel sounds are normal. There is no distension.     Palpations: Abdomen is soft. There is no hepatomegaly or splenomegaly.     Tenderness: There is no abdominal tenderness.     Hernia: No hernia is present. There is no hernia in the left inguinal area or right inguinal area.  Genitourinary:    Pubic Area: No rash.      Penis: Normal and circumcised. No discharge, swelling or lesions.      Scrotum/Testes: Normal.        Right: Mass, tenderness or swelling not present.        Left: Mass, tenderness or swelling not present.     Epididymis:     Right: Normal. Not enlarged. No mass.     Left: Normal. Not enlarged. No mass.     Prostate: Normal. Not enlarged, not tender and no nodules present.     Rectum: Normal. Guaiac result negative. No mass, tenderness, anal fissure, external hemorrhoid or internal hemorrhoid. Normal anal tone.  Musculoskeletal: Normal range of motion.     Right lower leg: No edema.     Left lower leg: No edema.  Lymphadenopathy:     Cervical: No cervical adenopathy.     Lower Body: No right inguinal adenopathy. No left inguinal adenopathy.  Skin:    General: Skin is warm and dry.     Coloration: Skin is not pale.  Neurological:     General: No focal deficit present.     Mental Status: He is alert.  Psychiatric:        Mood and Affect: Mood normal.     Lab Results  Component Value Date   WBC 7.8 01/27/2019   HGB 14.3 01/27/2019   HCT 42.1 01/27/2019   PLT 270.0 01/27/2019   GLUCOSE 144 (H) 01/27/2019   CHOL 131 01/27/2019   TRIG 126.0 01/27/2019    HDL 38.80 (L) 01/27/2019   LDLDIRECT 108.0 10/15/2017   LDLCALC 67 01/27/2019   ALT 19 01/27/2019   AST 16 01/27/2019   NA 139 01/27/2019   K 4.3 01/27/2019   CL 102 01/27/2019   CREATININE 0.90 01/27/2019   BUN 19 01/27/2019   CO2 25 01/27/2019   TSH 2.81 01/27/2019   PSA 1.87 01/27/2019   INR 1.04 04/23/2015   HGBA1C 7.8 (H) 06/10/2018   MICROALBUR  0.9 01/27/2019    Ct Maxillofacial Limited Wo Contrast  Result Date: 10/17/2016 CLINICAL DATA:  Initial evaluation for intermittent recurrent sinusitis for 1 year. EXAM: CT PARANASAL SINUS LIMITED WITHOUT CONTRAST TECHNIQUE: Non-contiguous multidetector CT images of the paranasal sinuses were obtained in a single plane without contrast. COMPARISON:  None. FINDINGS: Limited coronal images through the paranasal sinuses were performed. Partially visualized frontal sinuses are clear. Visualized ethmoidal air cells well pneumatized and clear. Sphenoid sinuses clear bilaterally. Visualized maxillary sinuses are well pneumatized and clear. Ostiomeatal units grossly patent. Nasal cavities grossly clear. Minimal right-to-left bowing of the nasal septum noted. No evidence for acute or chronic sinusitis identified on this limited exam. IMPRESSION: Clear sinuses. No imaging findings to suggest acute or chronic sinusitis identified. Electronically Signed   By: Rise Mu M.D.   On: 10/17/2016 16:09    Assessment & Plan:   Alexis was seen today for hypertension, hyperlipidemia, diabetes, annual exam and coronary artery disease.  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  I have asked him to restart metformin and the DPP 4 inhibitor. -     Cancel: Hemoglobin A1c; Future -     Microalbumin / creatinine urine ratio; Future -     Ambulatory referral to Ophthalmology -     POCT glycosylated hemoglobin (Hb A1C) -     Azilsartan-Chlorthalidone  (EDARBYCLOR) 40-12.5 MG TABS; Take 1 tablet by mouth daily. -     SitaGLIPtin-MetFORMIN HCl (JANUMET XR) [phone] MG TB24; Take 1 tablet by mouth daily. -     HM Diabetes Foot Exam  Essential hypertension- His blood pressure is not adequately well controlled due to noncompliance.  I have asked him to restart the ARB and thiazide diuretic.  His labs are negative for secondary causes or endorgan damage. -     CBC with Differential/Platelet; Future -     Basic metabolic panel; Future -     Urinalysis, Routine w reflex microscopic; Future -     TSH; Future -     EKG 12-Lead -     Azilsartan-Chlorthalidone (EDARBYCLOR) 40-12.5 MG TABS; Take 1 tablet by mouth daily.  Vitamin D deficiency disease -     VITAMIN D 25 Hydroxy (Vit-D Deficiency, Fractures); Future  Routine general medical examination at a health care facility- Exam completed, labs reviewed, he refused a flu vaccine, tetanus and pneumonia vaccine were updated, colon cancer screening is up-to-date, patient education was given. -     Lipid panel; Future -     PSA; Future  Hyperlipidemia with target LDL less than 70- He has achieved his LDL goal and is doing well on the statin. -     TSH; Future -     Hepatic function panel; Future -     atorvastatin (LIPITOR) 80 MG tablet; TAKE 1 TABLET (80 MG TOTAL) BY MOUTH DAILY AT 6 PM.  Hypertriglyceridemia- His triglycerides are normal now.  I have asked him to continue taking icosapent ethyl for CV risk reduction. -     Icosapent Ethyl (VASCEPA) 1 g CAPS; Take 2 capsules (2 g total) by mouth 2 (two) times daily.  Need for Tdap vaccination -     Tdap vaccine greater than or equal to 7yo IM  Atherosclerosis of native coronary artery of native heart without angina pectoris- His EKG is negative for ischemia but he has a recurrence of suspicious symptoms.  Will continue to work on risk factor modifications.  I have also recommended that he undergo a myocardial perfusion imaging and to continue to  follow-up with cardiology. -     MYOCARDIAL PERFUSION IMAGING; Future -     Ambulatory referral to Cardiology -     Troponin I (High Sensitivity); Future -     Icosapent Ethyl (VASCEPA) 1 g CAPS; Take 2 capsules (2 g total) by mouth 2 (two) times daily. -     EKG 12-Lead -     Azilsartan-Chlorthalidone (EDARBYCLOR) 40-12.5 MG TABS; Take 1 tablet by mouth daily. -     atorvastatin (LIPITOR) 80 MG tablet; TAKE 1 TABLET (80 MG TOTAL) BY MOUTH DAILY AT 6 PM.  Need for pneumococcal vaccination -     Pneumococcal polysaccharide vaccine 23-valent greater than or equal to 2yo subcutaneous/IM   I have discontinued Casimiro Needle C. Shatz's diazepam and Cholecalciferol. I have also changed his Edarbyclor. Additionally, I am having him maintain his glucose blood, clopidogrel, tadalafil, aspirin, Farxiga, PARoxetine, Vascepa, Janumet XR, and atorvastatin.  Meds ordered this encounter  Medications  . Icosapent Ethyl (VASCEPA) 1 g CAPS    Sig: Take 2 capsules (2 g total) by mouth 2 (two) times daily.    Dispense:  360 capsule    Refill:  1  . Azilsartan-Chlorthalidone (EDARBYCLOR) 40-12.5 MG TABS    Sig: Take 1 tablet by mouth daily.    Dispense:  90 tablet    Refill:  0  . SitaGLIPtin-MetFORMIN HCl (JANUMET XR) [phone] MG TB24    Sig: Take 1 tablet by mouth daily.    Dispense:  90 tablet    Refill:  1  . atorvastatin (LIPITOR) 80 MG tablet    Sig: TAKE 1 TABLET (80 MG TOTAL) BY MOUTH DAILY AT 6 PM.    Dispense:  90 tablet    Refill:  1     Follow-up: Return in about 6 weeks (around 03/10/2019).  Sanda Linger, MD

## 2019-01-28 NOTE — Telephone Encounter (Signed)
PA was approved. Pt contacted and informed of same.  

## 2019-01-30 ENCOUNTER — Telehealth (HOSPITAL_COMMUNITY): Payer: Self-pay

## 2019-01-30 NOTE — Telephone Encounter (Signed)
Encounter complete. 

## 2019-02-03 ENCOUNTER — Ambulatory Visit (HOSPITAL_COMMUNITY)
Admission: RE | Admit: 2019-02-03 | Discharge: 2019-02-03 | Disposition: A | Discharge status: Home / Self Care | Payer: 59 | Source: Ambulatory Visit | Attending: Cardiovascular Disease | Admitting: Cardiovascular Disease

## 2019-02-03 ENCOUNTER — Other Ambulatory Visit: Payer: Self-pay

## 2019-02-03 DIAGNOSIS — I2510 Atherosclerotic heart disease of native coronary artery without angina pectoris: Secondary | ICD-10-CM | POA: Insufficient documentation

## 2019-02-03 LAB — MYOCARDIAL PERFUSION IMAGING
LV dias vol: 188 mL (ref 62–150)
LV sys vol: 119 mL
Peak HR: 106 {beats}/min
Rest HR: 71 {beats}/min
SDS: 3
SRS: 23
SSS: 26
TID: 1.17

## 2019-02-03 MED ORDER — REGADENOSON 0.4 MG/5ML IV SOLN
0.4000 mg | Freq: Once | INTRAVENOUS | Status: AC
  Administered 2019-02-03: 0.4 mg via INTRAVENOUS

## 2019-02-03 MED ORDER — TECHNETIUM TC 99M TETROFOSMIN IV KIT
31.6000 | PACK | Freq: Once | INTRAVENOUS | Status: AC | PRN
  Administered 2019-02-03: 31.6 via INTRAVENOUS
  Filled 2019-02-03: qty 32

## 2019-02-03 MED ORDER — AMINOPHYLLINE 25 MG/ML IV SOLN
75.0000 mg | Freq: Once | INTRAVENOUS | Status: AC
  Administered 2019-02-03: 75 mg via INTRAVENOUS

## 2019-02-03 MED ORDER — TECHNETIUM TC 99M TETROFOSMIN IV KIT
10.9000 | PACK | Freq: Once | INTRAVENOUS | Status: AC | PRN
  Administered 2019-02-03: 10.9 via INTRAVENOUS
  Filled 2019-02-03: qty 11

## 2019-02-16 NOTE — Progress Notes (Signed)
Cardiology Office Note:   Date:  02/20/2019  NAME:  Justin Galvan    MRN: [id number] DOB:  08-Jul-1960   PCP:  Etta Grandchild, MD  Cardiologist:  Reatha Harps, MD   Referring MD: Etta Grandchild, MD   Chief Complaint  Patient presents with   Coronary Artery Disease    History of Present Illness:   Justin Galvan is a 58 y.o. male with a hx of hypertension, hyperlipidemia, diabetes, CAD (evidence of prior RCA infarction on nuclear medicine myocardial perfusion imaging) who is being seen today for the evaluation of CAD at the request of Etta Grandchild, MD.  He reports he is doing well and reports no symptoms of angina.  He works in [employer] is able to complete a full day's work without any limitations.  He describes no exertional chest pain or shortness of breath.  He reports that his blood pressure was a bit elevated in the past and he did have symptoms of angina including exertional chest pain and pain rating into the neck.  Apparently since restarting his ARB-chlorthalidone combination pill he is done well.  His diabetes shows that his A1c is 7.4.  Most recent LDL is 67.  He is also on an SGLT2 inhibitor for his diabetes which is good for a patient with CAD.  He is a former smoker but quit in the 90s.  I have personally reviewed his most recent angiogram which shows a jailed first diagonal branch as well as a CTO of the RCA.  Most recent echocardiogram with ejection fraction 45% he has no symptoms of heart failure today.  He denies any orthopnea, PND, lower extremity edema.   Past Medical History: Past Medical History:  Diagnosis Date   Anginal pain (HCC)    Arthritis    "fingers" (04/22/2015)   BPH (benign prostatic hypertrophy)    CAD, NATIVE VESSEL, Hx. 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) 08/19/2008   DM2 (diabetes mellitus, type 2) (HCC)    Erectile dysfunction due to arterial insufficiency 10/10/2016   Essential hypertension 08/19/2008   HLD (hyperlipidemia)    HTN (hypertension)    Hypertriglyceridemia 05/03/2009        Kidney stones    MI (myocardial infarction) (HCC) 1997; ~ 2002; 04/2013   MRSA carrier 11/20/2016   S/P coronary artery stent placement -mid LAD, 05/06/13 Promus Premier DES 05/07/2013    Past Surgical History: Past Surgical History:  Procedure Laterality Date   CARDIAC CATHETERIZATION N/A 04/26/2015   Procedure: Left Heart Cath and Coronary Angiography;  Surgeon: Kathleene Hazel, MD;  Location: [facility] INVASIVE CV LAB;  Service: Cardiovascular;  Laterality: N/A;   CORONARY ANGIOPLASTY WITH STENT PLACEMENT  1997 - 04/2013   "got several stents; may 8 or 9"   CORONARY ANGIOPLASTY WITH STENT PLACEMENT  05/06/2013   DES/LAD               DR Clifton James   LEFT HEART CATHETERIZATION WITH CORONARY ANGIOGRAM N/A 05/06/2013   Procedure: LEFT HEART CATHETERIZATION WITH CORONARY ANGIOGRAM;  Surgeon: Kathleene Hazel, MD;  Location: [facility] CATH LAB;  Service: Cardiovascular;  Laterality: N/A;    Current Medications: Current Meds  Medication Sig   aspirin 81 MG EC tablet TAKE 1 TABLET  BY MOUTH EVERY DAY   atorvastatin (LIPITOR) 80 MG tablet TAKE 1 TABLET (80 MG TOTAL) BY MOUTH DAILY AT 6 PM.   Azilsartan-Chlorthalidone (EDARBYCLOR) 40-12.5 MG TABS Take 1 tablet by mouth daily.   FARXIGA 10 MG TABS tablet TAKE 1 TABLET BY MOUTH DAILY   glucose blood test strip Test up to two times daily   Icosapent Ethyl (VASCEPA) 1 g CAPS Take 2 capsules (2 g total) by mouth 2 (two) times daily.   PARoxetine (PAXIL) 40 MG tablet TAKE 1 TABLET BY MOUTH EVERY DAY   SitaGLIPtin-MetFORMIN HCl (JANUMET XR) [phone] MG TB24 Take 1 tablet by mouth daily.   tadalafil (CIALIS) 20 MG tablet Take 1 tablet (20 mg total) by mouth daily as  needed for erectile dysfunction.   [DISCONTINUED] clopidogrel (PLAVIX) 75 MG tablet Take 1 tablet (75 mg total) by mouth daily.     Allergies:    Patient has no known allergies.   Social History: Social History   Socioeconomic History   Marital status: Married    Spouse name: Not on file   Number of children: 2   Years of education: Not on file   Highest education level: Not on file  Occupational History   Occupation: Quarry manager strain: Not on file   Food insecurity    Worry: Not on file    Inability: Not on file   Transportation needs    Medical: Not on file    Non-medical: Not on file  Tobacco Use   Smoking status: Former Smoker    Packs/day: 2.75    Years: 22.00    Pack years: 60.50    Types: Cigarettes    Quit date: 09/07/1996    Years since quitting: 22.4   Smokeless tobacco: Never Used  Substance and Sexual Activity   Alcohol use: Yes    Comment: 04/22/2015 "might drink a 6 pack of beer/year"   Drug use: No   Sexual activity: Not Currently  Lifestyle   Physical activity    Days per week: Not on file    Minutes per session: Not on file   Stress: Not on file  Relationships   Social connections    Talks on phone: Not on file    Gets together: Not on file    Attends religious service: Not on file    Active member of club or organization: Not on file    Attends meetings of clubs or organizations: Not on file    Relationship status: Not on file  Other Topics Concern   Not on file  Social History Narrative   Not on file     Family History: The patient's family history includes Heart disease in his mother; Hypertension in an other family member. There is no history of Cancer, Diabetes, Early death, Hyperlipidemia, Kidney disease, Stroke, or Alcohol abuse.  ROS:   All other ROS reviewed and negative. Pertinent positives noted in the HPI.     EKGs/Labs/Other Studies Reviewed:   The following studies  were personally reviewed by me today:  EKG:  EKG is ordered today.  The ekg ordered today demonstrates normal sinus rhythm, heart rate 68, prior inferior infarct, single PVC noted, and was personally reviewed by me.   TTE 04/23/2015 Left ventricle: The cavity size was normal. Wall thickness was   increased in a pattern of mild LVH. Systolic function was normal.   The estimated ejection fraction was 45%. There is hypokinesis of  the basal-midinferior myocardium. Doppler parameters are   consistent with abnormal left ventricular relaxation (grade 1   diastolic dysfunction). - Aortic valve: Mildly calcified annulus. Trileaflet; mildly   calcified leaflets. - Left atrium: The atrium was moderately dilated. - Right atrium: Central venous pressure (est): 3 mm Hg. - Atrial septum: No defect or patent foramen ovale was identified. - Tricuspid valve: There was trivial regurgitation. - Pulmonary arteries: Systolic pressure could not be accurately   estimated. - Pericardium, extracardiac: There was no pericardial effusion.  LHC 04/27/2015 1. Double vessel CAD. 2. Patent stent mid LAD with no restenosis.  3. Severe disease very small diagonal branch at the ostium where the vessel is jailed by the LAD stent. Too small for PCI.  4. Chronic occlusion mid RCA. The distal vessel fills from left to right collaterals.  5. Mild segmental LV systolic dysfunction.   NM MPI (01/19/2018)  Nuclear stress EF: 41%.  There was no ST segment deviation noted during stress.  Defect 1: There is a large defect of severe severity present in the basal inferior, basal inferolateral, mid inferior, mid inferolateral and apical inferior location.  Findings consistent with prior myocardial infarction with peri-infarct ischemia.  This is an intermediate risk study.  The left ventricular ejection fraction is moderately decreased (30-44%).   Intermediate risk stress nuclear study with a large scar in the right coronary  artery territory, with mild peri-infarct ischemia. Moderately reduced left ventricular systolic function due to inferior akinesis.  NM MPI (02/03/2019)  The left ventricular ejection fraction is moderately decreased (30-44%).  Nuclear stress EF: 37%.  There was no ST segment deviation noted during stress.  No T wave inversion was noted during stress.  Defect 1: There is a large defect of severe severity present in the basal inferoseptal, basal inferior, basal inferolateral, mid inferoseptal, mid inferior, mid inferolateral, apical septal and apex location.  Findings consistent with prior myocardial infarction with peri-infarct ischemia.  This is an intermediate risk study.  At baseline, ECG shows pathologic Q wave in 2 or more leads   Large size, severe severity inferoseptal, inferior and inferolateral perfusion defect with minimal reversibility (SDS 3) - suggestive of infarct with minimal peri-infarct ischemia. LVEF 37% with inferior, inferoseptal and basal to mid inferolateral hypokinesis. This is an intermediate risk study.   Recent Labs: 01/27/2019: ALT 19; BUN 19; Creatinine, Ser 0.90; Hemoglobin 14.3; Platelets 270.0; Potassium 4.3; Sodium 139; TSH 2.81   Recent Lipid Panel    Component Value Date/Time   CHOL 131 01/27/2019 0900   TRIG 126.0 01/27/2019 0900   HDL 38.80 (L) 01/27/2019 0900   CHOLHDL 3 01/27/2019 0900   VLDL 25.2 01/27/2019 0900   LDLCALC 67 01/27/2019 0900   LDLDIRECT 108.0 10/15/2017 0953    Physical Exam:   VS:  BP (!) 142/86    Pulse 67    Temp 97.9 F (36.6 C)    Ht  (1.702 m)    Wt 170 lb 6.4 oz (77.3 kg)    SpO2 97%    BMI 26.69 kg/m    Wt Readings from Last 3 Encounters:  02/20/19 170 lb 6.4 oz (77.3 kg)  02/03/19 176 lb (79.8 kg)  01/27/19 176 lb (79.8 kg)    General: Well nourished, well developed, in no acute distress Heart: Atraumatic, normal size  Eyes: PEERLA, EOMI  Neck: Supple, no JVD Endocrine: No thryomegaly Cardiac:  Normal S1, S2; RRR; no murmurs, rubs, or gallops Lungs: Clear to auscultation bilaterally, no wheezing,  rhonchi or rales  Abd: Soft, nontender, no hepatomegaly  Ext: No edema, pulses 2+ Musculoskeletal: No deformities, BUE and BLE strength normal and equal Skin: Warm and dry, no rashes   Neuro: Alert and oriented to person, place, time, and situation, CNII-XII grossly intact, no focal deficits  Psych: Normal mood and affect   ASSESSMENT:   Filbert SchilderMichael C Shatto is a 58 y.o. male who presents for the following: 1. Coronary artery disease involving native coronary artery of native heart without angina pectoris   2. Essential hypertension   3. Mixed hyperlipidemia   4. 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 Coronary artery disease involving native coronary artery of native heart without angina pectoris -Most recent angiogram in 2017 shows a jailed diagonal branch from a prior patent stent in the LAD, and a chronic total occlusion of the mid RCA with good collateral flow. -He has no symptoms of angina and is quite stable on examination -His LDL cholesterol is at goal, continue his statin and he is also on Vascepa -We will continue aspirin 81 mg, I see no benefit of DAPT -His A1c is 7.4 and he is on an SGLT2 inhibitor, this is beneficial in CAD patients -We will give him sublingual nitroglycerin as needed, but he reports no symptoms  2. Essential hypertension -Blood pressure bit elevated today but he reports he gets this when he is in the doctor's office, it is good  3. Mixed hyperlipidemia -Most recent LDL 67, continue statin and Vascepa  4. 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  Medication Adjustments/Labs and Tests Ordered: Current medicines are reviewed at  length with the patient today.  Concerns regarding medicines are outlined above.  Orders Placed This Encounter  Procedures   EKG 12-Lead   Meds ordered this encounter  Medications   nitroGLYCERIN (NITROSTAT) 0.4 MG SL tablet    Sig: Place 1 tablet (0.4 mg total) under the tongue every 5 (five) minutes as needed for chest pain.    Dispense:  25 tablet    Refill:  6    Patient Instructions  Medication Instructions:  STOP TAKING PLAVIX - CLOPIDOGREL  CONTINUE TAKING ASPIRIN 81 MG , ALONG WITH ALL YOUR OTHER MEDICATIONS  DO NOT USE  CIALIS IF YOU USE NITROGLYCERIN SUBLINGUAL TABLETS  -MAY USE NITROGLYCERIN TABLET  SUBLINGUAL AS NEEDED FOR CHEST PAIN - EVERY 5 MINUTES UP TO 3 TABLETS WITH EACH EPISODES  *If you need a refill on your cardiac medications before your next appointment, please call your pharmacy*  Lab Work: NOT NEEDED I  Testing/Procedures: NOT NEEDED  Follow-Up: At [facility], you and your health needs are our priority.  As part of our continuing mission to provide you with exceptional heart care, we have created designated Provider Care Teams.  These Care Teams include your primary Cardiologist (physician) and Advanced Practice Providers (APPs -  Physician Assistants and Nurse Practitioners) who all work together to provide you with the care you need, when you need it.  Your next appointment:   6 months  The format for your next appointment:   In Person  Provider:   Lennie OdorWesley O'Neal, MD  Other Instructions    Nitroglycerin sublingual tablets What is this medicine? NITROGLYCERIN (nye troe GLI ser in) is a type  of vasodilator. It relaxes blood vessels, increasing the blood and oxygen supply to your heart. This medicine is used to relieve chest pain caused by angina. It is also used to prevent chest pain before activities like climbing stairs, going outdoors in cold weather, or sexual activity. This medicine may be used for other purposes; ask your health  care provider or pharmacist if you have questions. COMMON BRAND NAME(S): Nitroquick, Nitrostat, Nitrotab What should I tell my health care provider before I take this medicine? They need to know if you have any of these conditions:  anemia  head injury, recent stroke, or bleeding in the brain  liver disease  previous heart attack  an unusual or allergic reaction to nitroglycerin, other medicines, foods, dyes, or preservatives  pregnant or trying to get pregnant  breast-feeding How should I use this medicine? Take this medicine by mouth as needed. At the first sign of an angina attack (chest pain or tightness) place one tablet under your tongue. You can also take this medicine 5 to 10 minutes before an event likely to produce chest pain. Follow the directions on the prescription label. Let the tablet dissolve under the tongue. Do not swallow whole. Replace the dose if you accidentally swallow it. It will help if your mouth is not dry. Saliva around the tablet will help it to dissolve more quickly. Do not eat or drink, smoke or chew tobacco while a tablet is dissolving. If you are not better within 5 minutes after taking ONE dose of nitroglycerin, call 9-1-1 immediately to seek emergency medical care. Do not take more than 3 nitroglycerin tablets over 15 minutes. If you take this medicine often to relieve symptoms of angina, your doctor or health care professional may provide you with different instructions to manage your symptoms. If symptoms do not go away after following these instructions, it is important to call 9-1-1 immediately. Do not take more than 3 nitroglycerin tablets over 15 minutes. Talk to your pediatrician regarding the use of this medicine in children. Special care may be needed. Overdosage: If you think you have taken too much of this medicine contact a poison control center or emergency room at once. NOTE: This medicine is only for you. Do not share this medicine with  others. What if I miss a dose? This does not apply. This medicine is only used as needed. What may interact with this medicine? Do not take this medicine with any of the following medications:  certain migraine medicines like ergotamine and dihydroergotamine (DHE)  medicines used to treat erectile dysfunction like sildenafil, tadalafil, and vardenafil  riociguat This medicine may also interact with the following medications:  alteplase  aspirin  heparin  medicines for high blood pressure  medicines for mental depression  other medicines used to treat angina  phenothiazines like chlorpromazine, mesoridazine, prochlorperazine, thioridazine This list may not describe all possible interactions. Give your health care provider a list of all the medicines, herbs, non-prescription drugs, or dietary supplements you use. Also tell them if you smoke, drink alcohol, or use illegal drugs. Some items may interact with your medicine. What should I watch for while using this medicine? Tell your doctor or health care professional if you feel your medicine is no longer working. Keep this medicine with you at all times. Sit or lie down when you take your medicine to prevent falling if you feel dizzy or faint after using it. Try to remain calm. This will help you to feel better faster. If  you feel dizzy, take several deep breaths and lie down with your feet propped up, or bend forward with your head resting between your knees. You may get drowsy or dizzy. Do not drive, use machinery, or do anything that needs mental alertness until you know how this drug affects you. Do not stand or sit up quickly, especially if you are an older patient. This reduces the risk of dizzy or fainting spells. Alcohol can make you more drowsy and dizzy. Avoid alcoholic drinks. Do not treat yourself for coughs, colds, or pain while you are taking this medicine without asking your doctor or health care professional for advice.  Some ingredients may increase your blood pressure. What side effects may I notice from receiving this medicine? Side effects that you should report to your doctor or health care professional as soon as possible:  blurred vision  dry mouth  skin rash  sweating  the feeling of extreme pressure in the head  unusually weak or tired Side effects that usually do not require medical attention (report to your doctor or health care professional if they continue or are bothersome):  flushing of the face or neck  headache  irregular heartbeat, palpitations  nausea, vomiting This list may not describe all possible side effects. Call your doctor for medical advice about side effects. You may report side effects to FDA at 1-800-FDA-1088. Where should I keep my medicine? Keep out of the reach of children. Store at room temperature between 20 and 25 degrees C (68 and 77 degrees F). Store in Retail buyer. Protect from light and moisture. Keep tightly closed. Throw away any unused medicine after the expiration date. NOTE: This sheet is a summary. It may not cover all possible information. If you have questions about this medicine, talk to your doctor, pharmacist, or health care provider.  2020 Elsevier/Gold Standard (2013-02-05 17:57:36)      Signed, Gerri Spore T. Flora Lipps, MD [facility]  [address], [address] [place], [place] [address] [phone]  02/20/2019 9:26 AM

## 2019-02-20 ENCOUNTER — Ambulatory Visit (INDEPENDENT_AMBULATORY_CARE_PROVIDER_SITE_OTHER): Payer: 59 | Admitting: Cardiovascular Disease

## 2019-02-20 ENCOUNTER — Encounter: Payer: Self-pay | Admitting: Cardiovascular Disease

## 2019-02-20 ENCOUNTER — Other Ambulatory Visit: Payer: Self-pay

## 2019-02-20 VITALS — BP 142/86 | HR 67 | Temp 97.9°F | Ht 67.0 in | Wt 170.4 lb

## 2019-02-20 DIAGNOSIS — I70209 Unspecified atherosclerosis of native arteries of extremities, unspecified extremity: Secondary | ICD-10-CM

## 2019-02-20 DIAGNOSIS — I2510 Atherosclerotic heart disease of native coronary artery without angina pectoris: Secondary | ICD-10-CM | POA: Diagnosis not present

## 2019-02-20 DIAGNOSIS — I10 Essential (primary) hypertension: Secondary | ICD-10-CM | POA: Diagnosis not present

## 2019-02-20 DIAGNOSIS — E1151 Type 2 diabetes mellitus with diabetic peripheral angiopathy without gangrene: Secondary | ICD-10-CM

## 2019-02-20 DIAGNOSIS — E782 Mixed hyperlipidemia: Secondary | ICD-10-CM

## 2019-02-20 MED ORDER — NITROGLYCERIN 0.4 MG SL SUBL: 0.4000 mg | SUBLINGUAL_TABLET | SUBLINGUAL | 6 refills | Status: DC | PRN

## 2019-02-20 NOTE — Patient Instructions (Signed)
Medication Instructions:  STOP TAKING PLAVIX - CLOPIDOGREL  CONTINUE TAKING ASPIRIN 81 MG , ALONG WITH ALL YOUR OTHER MEDICATIONS  DO NOT USE  CIALIS IF YOU USE NITROGLYCERIN SUBLINGUAL TABLETS  -MAY USE NITROGLYCERIN TABLET  SUBLINGUAL AS NEEDED FOR CHEST PAIN - EVERY 5 MINUTES UP TO 3 TABLETS WITH EACH EPISODES  *If you need a refill on your cardiac medications before your next appointment, please call your pharmacy*  Lab Work: NOT NEEDED I  Testing/Procedures: NOT NEEDED  Follow-Up: At [facility], you and your health needs are our priority.  As part of our continuing mission to provide you with exceptional heart care, we have created designated Provider Care Teams.  These Care Teams include your primary Cardiologist (physician) and Advanced Practice Providers (APPs -  Physician Assistants and Nurse Practitioners) who all work together to provide you with the care you need, when you need it.  Your next appointment:   6 months  The format for your next appointment:   In Person  Provider:   Eleonore Chiquito, MD  Other Instructions    Nitroglycerin sublingual tablets What is this medicine? NITROGLYCERIN (nye troe GLI ser in) is a type of vasodilator. It relaxes blood vessels, increasing the blood and oxygen supply to your heart. This medicine is used to relieve chest pain caused by angina. It is also used to prevent chest pain before activities like climbing stairs, going outdoors in cold weather, or sexual activity. This medicine may be used for other purposes; ask your health care provider or pharmacist if you have questions. COMMON BRAND NAME(S): Nitroquick, Nitrostat, Nitrotab What should I tell my health care provider before I take this medicine? They need to know if you have any of these conditions:  anemia  head injury, recent stroke, or bleeding in the brain  liver disease  previous heart attack  an unusual or allergic reaction to nitroglycerin, other  medicines, foods, dyes, or preservatives  pregnant or trying to get pregnant  breast-feeding How should I use this medicine? Take this medicine by mouth as needed. At the first sign of an angina attack (chest pain or tightness) place one tablet under your tongue. You can also take this medicine 5 to 10 minutes before an event likely to produce chest pain. Follow the directions on the prescription label. Let the tablet dissolve under the tongue. Do not swallow whole. Replace the dose if you accidentally swallow it. It will help if your mouth is not dry. Saliva around the tablet will help it to dissolve more quickly. Do not eat or drink, smoke or chew tobacco while a tablet is dissolving. If you are not better within 5 minutes after taking ONE dose of nitroglycerin, call 9-1-1 immediately to seek emergency medical care. Do not take more than 3 nitroglycerin tablets over 15 minutes. If you take this medicine often to relieve symptoms of angina, your doctor or health care professional may provide you with different instructions to manage your symptoms. If symptoms do not go away after following these instructions, it is important to call 9-1-1 immediately. Do not take more than 3 nitroglycerin tablets over 15 minutes. Talk to your pediatrician regarding the use of this medicine in children. Special care may be needed. Overdosage: If you think you have taken too much of this medicine contact a poison control center or emergency room at once. NOTE: This medicine is only for you. Do not share this medicine with others. What if I miss a dose? This does  not apply. This medicine is only used as needed. What may interact with this medicine? Do not take this medicine with any of the following medications:  certain migraine medicines like ergotamine and dihydroergotamine (DHE)  medicines used to treat erectile dysfunction like sildenafil, tadalafil, and vardenafil  riociguat This medicine may also interact  with the following medications:  alteplase  aspirin  heparin  medicines for high blood pressure  medicines for mental depression  other medicines used to treat angina  phenothiazines like chlorpromazine, mesoridazine, prochlorperazine, thioridazine This list may not describe all possible interactions. Give your health care provider a list of all the medicines, herbs, non-prescription drugs, or dietary supplements you use. Also tell them if you smoke, drink alcohol, or use illegal drugs. Some items may interact with your medicine. What should I watch for while using this medicine? Tell your doctor or health care professional if you feel your medicine is no longer working. Keep this medicine with you at all times. Sit or lie down when you take your medicine to prevent falling if you feel dizzy or faint after using it. Try to remain calm. This will help you to feel better faster. If you feel dizzy, take several deep breaths and lie down with your feet propped up, or bend forward with your head resting between your knees. You may get drowsy or dizzy. Do not drive, use machinery, or do anything that needs mental alertness until you know how this drug affects you. Do not stand or sit up quickly, especially if you are an older patient. This reduces the risk of dizzy or fainting spells. Alcohol can make you more drowsy and dizzy. Avoid alcoholic drinks. Do not treat yourself for coughs, colds, or pain while you are taking this medicine without asking your doctor or health care professional for advice. Some ingredients may increase your blood pressure. What side effects may I notice from receiving this medicine? Side effects that you should report to your doctor or health care professional as soon as possible:  blurred vision  dry mouth  skin rash  sweating  the feeling of extreme pressure in the head  unusually weak or tired Side effects that usually do not require medical attention  (report to your doctor or health care professional if they continue or are bothersome):  flushing of the face or neck  headache  irregular heartbeat, palpitations  nausea, vomiting This list may not describe all possible side effects. Call your doctor for medical advice about side effects. You may report side effects to FDA at 1-800-FDA-1088. Where should I keep my medicine? Keep out of the reach of children. Store at room temperature between 20 and 25 degrees C (68 and 77 degrees F). Store in Retail buyer. Protect from light and moisture. Keep tightly closed. Throw away any unused medicine after the expiration date. NOTE: This sheet is a summary. It may not cover all possible information. If you have questions about this medicine, talk to your doctor, pharmacist, or health care provider.  2020 Elsevier/Gold Standard (2013-02-05 17:57:36)

## 2019-02-22 ENCOUNTER — Other Ambulatory Visit: Payer: Self-pay | Admitting: Internal Medicine

## 2019-02-22 DIAGNOSIS — E1151 Type 2 diabetes mellitus with diabetic peripheral angiopathy without gangrene: Secondary | ICD-10-CM

## 2019-04-10 ENCOUNTER — Other Ambulatory Visit: Payer: Self-pay | Admitting: Internal Medicine

## 2019-04-10 DIAGNOSIS — I2510 Atherosclerotic heart disease of native coronary artery without angina pectoris: Secondary | ICD-10-CM

## 2019-05-05 ENCOUNTER — Other Ambulatory Visit: Payer: Self-pay | Admitting: Internal Medicine

## 2019-05-05 DIAGNOSIS — I2510 Atherosclerotic heart disease of native coronary artery without angina pectoris: Secondary | ICD-10-CM

## 2019-05-12 ENCOUNTER — Encounter: Payer: Self-pay | Admitting: Internal Medicine

## 2019-06-14 ENCOUNTER — Other Ambulatory Visit: Payer: Self-pay | Admitting: Internal Medicine

## 2019-06-14 DIAGNOSIS — F418 Other specified anxiety disorders: Secondary | ICD-10-CM

## 2019-06-14 MED ORDER — PAROXETINE HCL 40 MG PO TABS: 40.0000 mg | ORAL_TABLET | Freq: Every day | ORAL | 5 refills | Status: DC

## 2019-07-06 ENCOUNTER — Other Ambulatory Visit: Payer: Self-pay | Admitting: Internal Medicine

## 2019-07-06 DIAGNOSIS — I10 Essential (primary) hypertension: Secondary | ICD-10-CM

## 2019-07-06 DIAGNOSIS — I70209 Unspecified atherosclerosis of native arteries of extremities, unspecified extremity: Secondary | ICD-10-CM

## 2019-07-06 DIAGNOSIS — E1151 Type 2 diabetes mellitus with diabetic peripheral angiopathy without gangrene: Secondary | ICD-10-CM

## 2019-07-06 DIAGNOSIS — I2510 Atherosclerotic heart disease of native coronary artery without angina pectoris: Secondary | ICD-10-CM

## 2019-07-15 ENCOUNTER — Other Ambulatory Visit: Payer: Self-pay | Admitting: Internal Medicine

## 2019-07-15 DIAGNOSIS — F418 Other specified anxiety disorders: Secondary | ICD-10-CM

## 2019-08-15 ENCOUNTER — Other Ambulatory Visit: Payer: Self-pay | Admitting: Internal Medicine

## 2019-08-15 DIAGNOSIS — E1151 Type 2 diabetes mellitus with diabetic peripheral angiopathy without gangrene: Secondary | ICD-10-CM

## 2019-08-15 DIAGNOSIS — E785 Hyperlipidemia, unspecified: Secondary | ICD-10-CM

## 2019-08-15 DIAGNOSIS — I2510 Atherosclerotic heart disease of native coronary artery without angina pectoris: Secondary | ICD-10-CM

## 2019-08-21 ENCOUNTER — Ambulatory Visit (INDEPENDENT_AMBULATORY_CARE_PROVIDER_SITE_OTHER): Payer: 59 | Admitting: Internal Medicine

## 2019-08-21 ENCOUNTER — Other Ambulatory Visit: Payer: Self-pay

## 2019-08-21 ENCOUNTER — Encounter: Payer: Self-pay | Admitting: Internal Medicine

## 2019-08-21 ENCOUNTER — Other Ambulatory Visit: Payer: Self-pay | Admitting: Internal Medicine

## 2019-08-21 VITALS — BP 180/100 | HR 87 | Temp 98.1°F | Ht 67.0 in | Wt 174.0 lb

## 2019-08-21 DIAGNOSIS — I10 Essential (primary) hypertension: Secondary | ICD-10-CM

## 2019-08-21 DIAGNOSIS — M25561 Pain in right knee: Secondary | ICD-10-CM

## 2019-08-21 DIAGNOSIS — I70209 Unspecified atherosclerosis of native arteries of extremities, unspecified extremity: Secondary | ICD-10-CM

## 2019-08-21 DIAGNOSIS — E1151 Type 2 diabetes mellitus with diabetic peripheral angiopathy without gangrene: Secondary | ICD-10-CM

## 2019-08-21 LAB — POCT GLYCOSYLATED HEMOGLOBIN (HGB A1C): Hemoglobin A1C: 8.1 % — AB (ref 4.0–5.6)

## 2019-08-21 MED ORDER — METFORMIN HCL ER 500 MG PO TB24: 1000.0000 mg | ORAL_TABLET | Freq: Every day | ORAL | 3 refills | Status: DC

## 2019-08-21 MED ORDER — OLMESARTAN MEDOXOMIL 40 MG PO TABS: 40.0000 mg | ORAL_TABLET | Freq: Every day | ORAL | 3 refills | Status: DC

## 2019-08-21 NOTE — Assessment & Plan Note (Signed)
Uncontrolled, "mostly" does not take the edarbyclor - states pharmacy not always in stock and expensive, ok for generic benicar 40 qd, fu PCP 3 mo or sooner if needed

## 2019-08-21 NOTE — Patient Instructions (Addendum)
You appear to have a possible ruptured right knee bakers cyst and/or meniscus (cartilage) tear- You will be contacted regarding the referral for: Sports Medicine on the first floor this building  Ok for tylenol as needed for pain  Ok to change the edarbyclor to generic Benicar 40 mg per day  Your A1c was done today  Please continue all other medications as before, and refills have been done if requested.  Please have the pharmacy call with any other refills you may need.  Please continue your efforts at being more active, low cholesterol diet, and weight control.  Please keep your appointments with your specialists as you may have planned  Please make an Appointment to return in 3 months, or sooner if needed, to Dr Sanda Linger (your PCP) - the office will call

## 2019-08-21 NOTE — Progress Notes (Signed)
Subjective:    Patient ID: Justin Galvan, male    DOB: 1960/11/13, 59 y.o.   MRN: [id number]  HPI  Here as pt of Dr Yetta Barre with acute visit for c/o 6 days onset right knee pain, swelling extending to the calf/leg; started at work with kneeling to the right knee for over 30 min when felt a sudden discomfort mod to severe "pop" like more the anteromedial aspect, made him stand up to help the pain severe to start, now mod to severe, constant, with swelling to the knee, yellowish bruising more to the anteromedial aspect with extension of sore tender swelling to the posteromedial leg as well.  No foot pain or numbness, has been walking on it without further giveaways or falls.  No other fever, trauma.  No prior hx of knee disorder. Pt denies chest pain, increased sob or doe, wheezing, orthopnea, PND, increased LE swelling, palpitations, dizziness or syncope.   Pt denies polydipsia, polyuria, due for f/u a1c.  BP elevated today, does not often take the edarbyclor as too expensive.  BP Readings from Last 3 Encounters:  08/21/19 (!) 180/100  02/20/19 (!) 142/86  01/27/19 (!) 186/106   Past Medical History:  Diagnosis Date  . Anginal pain (HCC)   . Arthritis    "fingers" (04/22/2015)  . BPH (benign prostatic hypertrophy)   . CAD, NATIVE VESSEL, Hx. stent-RCA 1997; 2 Taxusprox & mid RCA 06/2003, cath 05/06/13 100% RCA, mid LAD disease 05/03/2009   Qualifier: Diagnosis of  By: Denyse Amass CMA, Carol    . Depression   . 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 DM2 (diabetes mellitus, type 2) (HCC)   . Erectile dysfunction due to arterial insufficiency 10/10/2016  . Essential hypertension 08/19/2008  . HLD (hyperlipidemia)   . HTN (hypertension)   . Hypertriglyceridemia 05/03/2009       . Kidney stones   . MI (myocardial infarction) (HCC) 1997; ~ 2002; 04/2013  . 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 S/P coronary artery stent placement -mid LAD, 05/06/13 Promus Premier  DES 05/07/2013   Past Surgical History:  Procedure Laterality Date  . CARDIAC CATHETERIZATION N/A 04/26/2015   Procedure: Left Heart Cath and Coronary Angiography;  Surgeon: Kathleene Hazel, MD;  Location: [facility] INVASIVE CV LAB;  Service: Cardiovascular;  Laterality: N/A;  . CORONARY ANGIOPLASTY WITH STENT PLACEMENT  1997 - 04/2013   "got several stents; may 8 or 9"  . CORONARY ANGIOPLASTY WITH STENT PLACEMENT  05/06/2013   DES/LAD               DR Clifton Ashonti Leandro  . LEFT HEART CATHETERIZATION WITH CORONARY ANGIOGRAM N/A 05/06/2013   Procedure: LEFT HEART CATHETERIZATION WITH CORONARY ANGIOGRAM;  Surgeon: Kathleene Hazel, MD;  Location[facility] CATH LAB;  Service: Cardiovascular;  Laterality: N/A;    reports that he quit smoking about 22 years ago. His smoking use included cigarettes. He has a 60.50 pack-year smoking history. He has never used smokeless tobacco. He reports current alcohol use. He reports that he does not use drugs. family history includes Heart disease in his mother; Hypertension in an other family member. No Known Allergies Current Outpatient Medications on File Prior to Visit  Medication Sig Dispense Refill  . ASPIRIN LOW DOSE 81 MG EC tablet TAKE 1 TABLET (81 MG TOTAL) BY MOUTH DAILY. OVERDUE FOR FOLLOW=UP APPT MUST SEE PROVIDER FOR FUTURE REFILLS 90 tablet 1  . atorvastatin (LIPITOR) 80 MG tablet TAKE 1 TABLET (  80 MG TOTAL) BY MOUTH DAILY AT 6 PM. 90 tablet 1  . FARXIGA 10 MG TABS tablet TAKE 1 TABLET BY MOUTH DAILY 90 tablet 1  . glucose blood test strip Test up to two times daily 100 each 12  . Icosapent Ethyl (VASCEPA) 1 g CAPS Take 2 capsules (2 g total) by mouth 2 (two) times daily. 360 capsule 1  . PARoxetine (PAXIL) 40 MG tablet TAKE 1 TABLET BY MOUTH EVERY DAY 90 tablet 1  . SitaGLIPtin-MetFORMIN HCl (JANUMET XR) [phone] MG TB24 Take 1 tablet by mouth daily. 90 tablet 1  . tadalafil (CIALIS) 20 MG tablet Take 1 tablet (20 mg total) by mouth daily as needed for  erectile dysfunction. 8 tablet 0  . nitroGLYCERIN (NITROSTAT) 0.4 MG SL tablet Place 1 tablet (0.4 mg total) under the tongue every 5 (five) minutes as needed for chest pain. 25 tablet 6   No current facility-administered medications on file prior to visit.   Review of Systems All otherwise neg per pt    Objective:   Physical Exam BP (!) 180/100 (BP Location: Left Arm, Patient Position: Sitting, Cuff Size: Large)   Pulse 87   Temp 98.1 F (36.7 C) (Oral)   Ht 5\' 7"  (1.702 m)   Wt 174 lb (78.9 kg)   SpO2 96%   BMI 27.25 kg/m  VS noted,  Constitutional: Pt appears in NAD HENT: Head: NCAT.  Right Ear: External ear normal.  Left Ear: External ear normal.  Eyes: . Pupils are equal, round, and reactive to light. Conjunctivae and EOM are normal Nose: without d/c or deformity Neck: Neck supple. Gross normal ROM Cardiovascular: Normal rate and regular rhythm.   Pulmonary/Chest: Effort normal and breath sounds without rales or wheezing.  Neurological: Pt is alert. At baseline orientation, motor grossly intact Skin: Skin is warm. No rashes, other new lesions, no LE edema Psychiatric: Pt behavior is normal without agitation  Right knee with 1+ diffuse effusion with warm tender faint yellowish bruising to the anteromedial aspect near the joint line with mild reduced ROM; mild tender swelling to post knee fossa and diffuse post calf as well o/w neurovascularly intact  POCT glycosylated hemoglobin (Hb A1C) Order: [id number] Status:  Final result Visible to patient:  No (inaccessible in MyChart) Dx:  Diabetes mellitus type 2 with atheros...  Ref Range & Units 16:59  (08/21/19) 6 mo ago  (01/27/19) 1 yr ago  (06/10/18) 1 yr ago  (04/01/18) 1 yr ago  (04/01/18) 1 yr ago  (10/15/17) 2 yr ago  (10/10/16)  Hemoglobin A1C 4.0 - 5.6 % 8.1Abnormal   7.4Abnormal   7.8High  R, CM  6.9Abnormal   6.9Abnormal  R  7.1High  R, CM  8.0High             Assessment & Plan:

## 2019-08-21 NOTE — Assessment & Plan Note (Addendum)
Exam c/w possible baker cyst rupture and/or meniscal tear - for sport med referral  I spent 32 minutes in preparing to see the patient by review of recent labs, imaging and procedures, obtaining and reviewing separately obtained history, communicating with the patient and family or caregiver, ordering medications, tests or procedures, and documenting clinical information in the EHR including the differential Dx, treatment, and any further evaluation and other management of acute pain right knee, DM and hTN

## 2019-08-21 NOTE — Assessment & Plan Note (Signed)
stable overall by history and exam, recent data reviewed with pt, and pt to continue medical treatment as before,  to f/u any worsening symptoms or concerns, for a1c, cont current med tx

## 2019-08-25 ENCOUNTER — Telehealth: Payer: Self-pay

## 2019-08-25 NOTE — Telephone Encounter (Signed)
Key: [id number]

## 2019-08-25 NOTE — Telephone Encounter (Signed)
PA started and the message stated that medication is covered under patients current plan.

## 2019-08-26 ENCOUNTER — Telehealth: Payer: Self-pay | Admitting: Internal Medicine

## 2019-08-26 DIAGNOSIS — E1151 Type 2 diabetes mellitus with diabetic peripheral angiopathy without gangrene: Secondary | ICD-10-CM

## 2019-08-26 MED ORDER — FARXIGA 10 MG PO TABS: 10.0000 mg | ORAL_TABLET | Freq: Every day | ORAL | 0 refills | Status: DC

## 2019-08-26 MED ORDER — JANUMET XR 100-1000 MG PO TB24: 1.0000 | ORAL_TABLET | Freq: Every day | ORAL | 0 refills | Status: DC

## 2019-08-26 NOTE — Telephone Encounter (Signed)
Rx for the farxiga and the janument. Pt spouse informed of same. Pt has been scheduled for a follow up appointment.

## 2019-08-26 NOTE — Telephone Encounter (Signed)
   Spouse calling,should patient be taking Janumet and Metformin? Please call

## 2019-11-23 ENCOUNTER — Other Ambulatory Visit: Payer: Self-pay | Admitting: Internal Medicine

## 2019-11-23 DIAGNOSIS — E1151 Type 2 diabetes mellitus with diabetic peripheral angiopathy without gangrene: Secondary | ICD-10-CM

## 2019-11-23 DIAGNOSIS — I2510 Atherosclerotic heart disease of native coronary artery without angina pectoris: Secondary | ICD-10-CM

## 2019-11-25 ENCOUNTER — Ambulatory Visit (INDEPENDENT_AMBULATORY_CARE_PROVIDER_SITE_OTHER): Payer: 59 | Admitting: Internal Medicine

## 2019-11-25 ENCOUNTER — Other Ambulatory Visit: Payer: Self-pay

## 2019-11-25 ENCOUNTER — Ambulatory Visit (INDEPENDENT_AMBULATORY_CARE_PROVIDER_SITE_OTHER): Payer: 59

## 2019-11-25 ENCOUNTER — Encounter: Payer: Self-pay | Admitting: Internal Medicine

## 2019-11-25 VITALS — BP 186/102 | HR 73 | Temp 97.8°F | Resp 16 | Ht 67.0 in | Wt 174.0 lb

## 2019-11-25 DIAGNOSIS — I2510 Atherosclerotic heart disease of native coronary artery without angina pectoris: Secondary | ICD-10-CM | POA: Diagnosis not present

## 2019-11-25 DIAGNOSIS — S8991XA Unspecified injury of right lower leg, initial encounter: Secondary | ICD-10-CM | POA: Diagnosis not present

## 2019-11-25 DIAGNOSIS — M7651 Patellar tendinitis, right knee: Secondary | ICD-10-CM | POA: Insufficient documentation

## 2019-11-25 DIAGNOSIS — I10 Essential (primary) hypertension: Secondary | ICD-10-CM | POA: Diagnosis not present

## 2019-11-25 DIAGNOSIS — I70209 Unspecified atherosclerosis of native arteries of extremities, unspecified extremity: Secondary | ICD-10-CM

## 2019-11-25 DIAGNOSIS — E1151 Type 2 diabetes mellitus with diabetic peripheral angiopathy without gangrene: Secondary | ICD-10-CM

## 2019-11-25 MED ORDER — EDARBYCLOR 40-12.5 MG PO TABS: 1.0000 | ORAL_TABLET | Freq: Every day | ORAL | 0 refills | Status: DC

## 2019-11-25 NOTE — Progress Notes (Signed)
Subjective:  Patient ID: Justin Galvan, male    DOB: June 14, 1960  Age: 59 y.o. MRN: [id number]  CC: Hypertension  This visit occurred during the SARS-CoV-2 public health emergency.  Safety protocols were in place, including screening questions prior to the visit, additional usage of staff PPE, and extensive cleaning of exam room while observing appropriate contact time as indicated for disinfecting solutions.    HPI MICHIO THIER presents for f/up -   1.  He injured his right knee 2 months ago and has recurrent discomfort in the knee.  He isolates the area of the inferior patella ligament.  He has not noticed any bruising or swelling and he has not taken anything for the discomfort.  2.  He tells me he is not taking plain Metformin because it caused nausea.  He has been taking Janumet and tolerates it well.  He thinks his blood sugars have been well controlled.  He denies any recent episodes of polys.  3.  His blood pressure has not been well controlled and he has had a few headaches recently.  He recently ran out of the ARB and has not been taking it.  He denies blurred vision, chest pain, shortness of breath, palpitations, edema, or fatigue.  He is very active and does not experience dyspnea on exertion.  Outpatient Medications Prior to Visit  Medication Sig Dispense Refill  . aspirin 81 MG EC tablet TAKE 1 TABLET (81 MG TOTAL) BY MOUTH DAILY. OVERDUE FOR FOLLOW=UP APPT MUST SEE PROVIDER FOR FUTURE REFILLS 90 tablet 1  . atorvastatin (LIPITOR) 80 MG tablet TAKE 1 TABLET (80 MG TOTAL) BY MOUTH DAILY AT 6 PM. 90 tablet 1  . dapagliflozin propanediol (FARXIGA) 10 MG TABS tablet Take 10 mg by mouth daily. 90 tablet 0  . glucose blood test strip Test up to two times daily 100 each 12  . Icosapent Ethyl (VASCEPA) 1 g CAPS Take 2 capsules (2 g total) by mouth 2 (two) times daily. 360 capsule 1  . JANUMET XR [phone] MG TB24 TAKE 1 TABLET BY MOUTH EVERY DAY 30 tablet 2  . PARoxetine (PAXIL)  40 MG tablet TAKE 1 TABLET BY MOUTH EVERY DAY 90 tablet 1  . tadalafil (CIALIS) 20 MG tablet Take 1 tablet (20 mg total) by mouth daily as needed for erectile dysfunction. 8 tablet 0  . nitroGLYCERIN (NITROSTAT) 0.4 MG SL tablet Place 1 tablet (0.4 mg total) under the tongue every 5 (five) minutes as needed for chest pain. 25 tablet 6  . metFORMIN (GLUCOPHAGE-XR) 500 MG 24 hr tablet Take 2 tablets (1,000 mg total) by mouth daily with breakfast. 180 tablet 3  . olmesartan (BENICAR) 40 MG tablet Take 1 tablet (40 mg total) by mouth daily. (Patient not taking: Reported on 11/25/2019) 90 tablet 3   No facility-administered medications prior to visit.    ROS Review of Systems  Constitutional: Negative for appetite change, diaphoresis, fatigue and unexpected weight change.  HENT: Negative.   Eyes: Negative for visual disturbance.  Respiratory: Negative for cough, chest tightness, shortness of breath and wheezing.   Cardiovascular: Negative for chest pain, palpitations and leg swelling.  Gastrointestinal: Positive for nausea. Negative for abdominal pain, constipation, diarrhea and vomiting.  Endocrine: Negative.  Negative for polydipsia, polyphagia and polyuria.  Genitourinary: Negative.  Negative for difficulty urinating, dysuria, hematuria and urgency.  Musculoskeletal: Positive for arthralgias. Negative for back pain, myalgias and neck pain.  Skin: Negative.  Negative for color change and pallor.  Neurological: Positive for headaches. Negative for dizziness, weakness, light-headedness and numbness.  Hematological: Negative for adenopathy. Does not bruise/bleed easily.  Psychiatric/Behavioral: Negative.     Objective:  BP (!) 186/102 (BP Location: Left Arm, Patient Position: Sitting, Cuff Size: Normal)   Pulse 73   Temp 97.8 F (36.6 C) (Oral)   Resp 16   Ht 5\' 7"  (1.702 m)   Wt 174 lb (78.9 kg)   SpO2 96%   BMI 27.25 kg/m   BP Readings from Last 3 Encounters:  11/26/19 132/88    11/25/19 (!) 186/102  08/21/19 (!) 180/100    Wt Readings from Last 3 Encounters:  11/26/19 179 lb (81.2 kg)  11/25/19 174 lb (78.9 kg)  08/21/19 174 lb (78.9 kg)    Physical Exam Vitals reviewed.  Constitutional:      Appearance: Normal appearance.  HENT:     Nose: Nose normal.     Mouth/Throat:     Mouth: Mucous membranes are moist.  Eyes:     General: No scleral icterus.    Conjunctiva/sclera: Conjunctivae normal.  Cardiovascular:     Rate and Rhythm: Normal rate and regular rhythm.     Heart sounds: No murmur heard.  Gallop present.   Pulmonary:     Effort: Pulmonary effort is normal.     Breath sounds: No stridor. No wheezing, rhonchi or rales.  Abdominal:     General: Abdomen is flat.     Palpations: There is no mass.     Tenderness: There is no abdominal tenderness. There is no guarding.  Musculoskeletal:        General: Normal range of motion.     Cervical back: Neck supple.     Right lower leg: Deformity (free floating body lateral to the patellar ligament) and tenderness (over the patellar ligament) present. No bony tenderness. No edema.     Left lower leg: Pitting Edema present.  Lymphadenopathy:     Cervical: No cervical adenopathy.  Skin:    General: Skin is warm and dry.     Coloration: Skin is not pale.  Neurological:     General: No focal deficit present.     Mental Status: He is alert and oriented to person, place, and time. Mental status is at baseline.  Psychiatric:        Mood and Affect: Mood normal.        Behavior: Behavior normal.     Lab Results  Component Value Date   WBC 7.8 01/27/2019   HGB 14.3 01/27/2019   HCT 42.1 01/27/2019   PLT 270.0 01/27/2019   GLUCOSE 164 (H) 11/25/2019   CHOL 131 01/27/2019   TRIG 126.0 01/27/2019   HDL 38.80 (L) 01/27/2019   LDLDIRECT 108.0 10/15/2017   LDLCALC 67 01/27/2019   ALT 19 01/27/2019   AST 16 01/27/2019   NA 140 11/25/2019   K 4.0 11/25/2019   CL 103 11/25/2019   CREATININE 0.91  11/25/2019   BUN 18 11/25/2019   CO2 26 11/25/2019   TSH 2.81 01/27/2019   PSA 1.87 01/27/2019   INR 1.04 04/23/2015   HGBA1C 7.9 (H) 11/25/2019   MICROALBUR 0.9 01/27/2019    MYOCARDIAL PERFUSION IMAGING  Result Date: 02/03/2019  The left ventricular ejection fraction is moderately decreased (30-44%).  Nuclear stress EF: 37%.  There was no ST segment deviation noted during stress.  No T wave inversion was noted during stress.  Defect 1: There is a large defect of severe severity present  in the basal inferoseptal, basal inferior, basal inferolateral, mid inferoseptal, mid inferior, mid inferolateral, apical septal and apex location.  Findings consistent with prior myocardial infarction with peri-infarct ischemia.  This is an intermediate risk study.  At baseline, ECG shows pathologic Q wave in 2 or more leads  Large size, severe severity inferoseptal, inferior and inferolateral perfusion defect with minimal reversibility (SDS 3) - suggestive of infarct with minimal peri-infarct ischemia. LVEF 37% with inferior, inferoseptal and basal to mid inferolateral hypokinesis. This is an intermediate risk study.    CLINICAL DATA:  Right knee pain for 2 months after an injury  EXAM: RIGHT KNEE - COMPLETE 4+ VIEW  COMPARISON:  None.  FINDINGS: No evidence of fracture, dislocation, or joint effusion. Minor marginal spurring without joint narrowing.  IMPRESSION: 1. Negative for fracture. 2. Minor marginal spurring without joint narrowing.   Electronically Signed   By: Marnee Spring M.D.   On: 11/25/2019 08:43  Assessment & Plan:   Torri was seen today for hypertension.  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  Will continue the DPP 4 inhibitor, Metformin, and SGLT2 inhibitor. -     BASIC METABOLIC PANEL WITH GFR; Future -      Hemoglobin A1c; Future -     Ambulatory referral to Ophthalmology -     Azilsartan-Chlorthalidone (EDARBYCLOR) 40-12.5 MG TABS; Take 1 tablet by mouth daily. -     Hemoglobin A1c -     BASIC METABOLIC PANEL WITH GFR  Right knee injury, initial encounter - Based on his symptoms, exam, and plain films I am concerned he may have developed patellar tendinitis.  I have asked him to see sports medicine. -     DG Knee Complete 4 Views Right; Future -     Ambulatory referral to Sports Medicine  Essential hypertension- His blood pressure is not adequately well controlled and he is symptomatic.  I have asked him to restart the ARB and will add chlorthalidone as well. -     Azilsartan-Chlorthalidone (EDARBYCLOR) 40-12.5 MG TABS; Take 1 tablet by mouth daily.  Atherosclerosis of native coronary artery of native heart without angina pectoris- He has had no symptoms concerning for ischemia.  Will continue to work on risk factor modifications. -     Azilsartan-Chlorthalidone (EDARBYCLOR) 40-12.5 MG TABS; Take 1 tablet by mouth daily.  Patellar tendonitis of right knee -     Ambulatory referral to Sports Medicine   I have discontinued Casimiro Needle C. Goodner's olmesartan and metFORMIN. I am also having him start on Edarbyclor. Additionally, I am having him maintain his glucose blood, tadalafil, Vascepa, atorvastatin, nitroGLYCERIN, PARoxetine, Farxiga, aspirin, and Janumet XR.  Meds ordered this encounter  Medications  . Azilsartan-Chlorthalidone (EDARBYCLOR) 40-12.5 MG TABS    Sig: Take 1 tablet by mouth daily.    Dispense:  90 tablet    Refill:  0   I spent 50 minutes in preparing to see the patient by review of recent labs, imaging and procedures, obtaining and reviewing separately obtained history, communicating with the patient and family or caregiver, ordering medications, tests or procedures, and documenting clinical information in the EHR including the differential Dx, treatment, and any further  evaluation and other management of 1. 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 Right knee injury, initial encounter 3. Essential hypertension 4.  Atherosclerosis of native coronary artery of native heart without angina pectoris 5. Patellar tendonitis of right knee      Follow-up: Return in about 4 weeks (around 12/23/2019).  Sanda Linger, MD

## 2019-11-25 NOTE — Patient Instructions (Signed)

## 2019-11-26 ENCOUNTER — Encounter: Payer: Self-pay | Admitting: Family Medicine

## 2019-11-26 ENCOUNTER — Ambulatory Visit: Payer: Self-pay

## 2019-11-26 ENCOUNTER — Ambulatory Visit (INDEPENDENT_AMBULATORY_CARE_PROVIDER_SITE_OTHER): Payer: 59 | Admitting: Family Medicine

## 2019-11-26 VITALS — BP 132/88 | HR 81 | Ht 67.0 in | Wt 179.0 lb

## 2019-11-26 DIAGNOSIS — G8929 Other chronic pain: Secondary | ICD-10-CM

## 2019-11-26 DIAGNOSIS — M7140 Calcium deposit in bursa, unspecified site: Secondary | ICD-10-CM | POA: Insufficient documentation

## 2019-11-26 DIAGNOSIS — M7651 Patellar tendinitis, right knee: Secondary | ICD-10-CM | POA: Diagnosis not present

## 2019-11-26 DIAGNOSIS — M25561 Pain in right knee: Secondary | ICD-10-CM

## 2019-11-26 LAB — BASIC METABOLIC PANEL WITH GFR
BUN: 18 mg/dL (ref 7–25)
CO2: 26 mmol/L (ref 20–32)
Calcium: 9.6 mg/dL (ref 8.6–10.3)
Chloride: 103 mmol/L (ref 98–110)
Creat: 0.91 mg/dL (ref 0.70–1.25)
GFR, Est African American: 105 mL/min/{1.73_m2} (ref >=60)
GFR, Est Non African American: 91 mL/min/{1.73_m2} (ref >=60)
Glucose, Bld: 164 mg/dL — ABNORMAL HIGH (ref 65–99)
Potassium: 4 mmol/L (ref 3.5–5.3)
Sodium: 140 mmol/L (ref 135–146)

## 2019-11-26 LAB — HEMOGLOBIN A1C
Hgb A1c MFr Bld: 7.9 % of total Hgb — ABNORMAL HIGH (ref <5.7)
Mean Plasma Glucose: 180 (calc)
eAG (mmol/L): 10 (calc)

## 2019-11-26 NOTE — Patient Instructions (Signed)
Good to see you Pennsaid 2 times a day small amount Voltaren gel after you are out of pennsaid Heat 10 minutes with massage followed by ice for 10 mins 1-2 times day Avoid direct impact  See me again in 6 weeks

## 2019-11-26 NOTE — Assessment & Plan Note (Signed)
Patient is more of a calcific what appears to be a bursitis.  Possibly even it was a hemorrhagic initially.  The seem to have more calcific changes at this point.  Freely movable but does have some soft tissue inflammation noted.  Discussed with patient about knee brace which was given today to try to unload on the area.  Discussed heat and massage followed by ice topical anti-inflammatories given as well.  Increase activity slowly.  Follow-up again 4 to 6 weeks and ultrasound again at that time to make sure improvement if continuing to have trouble may need surgical intervention.  May consider the possibility of uric acid level as well

## 2019-11-26 NOTE — Progress Notes (Signed)
[facility] [address] [place] [address] Phone: [phone] Subjective:   Justin Galvan, am serving as a scribe for Dr. Antoine Primas. This visit occurred during the SARS-CoV-2 public health emergency.  Safety protocols were in place, including screening questions prior to the visit, additional usage of staff PPE, and extensive cleaning of exam room while observing appropriate contact time as indicated for disinfecting solutions.   I'm seeing this patient by the request  of:  Etta Grandchild, MD  CC: Right knee pain  UTM:[id number]  Justin Galvan is a 59 y.o. male coming in with complaint of right knee pain. Pain over patellar tendon. Pain with pressure on knee. Patient fell on knee 2 weeks ago but was having pain 2 months prior. Patient not doing anything for pain.   Xray 11/25/2019 IMPRESSION: 1. Negative for fracture. 2. Minor marginal spurring without joint narrowing.      Past Medical History:  Diagnosis Date  . Anginal pain (HCC)   . Arthritis    "fingers" (04/22/2015)  . BPH (benign prostatic hypertrophy)   . CAD, NATIVE VESSEL, Hx. stent-RCA 1997; 2 Taxusprox & mid RCA 06/2003, cath 05/06/13 100% RCA, mid LAD disease 05/03/2009   Qualifier: Diagnosis of  By: Denyse Amass CMA, Carol    . Depression   . 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 DM2 (diabetes mellitus, type 2) (HCC)   . Erectile dysfunction due to arterial insufficiency 10/10/2016  . Essential hypertension 08/19/2008  . HLD (hyperlipidemia)   . HTN (hypertension)   . Hypertriglyceridemia 05/03/2009       . Kidney stones   . MI (myocardial infarction) (HCC) 1997; ~ 2002; 04/2013  . 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 S/P coronary artery stent placement -mid LAD, 05/06/13 Promus Premier DES 05/07/2013   Past Surgical History:  Procedure Laterality Date  . CARDIAC CATHETERIZATION N/A 04/26/2015   Procedure: Left Heart Cath and  Coronary Angiography;  Surgeon: Kathleene Hazel, MD;  Location: [facility] INVASIVE CV LAB;  Service: Cardiovascular;  Laterality: N/A;  . CORONARY ANGIOPLASTY WITH STENT PLACEMENT  1997 - 04/2013   "got several stents; may 8 or 9"  . CORONARY ANGIOPLASTY WITH STENT PLACEMENT  05/06/2013   DES/LAD               DR Clifton James  . LEFT HEART CATHETERIZATION WITH CORONARY ANGIOGRAM N/A 05/06/2013   Procedure: LEFT HEART CATHETERIZATION WITH CORONARY ANGIOGRAM;  Surgeon: Kathleene Hazel, MD;  Location[facility] C[facility];  Service: Cardiovascular;  Laterality: N/A;   Social History   Socioeconomic History  . Marital status: Married    Spouse name: Not on file  . Number of children: 2  . Years of education: Not on file  . Highest education level: Not on file  Occupational History  . Occupation: Surveyor, minerals  Tobacco Use  . Smoking status: Former Smoker    Packs/day: 2.75    Years: 22.00    Pack years: 60.50    Types: Cigarettes    Quit date: 09/07/1996    Years since quitting: 23.2  . Smokeless tobacco: Never Used  Vaping Use  . Vaping Use: Never used  Substance and Sexual Activity  . Alcohol use: Yes    Comment: 04/22/2015 "might drink a 6 pack of beer/year"  . Drug use: No  . Sexual activity: Not Currently  Other Topics Concern  . Not on file  Social History Narrative  .  Not on file   Social Determinants of Health   Financial Resource Strain:   . Difficulty of Paying Living Expenses:   Food Insecurity:   . Worried About Programme researcher, broadcasting/film/video in the Last Year:   . Barista in the Last Year:   Transportation Needs:   . Freight forwarder (Medical):   Marland Kitchen Lack of Transportation (Non-Medical):   Physical Activity:   . Days of Exercise per Week:   . Minutes of Exercise per Session:   Stress:   . Feeling of Stress :   Social Connections:   . Frequency of Communication with Friends and Family:   . Frequency of Social Gatherings with Friends and Family:   . Attends  Religious Services:   . Active Member of Clubs or Organizations:   . Attends Banker Meetings:   Marland Kitchen Marital Status:    No Known Allergies Family History  Problem Relation Age of Onset  . Heart disease Mother   . Hypertension Other   . Cancer Neg Hx   . Diabetes Neg Hx   . Early death Neg Hx   . Hyperlipidemia Neg Hx   . Kidney disease Neg Hx   . Stroke Neg Hx   . Alcohol abuse Neg Hx     Current Outpatient Medications (Endocrine & Metabolic):  .  dapagliflozin propanediol (FARXIGA) 10 MG TABS tablet, Take 10 mg by mouth daily. Marland Kitchen  JANUMET XR [phone] MG TB24, TAKE 1 TABLET BY MOUTH EVERY DAY  Current Outpatient Medications (Cardiovascular):  .  atorvastatin (LIPITOR) 80 MG tablet, TAKE 1 TABLET (80 MG TOTAL) BY MOUTH DAILY AT 6 PM. .  Azilsartan-Chlorthalidone (EDARBYCLOR) 40-12.5 MG TABS, Take 1 tablet by mouth daily. [place] Ethyl (VASCEPA) 1 g CAPS, Take 2 capsules (2 g total) by mouth 2 (two) times daily. .  tadalafil (CIALIS) 20 MG tablet, Take 1 tablet (20 mg total) by mouth daily as needed for erectile dysfunction. .  nitroGLYCERIN (NITROSTAT) 0.4 MG SL tablet, Place 1 tablet (0.4 mg total) under the tongue every 5 (five) minutes as needed for chest pain.   Current Outpatient Medications (Analgesics):  .  aspirin 81 MG EC tablet, TAKE 1 TABLET (81 MG TOTAL) BY MOUTH DAILY. OVERDUE FOR FOLLOW=UP APPT MUST SEE PROVIDER FOR FUTURE REFILLS   Current Outpatient Medications (Other):  .  glucose blood test strip, Test up to two times daily .  PARoxetine (PAXIL) 40 MG tablet, TAKE 1 TABLET BY MOUTH EVERY DAY   Reviewed prior external information including notes and imaging from  primary care provider As well as notes that were available from care everywhere and other healthcare systems.  Past medical history, social, surgical and family history all reviewed in electronic medical record.  No pertanent information unless stated regarding to the chief  complaint.   Review of Systems:  No headache, visual changes, nausea, vomiting, diarrhea, constipation, dizziness, abdominal pain, skin rash, fevers, chills, night sweats, weight loss, swollen lymph nodes, body aches, joint swelling, chest pain, shortness of breath, mood changes. POSITIVE muscle aches  Objective  Blood pressure 132/88, pulse 81, height 5\' 7"  (1.702 m), weight 179 lb (81.2 kg), SpO2 99 %.   General: No apparent distress alert and oriented x3 mood and affect normal, dressed appropriately.  HEENT: Pupils equal, extraocular movements intact  Respiratory: Patient's speak in full sentences and does not appear short of breath  Cardiovascular: No lower extremity edema, non tender, no erythema  Neuro:  Cranial nerves II through XII are intact, neurovascularly intact in all extremities with 2+ DTRs and 2+ pulses.  Gait normal with good balance and coordination.  MSK:  Non tender with full range of motion and good stability and symmetric strength and tone of shoulders, elbows, wrist, hip, and ankles bilaterally.  Patient's right knee does have some very mild varus deformity of the knee.  Patient does have a little calcific freely movable mass noted that seems to be in the soft tissue.  Tender to palpation.  No abnormal vascularity noted  Limited musculoskeletal ultrasound was performed and interpreted by Judi Saa  Limited ultrasound of patient's knee shows that there is calcific soft tissue mass noted in the subcutaneous.  This is superficial to the patellar tendon.  Seems to have calcific changes noted.  Mild hypoechoic changes surrounding the area with mild increase in Doppler flow.  No neovascularization noted in the area. Impression: Calcific bursitis versus potential uric acid deposits    Impression and Recommendations:     The above documentation has been reviewed and is accurate and complete Judi Saa, DO       Note: This dictation was prepared with Dragon  dictation along with smaller phrase technology. Any transcriptional errors that result from this process are unintentional.

## 2019-11-27 ENCOUNTER — Encounter: Payer: Self-pay | Admitting: Internal Medicine

## 2019-12-01 ENCOUNTER — Other Ambulatory Visit: Payer: Self-pay | Admitting: Internal Medicine

## 2019-12-01 DIAGNOSIS — E785 Hyperlipidemia, unspecified: Secondary | ICD-10-CM

## 2019-12-01 DIAGNOSIS — I10 Essential (primary) hypertension: Secondary | ICD-10-CM

## 2019-12-01 DIAGNOSIS — I70209 Unspecified atherosclerosis of native arteries of extremities, unspecified extremity: Secondary | ICD-10-CM

## 2019-12-01 DIAGNOSIS — I2510 Atherosclerotic heart disease of native coronary artery without angina pectoris: Secondary | ICD-10-CM

## 2019-12-27 ENCOUNTER — Other Ambulatory Visit: Payer: Self-pay | Admitting: Internal Medicine

## 2019-12-27 DIAGNOSIS — I70209 Unspecified atherosclerosis of native arteries of extremities, unspecified extremity: Secondary | ICD-10-CM

## 2020-01-22 ENCOUNTER — Other Ambulatory Visit: Payer: Self-pay | Admitting: Internal Medicine

## 2020-01-22 DIAGNOSIS — I2510 Atherosclerotic heart disease of native coronary artery without angina pectoris: Secondary | ICD-10-CM

## 2020-01-24 ENCOUNTER — Other Ambulatory Visit: Payer: Self-pay | Admitting: Internal Medicine

## 2020-01-24 DIAGNOSIS — F418 Other specified anxiety disorders: Secondary | ICD-10-CM

## 2020-01-24 MED ORDER — PAROXETINE HCL 40 MG PO TABS: 40.0000 mg | ORAL_TABLET | Freq: Every day | ORAL | 5 refills | Status: DC

## 2020-02-25 ENCOUNTER — Other Ambulatory Visit: Payer: Self-pay | Admitting: Internal Medicine

## 2020-02-25 DIAGNOSIS — I2510 Atherosclerotic heart disease of native coronary artery without angina pectoris: Secondary | ICD-10-CM

## 2020-02-27 ENCOUNTER — Other Ambulatory Visit: Payer: Self-pay | Admitting: Internal Medicine

## 2020-02-27 DIAGNOSIS — E1151 Type 2 diabetes mellitus with diabetic peripheral angiopathy without gangrene: Secondary | ICD-10-CM

## 2020-03-10 ENCOUNTER — Other Ambulatory Visit: Payer: Self-pay | Admitting: Internal Medicine

## 2020-03-10 DIAGNOSIS — I2510 Atherosclerotic heart disease of native coronary artery without angina pectoris: Secondary | ICD-10-CM

## 2020-04-02 ENCOUNTER — Other Ambulatory Visit: Payer: Self-pay | Admitting: Internal Medicine

## 2020-04-02 DIAGNOSIS — E1151 Type 2 diabetes mellitus with diabetic peripheral angiopathy without gangrene: Secondary | ICD-10-CM

## 2020-05-03 ENCOUNTER — Telehealth: Payer: Self-pay | Admitting: Internal Medicine

## 2020-05-03 NOTE — Telephone Encounter (Signed)
EDARBYCLOR 40-12.5 MG TABS [id number] - [place], [place] - [address] Phone:  [phone]  Fax:  [phone]     Patient wife states that the [facility] has informed [facility] that this medication is on back order and they are unable to get it, as well she checked different pharmacies near by and they do not have it either. Wondering if something else can be sent in, in place of this medication. Patient is out of medication.

## 2020-05-04 ENCOUNTER — Other Ambulatory Visit: Payer: Self-pay | Admitting: Internal Medicine

## 2020-05-04 DIAGNOSIS — I10 Essential (primary) hypertension: Secondary | ICD-10-CM

## 2020-05-04 DIAGNOSIS — E1151 Type 2 diabetes mellitus with diabetic peripheral angiopathy without gangrene: Secondary | ICD-10-CM

## 2020-05-04 DIAGNOSIS — I2510 Atherosclerotic heart disease of native coronary artery without angina pectoris: Secondary | ICD-10-CM

## 2020-05-04 MED ORDER — IRBESARTAN 300 MG PO TABS: 300.0000 mg | ORAL_TABLET | Freq: Every day | ORAL | 1 refills | Status: DC

## 2020-05-04 MED ORDER — INDAPAMIDE 1.25 MG PO TABS: 1.2500 mg | ORAL_TABLET | Freq: Every day | ORAL | 1 refills | Status: DC

## 2020-06-06 ENCOUNTER — Telehealth: Payer: Self-pay | Admitting: Internal Medicine

## 2020-06-06 ENCOUNTER — Other Ambulatory Visit: Payer: Self-pay | Admitting: Internal Medicine

## 2020-06-06 NOTE — Telephone Encounter (Signed)
Patients wife called and said that the patient is wondering if he would be able to go back on EDARBYCLOR 40-12.5 MG TABS but she said that it is $260 and was wondering if we had a coupon. She said that the patient has told her that this medication works better for him. Please advise.    She can be reached at [phone].

## 2020-06-07 ENCOUNTER — Telehealth: Payer: Self-pay | Admitting: Internal Medicine

## 2020-06-07 NOTE — Telephone Encounter (Signed)
[facility] called and was wondering if a fax was received for a PA for EDARBYCLOR 40-12.5 MG TABS. It is not on the patients current medication list. Please advise.

## 2020-06-07 NOTE — Telephone Encounter (Signed)
He is overdue for a f/up appt with me with an A1C  TJ

## 2020-06-07 NOTE — Telephone Encounter (Signed)
PA was received yesterday but not completed due to requested med being discontinued by PCP. Pharmacy has been informed.

## 2020-06-08 NOTE — Telephone Encounter (Signed)
Pt's wife stated they would have to check schedules to make an OV. 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 Will call back to schedule around that time frame.

## 2020-06-17 ENCOUNTER — Telehealth: Payer: Self-pay | Admitting: Internal Medicine

## 2020-06-17 DIAGNOSIS — E785 Hyperlipidemia, unspecified: Secondary | ICD-10-CM

## 2020-06-17 DIAGNOSIS — I2510 Atherosclerotic heart disease of native coronary artery without angina pectoris: Secondary | ICD-10-CM

## 2020-06-17 NOTE — Telephone Encounter (Signed)
Patients wife calling to get a short supply until the 17th when he has an appointment

## 2020-06-18 ENCOUNTER — Other Ambulatory Visit: Payer: Self-pay | Admitting: Internal Medicine

## 2020-06-18 DIAGNOSIS — I2510 Atherosclerotic heart disease of native coronary artery without angina pectoris: Secondary | ICD-10-CM

## 2020-06-18 DIAGNOSIS — E785 Hyperlipidemia, unspecified: Secondary | ICD-10-CM

## 2020-06-18 MED ORDER — ATORVASTATIN CALCIUM 80 MG PO TABS: 80.0000 mg | ORAL_TABLET | Freq: Every day | ORAL | 0 refills | Status: DC

## 2020-07-07 ENCOUNTER — Encounter: Payer: Self-pay | Admitting: Internal Medicine

## 2020-07-07 ENCOUNTER — Other Ambulatory Visit: Payer: Self-pay

## 2020-07-07 ENCOUNTER — Ambulatory Visit (INDEPENDENT_AMBULATORY_CARE_PROVIDER_SITE_OTHER): Payer: 59 | Admitting: Internal Medicine

## 2020-07-07 VITALS — BP 166/86 | HR 71 | Temp 98.4°F | Ht 67.0 in | Wt 174.0 lb

## 2020-07-07 DIAGNOSIS — I10 Essential (primary) hypertension: Secondary | ICD-10-CM | POA: Diagnosis not present

## 2020-07-07 DIAGNOSIS — I70209 Unspecified atherosclerosis of native arteries of extremities, unspecified extremity: Secondary | ICD-10-CM

## 2020-07-07 DIAGNOSIS — E781 Pure hyperglyceridemia: Secondary | ICD-10-CM

## 2020-07-07 DIAGNOSIS — E1151 Type 2 diabetes mellitus with diabetic peripheral angiopathy without gangrene: Secondary | ICD-10-CM

## 2020-07-07 DIAGNOSIS — E785 Hyperlipidemia, unspecified: Secondary | ICD-10-CM

## 2020-07-07 DIAGNOSIS — N138 Other obstructive and reflux uropathy: Secondary | ICD-10-CM

## 2020-07-07 DIAGNOSIS — Z0000 Encounter for general adult medical examination without abnormal findings: Secondary | ICD-10-CM

## 2020-07-07 DIAGNOSIS — Z125 Encounter for screening for malignant neoplasm of prostate: Secondary | ICD-10-CM

## 2020-07-07 DIAGNOSIS — E559 Vitamin D deficiency, unspecified: Secondary | ICD-10-CM | POA: Diagnosis not present

## 2020-07-07 DIAGNOSIS — I2510 Atherosclerotic heart disease of native coronary artery without angina pectoris: Secondary | ICD-10-CM

## 2020-07-07 DIAGNOSIS — N401 Enlarged prostate with lower urinary tract symptoms: Secondary | ICD-10-CM

## 2020-07-07 LAB — CBC WITH DIFFERENTIAL/PLATELET
Basophils Absolute: 0.1 10*3/uL (ref 0.0–0.1)
Basophils Relative: 0.9 % (ref 0.0–3.0)
Eosinophils Absolute: 0.3 10*3/uL (ref 0.0–0.7)
Eosinophils Relative: 3.7 % (ref 0.0–5.0)
HCT: 45.3 % (ref 39.0–52.0)
Hemoglobin: 15.4 g/dL (ref 13.0–17.0)
Lymphocytes Relative: 23.6 % (ref 12.0–46.0)
Lymphs Abs: 2 10*3/uL (ref 0.7–4.0)
MCHC: 34.1 g/dL (ref 30.0–36.0)
MCV: 86.4 fl (ref 78.0–100.0)
Monocytes Absolute: 0.6 10*3/uL (ref 0.1–1.0)
Monocytes Relative: 7.5 % (ref 3.0–12.0)
Neutro Abs: 5.4 10*3/uL (ref 1.4–7.7)
Neutrophils Relative %: 64.3 % (ref 43.0–77.0)
Platelets: 273 10*3/uL (ref 150.0–400.0)
RBC: 5.24 Mil/uL (ref 4.22–5.81)
RDW: 13.9 % (ref 11.5–15.5)
WBC: 8.4 10*3/uL (ref 4.0–10.5)

## 2020-07-07 LAB — LIPID PANEL
Cholesterol: 158 mg/dL (ref 0–200)
HDL: 39.3 mg/dL (ref >39.00)
NonHDL: 119.07
Total CHOL/HDL Ratio: 4
Triglycerides: 248 mg/dL — ABNORMAL HIGH (ref 0.0–149.0)
VLDL: 49.6 mg/dL — ABNORMAL HIGH (ref 0.0–40.0)

## 2020-07-07 LAB — HEPATIC FUNCTION PANEL
ALT: 19 U/L (ref 0–53)
AST: 16 U/L (ref 0–37)
Albumin: 4.5 g/dL (ref 3.5–5.2)
Alkaline Phosphatase: 64 U/L (ref 39–117)
Bilirubin, Direct: 0.1 mg/dL (ref 0.0–0.3)
Total Bilirubin: 0.6 mg/dL (ref 0.2–1.2)
Total Protein: 7 g/dL (ref 6.0–8.3)

## 2020-07-07 LAB — VITAMIN D 25 HYDROXY (VIT D DEFICIENCY, FRACTURES): VITD: 20.56 ng/mL — ABNORMAL LOW (ref 30.00–100.00)

## 2020-07-07 LAB — BASIC METABOLIC PANEL
BUN: 16 mg/dL (ref 6–23)
CO2: 30 mEq/L (ref 19–32)
Calcium: 9.7 mg/dL (ref 8.4–10.5)
Chloride: 100 mEq/L (ref 96–112)
Creatinine, Ser: 0.87 mg/dL (ref 0.40–1.50)
GFR: 92.74 mL/min (ref >60.00)
Glucose, Bld: 149 mg/dL — ABNORMAL HIGH (ref 70–99)
Potassium: 4.1 mEq/L (ref 3.5–5.1)
Sodium: 139 mEq/L (ref 135–145)

## 2020-07-07 LAB — URINALYSIS, ROUTINE W REFLEX MICROSCOPIC
Bilirubin Urine: NEGATIVE
Hgb urine dipstick: NEGATIVE
Ketones, ur: NEGATIVE
Leukocytes,Ua: NEGATIVE
Nitrite: NEGATIVE
RBC / HPF: NONE SEEN (ref 0–?)
Specific Gravity, Urine: 1.02 (ref 1.000–1.030)
Total Protein, Urine: NEGATIVE
Urine Glucose: >=1000 — AB
Urobilinogen, UA: 0.2 (ref 0.0–1.0)
WBC, UA: NONE SEEN (ref 0–?)
pH: 5.5 (ref 5.0–8.0)

## 2020-07-07 LAB — LDL CHOLESTEROL, DIRECT: Direct LDL: 92 mg/dL

## 2020-07-07 LAB — PSA: PSA: 2.06 ng/mL (ref 0.10–4.00)

## 2020-07-07 LAB — TSH: TSH: 1.71 u[IU]/mL (ref 0.35–4.50)

## 2020-07-07 LAB — HEMOGLOBIN A1C: Hgb A1c MFr Bld: 7.9 % — ABNORMAL HIGH (ref 4.6–6.5)

## 2020-07-07 LAB — MICROALBUMIN / CREATININE URINE RATIO
Creatinine,U: 73 mg/dL
Microalb Creat Ratio: 1.5 mg/g (ref 0.0–30.0)
Microalb, Ur: 1.1 mg/dL (ref 0.0–1.9)

## 2020-07-07 MED ORDER — TERAZOSIN HCL 2 MG PO CAPS
2.0000 mg | ORAL_CAPSULE | Freq: Every day | ORAL | 1 refills | Status: DC
Start: 2020-07-07 — End: 2020-12-29

## 2020-07-07 MED ORDER — NEBIVOLOL HCL 5 MG PO TABS: 5.0000 mg | ORAL_TABLET | Freq: Every day | ORAL | 0 refills | Status: DC

## 2020-07-07 NOTE — Progress Notes (Signed)
Subjective:  Patient ID: Justin Galvan, male    DOB: 1960/11/04  Age: 60 y.o. MRN: [id number]  CC: Annual Exam, Hypertension, Hyperlipidemia, and Diabetes  This visit occurred during the SARS-CoV-2 public health emergency.  Safety protocols were in place, including screening questions prior to the visit, additional usage of staff PPE, and extensive cleaning of exam room while observing appropriate contact time as indicated for disinfecting solutions.    HPI KARLO GOEDEN presents for a CPX and f/up.  He is active and denies any recent episodes of chest pain, shortness of breath, palpitations, edema, or fatigue. Based on prescription refills it looks like he would not have been compliant with some of his medications.  He does not monitor his blood pressure or his blood sugar.  Since I last saw him he has developed difficulty urinating.   Outpatient Medications Prior to Visit  Medication Sig Dispense Refill   atorvastatin (LIPITOR) 80 MG tablet Take 1 tablet (80 mg total) by mouth daily. at 6pm 30 tablet 0   glucose blood test strip Test up to two times daily 100 each 12   indapamide (LOZOL) 1.25 MG tablet Take 1 tablet (1.25 mg total) by mouth daily. 90 tablet 1   irbesartan (AVAPRO) 300 MG tablet Take 1 tablet (300 mg total) by mouth daily. 90 tablet 1   PARoxetine (PAXIL) 40 MG tablet Take 1 tablet (40 mg total) by mouth daily. 30 tablet 5   tadalafil (CIALIS) 20 MG tablet Take 1 tablet (20 mg total) by mouth daily as needed for erectile dysfunction. 8 tablet 0   aspirin 81 MG EC tablet Take 1 tablet (81 mg total) by mouth daily. 90 tablet 1   FARXIGA 10 MG TABS tablet TAKE 10 MG BY MOUTH DAILY. 30 tablet 2   Icosapent Ethyl (VASCEPA) 1 g CAPS Take 2 capsules (2 g total) by mouth 2 (two) times daily. 360 capsule 1   JANUMET XR [phone] MG TB24 TAKE 1 TABLET BY MOUTH EVERY DAY 30 tablet 2   nitroGLYCERIN (NITROSTAT) 0.4 MG SL tablet Place 1 tablet (0.4 mg total) under the  tongue every 5 (five) minutes as needed for chest pain. 25 tablet 6   No facility-administered medications prior to visit.    ROS Review of Systems  Constitutional: Negative.  Negative for appetite change, chills, diaphoresis, fatigue and fever.  HENT: Negative.   Eyes: Negative for visual disturbance.  Respiratory: Negative for cough, chest tightness, shortness of breath and wheezing.   Cardiovascular: Negative for chest pain, palpitations and leg swelling.  Gastrointestinal: Negative for abdominal pain, constipation, diarrhea, nausea and vomiting.  Endocrine: Negative.  Negative for polydipsia, polyphagia and polyuria.  Genitourinary: Positive for difficulty urinating. Negative for dysuria, frequency, scrotal swelling, testicular pain and urgency.       ++ weak urine stream  Musculoskeletal: Negative.  Negative for back pain.  Skin: Negative.  Negative for color change and pallor.  Neurological: Positive for dizziness. Negative for speech difficulty, weakness, light-headedness and numbness.  Hematological: Negative for adenopathy. Does not bruise/bleed easily.  Psychiatric/Behavioral: Negative.     Objective:  BP (!) 166/86 (BP Location: Left Arm, Patient Position: Sitting, Cuff Size: Large)    Pulse 71    Temp 98.4 F (36.9 C) (Oral)    Ht 5\' 7"  (1.702 m)    Wt 174 lb (78.9 kg)    SpO2 96%    BMI 27.25 kg/m   BP Readings from Last 3 Encounters:  07/07/20 07/09/20)  166/86  11/26/19 132/88  11/25/19 (!) 186/102    Wt Readings from Last 3 Encounters:  07/07/20 174 lb (78.9 kg)  11/26/19 179 lb (81.2 kg)  11/25/19 174 lb (78.9 kg)    Physical Exam Vitals reviewed.  Constitutional:      Appearance: Normal appearance.  HENT:     Nose: Nose normal.     Mouth/Throat:     Mouth: Mucous membranes are moist.  Eyes:     General: No scleral icterus.    Conjunctiva/sclera: Conjunctivae normal.  Cardiovascular:     Rate and Rhythm: Normal rate and regular rhythm.     Heart sounds:  No murmur heard.   Pulmonary:     Effort: Pulmonary effort is normal.     Breath sounds: No stridor. No wheezing, rhonchi or rales.  Chest:     Comments: EKG- NSR, 67 bpm Q waves in inf/lat leads appear unchanged No LVH Abdominal:     General: Abdomen is flat.     Palpations: Abdomen is soft. There is no mass.     Tenderness: There is no abdominal tenderness.     Hernia: There is no hernia in the left inguinal area or right inguinal area.  Genitourinary:    Pubic Area: No rash.      Penis: Normal and circumcised.      Testes: Normal.     Epididymis:     Right: Normal. Not inflamed or enlarged. No mass.     Left: Normal. Not inflamed or enlarged. No mass.     Prostate: Enlarged. Not tender and no nodules present.     Rectum: Normal. Guaiac result negative. No mass, tenderness, anal fissure, external hemorrhoid or internal hemorrhoid. Normal anal tone.  Musculoskeletal:        General: Normal range of motion.     Cervical back: Neck supple.     Right lower leg: No edema.     Left lower leg: No edema.  Lymphadenopathy:     Cervical: No cervical adenopathy.     Lower Body: No right inguinal adenopathy. No left inguinal adenopathy.  Skin:    General: Skin is warm and dry.     Coloration: Skin is not pale.  Neurological:     General: No focal deficit present.     Mental Status: He is alert.  Psychiatric:        Mood and Affect: Mood normal.        Behavior: Behavior normal.     Lab Results  Component Value Date   WBC 8.4 07/07/2020   HGB 15.4 07/07/2020   HCT 45.3 07/07/2020   PLT 273.0 07/07/2020   GLUCOSE 149 (H) 07/07/2020   CHOL 158 07/07/2020   TRIG 248.0 (H) 07/07/2020   HDL 39.30 07/07/2020   LDLDIRECT 92.0 07/07/2020   LDLCALC 67 01/27/2019   ALT 19 07/07/2020   AST 16 07/07/2020   NA 139 07/07/2020   K 4.1 07/07/2020   CL 100 07/07/2020   CREATININE 0.87 07/07/2020   BUN 16 07/07/2020   CO2 30 07/07/2020   TSH 1.71 07/07/2020   PSA 2.06 07/07/2020    INR 1.04 04/23/2015   HGBA1C 7.9 (H) 07/07/2020   MICROALBUR 1.1 07/07/2020    MYOCARDIAL PERFUSION IMAGING  Result Date: 02/03/2019  The left ventricular ejection fraction is moderately decreased (30-44%).  Nuclear stress EF: 37%.  There was no ST segment deviation noted during stress.  No T wave inversion was noted during stress.  Defect  1: There is a large defect of severe severity present in the basal inferoseptal, basal inferior, basal inferolateral, mid inferoseptal, mid inferior, mid inferolateral, apical septal and apex location.  Findings consistent with prior myocardial infarction with peri-infarct ischemia.  This is an intermediate risk study.  At baseline, ECG shows pathologic Q wave in 2 or more leads  Large size, severe severity inferoseptal, inferior and inferolateral perfusion defect with minimal reversibility (SDS 3) - suggestive of infarct with minimal peri-infarct ischemia. LVEF 37% with inferior, inferoseptal and basal to mid inferolateral hypokinesis. This is an intermediate risk study.    Assessment & Plan:   Yuri was seen today for annual exam, hypertension, hyperlipidemia and diabetes.  Diagnoses and all orders for this visit:  Essential hypertension- His blood pressure is not adequately well controlled.  I have asked that he be more compliant with his antihypertensives.  Will add nebivolol and a peripheral alpha-blocker. -     CBC with Differential/Platelet; Future -     Basic metabolic panel; Future -     Hepatic function panel; Future -     TSH; Future -     Urinalysis, Routine w reflex microscopic; Future -     EKG 12-Lead -     Aldosterone + renin activity w/ ratio; Future -     nebivolol (BYSTOLIC) 5 MG tablet; Take 1 tablet (5 mg total) by mouth daily. -     terazosin (HYTRIN) 2 MG capsule; Take 1 capsule (2 mg total) by mouth at bedtime. 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  Will restart Metformin and the SGLT2 inhibitor. -     Basic metabolic panel; Future -     Microalbumin / creatinine urine ratio; Future -     Hepatic function panel; Future -     Hemoglobin A1c; Future -     HM Diabetes Foot Exam -     Hemoglobin A1c -     Hepatic function panel -     Microalbumin / creatinine urine ratio -     Basic metabolic panel -     dapagliflozin propanediol (FARXIGA) 10 MG TABS tablet; Take 1 tablet (10 mg total) by mouth daily. -     Discontinue: metFORMIN (GLUMETZA) 1000 MG (MOD) 24 hr tablet; Take 1 tablet (1,000 mg total) by mouth 2 (two) times daily with a meal.  Hyperlipidemia with target LDL less than 70- LDL goal achieved. Doing well on the statin -     Hepatic function panel; Future -     TSH; Future -     TSH -     Hepatic function panel  Hypertriglyceridemia- His triglycerides remain too high.  I recommended that he restart icosapent ethyl. -     icosapent Ethyl (VASCEPA) 1 g capsule; Take 2 capsules (2 g total) by mouth 2 (two) times daily.  Routine general medical examination at a health care facility- Exam completed, labs reviewed, vaccines reviewed and updated, cancer screenings are up-to-date, patient education material was given. -     Lipid panel; Future -     PSA; Future -     PSA -  Lipid panel  BPH with obstruction/lower urinary tract symptoms- His PSA is normal which is a reassuring sign that he does not have prostate cancer.  I recommended that he start taking a peripheral alpha-blocker to help with the symptoms. -     terazosin (HYTRIN) 2 MG capsule; Take 1 capsule (2 mg total) by mouth at bedtime.  Vitamin D deficiency disease -     VITAMIN D 25 Hydroxy (Vit-D Deficiency, Fractures); Future -     VITAMIN D 25 Hydroxy (Vit-D Deficiency,  Fractures)  Atherosclerosis of native coronary artery of native heart without angina pectoris- He has had no recent episodes of angina.  Will continue to address risk factor modifications. -     icosapent Ethyl (VASCEPA) 1 g capsule; Take 2 capsules (2 g total) by mouth 2 (two) times daily. -     aspirin 81 MG EC tablet; Take 1 tablet (81 mg total) by mouth daily.  Other orders -     LDL cholesterol, direct   I have discontinued Casimiro NeedleMichael C. Copelan's Janumet XR. I have also changed his Marcelline DeistFarxiga to dapagliflozin propanediol and Vascepa to icosapent Ethyl. Additionally, I am having him start on nebivolol and terazosin. Lastly, I am having him maintain his glucose blood, tadalafil, nitroGLYCERIN, PARoxetine, irbesartan, indapamide, atorvastatin, and aspirin.  Meds ordered this encounter  Medications   nebivolol (BYSTOLIC) 5 MG tablet    Sig: Take 1 tablet (5 mg total) by mouth daily.    Dispense:  90 tablet    Refill:  0   terazosin (HYTRIN) 2 MG capsule    Sig: Take 1 capsule (2 mg total) by mouth at bedtime.    Dispense:  90 capsule    Refill:  1   dapagliflozin propanediol (FARXIGA) 10 MG TABS tablet    Sig: Take 1 tablet (10 mg total) by mouth daily.    Dispense:  90 tablet    Refill:  1   icosapent Ethyl (VASCEPA) 1 g capsule    Sig: Take 2 capsules (2 g total) by mouth 2 (two) times daily.    Dispense:  360 capsule    Refill:  1   aspirin 81 MG EC tablet    Sig: Take 1 tablet (81 mg total) by mouth daily.    Dispense:  90 tablet    Refill:  1   DISCONTD: metFORMIN (GLUMETZA) 1000 MG (MOD) 24 hr tablet    Sig: Take 1 tablet (1,000 mg total) by mouth 2 (two) times daily with a meal.    Dispense:  180 tablet    Refill:  1     Follow-up: Return in about 3 months (around 10/07/2020).  Sanda Lingerhomas Victorious Kundinger, MD

## 2020-07-07 NOTE — Patient Instructions (Signed)

## 2020-07-10 ENCOUNTER — Other Ambulatory Visit: Payer: Self-pay | Admitting: Internal Medicine

## 2020-07-10 DIAGNOSIS — E1151 Type 2 diabetes mellitus with diabetic peripheral angiopathy without gangrene: Secondary | ICD-10-CM

## 2020-07-10 DIAGNOSIS — I70209 Unspecified atherosclerosis of native arteries of extremities, unspecified extremity: Secondary | ICD-10-CM

## 2020-07-10 MED ORDER — METFORMIN HCL ER 750 MG PO TB24: 1500.0000 mg | ORAL_TABLET | Freq: Every day | ORAL | 1 refills | Status: DC

## 2020-07-10 MED ORDER — DAPAGLIFLOZIN PROPANEDIOL 10 MG PO TABS: 10.0000 mg | ORAL_TABLET | Freq: Every day | ORAL | 1 refills | Status: DC

## 2020-07-10 MED ORDER — ICOSAPENT ETHYL 1 G PO CAPS
2.0000 g | ORAL_CAPSULE | Freq: Two times a day (BID) | ORAL | 1 refills | Status: DC
Start: 2020-07-10 — End: 2020-08-16

## 2020-07-10 MED ORDER — ASPIRIN 81 MG PO TBEC: 81.0000 mg | DELAYED_RELEASE_TABLET | Freq: Every day | ORAL | 1 refills | Status: DC

## 2020-07-10 MED ORDER — METFORMIN HCL ER (MOD) 1000 MG PO TB24
1000.0000 mg | ORAL_TABLET | Freq: Two times a day (BID) | ORAL | 1 refills | Status: DC
Start: 2020-07-10 — End: 2020-07-10

## 2020-07-11 ENCOUNTER — Encounter: Payer: Self-pay | Admitting: Internal Medicine

## 2020-07-11 MED ORDER — CHOLECALCIFEROL 50 MCG (2000 UT) PO TABS
1.0000 | ORAL_TABLET | Freq: Every day | ORAL | 3 refills | Status: DC
Start: 2020-07-11 — End: 2021-09-13

## 2020-07-11 NOTE — Addendum Note (Signed)
Addended by: Etta Grandchild on: 07/11/2020 07:24 AM   Modules accepted: Orders

## 2020-07-15 ENCOUNTER — Other Ambulatory Visit: Payer: Self-pay | Admitting: Internal Medicine

## 2020-07-15 DIAGNOSIS — E785 Hyperlipidemia, unspecified: Secondary | ICD-10-CM

## 2020-07-15 DIAGNOSIS — I2510 Atherosclerotic heart disease of native coronary artery without angina pectoris: Secondary | ICD-10-CM

## 2020-07-16 LAB — ALDOSTERONE + RENIN ACTIVITY W/ RATIO
ALDO / PRA Ratio: 0.9 Ratio (ref 0.9–28.9)
Aldosterone: 4 ng/dL
Renin Activity: 4.7 ng/mL/h (ref 0.25–5.82)

## 2020-07-19 ENCOUNTER — Other Ambulatory Visit: Payer: Self-pay | Admitting: Internal Medicine

## 2020-07-19 DIAGNOSIS — E1151 Type 2 diabetes mellitus with diabetic peripheral angiopathy without gangrene: Secondary | ICD-10-CM

## 2020-07-29 ENCOUNTER — Telehealth (INDEPENDENT_AMBULATORY_CARE_PROVIDER_SITE_OTHER): Payer: 59 | Admitting: Family

## 2020-07-29 ENCOUNTER — Telehealth: Payer: Self-pay | Admitting: Internal Medicine

## 2020-07-29 DIAGNOSIS — U071 COVID-19: Secondary | ICD-10-CM

## 2020-07-29 NOTE — Progress Notes (Signed)
Justin Galvan is a 60 y.o. male with the following history as recorded in [employer]:  Patient Active Problem List   Diagnosis Date Noted  . Calcific bursitis 11/26/2019  . Patellar tendonitis of right knee 11/25/2019  . Vitamin D deficiency disease 04/02/2018  . 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 Chronic maxillary sinusitis 10/10/2016  . Erectile dysfunction due to arterial insufficiency 10/10/2016  . Hyperlipidemia with target LDL less than 70 09/06/2015  . Routine general medical examination at a health care facility 04/10/2011  . Hypertriglyceridemia 05/03/2009  . CAD, NATIVE VESSEL, Hx. stent-RCA 1997; 2 Taxusprox & mid RCA 06/2003, cath 05/06/13 100% RCA, mid LAD disease 05/03/2009  . 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 Depression with anxiety 08/19/2008  . Essential hypertension 08/19/2008  . BPH with obstruction/lower urinary tract symptoms 08/19/2008    Current Outpatient Medications  Medication Sig Dispense Refill  . aspirin 81 MG EC tablet Take 1 tablet (81 mg total) by mouth daily. 90 tablet 1  . atorvastatin (LIPITOR) 80 MG tablet TAKE 1 TABLET (80 MG TOTAL) BY MOUTH DAILY. AT 6PM 90 tablet 1  . Cholecalciferol 50 MCG (2000 UT) TABS Take 1 tablet (2,000 Units total) by mouth daily. Take with food or whole milk 90 tablet 3  . dapagliflozin propanediol (FARXIGA) 10 MG TABS tablet Take 1 tablet (10 mg total) by mouth daily. 90 tablet 1  . glucose blood test strip Test up to two times daily 100 each 12  . icosapent Ethyl (VASCEPA) 1 g capsule Take 2 capsules (2 g total) by mouth 2 (two) times daily. 360 capsule 1  . indapamide (LOZOL) 1.25 MG tablet Take 1 tablet (1.25 mg total) by mouth daily. 90 tablet 1  . irbesartan (AVAPRO) 300 MG tablet Take 1 tablet (300 mg total) by mouth daily. 90 tablet 1  . metFORMIN (GLUCOPHAGE-XR) 750 MG 24 hr tablet Take 2 tablets (1,500 mg total) by mouth daily with breakfast. 180 tablet 1  .  nebivolol (BYSTOLIC) 5 MG tablet Take 1 tablet (5 mg total) by mouth daily. 90 tablet 0  . nitroGLYCERIN (NITROSTAT) 0.4 MG SL tablet Place 1 tablet (0.4 mg total) under the tongue every 5 (five) minutes as needed for chest pain. 25 tablet 6  . PARoxetine (PAXIL) 40 MG tablet Take 1 tablet (40 mg total) by mouth daily. 30 tablet 5  . tadalafil (CIALIS) 20 MG tablet Take 1 tablet (20 mg total) by mouth daily as needed for erectile dysfunction. 8 tablet 0  . terazosin (HYTRIN) 2 MG capsule Take 1 capsule (2 mg total) by mouth at bedtime. 90 capsule 1   No current facility-administered medications for this visit.    Allergies: Patient has no known allergies.  Past Medical History:  Diagnosis Date  . Anginal pain (HCC)   . Arthritis    "fingers" (04/22/2015)  . BPH (benign prostatic hypertrophy)   . CAD, NATIVE VESSEL, Hx. stent-RCA 1997; 2 Taxusprox & mid RCA 06/2003, cath 05/06/13 100% RCA, mid LAD disease 05/03/2009   Qualifier: Diagnosis of  By: Denyse Amass CMA, Carol    . Depression   . 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 DM2 (diabetes mellitus, type 2) (HCC)   . Erectile dysfunction due to arterial insufficiency 10/10/2016  . Essential hypertension 08/19/2008  . HLD (hyperlipidemia)   . HTN (hypertension)   . Hypertriglyceridemia 05/03/2009       . Kidney  stones   . MI (myocardial infarction) (HCC) 1997; ~ 2002; 04/2013  . 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 S/P coronary artery stent placement -mid LAD, 05/06/13 Promus Premier DES 05/07/2013    Past Surgical History:  Procedure Laterality Date  . CARDIAC CATHETERIZATION N/A 04/26/2015   Procedure: Left Heart Cath and Coronary Angiography;  Surgeon: Kathleene Hazel, MD;  Location: [facility] INVASIVE CV LAB;  Service: Cardiovascular;  Laterality: N/A;  . CORONARY ANGIOPLASTY WITH STENT PLACEMENT  1997 - 04/2013   "got several stents; may 8 or 9"  . CORONARY ANGIOPLASTY WITH STENT PLACEMENT  05/06/2013    DES/LAD               DR Clifton James  . LEFT HEART CATHETERIZATION WITH CORONARY ANGIOGRAM N/A 05/06/2013   Procedure: LEFT HEART CATHETERIZATION WITH CORONARY ANGIOGRAM;  Surgeon: Kathleene Hazel, MD;  Location: [facility] CATH LAB;  Service: Cardiovascular;  Laterality: N/A;    Family History  Problem Relation Age of Onset  . Heart disease Mother   . Hypertension Other   . Cancer Neg Hx   . Diabetes Neg Hx   . Early death Neg Hx   . Hyperlipidemia Neg Hx   . Kidney disease Neg Hx   . Stroke Neg Hx   . Alcohol abuse Neg Hx     Social History   Tobacco Use  . Smoking status: Former Smoker    Packs/day: 2.75    Years: 22.00    Pack years: 60.50    Types: Cigarettes    Quit date: 09/07/1996    Years since quitting: 23.9  . Smokeless tobacco: Never Used  Substance Use Topics  . Alcohol use: Yes    Comment: 04/22/2015 "might drink a 6 pack of beer/year"    Subjective:   I connected with [name] on 07/29/20 at  3:20 PM EDT by a telephone call and verified that I am speaking with the correct person using two identifiers.   I discussed the limitations of evaluation and management by telemedicine and the availability of in person appointments. The patient expressed understanding and agreed to proceed. Provider in office/ patient is at home; provider and patient are only 2 people on telephone call.   Took home COVID test today and is +; notes he started with symptoms last Saturday, 4/2; is not vaccinated; denies any chest pain or shortness of breath or difficulty breathing;     Objective:  There were no vitals filed for this visit.  Lungs: Respirations unlabored;  Neurologic: Alert and oriented; speech intact;   Assessment:  1. COVID-19     Plan:  Referral for consideration of monoclonal antibodies; symptomatic treatment discussed; encouraged to stay hydrated;   Time spent 10 minutes  No follow-ups on file.  Orders Placed This Encounter  Procedures  . Ambulatory  referral for Covid Treatment    Referral Priority:   Routine    Referral Type:   Auth/Cert    Referral Reason:   Specialty Services Required    Number of Visits Requested:   1    Requested Prescriptions    No prescriptions requested or ordered in this encounter

## 2020-07-29 NOTE — Telephone Encounter (Signed)
   Patient calling to report he is  COVID posiitve (home test)

## 2020-07-30 ENCOUNTER — Telehealth: Payer: Self-pay | Admitting: Physician Assistant

## 2020-07-30 NOTE — Telephone Encounter (Signed)
Called to discuss with patient about COVID-19 symptoms and the use of one of the available treatments for those with mild to moderate Covid symptoms and at a high risk of hospitalization.  Pt appears to qualify for outpatient treatment due to co-morbid conditions and/or a member of an at-risk group in accordance with the FDA Emergency Use Authorization.    Symptom onset: 4/2 per notes Vaccinated: no Booster? no Immunocompromised? no Qualifiers: DM, CVD, BMI   Unable to reach pt - VM is full. It appears he is out of the treatment window anyway ( 5 days for orals and 7 days for infusions)  Cline Crock

## 2020-08-01 ENCOUNTER — Telehealth: Payer: Self-pay | Admitting: Internal Medicine

## 2020-08-01 NOTE — Telephone Encounter (Signed)
Pt's wife stated that his O2 has came back up and he is doing fine now. I did inform her that PCP strongly recommends going to the ER. I stated that if his O2 drops again or if any new unusual symptoms begin for him then they should go to the ER for evaluation. She expressed understanding.

## 2020-08-01 NOTE — Telephone Encounter (Signed)
[facility] FYI  Caller states the patient has low oxygen saturation of 83. He has lethargic, positive for COVID. He is weak and laying on the couch. been in 80s each time they take it. been sick since last thursday and passed out last friday. asking for meds to be called in. patient feels weak.   Advised to go to ED. Patient understood and agreed.   Also advised they may call 911 for transport. patient is awake and alert. he denies shortness of breath. pule ox reads 80s on every check with HR in the 60s. nail beds are pale.

## 2020-08-01 NOTE — Telephone Encounter (Signed)
   Spouse calling to report patient has low o2 , 83%, feeling weak, lethargic. Covid+ Patient has virtual appt 4/8 Spouse would like Dr Yetta Barre to be aware of patients condition   Call transferred to [facility]

## 2020-08-02 ENCOUNTER — Encounter (HOSPITAL_COMMUNITY): Payer: Self-pay | Admitting: Student

## 2020-08-02 ENCOUNTER — Emergency Department (HOSPITAL_COMMUNITY): Payer: 59

## 2020-08-02 ENCOUNTER — Inpatient Hospital Stay (HOSPITAL_COMMUNITY)
Admission: EM | Admit: 2020-08-02 | Discharge: 2020-08-06 | DRG: 177 | Disposition: A | Discharge status: Home / Self Care | Payer: 59 | Attending: Internal Medicine | Admitting: Internal Medicine

## 2020-08-02 ENCOUNTER — Other Ambulatory Visit: Payer: Self-pay

## 2020-08-02 DIAGNOSIS — Z7984 Long term (current) use of oral hypoglycemic drugs: Secondary | ICD-10-CM

## 2020-08-02 DIAGNOSIS — I70209 Unspecified atherosclerosis of native arteries of extremities, unspecified extremity: Secondary | ICD-10-CM | POA: Diagnosis not present

## 2020-08-02 DIAGNOSIS — T380X5A Adverse effect of glucocorticoids and synthetic analogues, initial encounter: Secondary | ICD-10-CM | POA: Diagnosis present

## 2020-08-02 DIAGNOSIS — I509 Heart failure, unspecified: Secondary | ICD-10-CM

## 2020-08-02 DIAGNOSIS — M19049 Primary osteoarthritis, unspecified hand: Secondary | ICD-10-CM | POA: Diagnosis present

## 2020-08-02 DIAGNOSIS — I255 Ischemic cardiomyopathy: Secondary | ICD-10-CM | POA: Diagnosis present

## 2020-08-02 DIAGNOSIS — E1151 Type 2 diabetes mellitus with diabetic peripheral angiopathy without gangrene: Secondary | ICD-10-CM

## 2020-08-02 DIAGNOSIS — Z8249 Family history of ischemic heart disease and other diseases of the circulatory system: Secondary | ICD-10-CM

## 2020-08-02 DIAGNOSIS — J9601 Acute respiratory failure with hypoxia: Secondary | ICD-10-CM | POA: Diagnosis present

## 2020-08-02 DIAGNOSIS — E781 Pure hyperglyceridemia: Secondary | ICD-10-CM | POA: Diagnosis present

## 2020-08-02 DIAGNOSIS — I110 Hypertensive heart disease with heart failure: Secondary | ICD-10-CM | POA: Diagnosis present

## 2020-08-02 DIAGNOSIS — J1282 Pneumonia due to coronavirus disease 2019: Secondary | ICD-10-CM | POA: Diagnosis present

## 2020-08-02 DIAGNOSIS — Z7982 Long term (current) use of aspirin: Secondary | ICD-10-CM | POA: Diagnosis not present

## 2020-08-02 DIAGNOSIS — I2510 Atherosclerotic heart disease of native coronary artery without angina pectoris: Secondary | ICD-10-CM | POA: Diagnosis present

## 2020-08-02 DIAGNOSIS — I5023 Acute on chronic systolic (congestive) heart failure: Secondary | ICD-10-CM | POA: Diagnosis not present

## 2020-08-02 DIAGNOSIS — Z9114 Patient's other noncompliance with medication regimen: Secondary | ICD-10-CM | POA: Diagnosis not present

## 2020-08-02 DIAGNOSIS — E876 Hypokalemia: Secondary | ICD-10-CM | POA: Diagnosis present

## 2020-08-02 DIAGNOSIS — Z955 Presence of coronary angioplasty implant and graft: Secondary | ICD-10-CM

## 2020-08-02 DIAGNOSIS — U071 COVID-19: Principal | ICD-10-CM | POA: Diagnosis present

## 2020-08-02 DIAGNOSIS — E1165 Type 2 diabetes mellitus with hyperglycemia: Secondary | ICD-10-CM | POA: Diagnosis present

## 2020-08-02 DIAGNOSIS — Z87442 Personal history of urinary calculi: Secondary | ICD-10-CM

## 2020-08-02 DIAGNOSIS — Z79899 Other long term (current) drug therapy: Secondary | ICD-10-CM | POA: Diagnosis not present

## 2020-08-02 DIAGNOSIS — E785 Hyperlipidemia, unspecified: Secondary | ICD-10-CM | POA: Diagnosis present

## 2020-08-02 DIAGNOSIS — I252 Old myocardial infarction: Secondary | ICD-10-CM

## 2020-08-02 DIAGNOSIS — I5041 Acute combined systolic (congestive) and diastolic (congestive) heart failure: Secondary | ICD-10-CM | POA: Diagnosis not present

## 2020-08-02 DIAGNOSIS — R0603 Acute respiratory distress: Secondary | ICD-10-CM | POA: Diagnosis not present

## 2020-08-02 DIAGNOSIS — N400 Enlarged prostate without lower urinary tract symptoms: Secondary | ICD-10-CM | POA: Diagnosis present

## 2020-08-02 LAB — COMPREHENSIVE METABOLIC PANEL
ALT: 33 U/L (ref 0–44)
AST: 30 U/L (ref 15–41)
Albumin: 3.3 g/dL — ABNORMAL LOW (ref 3.5–5.0)
Alkaline Phosphatase: 67 U/L (ref 38–126)
Anion gap: 7 (ref 5–15)
BUN: 18 mg/dL (ref 8–23)
CO2: 28 mmol/L (ref 22–32)
Calcium: 8.7 mg/dL — ABNORMAL LOW (ref 8.9–10.3)
Chloride: 102 mmol/L (ref 98–111)
Creatinine, Ser: 0.97 mg/dL (ref 0.61–1.24)
GFR, Estimated: >60 mL/min (ref >60)
Glucose, Bld: 245 mg/dL — ABNORMAL HIGH (ref 70–99)
Potassium: 4 mmol/L (ref 3.5–5.1)
Sodium: 137 mmol/L (ref 135–145)
Total Bilirubin: 0.8 mg/dL (ref 0.3–1.2)
Total Protein: 6.5 g/dL (ref 6.5–8.1)

## 2020-08-02 LAB — D-DIMER, QUANTITATIVE: D-Dimer, Quant: 0.67 ug/mL-FEU — ABNORMAL HIGH (ref 0.00–0.50)

## 2020-08-02 LAB — CBC WITH DIFFERENTIAL/PLATELET
Abs Immature Granulocytes: 0.04 10*3/uL (ref 0.00–0.07)
Basophils Absolute: 0 10*3/uL (ref 0.0–0.1)
Basophils Relative: 0 %
Eosinophils Absolute: 0 10*3/uL (ref 0.0–0.5)
Eosinophils Relative: 0 %
HCT: 41.2 % (ref 39.0–52.0)
Hemoglobin: 13.9 g/dL (ref 13.0–17.0)
Immature Granulocytes: 1 %
Lymphocytes Relative: 17 %
Lymphs Abs: 0.7 10*3/uL (ref 0.7–4.0)
MCH: 29.6 pg (ref 26.0–34.0)
MCHC: 33.7 g/dL (ref 30.0–36.0)
MCV: 87.8 fL (ref 80.0–100.0)
Monocytes Absolute: 0.5 10*3/uL (ref 0.1–1.0)
Monocytes Relative: 11 %
Neutro Abs: 3.1 10*3/uL (ref 1.7–7.7)
Neutrophils Relative %: 71 %
Platelets: 221 10*3/uL (ref 150–400)
RBC: 4.69 MIL/uL (ref 4.22–5.81)
RDW: 12.9 % (ref 11.5–15.5)
WBC: 4.4 10*3/uL (ref 4.0–10.5)
nRBC: 0 % (ref 0.0–0.2)

## 2020-08-02 LAB — GLUCOSE, CAPILLARY
Glucose-Capillary: 274 mg/dL — ABNORMAL HIGH (ref 70–99)
Glucose-Capillary: 534 mg/dL (ref 70–99)
Glucose-Capillary: 507 mg/dL (ref 70–99)
Glucose-Capillary: 427 mg/dL — ABNORMAL HIGH (ref 70–99)

## 2020-08-02 LAB — LACTIC ACID, PLASMA
Lactic Acid, Venous: 2 mmol/L (ref 0.5–1.9)
Lactic Acid, Venous: 2 mmol/L (ref 0.5–1.9)

## 2020-08-02 LAB — FIBRINOGEN: Fibrinogen: 781 mg/dL — ABNORMAL HIGH (ref 210–475)

## 2020-08-02 LAB — RESP PANEL BY RT-PCR (FLU A&B, COVID) ARPGX2
Influenza A by PCR: NEGATIVE
Influenza B by PCR: NEGATIVE
SARS Coronavirus 2 by RT PCR: POSITIVE — AB

## 2020-08-02 LAB — FERRITIN: Ferritin: 424 ng/mL — ABNORMAL HIGH (ref 24–336)

## 2020-08-02 LAB — HIV ANTIBODY (ROUTINE TESTING W REFLEX): HIV Screen 4th Generation wRfx: NONREACTIVE

## 2020-08-02 LAB — LACTATE DEHYDROGENASE: LDH: 266 U/L — ABNORMAL HIGH (ref 98–192)

## 2020-08-02 LAB — C-REACTIVE PROTEIN: CRP: 13.3 mg/dL — ABNORMAL HIGH (ref <1.0)

## 2020-08-02 LAB — TRIGLYCERIDES: Triglycerides: 324 mg/dL — ABNORMAL HIGH (ref <150)

## 2020-08-02 LAB — PROCALCITONIN: Procalcitonin: <0.1 ng/mL

## 2020-08-02 MED ORDER — ONDANSETRON HCL 4 MG/2ML IJ SOLN: 4.0000 mg | Freq: Four times a day (QID) | INTRAMUSCULAR | Status: DC | PRN

## 2020-08-02 MED ORDER — FUROSEMIDE 10 MG/ML IJ SOLN
40.0000 mg | Freq: Once | INTRAMUSCULAR | Status: AC
  Administered 2020-08-02: 40 mg via INTRAVENOUS
  Filled 2020-08-02: qty 4

## 2020-08-02 MED ORDER — SODIUM CHLORIDE 0.9 % IV SOLN
100.0000 mg | Freq: Every day | INTRAVENOUS | Status: AC
  Administered 2020-08-03 – 2020-08-06 (×4): 100 mg via INTRAVENOUS
  Filled 2020-08-02: qty 20
  Filled 2020-08-02: qty 100
  Filled 2020-08-02 (×2): qty 20

## 2020-08-02 MED ORDER — DAPAGLIFLOZIN PROPANEDIOL 10 MG PO TABS
10.0000 mg | ORAL_TABLET | Freq: Every day | ORAL | Status: DC
  Filled 2020-08-02: qty 1

## 2020-08-02 MED ORDER — ALBUTEROL SULFATE HFA 108 (90 BASE) MCG/ACT IN AERS
2.0000 | INHALATION_SPRAY | Freq: Once | RESPIRATORY_TRACT | Status: AC
  Administered 2020-08-02: 2 via RESPIRATORY_TRACT
  Filled 2020-08-02: qty 6.7

## 2020-08-02 MED ORDER — TERAZOSIN HCL 1 MG PO CAPS
2.0000 mg | ORAL_CAPSULE | Freq: Every day | ORAL | Status: DC
  Administered 2020-08-03 – 2020-08-05 (×3): 2 mg via ORAL
  Filled 2020-08-02 (×4): qty 2

## 2020-08-02 MED ORDER — SODIUM CHLORIDE 0.9 % IV SOLN
200.0000 mg | Freq: Once | INTRAVENOUS | Status: AC
  Administered 2020-08-02: 200 mg via INTRAVENOUS
  Filled 2020-08-02: qty 40

## 2020-08-02 MED ORDER — ATORVASTATIN CALCIUM 80 MG PO TABS
80.0000 mg | ORAL_TABLET | Freq: Every day | ORAL | Status: DC
  Administered 2020-08-02 – 2020-08-05 (×4): 80 mg via ORAL
  Filled 2020-08-02 (×4): qty 1

## 2020-08-02 MED ORDER — PREDNISONE 5 MG PO TABS
50.0000 mg | ORAL_TABLET | Freq: Every day | ORAL | Status: DC
  Administered 2020-08-05: 50 mg via ORAL
  Filled 2020-08-02 (×2): qty 2

## 2020-08-02 MED ORDER — BENZONATATE 100 MG PO CAPS
200.0000 mg | ORAL_CAPSULE | Freq: Three times a day (TID) | ORAL | Status: DC
  Administered 2020-08-02 – 2020-08-06 (×12): 200 mg via ORAL
  Filled 2020-08-02 (×12): qty 2

## 2020-08-02 MED ORDER — NEBIVOLOL HCL 5 MG PO TABS
5.0000 mg | ORAL_TABLET | Freq: Every day | ORAL | Status: DC
  Administered 2020-08-03 – 2020-08-06 (×4): 5 mg via ORAL
  Filled 2020-08-02 (×7): qty 1

## 2020-08-02 MED ORDER — IPRATROPIUM-ALBUTEROL 20-100 MCG/ACT IN AERS
1.0000 | INHALATION_SPRAY | Freq: Four times a day (QID) | RESPIRATORY_TRACT | Status: DC
  Administered 2020-08-02 – 2020-08-03 (×5): 1 via RESPIRATORY_TRACT
  Filled 2020-08-02 (×2): qty 4

## 2020-08-02 MED ORDER — INSULIN ASPART 100 UNIT/ML ~~LOC~~ SOLN
10.0000 [IU] | Freq: Once | SUBCUTANEOUS | Status: AC
  Administered 2020-08-02: 10 [IU] via SUBCUTANEOUS

## 2020-08-02 MED ORDER — ASPIRIN EC 81 MG PO TBEC
81.0000 mg | DELAYED_RELEASE_TABLET | Freq: Every day | ORAL | Status: DC
  Administered 2020-08-02 – 2020-08-06 (×5): 81 mg via ORAL
  Filled 2020-08-02 (×5): qty 1

## 2020-08-02 MED ORDER — PAROXETINE HCL 20 MG PO TABS
40.0000 mg | ORAL_TABLET | Freq: Every day | ORAL | Status: DC
  Administered 2020-08-02 – 2020-08-06 (×5): 40 mg via ORAL
  Filled 2020-08-02 (×6): qty 2

## 2020-08-02 MED ORDER — ICOSAPENT ETHYL 1 G PO CAPS
2.0000 g | ORAL_CAPSULE | Freq: Two times a day (BID) | ORAL | Status: DC
  Administered 2020-08-02 – 2020-08-06 (×8): 2 g via ORAL
  Filled 2020-08-02 (×10): qty 2

## 2020-08-02 MED ORDER — INSULIN ASPART 100 UNIT/ML ~~LOC~~ SOLN
0.0000 [IU] | Freq: Three times a day (TID) | SUBCUTANEOUS | Status: DC
  Administered 2020-08-02: 8 [IU] via SUBCUTANEOUS

## 2020-08-02 MED ORDER — INSULIN GLARGINE 100 UNIT/ML ~~LOC~~ SOLN
10.0000 [IU] | Freq: Every day | SUBCUTANEOUS | Status: DC
  Administered 2020-08-02: 10 [IU] via SUBCUTANEOUS
  Filled 2020-08-02 (×2): qty 0.1

## 2020-08-02 MED ORDER — INSULIN ASPART 100 UNIT/ML ~~LOC~~ SOLN
0.0000 [IU] | Freq: Three times a day (TID) | SUBCUTANEOUS | Status: DC
  Administered 2020-08-02: 15 [IU] via SUBCUTANEOUS

## 2020-08-02 MED ORDER — IRBESARTAN 300 MG PO TABS
300.0000 mg | ORAL_TABLET | Freq: Every day | ORAL | Status: DC
  Administered 2020-08-02 – 2020-08-06 (×5): 300 mg via ORAL
  Filled 2020-08-02 (×6): qty 1

## 2020-08-02 MED ORDER — SODIUM CHLORIDE 0.9 % IV SOLN: 250.0000 mL | INTRAVENOUS | Status: DC | PRN

## 2020-08-02 MED ORDER — ACETAMINOPHEN 325 MG PO TABS: 650.0000 mg | ORAL_TABLET | ORAL | Status: DC | PRN

## 2020-08-02 MED ORDER — METHYLPREDNISOLONE SODIUM SUCC 125 MG IJ SOLR
1.0000 mg/kg | Freq: Two times a day (BID) | INTRAMUSCULAR | Status: AC
Start: 2020-08-02 — End: 2020-08-05
  Administered 2020-08-02 – 2020-08-05 (×6): 78.75 mg via INTRAVENOUS
  Filled 2020-08-02 (×6): qty 2

## 2020-08-02 MED ORDER — INDAPAMIDE 1.25 MG PO TABS
1.2500 mg | ORAL_TABLET | Freq: Every day | ORAL | Status: DC
  Administered 2020-08-03: 1.25 mg via ORAL
  Filled 2020-08-02 (×2): qty 1

## 2020-08-02 MED ORDER — SODIUM CHLORIDE 0.9% FLUSH: 3.0000 mL | INTRAVENOUS | Status: DC | PRN

## 2020-08-02 MED ORDER — METFORMIN HCL ER 500 MG PO TB24
1500.0000 mg | ORAL_TABLET | Freq: Every day | ORAL | Status: DC
Start: 2020-08-03 — End: 2020-08-03

## 2020-08-02 MED ORDER — GUAIFENESIN ER 600 MG PO TB12
1200.0000 mg | ORAL_TABLET | Freq: Two times a day (BID) | ORAL | Status: DC
  Administered 2020-08-02 – 2020-08-06 (×9): 1200 mg via ORAL
  Filled 2020-08-02 (×9): qty 2

## 2020-08-02 MED ORDER — DEXAMETHASONE SODIUM PHOSPHATE 10 MG/ML IJ SOLN
6.0000 mg | Freq: Once | INTRAMUSCULAR | Status: AC
  Administered 2020-08-02: 6 mg via INTRAVENOUS
  Filled 2020-08-02: qty 1

## 2020-08-02 MED ORDER — ENOXAPARIN SODIUM 40 MG/0.4ML ~~LOC~~ SOLN
40.0000 mg | SUBCUTANEOUS | Status: DC
  Administered 2020-08-02 – 2020-08-05 (×4): 40 mg via SUBCUTANEOUS
  Filled 2020-08-02 (×4): qty 0.4

## 2020-08-02 MED ORDER — SODIUM CHLORIDE 0.9% FLUSH
3.0000 mL | Freq: Two times a day (BID) | INTRAVENOUS | Status: DC
  Administered 2020-08-02 – 2020-08-06 (×8): 3 mL via INTRAVENOUS

## 2020-08-02 MED ORDER — AEROCHAMBER PLUS FLO-VU MEDIUM MISC
1.0000 | Freq: Once | Status: AC
  Administered 2020-08-02: 1
  Filled 2020-08-02: qty 1

## 2020-08-02 NOTE — ED Provider Notes (Signed)
[facility] Provider Note   CSN: [id number] Arrival date & time: 08/02/20  1222    History Chief Complaint  Patient presents with  . Shortness of Breath    Filbert SchilderMichael C Sitzman is a 60 y.o. male with a history of CAD, hypertension, T2DM, and hyperlipidemia who presents to the emergency department via EMS with shortness of breath over the past 9 to 10 days.  Patient believes his onset of symptoms was 07/23/2020, had been at home COVID-19 test that was  07/26/2020.  He has been having symptoms of subjective fevers, chills, fatigue, generalized achiness, dry cough, and shortness of breath.  Shortness of breath is worse with activity.  No other alleviating or aggravating factors.  He has been checking his oxygen and it has been in the 80s with at home pulse oximeter, he was told by PCP to come to the emergency department for further assessment.  Per EMS SPO2 89% on room air, applied nasal cannula with improvement.  He has had poor appetite.  Patient denies chest pain, vomiting, diarrhea, abdominal pain, syncope, leg pain/swelling, hemoptysis, recent surgery/trauma, recent long travel, hormone use, personal hx of cancer, or hx of DVT/PE.    HPI     Past Medical History:  Diagnosis Date  . Anginal pain (HCC)   . Arthritis    "fingers" (04/22/2015)  . BPH (benign prostatic hypertrophy)   . CAD, NATIVE VESSEL, Hx. stent-RCA 1997; 2 Taxusprox & mid RCA 06/2003, cath 05/06/13 100% RCA, mid LAD disease 05/03/2009   Qualifier: Diagnosis of  By: Denyse AmassFiato, CMA, Carol    . Depression   . 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 DM2 (diabetes mellitus, type 2) (HCC)   . Erectile dysfunction due to arterial insufficiency 10/10/2016  . Essential hypertension 08/19/2008  . HLD (hyperlipidemia)   . HTN (hypertension)   . Hypertriglyceridemia 05/03/2009       . Kidney stones   . MI (myocardial infarction) (HCC) 1997; ~  2002; 04/2013  . 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 S/P coronary artery stent placement -mid LAD, 05/06/13 Promus Premier DES 05/07/2013    Patient Active Problem List   Diagnosis Date Noted  . Calcific bursitis 11/26/2019  . Patellar tendonitis of right knee 11/25/2019  . Vitamin D deficiency disease 04/02/2018  . 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 Chronic maxillary sinusitis 10/10/2016  . Erectile dysfunction due to arterial insufficiency 10/10/2016  . Hyperlipidemia with target LDL less than 70 09/06/2015  . Routine general medical examination at a health care facility 04/10/2011  . Hypertriglyceridemia 05/03/2009  . CAD, NATIVE VESSEL, Hx. stent-RCA 1997; 2 Taxusprox & mid RCA 06/2003, cath 05/06/13 100% RCA, mid LAD disease 05/03/2009  . 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 Depression with anxiety 08/19/2008  . Essential hypertension 08/19/2008  . BPH with obstruction/lower urinary tract symptoms 08/19/2008    Past Surgical History:  Procedure Laterality Date  . CARDIAC CATHETERIZATION N/A 04/26/2015   Procedure: Left Heart Cath and Coronary Angiography;  Surgeon: Kathleene Hazelhristopher D McAlhany, MD;  [facility];  Service: Cardiovascular;  Laterality: N/A;  . CORONARY ANGIOPLASTY WITH STENT PLACEMENT  1997 - 04/2013   "got several stents; may 8 or 9"  . CORONARY ANGIOPLASTY WITH STENT PLACEMENT  05/06/2013   DES/LAD               DR Clifton JamesMCALHANY  . LEFT HEART CATHETERIZATION  WITH CORONARY ANGIOGRAM N/A 05/06/2013   Procedure: LEFT HEART CATHETERIZATION WITH CORONARY ANGIOGRAM;  Surgeon: Kathleene Hazel, MD;  Location: [facility] CATH LAB;  Service: Cardiovascular;  Laterality: N/A;       Family History  Problem Relation Age of Onset  . Heart disease Mother   . Hypertension Other   . Cancer Neg Hx   . Diabetes Neg Hx   . Early death Neg Hx   . Hyperlipidemia Neg Hx   . Kidney disease Neg Hx   . Stroke Neg Hx   . Alcohol abuse Neg Hx      Social History   Tobacco Use  . Smoking status: Former Smoker    Packs/day: 2.75    Years: 22.00    Pack years: 60.50    Types: Cigarettes    Quit date: 09/07/1996    Years since quitting: 23.9  . Smokeless tobacco: Never Used  Vaping Use  . Vaping Use: Never used  Substance Use Topics  . Alcohol use: Yes    Comment: 04/22/2015 "might drink a 6 pack of beer/year"  . Drug use: No    Home Medications Prior to Admission medications   Medication Sig Start Date End Date Taking? Authorizing Provider  aspirin 81 MG EC tablet Take 1 tablet (81 mg total) by mouth daily. 07/10/20   Etta Grandchild, MD  atorvastatin (LIPITOR) 80 MG tablet TAKE 1 TABLET (80 MG TOTAL) BY MOUTH DAILY. AT 6PM 07/15/20   Etta Grandchild, MD  Cholecalciferol 50 MCG (2000 UT) TABS Take 1 tablet (2,000 Units total) by mouth daily. Take with food or whole milk 07/11/20   Etta Grandchild, MD  dapagliflozin propanediol (FARXIGA) 10 MG TABS tablet Take 1 tablet (10 mg total) by mouth daily. 07/10/20   Etta Grandchild, MD  glucose blood test strip Test up to two times daily 09/10/14   Etta Grandchild, MD  icosapent Ethyl (VASCEPA) 1 g capsule Take 2 capsules (2 g total) by mouth 2 (two) times daily. 07/10/20   Etta Grandchild, MD  indapamide (LOZOL) 1.25 MG tablet Take 1 tablet (1.25 mg total) by mouth daily. 05/04/20   Etta Grandchild, MD  irbesartan (AVAPRO) 300 MG tablet Take 1 tablet (300 mg total) by mouth daily. 05/04/20   Etta Grandchild, MD  metFORMIN (GLUCOPHAGE-XR) 750 MG 24 hr tablet Take 2 tablets (1,500 mg total) by mouth daily with breakfast. 07/10/20   Etta Grandchild, MD  nebivolol (BYSTOLIC) 5 MG tablet Take 1 tablet (5 mg total) by mouth daily. 07/07/20   Etta Grandchild, MD  nitroGLYCERIN (NITROSTAT) 0.4 MG SL tablet Place 1 tablet (0.4 mg total) under the tongue every 5 (five) minutes as needed for chest pain. 02/20/19 05/21/19  Sande Rives, MD  PARoxetine (PAXIL) 40 MG tablet Take 1 tablet (40 mg  total) by mouth daily. 01/24/20   Etta Grandchild, MD  tadalafil (CIALIS) 20 MG tablet Take 1 tablet (20 mg total) by mouth daily as needed for erectile dysfunction. 04/17/18   Olive Bass, FNP  terazosin (HYTRIN) 2 MG capsule Take 1 capsule (2 mg total) by mouth at bedtime. 07/07/20   Etta Grandchild, MD    Allergies    Patient has no known allergies.  Review of Systems   Review of Systems  Constitutional: Positive for appetite change, chills, fatigue and fever.  Respiratory: Positive for cough and shortness of breath.   Cardiovascular: Negative for chest pain and  leg swelling.  Gastrointestinal: Negative for abdominal pain, diarrhea, nausea and vomiting.  Neurological: Negative for syncope.  All other systems reviewed and are negative.   Physical Exam Updated Vital Signs BP (!) 146/70   Pulse 69   Temp 98.3 F (36.8 C) (Oral)   Resp (!) 23   Ht 5\' 7"  (1.702 m)   Wt 78.9 kg   SpO2 93%   BMI 27.25 kg/m   Physical Exam Vitals and nursing note reviewed.  Constitutional:      General: He is not in acute distress.    Appearance: He is well-developed. He is not toxic-appearing.  HENT:     Head: Normocephalic and atraumatic.  Eyes:     General:        Right eye: No discharge.        Left eye: No discharge.     Conjunctiva/sclera: Conjunctivae normal.  Cardiovascular:     Rate and Rhythm: Normal rate and regular rhythm.  Pulmonary:     Effort: Tachypnea present. No respiratory distress.     Breath sounds: No wheezing, rhonchi or rales.     Comments: SPO2 90 to 92% on room air at rest, upon ambulation desaturated to 86% on room air with increased tachypnea. Abdominal:     General: There is no distension.     Palpations: Abdomen is soft.     Tenderness: There is no abdominal tenderness.  Musculoskeletal:     Cervical back: Neck supple.     Right lower leg: No tenderness. No edema.     Left lower leg: No tenderness. No edema.  Skin:    General: Skin is warm  and dry.     Findings: No rash.  Neurological:     Mental Status: He is alert.     Comments: Clear speech.   Psychiatric:        Behavior: Behavior normal.     ED Results / Procedures / Treatments   Labs (all labs ordered are listed, but only abnormal results are displayed) Labs Reviewed  COMPREHENSIVE METABOLIC PANEL - Abnormal; Notable for the following components:      Result Value   Glucose, Bld 245 (*)    Calcium 8.7 (*)    Albumin 3.3 (*)    All other components within normal limits  LACTIC ACID, PLASMA - Abnormal; Notable for the following components:   Lactic Acid, Venous 2.0 (*)    All other components within normal limits  D-DIMER, QUANTITATIVE - Abnormal; Notable for the following components:   D-Dimer, Quant 0.67 (*)    All other components within normal limits  LACTATE DEHYDROGENASE - Abnormal; Notable for the following components:   LDH 266 (*)    All other components within normal limits  TRIGLYCERIDES - Abnormal; Notable for the following components:   Triglycerides 324 (*)    All other components within normal limits  FIBRINOGEN - Abnormal; Notable for the following components:   Fibrinogen 781 (*)    All other components within normal limits  RESP PANEL BY RT-PCR (FLU A&B, COVID) ARPGX2  CULTURE, BLOOD (ROUTINE X 2)  CULTURE, BLOOD (ROUTINE X 2)  CBC WITH DIFFERENTIAL/PLATELET  LACTIC ACID, PLASMA  PROCALCITONIN  FERRITIN  C-REACTIVE PROTEIN    EKG EKG Interpretation  Date/Time:  Tuesday August 02 2020 12:27:22 EDT Ventricular Rate:  68 PR Interval:  150 QRS Duration: 115 QT Interval:  429 QTC Calculation: 457 R Axis:   55 Text Interpretation: Sinus rhythm Nonspecific intraventricular conduction delay  Inferior infarct, old No significant change since last tracing Confirmed by Alvira Monday ([id number]) on 08/02/2020 12:49:47 PM   Radiology DG Chest Port 1 View  Result Date: 08/02/2020 CLINICAL DATA:  Dyspnea. EXAM: PORTABLE CHEST 1 VIEW  COMPARISON:  April 22, 2015. FINDINGS: The heart size and mediastinal contours are within normal limits. No pneumothorax or pleural effusion is noted. Multiple patchy airspace opacities are noted throughout both lungs concerning for multifocal pneumonia. The visualized skeletal structures are unremarkable. IMPRESSION: Multiple patchy airspace opacities are noted bilaterally concerning for multifocal pneumonia. Electronically Signed   By: Lupita Raider M.D.   On: 08/02/2020 14:41    Procedures .Critical Care Performed by: Cherly Anderson, PA-C Authorized by: Cherly Anderson, PA-C    CRITICAL CARE Performed by: Harvie Heck   Total critical care time: 40 minutes  Critical care time was exclusive of separately billable procedures and treating other patients.  Critical care was necessary to treat or prevent imminent or life-threatening deterioration.  Critical care was time spent personally by me on the following activities: development of treatment plan with patient and/or surrogate as well as nursing, discussions with consultants, evaluation of patient's response to treatment, examination of patient, obtaining history from patient or surrogate, ordering and performing treatments and interventions, ordering and review of laboratory studies, ordering and review of radiographic studies, pulse oximetry and re-evaluation of patient's condition.    Medications Ordered in ED Medications  albuterol (VENTOLIN HFA) 108 (90 Base) MCG/ACT inhaler 2 puff (has no administration in time range)  AeroChamber Plus Flo-Vu Medium MISC 1 each (has no administration in time range)  dexamethasone (DECADRON) injection 6 mg (has no administration in time range)  remdesivir 200 mg in sodium chloride 0.9% 250 mL IVPB (has no administration in time range)    Followed by  remdesivir 100 mg in sodium chloride 0.9 % 100 mL IVPB (has no administration in time range)    ED Course  I have  reviewed the triage vital signs and the nursing notes.  Pertinent labs & imaging results that were available during my care of the patient were reviewed by me and considered in my medical decision making (see chart for details).    EMAAD NANNA was evaluated in Emergency Department on 08/02/2020 for the symptoms described in the history of present illness. He/she was evaluated in the context of the global COVID-19 pandemic, which necessitated consideration that the patient might be at risk for infection with the SARS-CoV-2 virus that causes COVID-19. Institutional protocols and algorithms that pertain to the evaluation of patients at risk for COVID-19 are in a state of rapid change based on information released by regulatory bodies including the CDC and federal and state organizations. These policies and algorithms were followed during the patient's care in the ED.  MDM Rules/Calculators/A&P                          Patient presents to the ED with complaints of dyspnea on day 10 of sxs with home positive covid 19 test. Unvaccinated. Mildly tachypneic, hypoxic with ambulation.   Additional history obtained:  Additional history obtained from chart review & nursing note review.   EKG: Sinus rhythm Nonspecific intraventricular conduction delay Inferior infarct, old No significant change since last tracing   Lab Tests:  I Ordered, reviewed, and interpreted labs, which included:  CBC: Unremarkable CMP: Hyperglycemia without acidosis or anion gap elevation Inflammatory markers are elevated including LDH, fibrinogen,  D-dimer, lactic acid, & triglyceride level.  Imaging Studies ordered:  I ordered imaging studies which included CXR, I independently reviewed, formal radiology impression shows: Multiple patchy airspace opacities are noted bilaterally concerning for multifocal pneumonia.  ED Course:  RN informed me patient desaturated to 84% on RA, 2L via [place] applied.  Plan for admission for acute  hypoxic respiratory failure in the setting of COVID 19.  Patient in agreement.   14:55: CONSULT: Discussed with hospitalist Dr. Chipper Herb who accepts admission.   Findings & plan of care discussed with supervising physician Dr. Dalene Seltzer- in agreement.   Portions of this note were generated with Scientist, clinical (histocompatibility and immunogenetics). Dictation errors may occur despite best attempts at proofreading.  Final Clinical Impression(s) / ED Diagnoses Final diagnoses:  Acute hypoxemic respiratory failure [facility])  COVID-19    Rx / DC Orders ED Discharge Orders    None       Cherly Anderson, PA-C 08/02/20 1530    Alvira Monday, MD 08/06/20 0710

## 2020-08-02 NOTE — ED Notes (Signed)
Attempted to give reportx1 

## 2020-08-02 NOTE — H&P (Signed)
History and Physical    Justin Galvan IWL:[id number] DOB: 26-Aug-1960 DOA: 08/02/2020  PCP: Etta Grandchild, MD (Confirm with patient/family/NH records and if not entered, this has to be entered at [facility] point of entry) Patient coming from: Home  I have personally briefly reviewed patient's old medical records in [facility]  Chief Complaint: SOB, cough  HPI: [name] is a 60 y.o. male with medical history significant of CAD, ischemic cardiomyopathy with chronic systolic CHF LVEF 37% (2020), HTN, IDDM, HLD, presented with increasing shortness of breath cough.  Patient was not vaccinated for COVID-19.  Patient started to have symptoms of dry cough and shortness of breath about 10 days ago, along with loss of taste and watery diarrhea.  Then he had at home COVID-19 test which turned positive.  After that, he started to check his pulse ox at home 2 times a day and found most times O2 saturation in the mid 80s.  He contacted his PCP who recommend him to come to the hospital, but he did not "I had to go to work".  For the last 2 days, even minimum activity can trigger significant shortness of breath and dry cough, and he had to take rest multiple times at work.  He also admitted he had not been able to take any of his blood pressure or CHF medications last 2 days because he was feeling so sick. All his family members all tested positive for COVID 19 last week. Today he felt very weak and shortness of breath even at rest then came to ED.  He denied any chest pain, no leg swelling.  Still having symptoms of loss of taste but diarrhea improved. ED Course: Chest x-ray compatible with multifocal pneumonia, compatible with COVID-19 pneumonia. O2 saturation 84% with minimal activity. Stablized on 2 L. BP elevated.  Review of Systems: As per HPI otherwise 14 point review of systems negative.    Past Medical History:  Diagnosis Date  . Anginal pain (HCC)   . Arthritis    "fingers" (04/22/2015)   . BPH (benign prostatic hypertrophy)   . CAD, NATIVE VESSEL, Hx. stent-RCA 1997; 2 Taxusprox & mid RCA 06/2003, cath 05/06/13 100% RCA, mid LAD disease 05/03/2009   Qualifier: Diagnosis of  By: Denyse Amass CMA, Carol    . Depression   . 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 DM2 (diabetes mellitus, type 2) (HCC)   . Erectile dysfunction due to arterial insufficiency 10/10/2016  . Essential hypertension 08/19/2008  . HLD (hyperlipidemia)   . HTN (hypertension)   . Hypertriglyceridemia 05/03/2009       . Kidney stones   . MI (myocardial infarction) (HCC) 1997; ~ 2002; 04/2013  . 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 S/P coronary artery stent placement -mid LAD, 05/06/13 Promus Premier DES 05/07/2013    Past Surgical History:  Procedure Laterality Date  . CARDIAC CATHETERIZATION N/A 04/26/2015   Procedure: Left Heart Cath and Coronary Angiography;  Surgeon: Kathleene Hazel, MD;  Location: [facility] INVASIVE CV LAB;  Service: Cardiovascular;  Laterality: N/A;  . CORONARY ANGIOPLASTY WITH STENT PLACEMENT  1997 - 04/2013   "got several stents; may 8 or 9"  . CORONARY ANGIOPLASTY WITH STENT PLACEMENT  05/06/2013   DES/LAD               DR Clifton James  . LEFT HEART CATHETERIZATION WITH CORONARY ANGIOGRAM N/A 05/06/2013   Procedure: LEFT HEART CATHETERIZATION WITH CORONARY ANGIOGRAM;  Surgeon: Kathleene Hazel, MD;  Location: [facility] CATH LAB;  Service: Cardiovascular;  Laterality: N/A;     reports that he quit smoking about 23 years ago. His smoking use included cigarettes. He has a 60.50 pack-year smoking history. He has never used smokeless tobacco. He reports current alcohol use. He reports that he does not use drugs.  No Known Allergies  Family History  Problem Relation Age of Onset  . Heart disease Mother   . Hypertension Other   . Cancer Neg Hx   . Diabetes Neg Hx   . Early death Neg Hx   . Hyperlipidemia Neg Hx   . Kidney disease Neg Hx   . Stroke Neg  Hx   . Alcohol abuse Neg Hx      Prior to Admission medications   Medication Sig Start Date End Date Taking? Authorizing Provider  aspirin 81 MG EC tablet Take 1 tablet (81 mg total) by mouth daily. 07/10/20  Yes Etta Grandchild, MD  atorvastatin (LIPITOR) 80 MG tablet TAKE 1 TABLET (80 MG TOTAL) BY MOUTH DAILY. AT 6PM 07/15/20  Yes Etta Grandchild, MD  Cholecalciferol 50 MCG (2000 UT) TABS Take 1 tablet (2,000 Units total) by mouth daily. Take with food or whole milk 07/11/20  Yes Etta Grandchild, MD  dapagliflozin propanediol (FARXIGA) 10 MG TABS tablet Take 1 tablet (10 mg total) by mouth daily. 07/10/20  Yes Etta Grandchild, MD  glucose blood test strip Test up to two times daily 09/10/14  Yes Etta Grandchild, MD  icosapent Ethyl (VASCEPA) 1 g capsule Take 2 capsules (2 g total) by mouth 2 (two) times daily. 07/10/20  Yes Etta Grandchild, MD  indapamide (LOZOL) 1.25 MG tablet Take 1 tablet (1.25 mg total) by mouth daily. 05/04/20  Yes Etta Grandchild, MD  irbesartan (AVAPRO) 300 MG tablet Take 1 tablet (300 mg total) by mouth daily. 05/04/20  Yes Etta Grandchild, MD  metFORMIN (GLUCOPHAGE-XR) 750 MG 24 hr tablet Take 2 tablets (1,500 mg total) by mouth daily with breakfast. 07/10/20  Yes Etta Grandchild, MD  nebivolol (BYSTOLIC) 5 MG tablet Take 1 tablet (5 mg total) by mouth daily. 07/07/20  Yes Etta Grandchild, MD  nitroGLYCERIN (NITROSTAT) 0.4 MG SL tablet Place 1 tablet (0.4 mg total) under the tongue every 5 (five) minutes as needed for chest pain. 02/20/19 05/21/19 Yes O'Neal, Ronnald Ramp, MD  PARoxetine (PAXIL) 40 MG tablet Take 1 tablet (40 mg total) by mouth daily. 01/24/20  Yes Etta Grandchild, MD  tadalafil (CIALIS) 20 MG tablet Take 1 tablet (20 mg total) by mouth daily as needed for erectile dysfunction. 04/17/18  Yes Olive Bass, FNP  terazosin (HYTRIN) 2 MG capsule Take 1 capsule (2 mg total) by mouth at bedtime. 07/07/20  Yes Etta Grandchild, MD    Physical Exam: Vitals:    08/02/20 1330 08/02/20 1345 08/02/20 1400 08/02/20 1430  BP: (!) 164/80 (!) 158/74 (!) 157/73 (!) 151/79  Pulse: 69 65 65 66  Resp: (!) 32 11 20 (!) 21  Temp:      TempSrc:      SpO2: 99% 99% 99% 100%  Weight:      Height:        Constitutional: NAD, calm, comfortable Vitals:   08/02/20 1330 08/02/20 1345 08/02/20 1400 08/02/20 1430  BP: (!) 164/80 (!) 158/74 (!) 157/73 (!) 151/79  Pulse: 69 65 65 66  Resp: (!) 32 11 20 (!)  21  Temp:      TempSrc:      SpO2: 99% 99% 99% 100%  Weight:      Height:       Eyes: PERRL, lids and conjunctivae normal ENMT: Mucous membranes are moist. Posterior pharynx clear of any exudate or lesions.Normal dentition.  Neck: normal, supple, no masses, no thyromegaly Respiratory: clear to auscultation bilaterally, no wheezing, fine crackles to the mid level of B/L lung fields. Increasing respiratory effort. No accessory muscle use.  Cardiovascular: Regular rate and rhythm, Soft systolic murmur on heart base. No extremity edema. 2+ pedal pulses. No carotid bruits.  Abdomen: no tenderness, no masses palpated. No hepatosplenomegaly. Bowel sounds positive.  Musculoskeletal: no clubbing / cyanosis. No joint deformity upper and lower extremities. Good ROM, no contractures. Normal muscle tone.  Skin: no rashes, lesions, ulcers. No induration Neurologic: CN 2-12 grossly intact. Sensation intact, DTR normal. Strength 5/5 in all 4.  Psychiatric: Normal judgment and insight. Alert and oriented x 3. Normal mood.     Labs on Admission: I have personally reviewed following labs and imaging studies  CBC: Recent Labs  Lab 08/02/20 1257  WBC 4.4  NEUTROABS 3.1  HGB 13.9  HCT 41.2  MCV 87.8  PLT 221   Basic Metabolic Panel: Recent Labs  Lab 08/02/20 1257  NA 137  K 4.0  CL 102  CO2 28  GLUCOSE 245*  BUN 18  CREATININE 0.97  CALCIUM 8.7*   GFR: Estimated Creatinine Clearance: 73.8 mL/min (by C-G formula based on SCr of 0.97 mg/dL). Liver  Function Tests: Recent Labs  Lab 08/02/20 1257  AST 30  ALT 33  ALKPHOS 67  BILITOT 0.8  PROT 6.5  ALBUMIN 3.3*   No results for input(s): LIPASE, AMYLASE in the last 168 hours. No results for input(s): AMMONIA in the last 168 hours. Coagulation Profile: No results for input(s): INR, PROTIME in the last 168 hours. Cardiac Enzymes: No results for input(s): CKTOTAL, CKMB, CKMBINDEX, TROPONINI in the last 168 hours. BNP (last 3 results) No results for input(s): PROBNP in the last 8760 hours. HbA1C: No results for input(s): HGBA1C in the last 72 hours. CBG: No results for input(s): GLUCAP in the last 168 hours. Lipid Profile: Recent Labs    08/02/20 1319  TRIG 324*   Thyroid Function Tests: No results for input(s): TSH, T4TOTAL, FREET4, T3FREE, THYROIDAB in the last 72 hours. Anemia Panel: Recent Labs    08/02/20 1257  FERRITIN 424*   Urine analysis:    Component Value Date/Time   COLORURINE YELLOW 07/07/2020 1036   APPEARANCEUR CLEAR 07/07/2020 1036   LABSPEC 1.020 07/07/2020 1036   PHURINE 5.5 07/07/2020 1036   GLUCOSEU >=1000 (A) 07/07/2020 1036   HGBUR NEGATIVE 07/07/2020 1036   BILIRUBINUR NEGATIVE 07/07/2020 1036   KETONESUR NEGATIVE 07/07/2020 1036   UROBILINOGEN 0.2 07/07/2020 1036   NITRITE NEGATIVE 07/07/2020 1036   LEUKOCYTESUR NEGATIVE 07/07/2020 1036    Radiological Exams on Admission: DG Chest Port 1 View  Result Date: 08/02/2020 CLINICAL DATA:  Dyspnea. EXAM: PORTABLE CHEST 1 VIEW COMPARISON:  April 22, 2015. FINDINGS: The heart size and mediastinal contours are within normal limits. No pneumothorax or pleural effusion is noted. Multiple patchy airspace opacities are noted throughout both lungs concerning for multifocal pneumonia. The visualized skeletal structures are unremarkable. IMPRESSION: Multiple patchy airspace opacities are noted bilaterally concerning for multifocal pneumonia. Electronically Signed   By: Lupita RaiderJames  Green Jr M.D.   On:  08/02/2020 14:41  EKG: Independently reviewed.  Sinus, no acute ST changes  Assessment/Plan Active Problems:   COVID-19 virus infection   COVID-19  (please populate well all problems here in Problem List. (For example, if patient is on BP meds at home and you resume or decide to hold them, it is a problem that needs to be her. Same for CAD, COPD, HLD and so on)  Acute hypoxic respite failure -Main problem appears to be COVID-19 pneumonia, however physical exam and x-ray also has evidence of fluid overload, probably from noncoherent with BP/CHF medications. -Start remdesivir and steroid regimen for COVID-19 pneumonia -1 dose of 40 mg IV Lasix given, resume home BP/CHF medications, reinforce fluid restriction.  COVID PNA -As above  Acute on chronic systolic CHF decompensation -As above.  Patient lost follow-up for last 2 years, he attributed to COVID-19 pandemic.  Fluid overload likely from noncoherent with CHF medications however physical exam showed there is a new systolic murmur? recommend outpatient cardiac follow-up and outpatient echo. -Chest xray tomorrow.  IIDM with hyperglycemia -Likely from noncompliant with diabetic medications, continue p.o. diabetic medications, add Lantus 10 unit daily and sliding scale.  HTN -Continue home BP meds  DVT prophylaxis: Lovenox Code Status: Full code Family Communication: None at bedside Disposition Plan: Expect more than 2 midnight hospital stay, to wait COVID-19 pneumonia and CHF decompensation. PT evaluation Consults called: None Admission status: Tele admit  Emeline General MD [facility] Pager [phone]  08/02/2020, 3:22 PM

## 2020-08-02 NOTE — ED Triage Notes (Addendum)
Pt arrived via [facility] with c.c of shob and generalized weakness. Per EMS pt tested positive for covid 07/26/20 with a at home test. Pt states his symptoms have just not gotten any better. Denies Chest Pain, N/V. Pt does state he had an syncopal episode this past week but was not seen as he said he "contributed it to everything I have going on".    89% RA --> 96% 4L, 142/80,68HR, BS 309, 98.3

## 2020-08-03 ENCOUNTER — Inpatient Hospital Stay (HOSPITAL_COMMUNITY): Payer: 59

## 2020-08-03 DIAGNOSIS — I5041 Acute combined systolic (congestive) and diastolic (congestive) heart failure: Secondary | ICD-10-CM

## 2020-08-03 DIAGNOSIS — R0603 Acute respiratory distress: Secondary | ICD-10-CM | POA: Diagnosis not present

## 2020-08-03 DIAGNOSIS — U071 COVID-19: Principal | ICD-10-CM

## 2020-08-03 DIAGNOSIS — J9601 Acute respiratory failure with hypoxia: Secondary | ICD-10-CM

## 2020-08-03 LAB — COMPREHENSIVE METABOLIC PANEL
ALT: 33 U/L (ref 0–44)
AST: 31 U/L (ref 15–41)
Albumin: 3.1 g/dL — ABNORMAL LOW (ref 3.5–5.0)
Alkaline Phosphatase: 69 U/L (ref 38–126)
Anion gap: 11 (ref 5–15)
BUN: 22 mg/dL (ref 8–23)
CO2: 25 mmol/L (ref 22–32)
Calcium: 8.6 mg/dL — ABNORMAL LOW (ref 8.9–10.3)
Chloride: 99 mmol/L (ref 98–111)
Creatinine, Ser: 0.93 mg/dL (ref 0.61–1.24)
GFR, Estimated: >60 mL/min (ref >60)
Glucose, Bld: 369 mg/dL — ABNORMAL HIGH (ref 70–99)
Potassium: 3.8 mmol/L (ref 3.5–5.1)
Sodium: 135 mmol/L (ref 135–145)
Total Bilirubin: 0.8 mg/dL (ref 0.3–1.2)
Total Protein: 6.4 g/dL — ABNORMAL LOW (ref 6.5–8.1)

## 2020-08-03 LAB — CBC WITH DIFFERENTIAL/PLATELET
Abs Immature Granulocytes: 0.02 10*3/uL (ref 0.00–0.07)
Basophils Absolute: 0 10*3/uL (ref 0.0–0.1)
Basophils Relative: 0 %
Eosinophils Absolute: 0 10*3/uL (ref 0.0–0.5)
Eosinophils Relative: 0 %
HCT: 38.6 % — ABNORMAL LOW (ref 39.0–52.0)
Hemoglobin: 13.5 g/dL (ref 13.0–17.0)
Immature Granulocytes: 1 %
Lymphocytes Relative: 12 %
Lymphs Abs: 0.4 10*3/uL — ABNORMAL LOW (ref 0.7–4.0)
MCH: 30.3 pg (ref 26.0–34.0)
MCHC: 35 g/dL (ref 30.0–36.0)
MCV: 86.5 fL (ref 80.0–100.0)
Monocytes Absolute: 0.1 10*3/uL (ref 0.1–1.0)
Monocytes Relative: 4 %
Neutro Abs: 2.6 10*3/uL (ref 1.7–7.7)
Neutrophils Relative %: 83 %
Platelets: 231 10*3/uL (ref 150–400)
RBC: 4.46 MIL/uL (ref 4.22–5.81)
RDW: 12.8 % (ref 11.5–15.5)
WBC: 3.1 10*3/uL — ABNORMAL LOW (ref 4.0–10.5)
nRBC: 0 % (ref 0.0–0.2)

## 2020-08-03 LAB — GLUCOSE, CAPILLARY
Glucose-Capillary: 249 mg/dL — ABNORMAL HIGH (ref 70–99)
Glucose-Capillary: 331 mg/dL — ABNORMAL HIGH (ref 70–99)
Glucose-Capillary: 340 mg/dL — ABNORMAL HIGH (ref 70–99)
Glucose-Capillary: 368 mg/dL — ABNORMAL HIGH (ref 70–99)
Glucose-Capillary: 392 mg/dL — ABNORMAL HIGH (ref 70–99)
Glucose-Capillary: 408 mg/dL — ABNORMAL HIGH (ref 70–99)

## 2020-08-03 LAB — PHOSPHORUS: Phosphorus: 3.1 mg/dL (ref 2.5–4.6)

## 2020-08-03 LAB — ECHOCARDIOGRAM COMPLETE
Area-P 1/2: 3.12 cm2
Height: 67 in
S' Lateral: 3.8 cm
Weight: 2784 oz

## 2020-08-03 LAB — MAGNESIUM: Magnesium: 1.9 mg/dL (ref 1.7–2.4)

## 2020-08-03 LAB — D-DIMER, QUANTITATIVE: D-Dimer, Quant: 0.71 ug/mL-FEU — ABNORMAL HIGH (ref 0.00–0.50)

## 2020-08-03 LAB — FERRITIN: Ferritin: 420 ng/mL — ABNORMAL HIGH (ref 24–336)

## 2020-08-03 LAB — C-REACTIVE PROTEIN: CRP: 13.5 mg/dL — ABNORMAL HIGH (ref <1.0)

## 2020-08-03 MED ORDER — INSULIN GLARGINE 100 UNIT/ML ~~LOC~~ SOLN
20.0000 [IU] | Freq: Every day | SUBCUTANEOUS | Status: DC
  Administered 2020-08-03: 20 [IU] via SUBCUTANEOUS
  Filled 2020-08-03 (×2): qty 0.2

## 2020-08-03 MED ORDER — INSULIN ASPART 100 UNIT/ML ~~LOC~~ SOLN
0.0000 [IU] | Freq: Every day | SUBCUTANEOUS | Status: DC
  Administered 2020-08-03: 4 [IU] via SUBCUTANEOUS
  Administered 2020-08-04 – 2020-08-05 (×2): 2 [IU] via SUBCUTANEOUS

## 2020-08-03 MED ORDER — INSULIN ASPART 100 UNIT/ML ~~LOC~~ SOLN
0.0000 [IU] | Freq: Three times a day (TID) | SUBCUTANEOUS | Status: DC
  Administered 2020-08-03 (×2): 20 [IU] via SUBCUTANEOUS
  Administered 2020-08-03 – 2020-08-04 (×2): 7 [IU] via SUBCUTANEOUS
  Administered 2020-08-04 (×2): 15 [IU] via SUBCUTANEOUS
  Administered 2020-08-05: 20 [IU] via SUBCUTANEOUS
  Administered 2020-08-05: 11 [IU] via SUBCUTANEOUS
  Administered 2020-08-05: 15 [IU] via SUBCUTANEOUS
  Administered 2020-08-06: 3 [IU] via SUBCUTANEOUS

## 2020-08-03 MED ORDER — INSULIN ASPART 100 UNIT/ML ~~LOC~~ SOLN
6.0000 [IU] | Freq: Three times a day (TID) | SUBCUTANEOUS | Status: DC
  Administered 2020-08-03 (×3): 6 [IU] via SUBCUTANEOUS

## 2020-08-03 NOTE — Evaluation (Signed)
Physical Therapy Evaluation Patient Details Name: Justin Galvan MRN: [id number] DOB: [birth year] Today's Date: 08/03/2020   History of Present Illness  60 y.o. male presented via [facility] with increasing shortness of breath cough. Pt unvaccinated with positive home COVID test approx 4/2 Pt reports SaO2 using home pulse ox in the 80s. c/o cough, SoB, loss of taste and watery diarrhea. PCP recommended pt go to hospital. Pt reports "I had to go to work" working for 2 days with [employer]. All family members COVID+ last week. In ED Chest x-ray compatible with multifocal pneumonia, compatible with COVID-19 pneumonia. O2 saturation 84% with minimal activity. Stablized on 2 L. BP elevated. Admitted for treatment of COVID PNA 08/02/20 PMH: CAD, ischemic cardiomyopathy with chronic systolic CHF LVEF 37% (2020), HTN, IDDM, HLD,  Clinical Impression  PTA pt living with wife and daughter in multistory home with bed and bath on first floor and 2 steps to enter. Pt completely independent working and driving. Pt is currently limited in safe mobility by decreased endurance and increased O2 demand. Pt is mod I for bed mobility and and transfers and min guard for ambulation of 80 feet. PT will not need any PT services or equipment at discharge however PT will continue to follow acutely to progress mobility.       Follow Up Recommendations No PT follow up;Supervision for mobility/OOB    Equipment Recommendations  None recommended by PT       Precautions / Restrictions Precautions Precautions: None Restrictions Weight Bearing Restrictions: No      Mobility  Bed Mobility Overal bed mobility: Modified Independent             General bed mobility comments: use of bed rail    Transfers Overall transfer level: Modified independent Equipment used: None             General transfer comment: increased time  Ambulation/Gait Ambulation/Gait assistance: Min guard Gait Distance (Feet):  80 Feet Assistive device: None Gait Pattern/deviations: WFL(Within Functional Limits);Step-through pattern Gait velocity: slowed Gait velocity interpretation: 1.31 - 2.62 ft/sec, indicative of limited community ambulator General Gait Details: slow, steady gait         Balance Overall balance assessment: Independent                                           Pertinent Vitals/Pain Pain Assessment: 0-10 Pain Score: 3  Pain Location: headache Pain Descriptors / Indicators: Headache Pain Intervention(s): Limited activity within patient's tolerance;Monitored during session;Repositioned    Home Living Family/patient expects to be discharged to:: Private residence Living Arrangements: Spouse/significant other;Children Available Help at Discharge: Family;Available 24 hours/day Type of Home: House Home Access: Stairs to enter Entrance Stairs-Rails: Doctor, general practice of Steps: 2 Home Layout: Able to live on main level with bedroom/bathroom;Multi-level Home Equipment: None      Prior Function Level of Independence: Independent                  Extremity/Trunk Assessment   Upper Extremity Assessment Upper Extremity Assessment: Overall WFL for tasks assessed    Lower Extremity Assessment Lower Extremity Assessment: Overall WFL for tasks assessed       Communication   Communication: No difficulties  Cognition Arousal/Alertness: Awake/alert Behavior During Therapy: WFL for tasks assessed/performed Overall Cognitive Status: Within Functional Limits for tasks assessed  General Comments General comments (skin integrity, edema, etc.): Pt on 3L O2 via [place] at rest 98%O2, on RA 90%O2, with ambulation on RA dropped to 81%O2, with 3L via [place] during ambulation SaO2 98%O2    Exercises Other Exercises Other Exercises: ISx5 max   Assessment/Plan    PT Assessment Patient needs  continued PT services  PT Problem List Cardiopulmonary status limiting activity       PT Treatment Interventions Gait training    PT Goals (Current goals can be found in the Care Plan section)  Acute Rehab PT Goals Patient Stated Goal: go home PT Goal Formulation: With patient Time For Goal Achievement: 08/17/20 Potential to Achieve Goals: Good    Frequency Min 3X/week    AM-PAC PT "6 Clicks" Mobility  Outcome Measure Help needed turning from your back to your side while in a flat bed without using bedrails?: None Help needed moving from lying on your back to sitting on the side of a flat bed without using bedrails?: None Help needed moving to and from a bed to a chair (including a wheelchair)?: None Help needed standing up from a chair using your arms (e.g., wheelchair or bedside chair)?: None Help needed to walk in hospital room?: None Help needed climbing 3-5 steps with a railing? : A Little 6 Click Score: 23    End of Session Equipment Utilized During Treatment: Oxygen Activity Tolerance: Patient tolerated treatment well Patient left: in chair;with call bell/phone within reach Nurse Communication: Mobility status PT Visit Diagnosis: Difficulty in walking, not elsewhere classified (R26.2)    Time: 0623-7628 PT Time Calculation (min) (ACUTE ONLY): 18 min   Charges:   PT Evaluation $PT Eval Moderate Complexity: 1 Mod          Justin Galvan B. Beverely Risen PT, DPT Acute Rehabilitation Services Pager [phone] Office [phone]   Elon Alas [facility]cshner [facility] 08/03/2020, 3:38 PM

## 2020-08-03 NOTE — Progress Notes (Signed)
  Echocardiogram 2D Echocardiogram has been performed.  Augustine Radar 08/03/2020, 10:36 AM

## 2020-08-03 NOTE — Progress Notes (Signed)
PROGRESS NOTE        PATIENT DETAILS Name: BHAVIN MONJARAZ Age: 60 y.o. Sex: male Date of Birth: 1960-10-15 Admit Date: 08/02/2020 Admitting Physician Emeline General, MD ONG:EXBMW, Bernadene Bell, MD  Brief Narrative: Patient is a 60 y.o. male with history of chronic systolic heart failure due to ischemic cardiomyopathy, CAD, DM-2, HLD-who presented with worsening shortness of breath-patient was found to have acute hypoxic respiratory failure due to COVID-19 pneumonia.  See below for further details.  Vaccination status: Unvaccinated  Significant events: 4/12>> admit to [facility] due to COVID-19 pneumonia  Significant studies: 08/02/2020>> chest x-ray: Multifocal pneumonia 08/03/2020>> chest x-ray: Multifocal pneumonia 08/03/2020>> TTE: EF 50-55%, basal to mid inferior wall is hypokinetic, grade 1 diastolic dysfunction 04/23/2015>> EF 45%, mid to basal inferior hypokinesis  Antimicrobial therapy: Remdesivir: 04/12>>  Microbiology data: 4/12>> blood culture: No growth  Procedures : None  Consults: None  DVT Prophylaxis : enoxaparin (LOVENOX) injection 40 mg Start: 08/02/20 1800   Subjective: Feels slightly better-still requiring 2-3 L of oxygen.  Assessment/Plan: Acute hypoxic respiratory failure due to COVID-19 pneumonia: Mild hypoxemia continues-on 2-3 L of oxygen-remains on steroid/Remdesivir.  If worsens-he has consented to the use of Actemra.  He has no history of TB, hepatitis B or diverticulitis.   Recent Labs    08/02/20 1257 08/03/20 0104  DDIMER 0.67* 0.71*  FERRITIN 424* 420*  LDH 266*  --   CRP 13.3* 13.5*    Lab Results  Component Value Date   SARSCOV2NAA POSITIVE (A) 08/02/2020    Acute on chronic systolic heart failure: Volume status stable-resume oral diuretic regimen.  Remains on beta-blocker and Avapro.  Echo on 4/13 with improved EF when compared to prior.  History of CAD s/p PCI: No anginal symptoms-on  aspirin/statin/beta-blocker  DM-2 (A1c 7.9 on 3/17) with uncontrolled hyperglycemia due to steroids: CBGs significantly elevated due to steroids-increase Lantus to 20 units daily, add 6 units of NovoLog with meals-change SSI to resistant scale.  Follow and adjust.  Recent Labs    08/03/20 0051 08/03/20 0754 08/03/20 1250  GLUCAP 368* 249* 392*    HTN: BP stable-on nebivolol and Avapro  HLD: Continue Vascepa and statin   Diet: Diet Order            Diet heart healthy/carb modified Room service appropriate? Yes; Fluid consistency: Thin; Fluid restriction: 1800 mL Fluid  Diet effective now                  Code Status: Full code   Family Communication: Spoke with Angela-[phone]- over phone-on 4/13  Disposition Plan: Status is: Inpatient  Remains inpatient appropriate because:Inpatient level of care appropriate due to severity of illness   Dispo: The patient is from: Home              Anticipated d/c is to: Home              Patient currently is not medically stable to d/c.   Difficult to place patient No   Barriers to Discharge: Hypoxia requiring O2 supplementation  Antimicrobial agents: Anti-infectives (From admission, onward)   Start     Dose/Rate Route Frequency Ordered Stop   08/03/20 1000  remdesivir 100 mg in sodium chloride 0.9 % 100 mL IVPB       "Followed by" Linked Group Details   100 mg 200 mL/hr  over 30 Minutes Intravenous Daily 08/02/20 1256 08/07/20 0959   08/02/20 1300  remdesivir 200 mg in sodium chloride 0.9% 250 mL IVPB       "Followed by" Linked Group Details   200 mg 580 mL/hr over 30 Minutes Intravenous Once 08/02/20 1256 08/02/20 1559       Time spent: 25- minutes-Greater than 50% of this time was spent in counseling, explanation of diagnosis, planning of further management, and coordination of care.  MEDICATIONS: Scheduled Meds: . aspirin EC  81 mg Oral Daily  . atorvastatin  80 mg Oral q1800  . benzonatate  200 mg Oral TID   . enoxaparin (LOVENOX) injection  40 mg Subcutaneous Q24H  . guaiFENesin  1,200 mg Oral BID  . icosapent Ethyl  2 g Oral BID  . indapamide  1.25 mg Oral Daily  . insulin aspart  0-20 Units Subcutaneous TID WC  . insulin aspart  0-5 Units Subcutaneous QHS  . insulin aspart  6 Units Subcutaneous TID WC  . insulin glargine  20 Units Subcutaneous Daily  . Ipratropium-Albuterol  1 puff Inhalation Q6H  . irbesartan  300 mg Oral Daily  . methylPREDNISolone (SOLU-MEDROL) injection  1 mg/kg Intravenous Q12H   Followed by  . [START ON 08/05/2020] predniSONE  50 mg Oral Daily  . nebivolol  5 mg Oral Daily  . PARoxetine  40 mg Oral Daily  . sodium chloride flush  3 mL Intravenous Q12H  . terazosin  2 mg Oral QHS   Continuous Infusions: . sodium chloride    . remdesivir 100 mg in NS 100 mL 100 mg (08/03/20 0913)   PRN Meds:.sodium chloride, acetaminophen, ondansetron (ZOFRAN) IV, sodium chloride flush   PHYSICAL EXAM: Vital signs: Vitals:   08/02/20 2341 08/03/20 0350 08/03/20 0752 08/03/20 1246  BP: 131/60 133/61 (!) 116/54 (!) 123/54  Pulse: (!) 57  (!) 55 66  Resp: 20 18 20 17   Temp: 97.7 F (36.5 C) 97.8 F (36.6 C) 98.1 F (36.7 C) 98 F (36.7 C)  TempSrc: Oral Axillary Oral Oral  SpO2: 92% 93% 95% 96%  Weight:      Height:       Filed Weights   08/02/20 1200  Weight: 78.9 kg   Body mass index is 27.25 kg/m.   Gen Exam:Alert awake-not in any distress HEENT:atraumatic, normocephalic Chest: B/L clear to auscultation anteriorly CVS:S1S2 regular Abdomen:soft non tender, non distended Extremities:trace edema Neurology: Non focal Skin: no rash  I have personally reviewed following labs and imaging studies  LABORATORY DATA: CBC: Recent Labs  Lab 08/02/20 1257 08/03/20 0104  WBC 4.4 3.1*  NEUTROABS 3.1 2.6  HGB 13.9 13.5  HCT 41.2 38.6*  MCV 87.8 86.5  PLT 221 231    Basic Metabolic Panel: Recent Labs  Lab 08/02/20 1257 08/03/20 0104  NA 137 135  K 4.0  3.8  CL 102 99  CO2 28 25  GLUCOSE 245* 369*  BUN 18 22  CREATININE 0.97 0.93  CALCIUM 8.7* 8.6*  MG  --  1.9  PHOS  --  3.1    GFR: Estimated Creatinine Clearance: 77 mL/min (by C-G formula based on SCr of 0.93 mg/dL).  Liver Function Tests: Recent Labs  Lab 08/02/20 1257 08/03/20 0104  AST 30 31  ALT 33 33  ALKPHOS 67 69  BILITOT 0.8 0.8  PROT 6.5 6.4*  ALBUMIN 3.3* 3.1*   No results for input(s): LIPASE, AMYLASE in the last 168 hours. No results for input(s): AMMONIA  in the last 168 hours.  Coagulation Profile: No results for input(s): INR, PROTIME in the last 168 hours.  Cardiac Enzymes: No results for input(s): CKTOTAL, CKMB, CKMBINDEX, TROPONINI in the last 168 hours.  BNP (last 3 results) No results for input(s): PROBNP in the last 8760 hours.  Lipid Profile: Recent Labs    08/02/20 1319  TRIG 324*    Thyroid Function Tests: No results for input(s): TSH, T4TOTAL, FREET4, T3FREE, THYROIDAB in the last 72 hours.  Anemia Panel: Recent Labs    08/02/20 1257 08/03/20 0104  FERRITIN 424* 420*    Urine analysis:    Component Value Date/Time   COLORURINE YELLOW 07/07/2020 1036   APPEARANCEUR CLEAR 07/07/2020 1036   LABSPEC 1.020 07/07/2020 1036   PHURINE 5.5 07/07/2020 1036   GLUCOSEU >=1000 (A) 07/07/2020 1036   HGBUR NEGATIVE 07/07/2020 1036   BILIRUBINUR NEGATIVE 07/07/2020 1036   KETONESUR NEGATIVE 07/07/2020 1036   UROBILINOGEN 0.2 07/07/2020 1036   NITRITE NEGATIVE 07/07/2020 1036   LEUKOCYTESUR NEGATIVE 07/07/2020 1036    Sepsis Labs: Lactic Acid, Venous    Component Value Date/Time   LATICACIDVEN 2.0 (HH) 08/02/2020 1449    MICROBIOLOGY: Recent Results (from the past 240 hour(s))  Resp Panel by RT-PCR (Flu A&B, Covid) Nasopharyngeal Swab     Status: Abnormal   Collection Time: 08/02/20 12:40 PM   Specimen: Nasopharyngeal Swab; Nasopharyngeal(NP) swabs in vial transport medium  Result Value Ref Range Status   SARS Coronavirus  2 by RT PCR POSITIVE (A) NEGATIVE Final    Comment: CRITICAL RESULT CALLED TO, READ BACK BY AND VERIFIED WITH: RN ALBERTA CRTALES AT 1623 ON 08/02/20 BYH KJ (NOTE) SARS-CoV-2 target nucleic acids are DETECTED.  The SARS-CoV-2 RNA is generally detectable in upper respiratory specimens during the acute phase of infection. Positive results are indicative of the presence of the identified virus, but do not rule out bacterial infection or co-infection with other pathogens not detected by the test. Clinical correlation with patient history and other diagnostic information is necessary to determine patient infection status. The expected result is Negative.  Fact Sheet for Patients: BloggerCourse.comhttps://www.fda.gov/media/152166/download  Fact Sheet for Healthcare Providers: SeriousBroker.ithttps://www.fda.gov/media/152162/download  This test is not yet approved or cleared by the [place] FDA and  has been authorized for detection and/or diagnosis of SARS-CoV-2 by FDA under an Emergency Use Authorization (EUA).  This EUA will remain in effect (mean ing this test can be used) for the duration of  the COVID-19 declaration under Section 564(b)(1) of the Act, 21 U.S.C. section 360bbb-3(b)(1), unless the authorization is terminated or revoked sooner.     Influenza A by PCR NEGATIVE NEGATIVE Final   Influenza B by PCR NEGATIVE NEGATIVE Final    Comment: (NOTE) The Xpert Xpress SARS-CoV-2/FLU/RSV plus assay is intended as an aid in the diagnosis of influenza from Nasopharyngeal swab specimens and should not be used as a sole basis for treatment. Nasal washings and aspirates are unacceptable for Xpert Xpress SARS-CoV-2/FLU/RSV testing.  Fact Sheet for Patients: BloggerCourse.comhttps://www.fda.gov/media/152166/download  Fact Sheet for Healthcare Providers: SeriousBroker.ithttps://www.fda.gov/media/152162/download  This test is not yet approved or cleared by the [place] FDA and has been authorized for detection and/or diagnosis of  SARS-CoV-2 by FDA under an Emergency Use Authorization (EUA). This EUA will remain in effect (meaning this test can be used) for the duration of the COVID-19 declaration under Section 564(b)(1) of the Act, 21 U.S.C. section 360bbb-3(b)(1), unless the authorization is terminated or revoked.  Performed at [facility],  [address], [place], [place] [address]   Blood Culture (routine x 2)     Status: None (Preliminary result)   Collection Time: 08/02/20 12:49 PM   Specimen: BLOOD  Result Value Ref Range Status   Specimen Description BLOOD SITE NOT SPECIFIED  Final   Special Requests   Final    BOTTLES DRAWN AEROBIC AND ANAEROBIC Blood Culture adequate volume   Culture   Final    NO GROWTH < 24 HOURS Performed at [facility], [address], [place], [place] [address]    Report Status PENDING  Incomplete  Blood Culture (routine x 2)     Status: None (Preliminary result)   Collection Time: 08/02/20  1:08 PM   Specimen: BLOOD  Result Value Ref Range Status   Specimen Description BLOOD LEFT ANTECUBITAL  Final   Special Requests   Final    BOTTLES DRAWN AEROBIC AND ANAEROBIC Blood Culture results may not be optimal due to an excessive volume of blood received in culture bottles   Culture   Final    NO GROWTH < 24 HOURS Performed at [facility], [address], [place], [place] [address]    Report Status PENDING  Incomplete    RADIOLOGY STUDIES/RESULTS: DG Chest Port 1 View  Result Date: 08/03/2020 CLINICAL DATA:  Shortness of breath EXAM: PORTABLE CHEST 1 VIEW COMPARISON:  August 02, 2020 FINDINGS: Ill-defined airspace opacity persists in the right upper lobe. More subtle opacity is noted in each lung bases. No consolidation. Heart size and pulmonary vascularity are normal. No adenopathy. There is aortic atherosclerosis. No bone lesions. IMPRESSION: Ill-defined areas of airspace opacity, most notable in the right upper lobe. Question atypical organism pneumonia.  No consolidation. Stable cardiac silhouette. Aortic Atherosclerosis (ICD10-I70.0). Electronically Signed   By: Bretta Bang III M.D.   On: 08/03/2020 08:10   DG Chest Port 1 View  Result Date: 08/02/2020 CLINICAL DATA:  Dyspnea. EXAM: PORTABLE CHEST 1 VIEW COMPARISON:  April 22, 2015. FINDINGS: The heart size and mediastinal contours are within normal limits. No pneumothorax or pleural effusion is noted. Multiple patchy airspace opacities are noted throughout both lungs concerning for multifocal pneumonia. The visualized skeletal structures are unremarkable. IMPRESSION: Multiple patchy airspace opacities are noted bilaterally concerning for multifocal pneumonia. Electronically Signed   By: Lupita Raider M.D.   On: 08/02/2020 14:41   ECHOCARDIOGRAM COMPLETE  Result Date: 08/03/2020    ECHOCARDIOGRAM REPORT   Patient Name:   SANKALP FERRELL Date of Exam: 08/03/2020 Medical Rec #:  [id number]        Height:       67.0 in Accession #:    [id number]       Weight:       174.0 lb Date of Birth:  01-Feb-1961         BSA:          1.906 m Patient Age:    60 years         BP:           116/54 mmHg Patient Gender: M                HR:           54 bpm. Exam Location:  Inpatient Procedure: 2D Echo, Cardiac Doppler and Color Doppler Indications:    Acutre respiratory distress R06.03  History:        Patient has prior history of Echocardiogram examinations, most  recent 04/23/2015. Previous Myocardial Infarction and CAD; Risk                 Factors:Hypertension, Diabetes and Dyslipidemia.  Sonographer:    Eulah Pont RDCS Referring Phys: Sharla Kidney Werner Lean [facility] IMPRESSIONS  1. Left ventricular ejection fraction, by estimation, is 50 to 55%. The left ventricle has low normal function. The left ventricle demonstrates regional wall motion abnormalities (see scoring diagram/findings for description). The basal-to-mid inferior wall is hypokinetic. Left ventricular diastolic parameters are consistent  with Grade I diastolic dysfunction (impaired relaxation).  2. Right ventricular systolic function is normal. The right ventricular size is normal. Tricuspid regurgitation signal is inadequate for assessing PA pressure.  3. The mitral valve is normal in structure. Trivial mitral valve regurgitation. No evidence of mitral stenosis.  4. The aortic valve is tricuspid. There is mild calcification of the aortic valve. There is mild thickening of the aortic valve. Aortic valve regurgitation is not visualized. Mild aortic valve sclerosis is present, with no evidence of aortic valve stenosis.  5. The inferior vena cava is normal in size with <50% respiratory variability, suggesting right atrial pressure of 8 mmHg. Comparison(s): Compared to prior TTE in 2016, the LVEF is now slightly improved to 50-55%. There continues to be basal-to-mid inferior wall hypokinesis. FINDINGS  Left Ventricle: Left ventricular ejection fraction, by estimation, is 50 to 55%. The left ventricle has low normal function. The left ventricle demonstrates regional wall motion abnormalities. The basal-to-mid inferior wall is hypokinetic. The left ventricular internal cavity size was normal in size. There is no left ventricular hypertrophy. Left ventricular diastolic parameters are consistent with Grade I diastolic dysfunction (impaired relaxation). Right Ventricle: The right ventricular size is normal. No increase in right ventricular wall thickness. Right ventricular systolic function is normal. Tricuspid regurgitation signal is inadequate for assessing PA pressure. Left Atrium: Left atrial size was normal in size. Right Atrium: Right atrial size was normal in size. Pericardium: There is no evidence of pericardial effusion. Mitral Valve: The mitral valve is normal in structure. There is mild thickening of the mitral valve leaflet(s). Trivial mitral valve regurgitation. No evidence of mitral valve stenosis. Tricuspid Valve: The tricuspid valve is normal  in structure. Tricuspid valve regurgitation is trivial. Aortic Valve: The aortic valve is tricuspid. There is mild calcification of the aortic valve. There is mild thickening of the aortic valve. Aortic valve regurgitation is not visualized. Mild aortic valve sclerosis is present, with no evidence of aortic valve stenosis. Pulmonic Valve: The pulmonic valve was normal in structure. Pulmonic valve regurgitation is not visualized. Aorta: The aortic root and ascending aorta are structurally normal, with no evidence of dilitation. Venous: The inferior vena cava is normal in size with less than 50% respiratory variability, suggesting right atrial pressure of 8 mmHg. IAS/Shunts: No atrial level shunt detected by color flow Doppler.  LEFT VENTRICLE PLAX 2D LVIDd:         5.30 cm  Diastology LVIDs:         3.80 cm  LV e' medial:    6.87 cm/s LV PW:         0.90 cm  LV E/e' medial:  10.1 LV IVS:        0.90 cm  LV e' lateral:   13.10 cm/s LVOT diam:     2.10 cm  LV E/e' lateral: 5.3 LV SV:         94 LV SV Index:   49 LVOT Area:  3.46 cm  RIGHT VENTRICLE RV S prime:     9.90 cm/s TAPSE (M-mode): 1.9 cm LEFT ATRIUM             Index       RIGHT ATRIUM           Index LA diam:        3.90 cm 2.05 cm/m  RA Area:     13.20 cm LA Vol (A2C):   59.2 ml 31.06 ml/m RA Volume:   33.20 ml  17.42 ml/m LA Vol (A4C):   60.4 ml 31.69 ml/m LA Biplane Vol: 60.7 ml 31.85 ml/m  AORTIC VALVE LVOT Vmax:   120.00 cm/s LVOT Vmean:  82.000 cm/s LVOT VTI:    0.270 m  AORTA Ao Root diam: 3.10 cm Ao Asc diam:  3.00 cm MITRAL VALVE MV Area (PHT): 3.12 cm    SHUNTS MV Decel Time: 243 msec    Systemic VTI:  0.27 m MV E velocity: 69.60 cm/s  Systemic Diam: 2.10 cm MV A velocity: 68.60 cm/s MV E/A ratio:  1.01 Laurance Flatten MD Electronically signed by Laurance Flatten MD Signature Date/Time: 08/03/2020/11:33:35 AM    Final      LOS: 1 day   Jeoffrey Massed, MD  [facility]    To contact the attending provider between 7A-7P  or the covering provider during after hours 7P-7A, please log into the web site [email or web address] and access using universal [place] password for that web site. If you do not have the password, please call the hospital operator.  08/03/2020, 3:41 PM

## 2020-08-04 LAB — CBC WITH DIFFERENTIAL/PLATELET
Abs Immature Granulocytes: 0.09 10*3/uL — ABNORMAL HIGH (ref 0.00–0.07)
Basophils Absolute: 0 10*3/uL (ref 0.0–0.1)
Basophils Relative: 0 %
Eosinophils Absolute: 0 10*3/uL (ref 0.0–0.5)
Eosinophils Relative: 0 %
HCT: 37.1 % — ABNORMAL LOW (ref 39.0–52.0)
Hemoglobin: 12.9 g/dL — ABNORMAL LOW (ref 13.0–17.0)
Immature Granulocytes: 1 %
Lymphocytes Relative: 6 %
Lymphs Abs: 0.8 10*3/uL (ref 0.7–4.0)
MCH: 29.9 pg (ref 26.0–34.0)
MCHC: 34.8 g/dL (ref 30.0–36.0)
MCV: 85.9 fL (ref 80.0–100.0)
Monocytes Absolute: 0.6 10*3/uL (ref 0.1–1.0)
Monocytes Relative: 4 %
Neutro Abs: 12.5 10*3/uL — ABNORMAL HIGH (ref 1.7–7.7)
Neutrophils Relative %: 89 %
Platelets: 295 10*3/uL (ref 150–400)
RBC: 4.32 MIL/uL (ref 4.22–5.81)
RDW: 12.7 % (ref 11.5–15.5)
WBC: 14 10*3/uL — ABNORMAL HIGH (ref 4.0–10.5)
nRBC: 0 % (ref 0.0–0.2)

## 2020-08-04 LAB — COMPREHENSIVE METABOLIC PANEL
ALT: 39 U/L (ref 0–44)
AST: 34 U/L (ref 15–41)
Albumin: 2.9 g/dL — ABNORMAL LOW (ref 3.5–5.0)
Alkaline Phosphatase: 62 U/L (ref 38–126)
Anion gap: 9 (ref 5–15)
BUN: 42 mg/dL — ABNORMAL HIGH (ref 8–23)
CO2: 26 mmol/L (ref 22–32)
Calcium: 8.6 mg/dL — ABNORMAL LOW (ref 8.9–10.3)
Chloride: 98 mmol/L (ref 98–111)
Creatinine, Ser: 1.04 mg/dL (ref 0.61–1.24)
GFR, Estimated: >60 mL/min (ref >60)
Glucose, Bld: 311 mg/dL — ABNORMAL HIGH (ref 70–99)
Potassium: 3.4 mmol/L — ABNORMAL LOW (ref 3.5–5.1)
Sodium: 133 mmol/L — ABNORMAL LOW (ref 135–145)
Total Bilirubin: 0.6 mg/dL (ref 0.3–1.2)
Total Protein: 5.9 g/dL — ABNORMAL LOW (ref 6.5–8.1)

## 2020-08-04 LAB — GLUCOSE, CAPILLARY
Glucose-Capillary: 218 mg/dL — ABNORMAL HIGH (ref 70–99)
Glucose-Capillary: 440 mg/dL — ABNORMAL HIGH (ref 70–99)
Glucose-Capillary: 321 mg/dL — ABNORMAL HIGH (ref 70–99)
Glucose-Capillary: 346 mg/dL — ABNORMAL HIGH (ref 70–99)
Glucose-Capillary: 241 mg/dL — ABNORMAL HIGH (ref 70–99)

## 2020-08-04 LAB — C-REACTIVE PROTEIN: CRP: 5.1 mg/dL — ABNORMAL HIGH (ref <1.0)

## 2020-08-04 LAB — MAGNESIUM: Magnesium: 2.2 mg/dL (ref 1.7–2.4)

## 2020-08-04 LAB — D-DIMER, QUANTITATIVE: D-Dimer, Quant: 0.66 ug/mL-FEU — ABNORMAL HIGH (ref 0.00–0.50)

## 2020-08-04 MED ORDER — INSULIN GLARGINE 100 UNIT/ML ~~LOC~~ SOLN
30.0000 [IU] | Freq: Every day | SUBCUTANEOUS | Status: DC
  Administered 2020-08-04 – 2020-08-05 (×2): 30 [IU] via SUBCUTANEOUS
  Filled 2020-08-04 (×3): qty 0.3

## 2020-08-04 MED ORDER — IPRATROPIUM-ALBUTEROL 20-100 MCG/ACT IN AERS
1.0000 | INHALATION_SPRAY | Freq: Four times a day (QID) | RESPIRATORY_TRACT | Status: DC | PRN
  Filled 2020-08-04: qty 4

## 2020-08-04 MED ORDER — POTASSIUM CHLORIDE CRYS ER 20 MEQ PO TBCR: 20.0000 meq | EXTENDED_RELEASE_TABLET | Freq: Once | ORAL | Status: DC

## 2020-08-04 MED ORDER — INSULIN ASPART 100 UNIT/ML ~~LOC~~ SOLN
10.0000 [IU] | Freq: Three times a day (TID) | SUBCUTANEOUS | Status: DC
  Administered 2020-08-04 (×3): 10 [IU] via SUBCUTANEOUS

## 2020-08-04 MED ORDER — FUROSEMIDE 40 MG PO TABS
40.0000 mg | ORAL_TABLET | Freq: Every day | ORAL | Status: DC
  Administered 2020-08-04 – 2020-08-06 (×3): 40 mg via ORAL
  Filled 2020-08-04 (×3): qty 1

## 2020-08-04 MED ORDER — POTASSIUM CHLORIDE CRYS ER 20 MEQ PO TBCR
40.0000 meq | EXTENDED_RELEASE_TABLET | ORAL | Status: AC
  Administered 2020-08-04 (×2): 40 meq via ORAL
  Filled 2020-08-04 (×2): qty 2

## 2020-08-04 NOTE — Progress Notes (Addendum)
PROGRESS NOTE        PATIENT DETAILS Name: Justin Galvan Age: 60 y.o. Sex: male Date of Birth: 03/11/1961 Admit Date: 08/02/2020 Admitting Physician Emeline General, MD XNT:[id number], Bernadene Bell, MD  Brief Narrative: Patient is a 60 y.o. male with history of chronic systolic heart failure due to ischemic cardiomyopathy, CAD, DM-2, HLD-who presented with worsening shortness of breath-patient was found to have acute hypoxic respiratory failure due to COVID-19 pneumonia.  See below for further details.  Vaccination status: Unvaccinated  Significant events: 4/12>> admit to [facility] due to COVID-19 pneumonia  Significant studies: 08/02/2020>> chest x-ray: Multifocal pneumonia 08/03/2020>> chest x-ray: Multifocal pneumonia 08/03/2020>> TTE: EF 50-55%, basal to mid inferior wall is hypokinetic, grade 1 diastolic dysfunction 04/23/2015>> EF 45%, mid to basal inferior hypokinesis  Antimicrobial therapy: Remdesivir: 04/12>>  Microbiology data: 4/12>> blood culture: No growth  Procedures : None  Consults: None  DVT Prophylaxis : enoxaparin (LOVENOX) injection 40 mg Start: 08/02/20 1800   Subjective: Feels better but still on 3-4 L of oxygen overnight.  Assessment/Plan: Acute hypoxic respiratory failure due to COVID-19 pneumonia: Continues to have mild hypoxemia-on 3 L of oxygen this morning-CRP downtrending-continue steroids/Remdesivir.  If he worsens-he has consented to the use of Actemra.  Continue supportive care-continue at attempt to titrate down FiO2.   Recent Labs    08/02/20 1257 08/03/20 0104 08/04/20 0106  DDIMER 0.67* 0.71* 0.66*  FERRITIN 424* 420*  --   LDH 266*  --   --   CRP 13.3* 13.5* 5.1*    Lab Results  Component Value Date   SARSCOV2NAA POSITIVE (A) 08/02/2020    Acute on chronic systolic heart failure: Volume status stable-while inpatient-we will switch from indapamide to Lasix. Marland Kitchen  Echo on 4/13 with improved EF when compared  to prior.  History of CAD s/p PCI: No anginal symptoms-on aspirin/statin/beta-blocker  DM-2 (A1c 7.9 on 3/17) with uncontrolled hyperglycemia due to steroids: CBGs remain significantly elevated-have increased Lantus to 30 units, changed Premeal NovoLog to 10 units-continue resistant SSI.  Follow and adjust.  Recent Labs    08/03/20 2139 08/04/20 0740 08/04/20 1204  GLUCAP 331* 218* 440*    HTN: BP stable-on nebivolol and Avapro  HLD: Continue Vascepa and statin  Hypokalemia: Replete and recheck.   Diet: Diet Order            Diet heart healthy/carb modified Room service appropriate? Yes; Fluid consistency: Thin; Fluid restriction: 1800 mL Fluid  Diet effective now                  Code Status: Full code   Family Communication: Spoke with [phone]-left a voicemail on 4/14.  Disposition Plan: Status is: Inpatient  Remains inpatient appropriate because:Inpatient level of care appropriate due to severity of illness   Dispo: The patient is from: Home              Anticipated d/c is to: Home              Patient currently is not medically stable to d/c.   Difficult to place patient No   Barriers to Discharge: Hypoxia requiring O2 supplementation  Antimicrobial agents: Anti-infectives (From admission, onward)   Start     Dose/Rate Route Frequency Ordered Stop   08/03/20 1000  remdesivir 100 mg in sodium chloride 0.9 % 100 mL IVPB       "  Followed by" Linked Group Details   100 mg 200 mL/hr over 30 Minutes Intravenous Daily 08/02/20 1256 08/07/20 0959   08/02/20 1300  remdesivir 200 mg in sodium chloride 0.9% 250 mL IVPB       "Followed by" Linked Group Details   200 mg 580 mL/hr over 30 Minutes Intravenous Once 08/02/20 1256 08/02/20 1559       Time spent: 25- minutes-Greater than 50% of this time was spent in counseling, explanation of diagnosis, planning of further management, and coordination of care.  MEDICATIONS: Scheduled Meds: . aspirin  EC  81 mg Oral Daily  . atorvastatin  80 mg Oral q1800  . benzonatate  200 mg Oral TID  . enoxaparin (LOVENOX) injection  40 mg Subcutaneous Q24H  . furosemide  40 mg Oral Daily  . guaiFENesin  1,200 mg Oral BID  . icosapent Ethyl  2 g Oral BID  . insulin aspart  0-20 Units Subcutaneous TID WC  . insulin aspart  0-5 Units Subcutaneous QHS  . insulin aspart  10 Units Subcutaneous TID WC  . insulin glargine  30 Units Subcutaneous Daily  . irbesartan  300 mg Oral Daily  . methylPREDNISolone (SOLU-MEDROL) injection  1 mg/kg Intravenous Q12H   Followed by  . [START ON 08/05/2020] predniSONE  50 mg Oral Daily  . nebivolol  5 mg Oral Daily  . PARoxetine  40 mg Oral Daily  . potassium chloride  40 mEq Oral Q4H  . sodium chloride flush  3 mL Intravenous Q12H  . terazosin  2 mg Oral QHS   Continuous Infusions: . sodium chloride    . remdesivir 100 mg in NS 100 mL 100 mg (08/04/20 0908)   PRN Meds:.sodium chloride, acetaminophen, Ipratropium-Albuterol, ondansetron (ZOFRAN) IV, sodium chloride flush   PHYSICAL EXAM: Vital signs: Vitals:   08/04/20 0004 08/04/20 0403 08/04/20 0737 08/04/20 1201  BP: (!) 118/57 107/70 (!) 111/54 (!) 107/47  Pulse: 60 65 (!) 46 (!) 51  Resp: 20 18 20 19   Temp: 97.9 F (36.6 C) 97.6 F (36.4 C) 97.7 F (36.5 C) 97.6 F (36.4 C)  TempSrc: Oral Oral Oral Oral  SpO2: 92% 94% 95% 90%  Weight: 77.8 kg     Height:       Filed Weights   08/02/20 1200 08/04/20 0004  Weight: 78.9 kg 77.8 kg   Body mass index is 26.86 kg/m.   Gen Exam:Alert awake-not in any distress HEENT:atraumatic, normocephalic Chest: B/L clear to auscultation anteriorly CVS:S1S2 regular Abdomen:soft non tender, non distended Extremities:no edema Neurology: Non focal Skin: no rash  I have personally reviewed following labs and imaging studies  LABORATORY DATA: CBC: Recent Labs  Lab 08/02/20 1257 08/03/20 0104 08/04/20 0106  WBC 4.4 3.1* 14.0*  NEUTROABS 3.1 2.6 12.5*   HGB 13.9 13.5 12.9*  HCT 41.2 38.6* 37.1*  MCV 87.8 86.5 85.9  PLT 221 231 295    Basic Metabolic Panel: Recent Labs  Lab 08/02/20 1257 08/03/20 0104 08/04/20 0106  NA 137 135 133*  K 4.0 3.8 3.4*  CL 102 99 98  CO2 28 25 26   GLUCOSE 245* 369* 311*  BUN 18 22 42*  CREATININE 0.97 0.93 1.04  CALCIUM 8.7* 8.6* 8.6*  MG  --  1.9 2.2  PHOS  --  3.1  --     GFR: Estimated Creatinine Clearance: 68.9 mL/min (by C-G formula based on SCr of 1.04 mg/dL).  Liver Function Tests: Recent Labs  Lab 08/02/20 1257 08/03/20 0104 08/04/20  0106  AST 30 31 34  ALT 33 33 39  ALKPHOS 67 69 62  BILITOT 0.8 0.8 0.6  PROT 6.5 6.4* 5.9*  ALBUMIN 3.3* 3.1* 2.9*   No results for input(s): LIPASE, AMYLASE in the last 168 hours. No results for input(s): AMMONIA in the last 168 hours.  Coagulation Profile: No results for input(s): INR, PROTIME in the last 168 hours.  Cardiac Enzymes: No results for input(s): CKTOTAL, CKMB, CKMBINDEX, TROPONINI in the last 168 hours.  BNP (last 3 results) No results for input(s): PROBNP in the last 8760 hours.  Lipid Profile: Recent Labs    08/02/20 1319  TRIG 324*    Thyroid Function Tests: No results for input(s): TSH, T4TOTAL, FREET4, T3FREE, THYROIDAB in the last 72 hours.  Anemia Panel: Recent Labs    08/02/20 1257 08/03/20 0104  FERRITIN 424* 420*    Urine analysis:    Component Value Date/Time   COLORURINE YELLOW 07/07/2020 1036   APPEARANCEUR CLEAR 07/07/2020 1036   LABSPEC 1.020 07/07/2020 1036   PHURINE 5.5 07/07/2020 1036   GLUCOSEU >=1000 (A) 07/07/2020 1036   HGBUR NEGATIVE 07/07/2020 1036   BILIRUBINUR NEGATIVE 07/07/2020 1036   KETONESUR NEGATIVE 07/07/2020 1036   UROBILINOGEN 0.2 07/07/2020 1036   NITRITE NEGATIVE 07/07/2020 1036   LEUKOCYTESUR NEGATIVE 07/07/2020 1036    Sepsis Labs: Lactic Acid, Venous    Component Value Date/Time   LATICACIDVEN 2.0 (HH) 08/02/2020 1449    MICROBIOLOGY: Recent Results  (from the past 240 hour(s))  Resp Panel by RT-PCR (Flu A&B, Covid) Nasopharyngeal Swab     Status: Abnormal   Collection Time: 08/02/20 12:40 PM   Specimen: Nasopharyngeal Swab; Nasopharyngeal(NP) swabs in vial transport medium  Result Value Ref Range Status   SARS Coronavirus 2 by RT PCR POSITIVE (A) NEGATIVE Final    Comment: CRITICAL RESULT CALLED TO, READ BACK BY AND VERIFIED WITH: RN ALBERTA CRTALES AT 1623 ON 08/02/20 BYH KJ (NOTE) SARS-CoV-2 target nucleic acids are DETECTED.  The SARS-CoV-2 RNA is generally detectable in upper respiratory specimens during the acute phase of infection. Positive results are indicative of the presence of the identified virus, but do not rule out bacterial infection or co-infection with other pathogens not detected by the test. Clinical correlation with patient history and other diagnostic information is necessary to determine patient infection status. The expected result is Negative.  Fact Sheet for Patients: BloggerCourse.comhttps://www.fda.gov/media/152166/download  Fact Sheet for Healthcare Providers: SeriousBroker.ithttps://www.fda.gov/media/152162/download  This test is not yet approved or cleared by the [place] FDA and  has been authorized for detection and/or diagnosis of SARS-CoV-2 by FDA under an Emergency Use Authorization (EUA).  This EUA will remain in effect (mean ing this test can be used) for the duration of  the COVID-19 declaration under Section 564(b)(1) of the Act, 21 U.S.C. section 360bbb-3(b)(1), unless the authorization is terminated or revoked sooner.     Influenza A by PCR NEGATIVE NEGATIVE Final   Influenza B by PCR NEGATIVE NEGATIVE Final    Comment: (NOTE) The Xpert Xpress SARS-CoV-2/FLU/RSV plus assay is intended as an aid in the diagnosis of influenza from Nasopharyngeal swab specimens and should not be used as a sole basis for treatment. Nasal washings and aspirates are unacceptable for Xpert Xpress SARS-CoV-2/FLU/RSV testing.  Fact  Sheet for Patients: BloggerCourse.comhttps://www.fda.gov/media/152166/download  Fact Sheet for Healthcare Providers: SeriousBroker.ithttps://www.fda.gov/media/152162/download  This test is not yet approved or cleared by the [place] FDA and has been authorized for detection and/or diagnosis of SARS-CoV-2 by FDA  under an Emergency Use Authorization (EUA). This EUA will remain in effect (meaning this test can be used) for the duration of the COVID-19 declaration under Section 564(b)(1) of the Act, 21 U.S.C. section 360bbb-3(b)(1), unless the authorization is terminated or revoked.  Performed at [facility], [address] [place], [place] [address]   Blood Culture (routine x 2)     Status: None (Preliminary result)   Collection Time: 08/02/20 12:49 PM   Specimen: BLOOD  Result Value Ref Range Status   Specimen Description BLOOD SITE NOT SPECIFIED  Final   Special Requests   Final    BOTTLES DRAWN AEROBIC AND ANAEROBIC Blood Culture adequate volume   Culture   Final    NO GROWTH 2 DAYS Performed at [facility], [address] [place], [place] [address]    Report Status PENDING  Incomplete  Blood Culture (routine x 2)     Status: None (Preliminary result)   Collection Time: 08/02/20  1:08 PM   Specimen: BLOOD  Result Value Ref Range Status   Specimen Description BLOOD LEFT ANTECUBITAL  Final   Special Requests   Final    BOTTLES DRAWN AEROBIC AND ANAEROBIC Blood Culture results may not be optimal due to an excessive volume of blood received in culture bottles   Culture   Final    NO GROWTH 2 DAYS Performed at [facility], [address], [place], [place] [address]    Report Status PENDING  Incomplete    RADIOLOGY STUDIES/RESULTS: DG Chest Port 1 View  Result Date: 08/03/2020 CLINICAL DATA:  Shortness of breath EXAM: PORTABLE CHEST 1 VIEW COMPARISON:  August 02, 2020 FINDINGS: Ill-defined airspace opacity persists in the right upper lobe. More subtle opacity is noted in each lung  bases. No consolidation. Heart size and pulmonary vascularity are normal. No adenopathy. There is aortic atherosclerosis. No bone lesions. IMPRESSION: Ill-defined areas of airspace opacity, most notable in the right upper lobe. Question atypical organism pneumonia. No consolidation. Stable cardiac silhouette. Aortic Atherosclerosis (ICD10-I70.0). Electronically Signed   By: Bretta Bang III M.D.   On: 08/03/2020 08:10   DG Chest Port 1 View  Result Date: 08/02/2020 CLINICAL DATA:  Dyspnea. EXAM: PORTABLE CHEST 1 VIEW COMPARISON:  April 22, 2015. FINDINGS: The heart size and mediastinal contours are within normal limits. No pneumothorax or pleural effusion is noted. Multiple patchy airspace opacities are noted throughout both lungs concerning for multifocal pneumonia. The visualized skeletal structures are unremarkable. IMPRESSION: Multiple patchy airspace opacities are noted bilaterally concerning for multifocal pneumonia. Electronically Signed   By: Lupita Raider M.D.   On: 08/02/2020 14:41   ECHOCARDIOGRAM COMPLETE  Result Date: 08/03/2020    ECHOCARDIOGRAM REPORT   Patient Name:   Justin Galvan Date of Exam: 08/03/2020 Medical Rec #:  [id number]        Height:       67.0 in Accession #:    [id number]       Weight:       174.0 lb Date of Birth:  10/30/1960         BSA:          1.906 m Patient Age:    60 years         BP:           116/54 mmHg Patient Gender: M                HR:  54 bpm. Exam Location:  Inpatient Procedure: 2D Echo, Cardiac Doppler and Color Doppler Indications:    Acutre respiratory distress R06.03  History:        Patient has prior history of Echocardiogram examinations, most                 recent 04/23/2015. Previous Myocardial Infarction and CAD; Risk                 Factors:Hypertension, Diabetes and Dyslipidemia.  Sonographer:    Eulah Pont RDCS Referring Phys: Sharla Kidney Werner Lean [facility] IMPRESSIONS  1. Left ventricular ejection fraction, by estimation, is 50 to  55%. The left ventricle has low normal function. The left ventricle demonstrates regional wall motion abnormalities (see scoring diagram/findings for description). The basal-to-mid inferior wall is hypokinetic. Left ventricular diastolic parameters are consistent with Grade I diastolic dysfunction (impaired relaxation).  2. Right ventricular systolic function is normal. The right ventricular size is normal. Tricuspid regurgitation signal is inadequate for assessing PA pressure.  3. The mitral valve is normal in structure. Trivial mitral valve regurgitation. No evidence of mitral stenosis.  4. The aortic valve is tricuspid. There is mild calcification of the aortic valve. There is mild thickening of the aortic valve. Aortic valve regurgitation is not visualized. Mild aortic valve sclerosis is present, with no evidence of aortic valve stenosis.  5. The inferior vena cava is normal in size with <50% respiratory variability, suggesting right atrial pressure of 8 mmHg. Comparison(s): Compared to prior TTE in 2016, the LVEF is now slightly improved to 50-55%. There continues to be basal-to-mid inferior wall hypokinesis. FINDINGS  Left Ventricle: Left ventricular ejection fraction, by estimation, is 50 to 55%. The left ventricle has low normal function. The left ventricle demonstrates regional wall motion abnormalities. The basal-to-mid inferior wall is hypokinetic. The left ventricular internal cavity size was normal in size. There is no left ventricular hypertrophy. Left ventricular diastolic parameters are consistent with Grade I diastolic dysfunction (impaired relaxation). Right Ventricle: The right ventricular size is normal. No increase in right ventricular wall thickness. Right ventricular systolic function is normal. Tricuspid regurgitation signal is inadequate for assessing PA pressure. Left Atrium: Left atrial size was normal in size. Right Atrium: Right atrial size was normal in size. Pericardium: There is no  evidence of pericardial effusion. Mitral Valve: The mitral valve is normal in structure. There is mild thickening of the mitral valve leaflet(s). Trivial mitral valve regurgitation. No evidence of mitral valve stenosis. Tricuspid Valve: The tricuspid valve is normal in structure. Tricuspid valve regurgitation is trivial. Aortic Valve: The aortic valve is tricuspid. There is mild calcification of the aortic valve. There is mild thickening of the aortic valve. Aortic valve regurgitation is not visualized. Mild aortic valve sclerosis is present, with no evidence of aortic valve stenosis. Pulmonic Valve: The pulmonic valve was normal in structure. Pulmonic valve regurgitation is not visualized. Aorta: The aortic root and ascending aorta are structurally normal, with no evidence of dilitation. Venous: The inferior vena cava is normal in size with less than 50% respiratory variability, suggesting right atrial pressure of 8 mmHg. IAS/Shunts: No atrial level shunt detected by color flow Doppler.  LEFT VENTRICLE PLAX 2D LVIDd:         5.30 cm  Diastology LVIDs:         3.80 cm  LV e' medial:    6.87 cm/s LV PW:         0.90 cm  LV E/e' medial:  10.1  LV IVS:        0.90 cm  LV e' lateral:   13.10 cm/s LVOT diam:     2.10 cm  LV E/e' lateral: 5.3 LV SV:         94 LV SV Index:   49 LVOT Area:     3.46 cm  RIGHT VENTRICLE RV S prime:     9.90 cm/s TAPSE (M-mode): 1.9 cm LEFT ATRIUM             Index       RIGHT ATRIUM           Index LA diam:        3.90 cm 2.05 cm/m  RA Area:     13.20 cm LA Vol (A2C):   59.2 ml 31.06 ml/m RA Volume:   33.20 ml  17.42 ml/m LA Vol (A4C):   60.4 ml 31.69 ml/m LA Biplane Vol: 60.7 ml 31.85 ml/m  AORTIC VALVE LVOT Vmax:   120.00 cm/s LVOT Vmean:  82.000 cm/s LVOT VTI:    0.270 m  AORTA Ao Root diam: 3.10 cm Ao Asc diam:  3.00 cm MITRAL VALVE MV Area (PHT): 3.12 cm    SHUNTS MV Decel Time: 243 msec    Systemic VTI:  0.27 m MV E velocity: 69.60 cm/s  Systemic Diam: 2.10 cm MV A velocity:  68.60 cm/s MV E/A ratio:  1.01 Laurance Flatten MD Electronically signed by Laurance Flatten MD Signature Date/Time: 08/03/2020/11:33:35 AM    Final      LOS: 2 days   Jeoffrey Massed, MD  [facility]    To contact the attending provider between 7A-7P or the covering provider during after hours 7P-7A, please log into the web site [email or web address] and access using universal [place] password for that web site. If you do not have the password, please call the hospital operator.  08/04/2020, 12:10 PM

## 2020-08-04 NOTE — Progress Notes (Addendum)
Notified MD of glucose check greater than 400. Awaiting additional orders from MD  1233 Per Dr. Jerral Ralph, administer total of 25 units of Novolog at this time.

## 2020-08-05 LAB — COMPREHENSIVE METABOLIC PANEL
ALT: 46 U/L — ABNORMAL HIGH (ref 0–44)
AST: 36 U/L (ref 15–41)
Albumin: 2.9 g/dL — ABNORMAL LOW (ref 3.5–5.0)
Alkaline Phosphatase: 61 U/L (ref 38–126)
Anion gap: 6 (ref 5–15)
BUN: 44 mg/dL — ABNORMAL HIGH (ref 8–23)
CO2: 24 mmol/L (ref 22–32)
Calcium: 8.4 mg/dL — ABNORMAL LOW (ref 8.9–10.3)
Chloride: 103 mmol/L (ref 98–111)
Creatinine, Ser: 0.95 mg/dL (ref 0.61–1.24)
GFR, Estimated: >60 mL/min (ref >60)
Glucose, Bld: 304 mg/dL — ABNORMAL HIGH (ref 70–99)
Potassium: 3.5 mmol/L (ref 3.5–5.1)
Sodium: 133 mmol/L — ABNORMAL LOW (ref 135–145)
Total Bilirubin: 0.6 mg/dL (ref 0.3–1.2)
Total Protein: 6 g/dL — ABNORMAL LOW (ref 6.5–8.1)

## 2020-08-05 LAB — C-REACTIVE PROTEIN: CRP: 2.3 mg/dL — ABNORMAL HIGH (ref <1.0)

## 2020-08-05 LAB — CBC WITH DIFFERENTIAL/PLATELET
Abs Immature Granulocytes: 0.26 10*3/uL — ABNORMAL HIGH (ref 0.00–0.07)
Basophils Absolute: 0 10*3/uL (ref 0.0–0.1)
Basophils Relative: 0 %
Eosinophils Absolute: 0 10*3/uL (ref 0.0–0.5)
Eosinophils Relative: 0 %
HCT: 36.4 % — ABNORMAL LOW (ref 39.0–52.0)
Hemoglobin: 12.5 g/dL — ABNORMAL LOW (ref 13.0–17.0)
Immature Granulocytes: 2 %
Lymphocytes Relative: 4 %
Lymphs Abs: 0.7 10*3/uL (ref 0.7–4.0)
MCH: 29.8 pg (ref 26.0–34.0)
MCHC: 34.3 g/dL (ref 30.0–36.0)
MCV: 86.9 fL (ref 80.0–100.0)
Monocytes Absolute: 0.7 10*3/uL (ref 0.1–1.0)
Monocytes Relative: 4 %
Neutro Abs: 15.9 10*3/uL — ABNORMAL HIGH (ref 1.7–7.7)
Neutrophils Relative %: 90 %
Platelets: 328 10*3/uL (ref 150–400)
RBC: 4.19 MIL/uL — ABNORMAL LOW (ref 4.22–5.81)
RDW: 12.9 % (ref 11.5–15.5)
WBC: 17.6 10*3/uL — ABNORMAL HIGH (ref 4.0–10.5)
nRBC: 0 % (ref 0.0–0.2)

## 2020-08-05 LAB — GLUCOSE, CAPILLARY
Glucose-Capillary: 277 mg/dL — ABNORMAL HIGH (ref 70–99)
Glucose-Capillary: 378 mg/dL — ABNORMAL HIGH (ref 70–99)
Glucose-Capillary: 332 mg/dL — ABNORMAL HIGH (ref 70–99)
Glucose-Capillary: 202 mg/dL — ABNORMAL HIGH (ref 70–99)

## 2020-08-05 LAB — D-DIMER, QUANTITATIVE: D-Dimer, Quant: 0.45 ug/mL-FEU (ref 0.00–0.50)

## 2020-08-05 MED ORDER — POTASSIUM CHLORIDE CRYS ER 20 MEQ PO TBCR
40.0000 meq | EXTENDED_RELEASE_TABLET | Freq: Once | ORAL | Status: AC
  Administered 2020-08-05: 40 meq via ORAL
  Filled 2020-08-05: qty 2

## 2020-08-05 MED ORDER — INSULIN ASPART 100 UNIT/ML ~~LOC~~ SOLN
6.0000 [IU] | Freq: Three times a day (TID) | SUBCUTANEOUS | Status: DC
  Administered 2020-08-05 (×3): 6 [IU] via SUBCUTANEOUS

## 2020-08-05 NOTE — Progress Notes (Signed)
Physical Therapy Treatment Patient Details Name: Justin Galvan MRN: [id number] DOB: 29-May-1960 Today's Date: 08/05/2020    History of Present Illness 60 y.o. male presented via [facility] with increasing shortness of breath cough. Pt unvaccinated with positive home COVID test approx 4/2 Pt reports SaO2 using home pulse ox in the 80s. c/o cough, SoB, loss of taste and watery diarrhea. PCP recommended pt go to hospital. Pt reports "I had to go to work" working for 2 days with [employer]. All family members COVID+ last week. In ED Chest x-ray compatible with multifocal pneumonia, compatible with COVID-19 pneumonia. O2 saturation 84% with minimal activity. Stablized on 2 L. BP elevated. Admitted for treatment of COVID PNA 08/02/20 PMH: CAD, ischemic cardiomyopathy with chronic systolic CHF LVEF 37% (2020), HTN, IDDM, HLD,    PT Comments    Pt is sitting EoB on entry, happy for some company, agreeable to walking in room. Pt is supervision for ambulation of 200 feet in room without AD. At end of ambulation pt SaO2 on RA 99%O2. Able to inhale 2375 mL with incentive spirometer. D/c plans remain appropriate. Pt hopeful for d/c home tomorrow.    Follow Up Recommendations  No PT follow up;Supervision for mobility/OOB     Equipment Recommendations  None recommended by PT       Precautions / Restrictions Precautions Precautions: None Restrictions Weight Bearing Restrictions: No    Mobility  Bed Mobility               General bed mobility comments: sitting on EoB on entry    Transfers Overall transfer level: Independent                  Ambulation/Gait Ambulation/Gait assistance: Supervision Gait Distance (Feet): 200 Feet Assistive device: None Gait Pattern/deviations: WFL(Within Functional Limits);Step-through pattern Gait velocity: slowed Gait velocity interpretation: 1.31 - 2.62 ft/sec, indicative of limited community ambulator General Gait Details: slow, steady  gait (steady gait and turns for ambulation in room)      Balance Overall balance assessment: Independent                                          Cognition Arousal/Alertness: Awake/alert Behavior During Therapy: WFL for tasks assessed/performed Overall Cognitive Status: Within Functional Limits for tasks assessed                                        Exercises Other Exercises Other Exercises: ISx5 2375 mL max    General Comments General comments (skin integrity, edema, etc.): Pt on RA SaO2 with ambulation 99%O2      Pertinent Vitals/Pain Pain Assessment: No/denies pain           PT Goals (current goals can now be found in the care plan section) Acute Rehab PT Goals Patient Stated Goal: go home PT Goal Formulation: With patient Time For Goal Achievement: 08/17/20 Potential to Achieve Goals: Good    Frequency    Min 3X/week      PT Plan Current plan remains appropriate       AM-PAC PT "6 Clicks" Mobility   Outcome Measure  Help needed turning from your back to your side while in a flat bed without using bedrails?: None Help needed moving from lying on your back to sitting  on the side of a flat bed without using bedrails?: None Help needed moving to and from a bed to a chair (including a wheelchair)?: None Help needed standing up from a chair using your arms (e.g., wheelchair or bedside chair)?: None Help needed to walk in hospital room?: None Help needed climbing 3-5 steps with a railing? : A Little 6 Click Score: 23    End of Session   Activity Tolerance: Patient tolerated treatment well Patient left: in chair;with call bell/phone within reach Nurse Communication: Mobility status PT Visit Diagnosis: Difficulty in walking, not elsewhere classified (R26.2)     Time: 7017-7939 PT Time Calculation (min) (ACUTE ONLY): 13 min  Charges:  $Therapeutic Exercise: 8-22 mins                     Anquinette Pierro B. Beverely Risen PT,  DPT Acute Rehabilitation Services Pager [phone] Office [phone]    Elon Alas [facility] 08/05/2020, 2:33 PM

## 2020-08-05 NOTE — Progress Notes (Signed)
PROGRESS NOTE        PATIENT DETAILS Name: Justin Galvan Age: 60 y.o. Sex: male Date of Birth: 1961/04/13 Admit Date: 08/02/2020 Admitting Physician Emeline General, MD ZOX:[id number], Bernadene Bell, MD  Brief Narrative: Patient is a 60 y.o. male with history of chronic systolic heart failure due to ischemic cardiomyopathy, CAD, DM-2, HLD-who presented with worsening shortness of breath-patient was found to have acute hypoxic respiratory failure due to COVID-19 pneumonia.  See below for further details.  Vaccination status: Unvaccinated  Significant events: 4/12>> admit to [facility] due to COVID-19 pneumonia  Significant studies: 08/02/2020>> chest x-ray: Multifocal pneumonia 08/03/2020>> chest x-ray: Multifocal pneumonia 08/03/2020>> TTE: EF 50-55%, basal to mid inferior wall is hypokinetic, grade 1 diastolic dysfunction 04/23/2015>> EF 45%, mid to basal inferior hypokinesis  Antimicrobial therapy: Remdesivir: 04/12>>  Microbiology data: 4/12>> blood culture: No growth  Procedures : None  Consults: None  DVT Prophylaxis : enoxaparin (LOVENOX) injection 40 mg Start: 08/02/20 1800   Subjective: Feels much better-titrated to room air earlier this morning.  Assessment/Plan: Acute hypoxic respiratory failure due to COVID-19 pneumonia: Improving-titrated to room air-CRP continues to downtrend-change steroids to prednisone-we will complete Remdesivir on 4/16.  Ambulate today to see how he does-if clinical improvement continues-possible discharge on 4/16.     Recent Labs    08/02/20 1257 08/03/20 0104 08/04/20 0106 08/05/20 0042  DDIMER 0.67* 0.71* 0.66* 0.45  FERRITIN 424* 420*  --   --   LDH 266*  --   --   --   CRP 13.3* 13.5* 5.1* 2.3*    Lab Results  Component Value Date   SARSCOV2NAA POSITIVE (A) 08/02/2020    Acute on chronic systolic heart failure: Volume status stable-continue furosemide.  .  Echo on 4/13 with improved EF when compared  to prior.  History of CAD s/p PCI: No anginal symptoms-on aspirin/statin/beta-blocker  DM-2 (A1c 7.9 on 3/17) with uncontrolled hyperglycemia due to steroids: CBGs are starting to improve-now that patient is no longer on Solu-Medrol-continue Lantus 30 units, Premeal NovoLog and SSI-suspect that may need to change regimen-as patient no longer on higher dosing of steroids.  Watch closely.  Recent Labs    08/04/20 2002 08/04/20 2253 08/05/20 0750  GLUCAP 346* 241* 277*    HTN: BP stable-on nebivolol and Avapro  HLD: Continue Vascepa and statin  Hypokalemia: Replete and recheck.   Diet: Diet Order            Diet heart healthy/carb modified Room service appropriate? Yes; Fluid consistency: Thin; Fluid restriction: 1800 mL Fluid  Diet effective now                  Code Status: Full code   Family Communication: Spoke with spouse-[phone]- on 4/15.  Disposition Plan: Status is: Inpatient  Remains inpatient appropriate because:Inpatient level of care appropriate due to severity of illness   Dispo: The patient is from: Home              Anticipated d/c is to: Home              Patient currently is not medically stable to d/c.   Difficult to place patient No   Barriers to Discharge: Hypoxia requiring O2 supplementation-we will complete Remdesivir on 4/16.  Antimicrobial agents: Anti-infectives (From admission, onward)   Start     Dose/Rate Route Frequency  Ordered Stop   08/03/20 1000  remdesivir 100 mg in sodium chloride 0.9 % 100 mL IVPB       "Followed by" Linked Group Details   100 mg 200 mL/hr over 30 Minutes Intravenous Daily 08/02/20 1256 08/07/20 0959   08/02/20 1300  remdesivir 200 mg in sodium chloride 0.9% 250 mL IVPB       "Followed by" Linked Group Details   200 mg 580 mL/hr over 30 Minutes Intravenous Once 08/02/20 1256 08/02/20 1559       Time spent: 25- minutes-Greater than 50% of this time was spent in counseling, explanation of  diagnosis, planning of further management, and coordination of care.  MEDICATIONS: Scheduled Meds: . aspirin EC  81 mg Oral Daily  . atorvastatin  80 mg Oral q1800  . benzonatate  200 mg Oral TID  . enoxaparin (LOVENOX) injection  40 mg Subcutaneous Q24H  . furosemide  40 mg Oral Daily  . guaiFENesin  1,200 mg Oral BID  . icosapent Ethyl  2 g Oral BID  . insulin aspart  0-20 Units Subcutaneous TID WC  . insulin aspart  0-5 Units Subcutaneous QHS  . insulin aspart  6 Units Subcutaneous TID WC  . insulin glargine  30 Units Subcutaneous Daily  . irbesartan  300 mg Oral Daily  . nebivolol  5 mg Oral Daily  . PARoxetine  40 mg Oral Daily  . predniSONE  50 mg Oral Daily  . sodium chloride flush  3 mL Intravenous Q12H  . terazosin  2 mg Oral QHS   Continuous Infusions: . sodium chloride    . remdesivir 100 mg in NS 100 mL 100 mg (08/05/20 0932)   PRN Meds:.sodium chloride, acetaminophen, Ipratropium-Albuterol, ondansetron (ZOFRAN) IV, sodium chloride flush   PHYSICAL EXAM: Vital signs: Vitals:   08/05/20 0019 08/05/20 0357 08/05/20 0642 08/05/20 0748  BP: (!) 104/50 (!) 118/58  139/63  Pulse: 60 65  (!) 44  Resp: Temp: 98 F (36.7 C) 97.9 F (36.6 C)  97.7 F (36.5 C)  TempSrc: Oral Oral  Oral  SpO2: 98% 90%  93%  Weight:   76.5 kg   Height:       Filed Weights   08/02/20 1200 08/04/20 0004 08/05/20 0642  Weight: 78.9 kg 77.8 kg 76.5 kg   Body mass index is 26.41 kg/m.   Gen Exam:Alert awake-not in any distress HEENT:atraumatic, normocephalic Chest: B/L clear to auscultation anteriorly CVS:S1S2 regular Abdomen:soft non tender, non distended Extremities:no edema Neurology: Non focal Skin: no rash I have personally reviewed following labs and imaging studies  LABORATORY DATA: CBC: Recent Labs  Lab 08/02/20 1257 08/03/20 0104 08/04/20 0106 08/05/20 0042  WBC 4.4 3.1* 14.0* 17.6*  NEUTROABS 3.1 2.6 12.5* 15.9*  HGB 13.9 13.5 12.9* 12.5*  HCT  41.2 38.6* 37.1* 36.4*  MCV 87.8 86.5 85.9 86.9  PLT 221 231 295 328    Basic Metabolic Panel: Recent Labs  Lab 08/02/20 1257 08/03/20 0104 08/04/20 0106 08/05/20 0042  NA 137 135 133* 133*  K 4.0 3.8 3.4* 3.5  CL 102 99 98 103  CO2 GLUCOSE 245* 369* 311* 304*  BUN 18 22 42* 44*  CREATININE 0.97 0.93 1.04 0.95  CALCIUM 8.7* 8.6* 8.6* 8.4*  MG  --  1.9 2.2  --   PHOS  --  3.1  --   --     GFR: Estimated Creatinine Clearance: 75.4 mL/min (by C-G  formula based on SCr of 0.95 mg/dL).  Liver Function Tests: Recent Labs  Lab 08/02/20 1257 08/03/20 0104 08/04/20 0106 08/05/20 0042  AST 30 31 34 36  ALT 33 33 39 46*  ALKPHOS 67 69 62 61  BILITOT 0.8 0.8 0.6 0.6  PROT 6.5 6.4* 5.9* 6.0*  ALBUMIN 3.3* 3.1* 2.9* 2.9*   No results for input(s): LIPASE, AMYLASE in the last 168 hours. No results for input(s): AMMONIA in the last 168 hours.  Coagulation Profile: No results for input(s): INR, PROTIME in the last 168 hours.  Cardiac Enzymes: No results for input(s): CKTOTAL, CKMB, CKMBINDEX, TROPONINI in the last 168 hours.  BNP (last 3 results) No results for input(s): PROBNP in the last 8760 hours.  Lipid Profile: Recent Labs    08/02/20 1319  TRIG 324*    Thyroid Function Tests: No results for input(s): TSH, T4TOTAL, FREET4, T3FREE, THYROIDAB in the last 72 hours.  Anemia Panel: Recent Labs    08/02/20 1257 08/03/20 0104  FERRITIN 424* 420*    Urine analysis:    Component Value Date/Time   COLORURINE YELLOW 07/07/2020 1036   APPEARANCEUR CLEAR 07/07/2020 1036   LABSPEC 1.020 07/07/2020 1036   PHURINE 5.5 07/07/2020 1036   GLUCOSEU >=1000 (A) 07/07/2020 1036   HGBUR NEGATIVE 07/07/2020 1036   BILIRUBINUR NEGATIVE 07/07/2020 1036   KETONESUR NEGATIVE 07/07/2020 1036   UROBILINOGEN 0.2 07/07/2020 1036   NITRITE NEGATIVE 07/07/2020 1036   LEUKOCYTESUR NEGATIVE 07/07/2020 1036    Sepsis Labs: Lactic Acid, Venous    Component Value  Date/Time   LATICACIDVEN 2.0 (HH) 08/02/2020 1449    MICROBIOLOGY: Recent Results (from the past 240 hour(s))  Resp Panel by RT-PCR (Flu A&B, Covid) Nasopharyngeal Swab     Status: Abnormal   Collection Time: 08/02/20 12:40 PM   Specimen: Nasopharyngeal Swab; Nasopharyngeal(NP) swabs in vial transport medium  Result Value Ref Range Status   SARS Coronavirus 2 by RT PCR POSITIVE (A) NEGATIVE Final    Comment: CRITICAL RESULT CALLED TO, READ BACK BY AND VERIFIED WITH: RN ALBERTA CRTALES AT 1623 ON 08/02/20 BYH KJ (NOTE) SARS-CoV-2 target nucleic acids are DETECTED.  The SARS-CoV-2 RNA is generally detectable in upper respiratory specimens during the acute phase of infection. Positive results are indicative of the presence of the identified virus, but do not rule out bacterial infection or co-infection with other pathogens not detected by the test. Clinical correlation with patient history and other diagnostic information is necessary to determine patient infection status. The expected result is Negative.  Fact Sheet for Patients: BloggerCourse.com  Fact Sheet for Healthcare Providers: SeriousBroker.it  This test is not yet approved or cleared by the [place] FDA and  has been authorized for detection and/or diagnosis of SARS-CoV-2 by FDA under an Emergency Use Authorization (EUA).  This EUA will remain in effect (mean ing this test can be used) for the duration of  the COVID-19 declaration under Section 564(b)(1) of the Act, 21 U.S.C. section 360bbb-3(b)(1), unless the authorization is terminated or revoked sooner.     Influenza A by PCR NEGATIVE NEGATIVE Final   Influenza B by PCR NEGATIVE NEGATIVE Final    Comment: (NOTE) The Xpert Xpress SARS-CoV-2/FLU/RSV plus assay is intended as an aid in the diagnosis of influenza from Nasopharyngeal swab specimens and should not be used as a sole basis for treatment. Nasal washings  and aspirates are unacceptable for Xpert Xpress SARS-CoV-2/FLU/RSV testing.  Fact Sheet for Patients: BloggerCourse.com  Fact Sheet for Healthcare  Providers: SeriousBroker.it  This test is not yet approved or cleared by the [place] and has been authorized for detection and/or diagnosis of SARS-CoV-2 by FDA under an Emergency Use Authorization (EUA). This EUA will remain in effect (meaning this test can be used) for the duration of the COVID-19 declaration under Section 564(b)(1) of the Act, 21 U.S.C. section 360bbb-3(b)(1), unless the authorization is terminated or revoked.  Performed at [facility], [address] [place], [place] [address]   Blood Culture (routine x 2)     Status: None (Preliminary result)   Collection Time: 08/02/20 12:49 PM   Specimen: BLOOD  Result Value Ref Range Status   Specimen Description BLOOD SITE NOT SPECIFIED  Final   Special Requests   Final    BOTTLES DRAWN AEROBIC AND ANAEROBIC Blood Culture adequate volume   Culture   Final    NO GROWTH 3 DAYS Performed at [facility], [address] [place], [place] [address]    Report Status PENDING  Incomplete  Blood Culture (routine x 2)     Status: None (Preliminary result)   Collection Time: 08/02/20  1:08 PM   Specimen: BLOOD  Result Value Ref Range Status   Specimen Description BLOOD LEFT ANTECUBITAL  Final   Special Requests   Final    BOTTLES DRAWN AEROBIC AND ANAEROBIC Blood Culture results may not be optimal due to an excessive volume of blood received in culture bottles   Culture   Final    NO GROWTH 3 DAYS Performed at [facility], [address], [place], [place] [address]    Report Status PENDING  Incomplete    RADIOLOGY STUDIES/RESULTS: ECHOCARDIOGRAM COMPLETE  Result Date: 08/03/2020    ECHOCARDIOGRAM REPORT   Patient Name:   AVRY ROEDL Date of Exam: 08/03/2020 Medical Rec #:  [id number]         Height:       67.0 in Accession #:    [id number]       Weight:       174.0 lb Date of Birth:  1960/12/24         BSA:          1.906 m Patient Age:    60 years         BP:           116/54 mmHg Patient Gender: M                HR:           54 bpm. Exam Location:  Inpatient Procedure: 2D Echo, Cardiac Doppler and Color Doppler Indications:    [place]re respiratory distress R06.03  History:        Patient has prior history of Echocardiogram examinations, most                 recent 04/23/2015. Previous Myocardial Infarction and CAD; Risk                 Factors:Hypertension, Diabetes and Dyslipidemia.  Sonographer:    Eulah Pont RDCS Referring Phys: Sharla Kidney Werner Lean [facility] IMPRESSIONS  1. Left ventricular ejection fraction, by estimation, is 50 to 55%. The left ventricle has low normal function. The left ventricle demonstrates regional wall motion abnormalities (see scoring diagram/findings for description). The basal-to-mid inferior wall is hypokinetic. Left ventricular diastolic parameters are consistent with Grade I diastolic dysfunction (impaired relaxation).  2. Right ventricular systolic function is normal. The right  ventricular size is normal. Tricuspid regurgitation signal is inadequate for assessing PA pressure.  3. The mitral valve is normal in structure. Trivial mitral valve regurgitation. No evidence of mitral stenosis.  4. The aortic valve is tricuspid. There is mild calcification of the aortic valve. There is mild thickening of the aortic valve. Aortic valve regurgitation is not visualized. Mild aortic valve sclerosis is present, with no evidence of aortic valve stenosis.  5. The inferior vena cava is normal in size with <50% respiratory variability, suggesting right atrial pressure of 8 mmHg. Comparison(s): Compared to prior TTE in 2016, the LVEF is now slightly improved to 50-55%. There continues to be basal-to-mid inferior wall hypokinesis. FINDINGS  Left Ventricle: Left ventricular ejection fraction,  by estimation, is 50 to 55%. The left ventricle has low normal function. The left ventricle demonstrates regional wall motion abnormalities. The basal-to-mid inferior wall is hypokinetic. The left ventricular internal cavity size was normal in size. There is no left ventricular hypertrophy. Left ventricular diastolic parameters are consistent with Grade I diastolic dysfunction (impaired relaxation). Right Ventricle: The right ventricular size is normal. No increase in right ventricular wall thickness. Right ventricular systolic function is normal. Tricuspid regurgitation signal is inadequate for assessing PA pressure. Left Atrium: Left atrial size was normal in size. Right Atrium: Right atrial size was normal in size. Pericardium: There is no evidence of pericardial effusion. Mitral Valve: The mitral valve is normal in structure. There is mild thickening of the mitral valve leaflet(s). Trivial mitral valve regurgitation. No evidence of mitral valve stenosis. Tricuspid Valve: The tricuspid valve is normal in structure. Tricuspid valve regurgitation is trivial. Aortic Valve: The aortic valve is tricuspid. There is mild calcification of the aortic valve. There is mild thickening of the aortic valve. Aortic valve regurgitation is not visualized. Mild aortic valve sclerosis is present, with no evidence of aortic valve stenosis. Pulmonic Valve: The pulmonic valve was normal in structure. Pulmonic valve regurgitation is not visualized. Aorta: The aortic root and ascending aorta are structurally normal, with no evidence of dilitation. Venous: The inferior vena cava is normal in size with less than 50% respiratory variability, suggesting right atrial pressure of 8 mmHg. IAS/Shunts: No atrial level shunt detected by color flow Doppler.  LEFT VENTRICLE PLAX 2D LVIDd:         5.30 cm  Diastology LVIDs:         3.80 cm  LV e' medial:    6.87 cm/s LV PW:         0.90 cm  LV E/e' medial:  10.1 LV IVS:        0.90 cm  LV e'  lateral:   13.10 cm/s LVOT diam:     2.10 cm  LV E/e' lateral: 5.3 LV SV:         94 LV SV Index:   49 LVOT Area:     3.46 cm  RIGHT VENTRICLE RV S prime:     9.90 cm/s TAPSE (M-mode): 1.9 cm LEFT ATRIUM             Index       RIGHT ATRIUM           Index LA diam:        3.90 cm 2.05 cm/m  RA Area:     13.20 cm LA Vol (A2C):   59.2 ml 31.06 ml/m RA Volume:   33.20 ml  17.42 ml/m LA Vol (A4C):   60.4 ml 31.69 ml/m LA Biplane Vol:  60.7 ml 31.85 ml/m  AORTIC VALVE LVOT Vmax:   120.00 cm/s LVOT Vmean:  82.000 cm/s LVOT VTI:    0.270 m  AORTA Ao Root diam: 3.10 cm Ao Asc diam:  3.00 cm MITRAL VALVE MV Area (PHT): 3.12 cm    SHUNTS MV Decel Time: 243 msec    Systemic VTI:  0.27 m MV E velocity: 69.60 cm/s  Systemic Diam: 2.10 cm MV A velocity: 68.60 cm/s MV E/A ratio:  1.01 Laurance FlattenHeather Pemberton MD Electronically signed by Laurance FlattenHeather Pemberton MD Signature Date/Time: 08/03/2020/11:33:35 AM    Final      LOS: 3 days   Jeoffrey MassedShanker Lusero Nordlund, MD  [facility]    To contact the attending provider between 7A-7P or the covering provider during after hours 7P-7A, please log into the web site [email or web address] and access using universal [place] password for that web site. If you do not have the password, please call the hospital operator.  08/05/2020, 10:13 AM

## 2020-08-06 DIAGNOSIS — I70209 Unspecified atherosclerosis of native arteries of extremities, unspecified extremity: Secondary | ICD-10-CM

## 2020-08-06 DIAGNOSIS — E1151 Type 2 diabetes mellitus with diabetic peripheral angiopathy without gangrene: Secondary | ICD-10-CM

## 2020-08-06 LAB — BASIC METABOLIC PANEL
Anion gap: 6 (ref 5–15)
BUN: 33 mg/dL — ABNORMAL HIGH (ref 8–23)
CO2: 26 mmol/L (ref 22–32)
Calcium: 8.4 mg/dL — ABNORMAL LOW (ref 8.9–10.3)
Chloride: 106 mmol/L (ref 98–111)
Creatinine, Ser: 0.76 mg/dL (ref 0.61–1.24)
GFR, Estimated: >60 mL/min (ref >60)
Glucose, Bld: 160 mg/dL — ABNORMAL HIGH (ref 70–99)
Potassium: 3.7 mmol/L (ref 3.5–5.1)
Sodium: 138 mmol/L (ref 135–145)

## 2020-08-06 LAB — CBC WITH DIFFERENTIAL/PLATELET
Abs Immature Granulocytes: 0.19 10*3/uL — ABNORMAL HIGH (ref 0.00–0.07)
Basophils Absolute: 0 10*3/uL (ref 0.0–0.1)
Basophils Relative: 0 %
Eosinophils Absolute: 0 10*3/uL (ref 0.0–0.5)
Eosinophils Relative: 0 %
HCT: 37.6 % — ABNORMAL LOW (ref 39.0–52.0)
Hemoglobin: 12.9 g/dL — ABNORMAL LOW (ref 13.0–17.0)
Immature Granulocytes: 1 %
Lymphocytes Relative: 5 %
Lymphs Abs: 0.8 10*3/uL (ref 0.7–4.0)
MCH: 29.5 pg (ref 26.0–34.0)
MCHC: 34.3 g/dL (ref 30.0–36.0)
MCV: 86 fL (ref 80.0–100.0)
Monocytes Absolute: 1 10*3/uL (ref 0.1–1.0)
Monocytes Relative: 6 %
Neutro Abs: 15.3 10*3/uL — ABNORMAL HIGH (ref 1.7–7.7)
Neutrophils Relative %: 88 %
Platelets: 339 10*3/uL (ref 150–400)
RBC: 4.37 MIL/uL (ref 4.22–5.81)
RDW: 12.8 % (ref 11.5–15.5)
WBC: 17.2 10*3/uL — ABNORMAL HIGH (ref 4.0–10.5)
nRBC: 0 % (ref 0.0–0.2)

## 2020-08-06 LAB — GLUCOSE, CAPILLARY
Glucose-Capillary: 126 mg/dL — ABNORMAL HIGH (ref 70–99)
Glucose-Capillary: 309 mg/dL — ABNORMAL HIGH (ref 70–99)

## 2020-08-06 MED ORDER — PREDNISONE 10 MG PO TABS: ORAL_TABLET | ORAL | 0 refills | Status: DC

## 2020-08-06 MED ORDER — BENZONATATE 100 MG PO CAPS: 100.0000 mg | ORAL_CAPSULE | Freq: Three times a day (TID) | ORAL | 0 refills | Status: DC | PRN

## 2020-08-06 MED ORDER — PREDNISONE 20 MG PO TABS
40.0000 mg | ORAL_TABLET | Freq: Every day | ORAL | Status: DC
  Administered 2020-08-06: 40 mg via ORAL

## 2020-08-06 MED ORDER — ALBUTEROL SULFATE HFA 108 (90 BASE) MCG/ACT IN AERS: 2.0000 | INHALATION_SPRAY | Freq: Four times a day (QID) | RESPIRATORY_TRACT | 0 refills | Status: DC | PRN

## 2020-08-06 MED ORDER — INSULIN GLARGINE 100 UNIT/ML ~~LOC~~ SOLN
10.0000 [IU] | Freq: Every day | SUBCUTANEOUS | Status: DC
  Administered 2020-08-06: 10 [IU] via SUBCUTANEOUS
  Filled 2020-08-06: qty 0.1

## 2020-08-06 NOTE — Discharge Summary (Signed)
PATIENT DETAILS Name: Justin Galvan Age: 60 y.o. Sex: male Date of Birth: 1960/11/28 MRN: [id number]. Admitting Physician: Emeline General, MD WUJ:[id number], Bernadene Bell, MD  Admit Date: 08/02/2020 Discharge date: 08/06/2020  Recommendations for Outpatient Follow-up:  1. Follow up with PCP in 1-2 weeks 2. Please obtain CMP/CBC in one week 3. Repeat Chest Xray in 4-6 week  Admitted From:  Home  Disposition: Home   Home Health: No  Equipment/Devices: None  Discharge Condition: Stable  CODE STATUS: FULL CODE  Diet recommendation:  Diet Order            Diet - low sodium heart healthy           Diet Carb Modified           Diet heart healthy/carb modified Room service appropriate? Yes; Fluid consistency: Thin; Fluid restriction: 1800 mL Fluid  Diet effective now                  Brief Narrative: Patient is a 60 y.o. male with history of chronic systolic heart failure due to ischemic cardiomyopathy, CAD, DM-2, HLD-who presented with worsening shortness of breath-patient was found to have acute hypoxic respiratory failure due to COVID-19 pneumonia.  See below for further details.  Vaccination status: Unvaccinated  Significant events: 4/12>> admit to [facility] due to COVID-19 pneumonia  Significant studies: 08/02/2020>> chest x-ray: Multifocal pneumonia 08/03/2020>> chest x-ray: Multifocal pneumonia 08/03/2020>> TTE: EF 50-55%, basal to mid inferior wall is hypokinetic, grade 1 diastolic dysfunction 04/23/2015>> EF 45%, mid to basal inferior hypokinesis  Antimicrobial therapy: Remdesivir: 04/12>>  Microbiology data: 4/12>> blood culture: No growth  Procedures : None  Consults: None  Brief Hospital Course: Acute hypoxic respiratory failure due to COVID-19 pneumonia:  Significantly improved-on room air-treated with steroid/Remdesivir.  Will complete his last dose of Remdesivir on 4/16-following which she will be discharged on tapering prednisone.   Encourage patient to continue isolation for total of 21 days-and to get vaccinated approximately in the month.   COVID-19 Labs:  Recent Labs    08/04/20 0106 08/05/20 0042  DDIMER 0.66* 0.45  CRP 5.1* 2.3*    Lab Results  Component Value Date   SARSCOV2NAA POSITIVE (A) 08/02/2020     Acute on chronic systolic heart failure: Volume status stable-continue his usual diuretic regimen on discharge .  Echo on 4/13 with improved EF when compared to prior.  History of CAD s/p PCI: No anginal symptoms-on aspirin/statin/beta-blocker  DM-2 (A1c 7.9 on 3/17) with uncontrolled hyperglycemia due to steroids: Uncontrolled as the patient was on IV steroids-however did have markedly improved-plan is to place him back on his usual diabetic regimen on discharge.  Follow with PCP for further continued care.    HTN: BP stable-on nebivolol and Avapro  HLD: Continue Vascepa and statin  Hypokalemia: Repleted  Discharge Diagnoses:  Active Problems:   COVID-19 virus infection   COVID-19   Discharge Instructions:    Person Under Monitoring Name: BURNS TIMSON  Location: [address] [place] [address]   Infection Prevention Recommendations for Individuals Confirmed to have, or Being Evaluated for, 2019 Novel Coronavirus (COVID-19) Infection Who Receive Care at Home  Individuals who are confirmed to have, or are being evaluated for, COVID-19 should follow the prevention steps below until a healthcare provider or local or state health department says they can return to normal activities.  Stay home except to get medical care You should restrict activities outside your home, except for getting medical  care. Do not go to work, school, or public areas, and do not use public transportation or taxis.  Call ahead before visiting your doctor Before your medical appointment, call the healthcare provider and tell them that you have, or are being evaluated for, COVID-19  infection. This will help the healthcare provider's office take steps to keep other people from getting infected. Ask your healthcare provider to call the local or state health department.  Monitor your symptoms Seek prompt medical attention if your illness is worsening (e.g., difficulty breathing). Before going to your medical appointment, call the healthcare provider and tell them that you have, or are being evaluated for, COVID-19 infection. Ask your healthcare provider to call the local or state health department.  Wear a facemask You should wear a facemask that covers your nose and mouth when you are in the same room with other people and when you visit a healthcare provider. People who live with or visit you should also wear a facemask while they are in the same room with you.  Separate yourself from other people in your home As much as possible, you should stay in a different room from other people in your home. Also, you should use a separate bathroom, if available.  Avoid sharing household items You should not share dishes, drinking glasses, cups, eating utensils, towels, bedding, or other items with other people in your home. After using these items, you should wash them thoroughly with soap and water.  Cover your coughs and sneezes Cover your mouth and nose with a tissue when you cough or sneeze, or you can cough or sneeze into your sleeve. Throw used tissues in a lined trash can, and immediately wash your hands with soap and water for at least 20 seconds or use an alcohol-based hand rub.  Wash your [employer]hands Wash your hands often and thoroughly with soap and water for at least 20 seconds. You can use an alcohol-based hand sanitizer if soap and water are not available and if your hands are not visibly dirty. Avoid touching your eyes, nose, and mouth with unwashed hands.   Prevention Steps for Caregivers and Household Members of Individuals Confirmed to have, or Being Evaluated  for, COVID-19 Infection Being Cared for in the Home  If you live with, or provide care at home for, a person confirmed to have, or being evaluated for, COVID-19 infection please follow these guidelines to prevent infection:  Follow healthcare provider's instructions Make sure that you understand and can help the patient follow any healthcare provider instructions for all care.  Provide for the patient's basic needs You should help the patient with basic needs in the home and provide support for getting groceries, prescriptions, and other personal needs.  Monitor the patient's symptoms If they are getting sicker, call his or her medical provider and tell them that the patient has, or is being evaluated for, COVID-19 infection. This will help the healthcare provider's office take steps to keep other people from getting infected. Ask the healthcare provider to call the local or state health department.  Limit the number of people who have contact with the patient  If possible, have only one caregiver for the patient.  Other household members should stay in another home or place of residence. If this is not possible, they should stay  in another room, or be separated from the patient as much as possible. Use a separate bathroom, if available.  Restrict visitors who do not have an essential need to  be in the home.  Keep older adults, very young children, and other sick people away from the patient Keep older adults, very young children, and those who have compromised immune systems or chronic health conditions away from the patient. This includes people with chronic heart, lung, or kidney conditions, diabetes, and cancer.  Ensure good ventilation Make sure that shared spaces in the home have good air flow, such as from an air conditioner or an opened window, weather permitting.  Wash your hands often  Wash your hands often and thoroughly with soap and water for at least 20 seconds. You  can use an alcohol based hand sanitizer if soap and water are not available and if your hands are not visibly dirty.  Avoid touching your eyes, nose, and mouth with unwashed hands.  Use disposable paper towels to dry your hands. If not available, use dedicated cloth towels and replace them when they become wet.  Wear a facemask and gloves  Wear a disposable facemask at all times in the room and gloves when you touch or have contact with the patient's blood, body fluids, and/or secretions or excretions, such as sweat, saliva, sputum, nasal mucus, vomit, urine, or feces.  Ensure the mask fits over your nose and mouth tightly, and do not touch it during use.  Throw out disposable facemasks and gloves after using them. Do not reuse.  Wash your hands immediately after removing your facemask and gloves.  If your personal clothing becomes contaminated, carefully remove clothing and launder. Wash your hands after handling contaminated clothing.  Place all used disposable facemasks, gloves, and other waste in a lined container before disposing them with other household waste.  Remove gloves and wash your hands immediately after handling these items.  Do not share dishes, glasses, or other household items with the patient  Avoid sharing household items. You should not share dishes, drinking glasses, cups, eating utensils, towels, bedding, or other items with a patient who is confirmed to have, or being evaluated for, COVID-19 infection.  After the person uses these items, you should wash them thoroughly with soap and water.  Wash laundry thoroughly  Immediately remove and wash clothes or bedding that have blood, body fluids, and/or secretions or excretions, such as sweat, saliva, sputum, nasal mucus, vomit, urine, or feces, on them.  Wear gloves when handling laundry from the patient.  Read and follow directions on labels of laundry or clothing items and detergent. In general, wash and dry with  the warmest temperatures recommended on the label.  Clean all areas the individual has used often  Clean all touchable surfaces, such as counters, tabletops, doorknobs, bathroom fixtures, toilets, phones, keyboards, tablets, and bedside tables, every day. Also, clean any surfaces that may have blood, body fluids, and/or secretions or excretions on them.  Wear gloves when cleaning surfaces the patient has come in contact with.  Use a diluted bleach solution (e.g., dilute bleach with 1 part bleach and 10 parts water) or a household disinfectant with a label that says EPA-registered for coronaviruses. To make a bleach solution at home, add 1 tablespoon of bleach to 1 quart (4 cups) of water. For a larger supply, add  cup of bleach to 1 gallon (16 cups) of water.  Read labels of cleaning products and follow recommendations provided on product labels. Labels contain instructions for safe and effective use of the cleaning product including precautions you should take when applying the product, such as wearing gloves or eye protection and making  sure you have good ventilation during use of the product.  Remove gloves and wash hands immediately after cleaning.  Monitor yourself for signs and symptoms of illness Caregivers and household members are considered close contacts, should monitor their health, and will be asked to limit movement outside of the home to the extent possible. Follow the monitoring steps for close contacts listed on the symptom monitoring form.   ? If you have additional questions, contact your local health department or call the epidemiologist on call at [phone] (available 24/7). ? This guidance is subject to change. For the most up-to-date guidance from CDC, please refer to their website: [employer]Met[employer].hu    Activity:  As tolerated with Full fall precautions use walker/cane & assistance as needed  Discharge  Instructions    Call MD for:  difficulty breathing, headache or visual disturbances   Complete by: As directed    Diet - low sodium heart healthy   Complete by: As directed    Diet Carb Modified   Complete by: As directed    Discharge instructions   Complete by: As directed    Follow with Primary MD  Etta Grandchild, MD in 1-2 weeks  Please get a complete blood count and chemistry panel checked by your Primary MD at your next visit, and again as instructed by your Primary MD.  Get Medicines reviewed and adjusted: Please take all your medications with you for your next visit with your Primary MD  Laboratory/radiological data: Please request your Primary MD to go over all hospital tests and procedure/radiological results at the follow up, please ask your Primary MD to get all Hospital records sent to his/her office.  In some cases, they will be blood work, cultures and biopsy results pending at the time of your discharge. Please request that your primary care M.D. follows up on these results.  Also Note the following: If you experience worsening of your admission symptoms, develop shortness of breath, life threatening emergency, suicidal or homicidal thoughts you must seek medical attention immediately by calling 911 or calling your MD immediately  if symptoms less severe.  You must read complete instructions/literature along with all the possible adverse reactions/side effects for all the Medicines you take and that have been prescribed to you. Take any new Medicines after you have completely understood and accpet all the possible adverse reactions/side effects.   Do not drive when taking Pain medications or sleeping medications (Benzodaizepines)  Do not take more than prescribed Pain, Sleep and Anxiety Medications. It is not advisable to combine anxiety,sleep and pain medications without talking with your primary care practitioner  Special Instructions: If you have smoked or chewed  Tobacco  in the last 2 yrs please stop smoking, stop any regular Alcohol  and or any Recreational drug use.  Wear Seat belts while driving.  Please note: You were cared for by a hospitalist during your hospital stay. Once you are discharged, your primary care physician will handle any further medical issues. Please note that NO REFILLS for any discharge medications will be authorized once you are discharged, as it is imperative that you return to your primary care physician (or establish a relationship with a primary care physician if you do not have one) for your post hospital discharge needs so that they can reassess your need for medications and monitor your lab values.   1.)  21 days of isolation from 08/02/2020 or from the first day of his symptoms.  2.)  If develop worsening  shortness of breath-please seek immediate medical attention.   Increase activity slowly   Complete by: As directed      Allergies as of 08/06/2020   No Known Allergies     Medication List    TAKE these medications   albuterol 108 (90 Base) MCG/ACT inhaler Commonly known as: VENTOLIN HFA Inhale 2 puffs into the lungs every 6 (six) hours as needed for wheezing or shortness of breath.   aspirin 81 MG EC tablet Take 1 tablet (81 mg total) by mouth daily.   atorvastatin 80 MG tablet Commonly known as: LIPITOR TAKE 1 TABLET (80 MG TOTAL) BY MOUTH DAILY. AT 6PM   benzonatate 100 MG capsule Commonly known as: Tessalon Perles Take 1 capsule (100 mg total) by mouth 3 (three) times daily as needed for cough.   Cholecalciferol 50 MCG (2000 UT) Tabs Take 1 tablet (2,000 Units total) by mouth daily. Take with food or whole milk   dapagliflozin propanediol 10 MG Tabs tablet Commonly known as: Farxiga Take 1 tablet (10 mg total) by mouth daily.   glucose blood test strip Test up to two times daily   icosapent Ethyl 1 g capsule Commonly known as: Vascepa Take 2 capsules (2 g total) by mouth 2 (two) times  daily.   indapamide 1.25 MG tablet Commonly known as: LOZOL Take 1 tablet (1.25 mg total) by mouth daily.   irbesartan 300 MG tablet Commonly known as: AVAPRO Take 1 tablet (300 mg total) by mouth daily.   metFORMIN 750 MG 24 hr tablet Commonly known as: GLUCOPHAGE-XR Take 2 tablets (1,500 mg total) by mouth daily with breakfast.   nebivolol 5 MG tablet Commonly known as: BYSTOLIC Take 1 tablet (5 mg total) by mouth daily.   nitroGLYCERIN 0.4 MG SL tablet Commonly known as: NITROSTAT Place 1 tablet (0.4 mg total) under the tongue every 5 (five) minutes as needed for chest pain.   PARoxetine 40 MG tablet Commonly known as: PAXIL Take 1 tablet (40 mg total) by mouth daily.   predniSONE 10 MG tablet Commonly known as: DELTASONE Take 30 mg daily for 1 day, 20 mg daily for 1 days,10 mg daily for 1 day, then stop   tadalafil 20 MG tablet Commonly known as: Cialis Take 1 tablet (20 mg total) by mouth daily as needed for erectile dysfunction.   terazosin 2 MG capsule Commonly known as: HYTRIN Take 1 capsule (2 mg total) by mouth at bedtime.       Follow-up Information    Etta Grandchild, MD. Schedule an appointment as soon as possible for a visit in 1 week(s).   Specialty: Internal Medicine Contact information: [address] [place] [address] [phone]        Sande Rives, MD Follow up in 1 month(s).   Specialties: Internal Medicine, Cardiology, Radiology Contact information: [address] [place] [address] [phone]              No Known Allergies   Other Procedures/Studies: DG Chest Port 1 View  Result Date: 08/03/2020 CLINICAL DATA:  Shortness of breath EXAM: PORTABLE CHEST 1 VIEW COMPARISON:  August 02, 2020 FINDINGS: Ill-defined airspace opacity persists in the right upper lobe. More subtle opacity is noted in each lung bases. No consolidation. Heart size and pulmonary vascularity are normal. No adenopathy. There is  aortic atherosclerosis. No bone lesions. IMPRESSION: Ill-defined areas of airspace opacity, most notable in the right upper lobe. Question atypical organism pneumonia. No consolidation. Stable cardiac  silhouette. Aortic Atherosclerosis (ICD10-I70.0). Electronically Signed   By: Bretta Bang III M.D.   On: 08/03/2020 08:10   DG Chest Port 1 View  Result Date: 08/02/2020 CLINICAL DATA:  Dyspnea. EXAM: PORTABLE CHEST 1 VIEW COMPARISON:  April 22, 2015. FINDINGS: The heart size and mediastinal contours are within normal limits. No pneumothorax or pleural effusion is noted. Multiple patchy airspace opacities are noted throughout both lungs concerning for multifocal pneumonia. The visualized skeletal structures are unremarkable. IMPRESSION: Multiple patchy airspace opacities are noted bilaterally concerning for multifocal pneumonia. Electronically Signed   By: Lupita Raider M.D.   On: 08/02/2020 14:41   ECHOCARDIOGRAM COMPLETE  Result Date: 08/03/2020    ECHOCARDIOGRAM REPORT   Patient Name:   KALIEL BOLDS Date of Exam: 08/03/2020 Medical Rec #:  [id number]        Height:       67.0 in Accession #:    [id number]       Weight:       174.0 lb Date of Birth:  27-Dec-1960         BSA:          1.906 m Patient Age:    60 years         BP:           116/54 mmHg Patient Gender: M                HR:           54 bpm. Exam Location:  Inpatient Procedure: 2D Echo, Cardiac Doppler and Color Doppler Indications:    Acutre respiratory distress R06.03  History:        Patient has prior history of Echocardiogram examinations, most                 recent 04/23/2015. Previous Myocardial Infarction and CAD; Risk                 Factors:Hypertension, Diabetes and Dyslipidemia.  Sonographer:    Eulah Pont RDCS Referring Phys: Sharla Kidney Werner Lean [facility] IMPRESSIONS  1. Left ventricular ejection fraction, by estimation, is 50 to 55%. The left ventricle has low normal function. The left ventricle demonstrates regional wall  motion abnormalities (see scoring diagram/findings for description). The basal-to-mid inferior wall is hypokinetic. Left ventricular diastolic parameters are consistent with Grade I diastolic dysfunction (impaired relaxation).  2. Right ventricular systolic function is normal. The right ventricular size is normal. Tricuspid regurgitation signal is inadequate for assessing PA pressure.  3. The mitral valve is normal in structure. Trivial mitral valve regurgitation. No evidence of mitral stenosis.  4. The aortic valve is tricuspid. There is mild calcification of the aortic valve. There is mild thickening of the aortic valve. Aortic valve regurgitation is not visualized. Mild aortic valve sclerosis is present, with no evidence of aortic valve stenosis.  5. The inferior vena cava is normal in size with <50% respiratory variability, suggesting right atrial pressure of 8 mmHg. Comparison(s): Compared to prior TTE in 2016, the LVEF is now slightly improved to 50-55%. There continues to be basal-to-mid inferior wall hypokinesis. FINDINGS  Left Ventricle: Left ventricular ejection fraction, by estimation, is 50 to 55%. The left ventricle has low normal function. The left ventricle demonstrates regional wall motion abnormalities. The basal-to-mid inferior wall is hypokinetic. The left ventricular internal cavity size was normal in size. There is no left ventricular hypertrophy. Left ventricular diastolic parameters are consistent with Grade I diastolic dysfunction (impaired relaxation). Right  Ventricle: The right ventricular size is normal. No increase in right ventricular wall thickness. Right ventricular systolic function is normal. Tricuspid regurgitation signal is inadequate for assessing PA pressure. Left Atrium: Left atrial size was normal in size. Right Atrium: Right atrial size was normal in size. Pericardium: There is no evidence of pericardial effusion. Mitral Valve: The mitral valve is normal in structure. There is  mild thickening of the mitral valve leaflet(s). Trivial mitral valve regurgitation. No evidence of mitral valve stenosis. Tricuspid Valve: The tricuspid valve is normal in structure. Tricuspid valve regurgitation is trivial. Aortic Valve: The aortic valve is tricuspid. There is mild calcification of the aortic valve. There is mild thickening of the aortic valve. Aortic valve regurgitation is not visualized. Mild aortic valve sclerosis is present, with no evidence of aortic valve stenosis. Pulmonic Valve: The pulmonic valve was normal in structure. Pulmonic valve regurgitation is not visualized. Aorta: The aortic root and ascending aorta are structurally normal, with no evidence of dilitation. Venous: The inferior vena cava is normal in size with less than 50% respiratory variability, suggesting right atrial pressure of 8 mmHg. IAS/Shunts: No atrial level shunt detected by color flow Doppler.  LEFT VENTRICLE PLAX 2D LVIDd:         5.30 cm  Diastology LVIDs:         3.80 cm  LV e' medial:    6.87 cm/s LV PW:         0.90 cm  LV E/e' medial:  10.1 LV IVS:        0.90 cm  LV e' lateral:   13.10 cm/s LVOT diam:     2.10 cm  LV E/e' lateral: 5.3 LV SV:         94 LV SV Index:   49 LVOT Area:     3.46 cm  RIGHT VENTRICLE RV S prime:     9.90 cm/s TAPSE (M-mode): 1.9 cm LEFT ATRIUM             Index       RIGHT ATRIUM           Index LA diam:        3.90 cm 2.05 cm/m  RA Area:     13.20 cm LA Vol (A2C):   59.2 ml 31.06 ml/m RA Volume:   33.20 ml  17.42 ml/m LA Vol (A4C):   60.4 ml 31.69 ml/m LA Biplane Vol: 60.7 ml 31.85 ml/m  AORTIC VALVE LVOT Vmax:   120.00 cm/s LVOT Vmean:  82.000 cm/s LVOT VTI:    0.270 m  AORTA Ao Root diam: 3.10 cm Ao Asc diam:  3.00 cm MITRAL VALVE MV Area (PHT): 3.12 cm    SHUNTS MV Decel Time: 243 msec    Systemic VTI:  0.27 m MV E velocity: 69.60 cm/s  Systemic Diam: 2.10 cm MV A velocity: 68.60 cm/s MV E/A ratio:  1.01 Laurance Flatten MD Electronically signed by Laurance Flatten MD  Signature Date/Time: 08/03/2020/11:33:35 AM    Final      TODAY-DAY OF DISCHARGE:  Subjective:   Murvin Natal today has no headache,no chest abdominal pain,no new weakness tingling or numbness, feels much better wants to go home today.   Objective:   Blood pressure 126/66, pulse (!) 47, temperature 98.3 F (36.8 C), resp. rate 16, height 5\' 7"  (1.702 m), weight 77 kg, SpO2 98 %.  Intake/Output Summary (Last 24 hours) at 08/06/2020 1014 Last data filed at 08/05/2020 2109 Gross per 24 hour  Intake 1953.33 ml  Output --  Net 1953.33 ml   Filed Weights   08/04/20 0004 08/05/20 0642 08/06/20 0405  Weight: 77.8 kg 76.5 kg 77 kg    Exam: Awake Alert, Oriented *3, No new F.N deficits, Normal affect [place].AT,PERRAL Supple Neck,No JVD, No cervical lymphadenopathy appriciated.  Symmetrical Chest wall movement, Good air movement bilaterally, CTAB RRR,No Gallops,Rubs or new Murmurs, No Parasternal Heave +ve B.Sounds, Abd Soft, Non tender, No organomegaly appriciated, No rebound -guarding or rigidity. No Cyanosis, Clubbing or edema, No new Rash or bruise   PERTINENT RADIOLOGIC STUDIES: DG Chest Port 1 View  Result Date: 08/03/2020 CLINICAL DATA:  Shortness of breath EXAM: PORTABLE CHEST 1 VIEW COMPARISON:  August 02, 2020 FINDINGS: Ill-defined airspace opacity persists in the right upper lobe. More subtle opacity is noted in each lung bases. No consolidation. Heart size and pulmonary vascularity are normal. No adenopathy. There is aortic atherosclerosis. No bone lesions. IMPRESSION: Ill-defined areas of airspace opacity, most notable in the right upper lobe. Question atypical organism pneumonia. No consolidation. Stable cardiac silhouette. Aortic Atherosclerosis (ICD10-I70.0). Electronically Signed   By: Bretta Bang III M.D.   On: 08/03/2020 08:10   DG Chest Port 1 View  Result Date: 08/02/2020 CLINICAL DATA:  Dyspnea. EXAM: PORTABLE CHEST 1 VIEW COMPARISON:  April 22, 2015.  FINDINGS: The heart size and mediastinal contours are within normal limits. No pneumothorax or pleural effusion is noted. Multiple patchy airspace opacities are noted throughout both lungs concerning for multifocal pneumonia. The visualized skeletal structures are unremarkable. IMPRESSION: Multiple patchy airspace opacities are noted bilaterally concerning for multifocal pneumonia. Electronically Signed   By: Lupita Raider M.D.   On: 08/02/2020 14:41   ECHOCARDIOGRAM COMPLETE  Result Date: 08/03/2020    ECHOCARDIOGRAM REPORT   Patient Name:   Justin Galvan Date of Exam: 08/03/2020 Medical Rec #:  [id number]        Height:       67.0 in Accession #:    [id number]       Weight:       174.0 lb Date of Birth:  [birth year]         BSA:          1.906 m Patient Age:    60 years         BP:           116/54 mmHg Patient Gender: M                HR:           54 bpm. Exam Location:  Inpatient Procedure: 2D Echo, Cardiac Doppler and Color Doppler Indications:    [place] respiratory distress R06.03  History:        Patient has prior history of Echocardiogram examinations, most                 recent 04/23/2015. Previous Myocardial Infarction and CAD; Risk                 Factors:Hypertension, Diabetes and Dyslipidemia.  Sonographer:    Eulah Pont RDCS Referring Phys: Sharla Kidney Werner Lean [facility] IMPRESSIONS  1. Left ventricular ejection fraction, by estimation, is 50 to 55%. The left ventricle has low normal function. The left ventricle demonstrates regional wall motion abnormalities (see scoring diagram/findings for description). The basal-to-mid inferior wall is hypokinetic. Left ventricular diastolic parameters are consistent with Grade I diastolic dysfunction (impaired relaxation).  2. Right ventricular systolic function is normal. The right  ventricular size is normal. Tricuspid regurgitation signal is inadequate for assessing PA pressure.  3. The mitral valve is normal in structure. Trivial mitral valve regurgitation.  No evidence of mitral stenosis.  4. The aortic valve is tricuspid. There is mild calcification of the aortic valve. There is mild thickening of the aortic valve. Aortic valve regurgitation is not visualized. Mild aortic valve sclerosis is present, with no evidence of aortic valve stenosis.  5. The inferior vena cava is normal in size with <50% respiratory variability, suggesting right atrial pressure of 8 mmHg. Comparison(s): Compared to prior TTE in 2016, the LVEF is now slightly improved to 50-55%. There continues to be basal-to-mid inferior wall hypokinesis. FINDINGS  Left Ventricle: Left ventricular ejection fraction, by estimation, is 50 to 55%. The left ventricle has low normal function. The left ventricle demonstrates regional wall motion abnormalities. The basal-to-mid inferior wall is hypokinetic. The left ventricular internal cavity size was normal in size. There is no left ventricular hypertrophy. Left ventricular diastolic parameters are consistent with Grade I diastolic dysfunction (impaired relaxation). Right Ventricle: The right ventricular size is normal. No increase in right ventricular wall thickness. Right ventricular systolic function is normal. Tricuspid regurgitation signal is inadequate for assessing PA pressure. Left Atrium: Left atrial size was normal in size. Right Atrium: Right atrial size was normal in size. Pericardium: There is no evidence of pericardial effusion. Mitral Valve: The mitral valve is normal in structure. There is mild thickening of the mitral valve leaflet(s). Trivial mitral valve regurgitation. No evidence of mitral valve stenosis. Tricuspid Valve: The tricuspid valve is normal in structure. Tricuspid valve regurgitation is trivial. Aortic Valve: The aortic valve is tricuspid. There is mild calcification of the aortic valve. There is mild thickening of the aortic valve. Aortic valve regurgitation is not visualized. Mild aortic valve sclerosis is present, with no evidence  of aortic valve stenosis. Pulmonic Valve: The pulmonic valve was normal in structure. Pulmonic valve regurgitation is not visualized. Aorta: The aortic root and ascending aorta are structurally normal, with no evidence of dilitation. Venous: The inferior vena cava is normal in size with less than 50% respiratory variability, suggesting right atrial pressure of 8 mmHg. IAS/Shunts: No atrial level shunt detected by color flow Doppler.  LEFT VENTRICLE PLAX 2D LVIDd:         5.30 cm  Diastology LVIDs:         3.80 cm  LV e' medial:    6.87 cm/s LV PW:         0.90 cm  LV E/e' medial:  10.1 LV IVS:        0.90 cm  LV e' lateral:   13.10 cm/s LVOT diam:     2.10 cm  LV E/e' lateral: 5.3 LV SV:         94 LV SV Index:   49 LVOT Area:     3.46 cm  RIGHT VENTRICLE RV S prime:     9.90 cm/s TAPSE (M-mode): 1.9 cm LEFT ATRIUM             Index       RIGHT ATRIUM           Index LA diam:        3.90 cm 2.05 cm/m  RA Area:     13.20 cm LA Vol (A2C):   59.2 ml 31.06 ml/m RA Volume:   33.20 ml  17.42 ml/m LA Vol (A4C):   60.4 ml 31.69 ml/m LA Biplane Vol:  60.7 ml 31.85 ml/m  AORTIC VALVE LVOT Vmax:   120.00 cm/s LVOT Vmean:  82.000 cm/s LVOT VTI:    0.270 m  AORTA Ao Root diam: 3.10 cm Ao Asc diam:  3.00 cm MITRAL VALVE MV Area (PHT): 3.12 cm    SHUNTS MV Decel Time: 243 msec    Systemic VTI:  0.27 m MV E velocity: 69.60 cm/s  Systemic Diam: 2.10 cm MV A velocity: 68.60 cm/s MV E/A ratio:  1.01 Laurance Flatten MD Electronically signed by Laurance Flatten MD Signature Date/Time: 08/03/2020/11:33:35 AM    Final      PERTINENT LAB RESULTS: CBC: Recent Labs    08/05/20 0042 08/06/20 0022  WBC 17.6* 17.2*  HGB 12.5* 12.9*  HCT 36.4* 37.6*  PLT 328 339   CMET CMP     Component Value Date/Time   NA 138 08/06/2020 0022   K 3.7 08/06/2020 0022   CL 106 08/06/2020 0022   CO2 26 08/06/2020 0022   GLUCOSE 160 (H) 08/06/2020 0022   BUN 33 (H) 08/06/2020 0022   CREATININE 0.76 08/06/2020 0022   CREATININE  0.91 11/25/2019 0835   CALCIUM 8.4 (L) 08/06/2020 0022   PROT 6.0 (L) 08/05/2020 0042   ALBUMIN 2.9 (L) 08/05/2020 0042   AST 36 08/05/2020 0042   ALT 46 (H) 08/05/2020 0042   ALKPHOS 61 08/05/2020 0042   BILITOT 0.6 08/05/2020 0042   GFRNONAA >60 08/06/2020 0022   GFRNONAA 91 11/25/2019 0835   GFRAA 105 11/25/2019 0835    GFR Estimated Creatinine Clearance: 89.5 mL/min (by C-G formula based on SCr of 0.76 mg/dL). No results for input(s): LIPASE, AMYLASE in the last 72 hours. No results for input(s): CKTOTAL, CKMB, CKMBINDEX, TROPONINI in the last 72 hours. Invalid input(s): POCBNP Recent Labs    08/04/20 0106 08/05/20 0042  DDIMER 0.66* 0.45   No results for input(s): HGBA1C in the last 72 hours. No results for input(s): CHOL, HDL, LDLCALC, TRIG, CHOLHDL, LDLDIRECT in the last 72 hours. No results for input(s): TSH, T4TOTAL, T3FREE, THYROIDAB in the last 72 hours.  Invalid input(s): FREET3 No results for input(s): VITAMINB12, FOLATE, FERRITIN, TIBC, IRON, RETICCTPCT in the last 72 hours. Coags: No results for input(s): INR in the last 72 hours.  Invalid input(s): PT Microbiology: Recent Results (from the past 240 hour(s))  Resp Panel by RT-PCR (Flu A&B, Covid) Nasopharyngeal Swab     Status: Abnormal   Collection Time: 08/02/20 12:40 PM   Specimen: Nasopharyngeal Swab; Nasopharyngeal(NP) swabs in vial transport medium  Result Value Ref Range Status   SARS Coronavirus 2 by RT PCR POSITIVE (A) NEGATIVE Final    Comment: CRITICAL RESULT CALLED TO, READ BACK BY AND VERIFIED WITH: RN ALBERTA CRTALES AT 1623 ON 08/02/20 BYH KJ (NOTE) SARS-CoV-2 target nucleic acids are DETECTED.  The SARS-CoV-2 RNA is generally detectable in upper respiratory specimens during the acute phase of infection. Positive results are indicative of the presence of the identified virus, but do not rule out bacterial infection or co-infection with other pathogens not detected by the test. Clinical  correlation with patient history and other diagnostic information is necessary to determine patient infection status. The expected result is Negative.  Fact Sheet for Patients: BloggerCourse.com  Fact Sheet for Healthcare Providers: SeriousBroker.it  This test is not yet approved or cleared by the [place] FDA and  has been authorized for detection and/or diagnosis of SARS-CoV-2 by FDA under an Emergency Use Authorization (EUA).  This EUA will remain in  effect (mean ing this test can be used) for the duration of  the COVID-19 declaration under Section 564(b)(1) of the Act, 21 U.S.C. section 360bbb-3(b)(1), unless the authorization is terminated or revoked sooner.     Influenza A by PCR NEGATIVE NEGATIVE Final   Influenza B by PCR NEGATIVE NEGATIVE Final    Comment: (NOTE) The Xpert Xpress SARS-CoV-2/FLU/RSV plus assay is intended as an aid in the diagnosis of influenza from Nasopharyngeal swab specimens and should not be used as a sole basis for treatment. Nasal washings and aspirates are unacceptable for Xpert Xpress SARS-CoV-2/FLU/RSV testing.  Fact Sheet for Patients: BloggerCourse.com  Fact Sheet for Healthcare Providers: SeriousBroker.it  This test is not yet approved or cleared by the [place] FDA and has been authorized for detection and/or diagnosis of SARS-CoV-2 by FDA under an Emergency Use Authorization (EUA). This EUA will remain in effect (meaning this test can be used) for the duration of the COVID-19 declaration under Section 564(b)(1) of the Act, 21 U.S.C. section 360bbb-3(b)(1), unless the authorization is terminated or revoked.  Performed at [facility], [address], [place], [place] [address]   Blood Culture (routine x 2)     Status: None (Preliminary result)   Collection Time: 08/02/20 12:49 PM   Specimen: BLOOD  Result Value Ref  Range Status   Specimen Description BLOOD SITE NOT SPECIFIED  Final   Special Requests   Final    BOTTLES DRAWN AEROBIC AND ANAEROBIC Blood Culture adequate volume   Culture   Final    NO GROWTH 3 DAYS Performed at [facility], [address], [place], [place] [address]    Report Status PENDING  Incomplete  Blood Culture (routine x 2)     Status: None (Preliminary result)   Collection Time: 08/02/20  1:08 PM   Specimen: BLOOD  Result Value Ref Range Status   Specimen Description BLOOD LEFT ANTECUBITAL  Final   Special Requests   Final    BOTTLES DRAWN AEROBIC AND ANAEROBIC Blood Culture results may not be optimal due to an excessive volume of blood received in culture bottles   Culture   Final    NO GROWTH 3 DAYS Performed at [facility], [address] [place], [place] [address]    Report Status PENDING  Incomplete    FURTHER DISCHARGE INSTRUCTIONS:  Get Medicines reviewed and adjusted: Please take all your medications with you for your next visit with your Primary MD  Laboratory/radiological data: Please request your Primary MD to go over all hospital tests and procedure/radiological results at the follow up, please ask your Primary MD to get all Hospital records sent to his/her office.  In some cases, they will be blood work, cultures and biopsy results pending at the time of your discharge. Please request that your primary care M.D. goes through all the records of your hospital data and follows up on these results.  Also Note the following: If you experience worsening of your admission symptoms, develop shortness of breath, life threatening emergency, suicidal or homicidal thoughts you must seek medical attention immediately by calling 911 or calling your MD immediately  if symptoms less severe.  You must read complete instructions/literature along with all the possible adverse reactions/side effects for all the Medicines you take and that have been prescribed  to you. Take any new Medicines after you have completely understood and accpet all the possible adverse reactions/side effects.   Do not drive when taking Pain  medications or sleeping medications (Benzodaizepines)  Do not take more than prescribed Pain, Sleep and Anxiety Medications. It is not advisable to combine anxiety,sleep and pain medications without talking with your primary care practitioner  Special Instructions: If you have smoked or chewed Tobacco  in the last 2 yrs please stop smoking, stop any regular Alcohol  and or any Recreational drug use.  Wear Seat belts while driving.  Please note: You were cared for by a hospitalist during your hospital stay. Once you are discharged, your primary care physician will handle any further medical issues. Please note that NO REFILLS for any discharge medications will be authorized once you are discharged, as it is imperative that you return to your primary care physician (or establish a relationship with a primary care physician if you do not have one) for your post hospital discharge needs so that they can reassess your need for medications and monitor your lab values.  Total Time spent coordinating discharge including counseling, education and face to face time equals 35 minutes.  Signed: Shaquan Puerta 08/06/2020 10:14 AM

## 2020-08-06 NOTE — Progress Notes (Signed)
Patient discharged from facility at this time. All discharge instructions reviewed with patient. All follow up appointments reviewed, patient verbalized understanding. No further questions or needs at this time. Both IV sites removed, catheters intact, pressure dressings applied. All patient belongings in disposition of the patient.

## 2020-08-06 NOTE — Plan of Care (Signed)
  Problem: Activity: Goal: Risk for activity intolerance will decrease 08/06/2020 1130 by Clyde Canterbury, RN Outcome: Adequate for Discharge 08/06/2020 1129 by Clyde Canterbury, RN Outcome: Adequate for Discharge   Problem: Nutrition: Goal: Adequate nutrition will be maintained 08/06/2020 1130 by Clyde Canterbury, RN Outcome: Adequate for Discharge 08/06/2020 1129 by Clyde Canterbury, RN Outcome: Adequate for Discharge   Problem: Coping: Goal: Level of anxiety will decrease 08/06/2020 1130 by Clyde Canterbury, RN Outcome: Adequate for Discharge 08/06/2020 1129 by Clyde Canterbury, RN Outcome: Adequate for Discharge   Problem: Elimination: Goal: Will not experience complications related to bowel motility 08/06/2020 1130 by Clyde Canterbury, RN Outcome: Adequate for Discharge 08/06/2020 1129 by Clyde Canterbury, RN Outcome: Adequate for Discharge   Problem: Elimination: Goal: Will not experience complications related to urinary retention 08/06/2020 1130 by Clyde Canterbury, RN Outcome: Adequate for Discharge 08/06/2020 1129 by Clyde Canterbury, RN Outcome: Adequate for Discharge   Problem: Pain Managment: Goal: General experience of comfort will improve 08/06/2020 1130 by Clyde Canterbury, RN Outcome: Adequate for Discharge 08/06/2020 1129 by Clyde Canterbury, RN Outcome: Adequate for Discharge   Problem: Safety: Goal: Ability to remain free from injury will improve 08/06/2020 1130 by Clyde Canterbury, RN Outcome: Adequate for Discharge 08/06/2020 1129 by Clyde Canterbury, RN Outcome: Adequate for Discharge   Problem: Skin Integrity: Goal: Risk for impaired skin integrity will decrease 08/06/2020 1130 by Clyde Canterbury, RN Outcome: Adequate for Discharge 08/06/2020 1129 by Clyde Canterbury, RN Outcome: Adequate for Discharge

## 2020-08-06 NOTE — Plan of Care (Signed)

## 2020-08-07 LAB — CULTURE, BLOOD (ROUTINE X 2)
Culture: NO GROWTH
Culture: NO GROWTH
Special Requests: ADEQUATE

## 2020-08-14 ENCOUNTER — Other Ambulatory Visit: Payer: Self-pay | Admitting: Internal Medicine

## 2020-08-14 DIAGNOSIS — F418 Other specified anxiety disorders: Secondary | ICD-10-CM

## 2020-08-15 ENCOUNTER — Ambulatory Visit (INDEPENDENT_AMBULATORY_CARE_PROVIDER_SITE_OTHER): Payer: 59 | Admitting: Internal Medicine

## 2020-08-15 ENCOUNTER — Other Ambulatory Visit: Payer: Self-pay

## 2020-08-15 ENCOUNTER — Encounter: Payer: Self-pay | Admitting: Internal Medicine

## 2020-08-15 VITALS — BP 138/78 | HR 86 | Temp 97.9°F | Resp 16 | Ht 67.0 in | Wt 169.0 lb

## 2020-08-15 DIAGNOSIS — I5189 Other ill-defined heart diseases: Secondary | ICD-10-CM

## 2020-08-15 DIAGNOSIS — E781 Pure hyperglyceridemia: Secondary | ICD-10-CM

## 2020-08-15 DIAGNOSIS — I10 Essential (primary) hypertension: Secondary | ICD-10-CM

## 2020-08-15 DIAGNOSIS — I70209 Unspecified atherosclerosis of native arteries of extremities, unspecified extremity: Secondary | ICD-10-CM | POA: Diagnosis not present

## 2020-08-15 DIAGNOSIS — E1151 Type 2 diabetes mellitus with diabetic peripheral angiopathy without gangrene: Secondary | ICD-10-CM

## 2020-08-15 DIAGNOSIS — R7989 Other specified abnormal findings of blood chemistry: Secondary | ICD-10-CM | POA: Diagnosis not present

## 2020-08-15 DIAGNOSIS — I2510 Atherosclerotic heart disease of native coronary artery without angina pectoris: Secondary | ICD-10-CM

## 2020-08-15 LAB — CBC WITH DIFFERENTIAL/PLATELET
Basophils Absolute: 0.1 10*3/uL (ref 0.0–0.1)
Basophils Relative: 0.6 % (ref 0.0–3.0)
Eosinophils Absolute: 0.1 10*3/uL (ref 0.0–0.7)
Eosinophils Relative: 0.9 % (ref 0.0–5.0)
HCT: 39.3 % (ref 39.0–52.0)
Hemoglobin: 13.2 g/dL (ref 13.0–17.0)
Lymphocytes Relative: 14.6 % (ref 12.0–46.0)
Lymphs Abs: 1.5 10*3/uL (ref 0.7–4.0)
MCHC: 33.5 g/dL (ref 30.0–36.0)
MCV: 86.8 fl (ref 78.0–100.0)
Monocytes Absolute: 0.9 10*3/uL (ref 0.1–1.0)
Monocytes Relative: 8.4 % (ref 3.0–12.0)
Neutro Abs: 7.7 10*3/uL (ref 1.4–7.7)
Neutrophils Relative %: 75.5 % (ref 43.0–77.0)
Platelets: 239 10*3/uL (ref 150.0–400.0)
RBC: 4.53 Mil/uL (ref 4.22–5.81)
RDW: 13.6 % (ref 11.5–15.5)
WBC: 10.3 10*3/uL (ref 4.0–10.5)

## 2020-08-15 LAB — POCT GLUCOSE (DEVICE FOR HOME USE): Glucose Fasting, POC: 215 mg/dL — AB (ref 70–99)

## 2020-08-15 LAB — PROTIME-INR
INR: 0.9 ratio (ref 0.8–1.0)
Prothrombin Time: 10.4 s (ref 9.6–13.1)

## 2020-08-15 LAB — HEMOGLOBIN A1C: Hgb A1c MFr Bld: 9.3 % — ABNORMAL HIGH (ref 4.6–6.5)

## 2020-08-15 MED ORDER — TOUJEO MAX SOLOSTAR 300 UNIT/ML ~~LOC~~ SOPN
30.0000 [IU] | PEN_INJECTOR | Freq: Every day | SUBCUTANEOUS | 0 refills | Status: DC
Start: 2020-08-15 — End: 2021-04-18

## 2020-08-15 MED ORDER — GVOKE HYPOPEN 2-PACK 1 MG/0.2ML ~~LOC~~ SOAJ: 1.0000 | Freq: Every day | SUBCUTANEOUS | 5 refills | Status: DC | PRN

## 2020-08-15 MED ORDER — FREESTYLE LIBRE 2 READER DEVI: 1.0000 | Freq: Every day | 5 refills | Status: DC

## 2020-08-15 MED ORDER — FREESTYLE LIBRE 2 SENSOR MISC: 1.0000 | Freq: Every day | 5 refills | Status: DC

## 2020-08-15 MED ORDER — INSULIN PEN NEEDLE 32G X 6 MM MISC
1.0000 | Freq: Every day | 1 refills | Status: DC
Start: 2020-08-15 — End: 2022-04-26

## 2020-08-15 NOTE — Patient Instructions (Signed)
Type 2 Diabetes Mellitus, Diagnosis, Adult Type 2 diabetes (type 2 diabetes mellitus) is a long-term, or chronic, disease. In type 2 diabetes, one or both of these problems may be present:  The pancreas does not make enough of a hormone called insulin.  Cells in the body do not respond properly to insulin that the body makes (insulin resistance). Normally, insulin allows blood sugar (glucose) to enter cells in the body. The cells use glucose for energy. Insulin resistance or lack of insulin causes excess glucose to build up in the blood instead of going into cells. This causes high blood glucose (hyperglycemia).  What are the causes? The exact cause of type 2 diabetes is not known. What increases the risk? The following factors may make you more likely to develop this condition:  Having a family member with type 2 diabetes.  Being overweight or obese.  Being inactive (sedentary).  Having been diagnosed with insulin resistance.  Having a history of prediabetes, diabetes when you were pregnant (gestational diabetes), or polycystic ovary syndrome (PCOS). What are the signs or symptoms? In the early stage of this condition, you may not have symptoms. Symptoms develop slowly and may include:  Increased thirst or hunger.  Increased urination.  Unexplained weight loss.  Tiredness (fatigue) or weakness.  Vision changes, such as blurry vision.  Dark patches on the skin. How is this diagnosed? This condition is diagnosed based on your symptoms, your medical history, a physical exam, and your blood glucose level. Your blood glucose may be checked with one or more of the following blood tests:  A fasting blood glucose (FBG) test. You will not be allowed to eat (you will fast) for 8 hours or longer before a blood sample is taken.  A random blood glucose test. This test checks blood glucose at any time of day regardless of when you ate.  An A1C (hemoglobin A1C) blood test. This test  provides information about blood glucose levels over the previous 2-3 months.  An oral glucose tolerance test (OGTT). This test measures your blood glucose at two times: ? After fasting. This is your baseline blood glucose level. ? Two hours after drinking a beverage that contains glucose. You may be diagnosed with type 2 diabetes if:  Your fasting blood glucose level is 126 mg/dL (7.0 mmol/L) or higher.  Your random blood glucose level is 200 mg/dL (11.1 mmol/L) or higher.  Your A1C level is 6.5% or higher.  Your oral glucose tolerance test result is higher than 200 mg/dL (11.1 mmol/L). These blood tests may be repeated to confirm your diagnosis.   How is this treated? Your treatment may be managed by a specialist called an endocrinologist. Type 2 diabetes may be treated by following instructions from your health care provider about:  Making dietary and lifestyle changes. These may include: ? Following a personalized nutrition plan that is developed by a registered dietitian. ? Exercising regularly. ? Finding ways to manage stress.  Checking your blood glucose level as often as told.  Taking diabetes medicines or insulin daily. This helps to keep your blood glucose levels in the healthy range.  Taking medicines to help prevent complications from diabetes. Medicines may include: ? Aspirin. ? Medicine to lower cholesterol. ? Medicine to control blood pressure. Your health care provider will set treatment goals for you. Your goals will be based on your age, other medical conditions you have, and how you respond to diabetes treatment. Generally, the goal of treatment is to maintain the   following blood glucose levels:  Before meals: 80-130 mg/dL (4.4-7.2 mmol/L).  After meals: below 180 mg/dL (10 mmol/L).  A1C level: less than 7%. Follow these instructions at home: Questions to ask your health care provider Consider asking the following questions:  Should I meet with a certified  diabetes care and education specialist?  What diabetes medicines do I need, and when should I take them?  What equipment will I need to manage my diabetes at home?  How often do I need to check my blood glucose?  Where can I find a support group for people with diabetes?  What number can I call if I have questions?  When is my next appointment? General instructions  Take over-the-counter and prescription medicines only as told by your health care provider.  Keep all follow-up visits as told by your health care provider. This is important. Where to find more information  American Diabetes Association (ADA): www.diabetes.org  American Association of Diabetes Care and Education Specialists (ADCES): www.diabeteseducator.org  International Diabetes Federation (IDF): www.idf.org Contact a health care provider if:  Your blood glucose is at or above 240 mg/dL (13.3 mmol/L) for 2 days in a row.  You have been sick or have had a fever for 2 days or longer, and you are not getting better.  You have any of the following problems for more than 6 hours: ? You cannot eat or drink. ? You have nausea and vomiting. ? You have diarrhea. Get help right away if:  You have severe hypoglycemia. This means your blood glucose is lower than 54 mg/dL (3.0 mmol/L).  You become confused or you have trouble thinking clearly.  You have difficulty breathing.  You have moderate or large ketone levels in your urine. These symptoms may represent a serious problem that is an emergency. Do not wait to see if the symptoms will go away. Get medical help right away. Call your local emergency services (911 in the [place] Do not drive yourself to the hospital. Summary  Type 2 diabetes (type 2 diabetes mellitus) is a long-term, or chronic, disease. In type 2 diabetes, the pancreas does not make enough of a hormone called insulin, or cells in the body do not respond properly to insulin that the body makes (insulin  resistance).  This condition is treated by making dietary and lifestyle changes and taking diabetes medicines or insulin.  Your health care provider will set treatment goals for you. Your goals will be based on your age, other medical conditions you have, and how you respond to diabetes treatment.  Keep all follow-up visits as told by your health care provider. This is important. This information is not intended to replace advice given to you by your health care provider. Make sure you discuss any questions you have with your health care provider. Document Revised: 11/04/2019 Document Reviewed: 11/04/2019 Elsevier Patient Education  2021 [employer]  

## 2020-08-15 NOTE — Progress Notes (Signed)
Subjective:  Patient ID: Justin Galvan, male    DOB: 1960-11-10  Age: 60 y.o. MRN: [id number]  CC: Diabetes, Congestive Heart Failure, and Hyperlipidemia  This visit occurred during the SARS-CoV-2 public health emergency.  Safety protocols were in place, including screening questions prior to the visit, additional usage of staff PPE, and extensive cleaning of exam room while observing appropriate contact time as indicated for disinfecting solutions.    HPI Justin Galvan presents for f/up -   He was recently admitted for pneumonia and respiratory failure due to COVID-19 infection.  He tells me the cough has resolved and he denies fever, chills, chest pain, shortness of breath, wheezing, or night sweats.  He has completed a course of prednisone.  He complains that his blood sugar has not been well controlled.  In the last week he does not think he has had a blood sugar less than 180 and some of them has been as high as 350.  He complains of polyuria but denies polydipsia, polyphagia, or changes in his weight.  Outpatient Medications Prior to Visit  Medication Sig Dispense Refill  . albuterol (VENTOLIN HFA) 108 (90 Base) MCG/ACT inhaler Inhale 2 puffs into the lungs every 6 (six) hours as needed for wheezing or shortness of breath. 6.7 g 0  . aspirin 81 MG EC tablet Take 1 tablet (81 mg total) by mouth daily. 90 tablet 1  . atorvastatin (LIPITOR) 80 MG tablet TAKE 1 TABLET (80 MG TOTAL) BY MOUTH DAILY. AT 6PM 90 tablet 1  . Cholecalciferol 50 MCG (2000 UT) TABS Take 1 tablet (2,000 Units total) by mouth daily. Take with food or whole milk 90 tablet 3  . dapagliflozin propanediol (FARXIGA) 10 MG TABS tablet Take 1 tablet (10 mg total) by mouth daily. 90 tablet 1  . glucose blood test strip Test up to two times daily 100 each 12  . indapamide (LOZOL) 1.25 MG tablet Take 1 tablet (1.25 mg total) by mouth daily. 90 tablet 1  . irbesartan (AVAPRO) 300 MG tablet Take 1 tablet (300 mg total) by  mouth daily. 90 tablet 1  . metFORMIN (GLUCOPHAGE-XR) 750 MG 24 hr tablet Take 2 tablets (1,500 mg total) by mouth daily with breakfast. 180 tablet 1  . nebivolol (BYSTOLIC) 5 MG tablet Take 1 tablet (5 mg total) by mouth daily. 90 tablet 0  . PARoxetine (PAXIL) 40 MG tablet TAKE 1 TABLET BY MOUTH EVERY DAY 30 tablet 5  . tadalafil (CIALIS) 20 MG tablet Take 1 tablet (20 mg total) by mouth daily as needed for erectile dysfunction. 8 tablet 0  . terazosin (HYTRIN) 2 MG capsule Take 1 capsule (2 mg total) by mouth at bedtime. 90 capsule 1  . benzonatate (TESSALON PERLES) 100 MG capsule Take 1 capsule (100 mg total) by mouth 3 (three) times daily as needed for cough. 30 capsule 0  . icosapent Ethyl (VASCEPA) 1 g capsule Take 2 capsules (2 g total) by mouth 2 (two) times daily. 360 capsule 1  . predniSONE (DELTASONE) 10 MG tablet Take 30 mg daily for 1 day, 20 mg daily for 1 days,10 mg daily for 1 day, then stop 6 tablet 0  . nitroGLYCERIN (NITROSTAT) 0.4 MG SL tablet Place 1 tablet (0.4 mg total) under the tongue every 5 (five) minutes as needed for chest pain. 25 tablet 6   No facility-administered medications prior to visit.    ROS Review of Systems  Constitutional: Negative for appetite change, chills, diaphoresis,  fatigue and fever.  HENT: Negative.   Eyes: Negative for visual disturbance.  Respiratory: Negative for cough, chest tightness, shortness of breath and wheezing.   Cardiovascular: Negative for chest pain, palpitations and leg swelling.  Gastrointestinal: Negative for abdominal pain, constipation, diarrhea, nausea and vomiting.  Endocrine: Positive for polyuria. Negative for polydipsia and polyphagia.  Genitourinary: Negative.  Negative for difficulty urinating.  Musculoskeletal: Negative.  Negative for arthralgias and myalgias.  Skin: Negative.   Neurological: Negative.  Negative for dizziness, weakness and light-headedness.  Hematological: Negative for adenopathy. Does not  bruise/bleed easily.  Psychiatric/Behavioral: Negative.     Objective:  BP 138/78 (BP Location: Left Arm, Patient Position: Sitting, Cuff Size: Large)   Pulse 86   Temp 97.9 F (36.6 C) (Oral)   Resp 16   Ht 5\' 7"  (1.702 m)   Wt 169 lb (76.7 kg)   SpO2 94%   BMI 26.47 kg/m   BP Readings from Last 3 Encounters:  08/15/20 138/78  08/06/20 137/84  07/07/20 (!) 166/86    Wt Readings from Last 3 Encounters:  08/15/20 169 lb (76.7 kg)  08/06/20 169 lb 12.1 oz (77 kg)  07/07/20 174 lb (78.9 kg)    Physical Exam Vitals reviewed.  Constitutional:      Appearance: Normal appearance.  HENT:     Nose: Nose normal.     Mouth/Throat:     Mouth: Mucous membranes are moist.  Eyes:     General: No scleral icterus.    Conjunctiva/sclera: Conjunctivae normal.  Cardiovascular:     Rate and Rhythm: Normal rate and regular rhythm.     Heart sounds: No murmur heard.   Pulmonary:     Effort: Pulmonary effort is normal.     Breath sounds: No stridor. No wheezing, rhonchi or rales.  Abdominal:     General: Abdomen is flat.     Palpations: There is no mass.     Tenderness: There is no abdominal tenderness. There is no guarding.     Hernia: No hernia is present.  Musculoskeletal:        General: Normal range of motion.     Cervical back: Neck supple.     Right lower leg: No edema.     Left lower leg: No edema.  Lymphadenopathy:     Cervical: No cervical adenopathy.  Skin:    General: Skin is warm and dry.     Coloration: Skin is not pale.  Neurological:     General: No focal deficit present.     Mental Status: He is alert.  Psychiatric:        Mood and Affect: Mood normal.        Behavior: Behavior normal.     Lab Results  Component Value Date   WBC 10.3 08/15/2020   HGB 13.2 08/15/2020   HCT 39.3 08/15/2020   PLT 239.0 08/15/2020   GLUCOSE 213 (H) 08/15/2020   CHOL 158 07/07/2020   TRIG 475.0 (H) 08/15/2020   HDL 39.30 07/07/2020   LDLDIRECT 92.0 07/07/2020    LDLCALC 67 01/27/2019   ALT 30 08/15/2020   AST 20 08/15/2020   NA 140 08/15/2020   K 3.9 08/15/2020   CL 100 08/15/2020   CREATININE 1.03 08/15/2020   BUN 23 08/15/2020   CO2 28 08/15/2020   TSH 1.71 07/07/2020   PSA 2.06 07/07/2020   INR 0.9 08/15/2020   HGBA1C 9.3 (H) 08/15/2020   MICROALBUR 1.1 07/07/2020    DG Chest [place]  1 View  Result Date: 08/03/2020 CLINICAL DATA:  Shortness of breath EXAM: PORTABLE CHEST 1 VIEW COMPARISON:  August 02, 2020 FINDINGS: Ill-defined airspace opacity persists in the right upper lobe. More subtle opacity is noted in each lung bases. No consolidation. Heart size and pulmonary vascularity are normal. No adenopathy. There is aortic atherosclerosis. No bone lesions. IMPRESSION: Ill-defined areas of airspace opacity, most notable in the right upper lobe. Question atypical organism pneumonia. No consolidation. Stable cardiac silhouette. Aortic Atherosclerosis (ICD10-I70.0). Electronically Signed   By: Bretta Bang III M.D.   On: 08/03/2020 08:10   DG Chest [place] 1 View  Result Date: 08/02/2020 CLINICAL DATA:  Dyspnea. EXAM: PORTABLE CHEST 1 VIEW COMPARISON:  April 22, 2015. FINDINGS: The heart size and mediastinal contours are within normal limits. No pneumothorax or pleural effusion is noted. Multiple patchy airspace opacities are noted throughout both lungs concerning for multifocal pneumonia. The visualized skeletal structures are unremarkable. IMPRESSION: Multiple patchy airspace opacities are noted bilaterally concerning for multifocal pneumonia. Electronically Signed   By: Lupita Raider M.D.   On: 08/02/2020 14:41   ECHOCARDIOGRAM COMPLETE  Result Date: 08/03/2020    ECHOCARDIOGRAM REPORT   Patient Name:   KEIRON IODICE Date of Exam: 08/03/2020 Medical Rec #:  [id number]        Height:       67.0 in Accession #:    [id number]       Weight:       174.0 lb Date of Birth:  Jan 02, 1961         BSA:          1.906 m Patient Age:    60 years         BP:            116/54 mmHg Patient Gender: M                HR:           54 bpm. Exam Location:  Inpatient Procedure: 2D Echo, Cardiac Doppler and Color Doppler Indications:    [place] respiratory distress R06.03  History:        Patient has prior history of Echocardiogram examinations, most                 recent 04/23/2015. Previous Myocardial Infarction and CAD; Risk                 Factors:Hypertension, Diabetes and Dyslipidemia.  Sonographer:    Eulah Pont RDCS Referring Phys: Sharla Kidney Werner [facility] IMPRESSIONS  1. Left ventricular ejection fraction, by estimation, is 50 to 55%. The left ventricle has low normal function. The left ventricle demonstrates regional wall motion abnormalities (see scoring diagram/findings for description). The basal-to-mid inferior wall is hypokinetic. Left ventricular diastolic parameters are consistent with Grade I diastolic dysfunction (impaired relaxation).  2. Right ventricular systolic function is normal. The right ventricular size is normal. Tricuspid regurgitation signal is inadequate for assessing PA pressure.  3. The mitral valve is normal in structure. Trivial mitral valve regurgitation. No evidence of mitral stenosis.  4. The aortic valve is tricuspid. There is mild calcification of the aortic valve. There is mild thickening of the aortic valve. Aortic valve regurgitation is not visualized. Mild aortic valve sclerosis is present, with no evidence of aortic valve stenosis.  5. The inferior vena cava is normal in size with <50% respiratory variability, suggesting right atrial pressure of 8 mmHg. Comparison(s): Compared to prior TTE in 2016, the  LVEF is now slightly improved to 50-55%. There continues to be basal-to-mid inferior wall hypokinesis. FINDINGS  Left Ventricle: Left ventricular ejection fraction, by estimation, is 50 to 55%. The left ventricle has low normal function. The left ventricle demonstrates regional wall motion abnormalities. The basal-to-mid inferior wall  is hypokinetic. The left ventricular internal cavity size was normal in size. There is no left ventricular hypertrophy. Left ventricular diastolic parameters are consistent with Grade I diastolic dysfunction (impaired relaxation). Right Ventricle: The right ventricular size is normal. No increase in right ventricular wall thickness. Right ventricular systolic function is normal. Tricuspid regurgitation signal is inadequate for assessing PA pressure. Left Atrium: Left atrial size was normal in size. Right Atrium: Right atrial size was normal in size. Pericardium: There is no evidence of pericardial effusion. Mitral Valve: The mitral valve is normal in structure. There is mild thickening of the mitral valve leaflet(s). Trivial mitral valve regurgitation. No evidence of mitral valve stenosis. Tricuspid Valve: The tricuspid valve is normal in structure. Tricuspid valve regurgitation is trivial. Aortic Valve: The aortic valve is tricuspid. There is mild calcification of the aortic valve. There is mild thickening of the aortic valve. Aortic valve regurgitation is not visualized. Mild aortic valve sclerosis is present, with no evidence of aortic valve stenosis. Pulmonic Valve: The pulmonic valve was normal in structure. Pulmonic valve regurgitation is not visualized. Aorta: The aortic root and ascending aorta are structurally normal, with no evidence of dilitation. Venous: The inferior vena cava is normal in size with less than 50% respiratory variability, suggesting right atrial pressure of 8 mmHg. IAS/Shunts: No atrial level shunt detected by color flow Doppler.  LEFT VENTRICLE PLAX 2D LVIDd:         5.30 cm  Diastology LVIDs:         3.80 cm  LV e' medial:    6.87 cm/s LV PW:         0.90 cm  LV E/e' medial:  10.1 LV IVS:        0.90 cm  LV e' lateral:   13.10 cm/s LVOT diam:     2.10 cm  LV E/e' lateral: 5.3 LV SV:         94 LV SV Index:   49 LVOT Area:     3.46 cm  RIGHT VENTRICLE RV S prime:     9.90 cm/s TAPSE  (M-mode): 1.9 cm LEFT ATRIUM             Index       RIGHT ATRIUM           Index LA diam:        3.90 cm 2.05 cm/m  RA Area:     13.20 cm LA Vol (A2C):   59.2 ml 31.06 ml/m RA Volume:   33.20 ml  17.42 ml/m LA Vol (A4C):   60.4 ml 31.69 ml/m LA Biplane Vol: 60.7 ml 31.85 ml/m  AORTIC VALVE LVOT Vmax:   120.00 cm/s LVOT Vmean:  82.000 cm/s LVOT VTI:    0.270 m  AORTA Ao Root diam: 3.10 cm Ao Asc diam:  3.00 cm MITRAL VALVE MV Area (PHT): 3.12 cm    SHUNTS MV Decel Time: 243 msec    Systemic VTI:  0.27 m MV E velocity: 69.60 cm/s  Systemic Diam: 2.10 cm MV A velocity: 68.60 cm/s MV E/A ratio:  1.01 Laurance FlattenHeather Pemberton MD Electronically signed by Laurance FlattenHeather Pemberton MD Signature Date/Time: 08/03/2020/11:33:35 AM    Final  Assessment & Plan:   Nyeem was seen today for diabetes, congestive heart failure and hyperlipidemia.  Diagnoses and all orders for this visit:  Hypertriglyceridemia -     Triglycerides; Future -     Triglycerides -     icosapent Ethyl (VASCEPA) 1 g capsule; Take 2 capsules (2 g total) by mouth 2 (two) times daily.  Essential hypertension- His BP is adequately well controlled. 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 -     Basic metabolic panel; Future -     POCT Glucose (Device for Home Use) -     Hemoglobin A1c; Future -     insulin glargine, 2 Unit Dial, (TOUJEO MAX SOLOSTAR) 300 UNIT/ML Solostar Pen; Inject 30 Units into the skin daily. -     Amb Referral to Nutrition and Diabetic Education -     Insulin Pen Needle 32G X 6 MM MISC; 1 Act by Does not apply route daily. -     Glucagon (GVOKE HYPOPEN 2-PACK) 1 MG/0.2ML SOAJ; Inject 1 Act into the skin daily as needed. -      Continuous Blood Gluc Sensor (FREESTYLE LIBRE 2 SENSOR) MISC; 1 Act by Does not apply route daily. -     Continuous Blood Gluc Receiver (FREESTYLE LIBRE 2 READER) DEVI; 1 Act by Does not apply route daily. -     Hemoglobin A1c -     Basic metabolic panel  Elevated LFTs- His LFT's are normal now. Will screen for viral hepatitis. -     Hepatic function panel; Future -     Protime-INR; Future -     Hepatitis A antibody, total; Future -     Hepatitis B surface antibody,qualitative; Future -     Hepatitis C antibody; Future -     Hepatitis B surface antibody,quantitative; Future -     Hepatitis B surface antigen; Future -     Hepatitis B surface antigen -     Hepatitis B surface antibody,quantitative -     Hepatitis C antibody -     Hepatitis B surface antibody,qualitative -     Hepatitis A antibody, total -     Protime-INR -     Hepatic function panel  Atherosclerosis of native coronary artery of native heart without angina pectoris -     icosapent Ethyl (VASCEPA) 1 g capsule; Take 2 capsules (2 g total) by mouth 2 (two) times daily.  Grade I diastolic dysfunction- He has a normal volume status.   I have discontinued Tico Crotteau. Hendel's predniSONE and benzonatate. I am also having him start on [employer], Insulin Pen Needle, G[employer]oke HypoPen 2-Pack, FreeStyle Libre 2 Sensor, and [employer] 2 Reader. Additionally, I am having him maintain his glucose blood, tadalafil, nitroGLYCERIN, irbesartan, indapamide, nebivolol, terazosin, dapagliflozin propanediol, aspirin, metFORMIN, Cholecalciferol, atorvastatin, albuterol, PARoxetine, and icosapent Ethyl.  Meds ordered this encounter  Medications  . insulin glargine, 2 Unit Dial, (TOUJEO MAX SOLOSTAR) 300 UNIT/ML Solostar Pen    Sig: Inject 30 Units into the skin daily.    Dispense:  3 mL    Refill:  0  . Insulin Pen Needle 32G X  6 MM MISC    Sig: 1 Act by Does not apply route daily.    Dispense:  100 each    Refill:  1  .  Glucagon (GVOKE HYPOPEN 2-PACK) 1 MG/0.2ML SOAJ    Sig: Inject 1 Act into the skin daily as needed.    Dispense:  2 mL    Refill:  5  . Continuous Blood Gluc Sensor (FREESTYLE LIBRE 2 SENSOR) MISC    Sig: 1 Act by Does not apply route daily.    Dispense:  2 each    Refill:  5  . Continuous Blood Gluc Receiver (FREESTYLE LIBRE 2 READER) DEVI    Sig: 1 Act by Does not apply route daily.    Dispense:  2 each    Refill:  5  . icosapent Ethyl (VASCEPA) 1 g capsule    Sig: Take 2 capsules (2 g total) by mouth 2 (two) times daily.    Dispense:  360 capsule    Refill:  1     Follow-up: Return in about 3 months (around 11/14/2020).  Sanda Linger, MD

## 2020-08-16 DIAGNOSIS — I5189 Other ill-defined heart diseases: Secondary | ICD-10-CM | POA: Insufficient documentation

## 2020-08-16 LAB — BASIC METABOLIC PANEL
BUN: 23 mg/dL (ref 6–23)
CO2: 28 mEq/L (ref 19–32)
Calcium: 9.6 mg/dL (ref 8.4–10.5)
Chloride: 100 mEq/L (ref 96–112)
Creatinine, Ser: 1.03 mg/dL (ref 0.40–1.50)
GFR: 78.02 mL/min (ref >60.00)
Glucose, Bld: 213 mg/dL — ABNORMAL HIGH (ref 70–99)
Potassium: 3.9 mEq/L (ref 3.5–5.1)
Sodium: 140 mEq/L (ref 135–145)

## 2020-08-16 LAB — HEPATITIS B SURFACE ANTIBODY, QUANTITATIVE: Hep B S AB Quant (Post): <5 m[IU]/mL — ABNORMAL LOW (ref >=10)

## 2020-08-16 LAB — HEPATIC FUNCTION PANEL
ALT: 30 U/L (ref 0–53)
AST: 20 U/L (ref 0–37)
Albumin: 3.8 g/dL (ref 3.5–5.2)
Alkaline Phosphatase: 71 U/L (ref 39–117)
Bilirubin, Direct: 0 mg/dL (ref 0.0–0.3)
Total Bilirubin: 0.3 mg/dL (ref 0.2–1.2)
Total Protein: 6.7 g/dL (ref 6.0–8.3)

## 2020-08-16 LAB — HEPATITIS B SURFACE ANTIGEN: Hepatitis B Surface Ag: NONREACTIVE

## 2020-08-16 LAB — TRIGLYCERIDES: Triglycerides: 475 mg/dL — ABNORMAL HIGH (ref 0.0–149.0)

## 2020-08-16 LAB — HEPATITIS C ANTIBODY
Hepatitis C Ab: NONREACTIVE
SIGNAL TO CUT-OFF: 0 (ref <1.00)

## 2020-08-16 LAB — HEPATITIS A ANTIBODY, TOTAL: Hepatitis A AB,Total: NONREACTIVE

## 2020-08-16 MED ORDER — ICOSAPENT ETHYL 1 G PO CAPS: 2.0000 g | ORAL_CAPSULE | Freq: Two times a day (BID) | ORAL | 1 refills | Status: DC

## 2020-09-06 ENCOUNTER — Other Ambulatory Visit: Payer: Self-pay | Admitting: Internal Medicine

## 2020-09-06 DIAGNOSIS — F418 Other specified anxiety disorders: Secondary | ICD-10-CM

## 2020-10-06 ENCOUNTER — Other Ambulatory Visit: Payer: Self-pay | Admitting: Internal Medicine

## 2020-10-06 DIAGNOSIS — I10 Essential (primary) hypertension: Secondary | ICD-10-CM

## 2020-11-15 ENCOUNTER — Telehealth: Payer: Self-pay

## 2020-11-15 NOTE — Telephone Encounter (Signed)
Pt scheduled for 7/28 @ 9.20am

## 2020-11-15 NOTE — Telephone Encounter (Signed)
pts wife has called and stated Dr. Yetta Barre informed him to make an appointment to see him 4-6 weeks after his pos COVID test. Pt wife has stated pts bp diastolic number is in the 50's and pt is having dizzy spells. Pt wife states this has been happening since the pt became pos for COVID weeks ago.  **Please contact pts wife at [phone].

## 2020-11-17 ENCOUNTER — Encounter: Payer: Self-pay | Admitting: Internal Medicine

## 2020-11-17 ENCOUNTER — Other Ambulatory Visit: Payer: Self-pay

## 2020-11-17 ENCOUNTER — Ambulatory Visit (INDEPENDENT_AMBULATORY_CARE_PROVIDER_SITE_OTHER): Payer: 59 | Admitting: Internal Medicine

## 2020-11-17 VITALS — BP 152/84 | HR 73 | Temp 98.2°F | Resp 16 | Ht 67.0 in | Wt 173.0 lb

## 2020-11-17 DIAGNOSIS — I10 Essential (primary) hypertension: Secondary | ICD-10-CM

## 2020-11-17 DIAGNOSIS — E781 Pure hyperglyceridemia: Secondary | ICD-10-CM | POA: Diagnosis not present

## 2020-11-17 DIAGNOSIS — E1151 Type 2 diabetes mellitus with diabetic peripheral angiopathy without gangrene: Secondary | ICD-10-CM | POA: Diagnosis not present

## 2020-11-17 DIAGNOSIS — I70209 Unspecified atherosclerosis of native arteries of extremities, unspecified extremity: Secondary | ICD-10-CM

## 2020-11-17 LAB — BASIC METABOLIC PANEL
BUN: 20 mg/dL (ref 6–23)
CO2: 27 mEq/L (ref 19–32)
Calcium: 9.6 mg/dL (ref 8.4–10.5)
Chloride: 102 mEq/L (ref 96–112)
Creatinine, Ser: 0.96 mg/dL (ref 0.40–1.50)
GFR: 84.74 mL/min (ref >60.00)
Glucose, Bld: 157 mg/dL — ABNORMAL HIGH (ref 70–99)
Potassium: 4.1 mEq/L (ref 3.5–5.1)
Sodium: 139 mEq/L (ref 135–145)

## 2020-11-17 LAB — HEMOGLOBIN A1C: Hgb A1c MFr Bld: 7.9 % — ABNORMAL HIGH (ref 4.6–6.5)

## 2020-11-17 LAB — TRIGLYCERIDES: Triglycerides: 138 mg/dL (ref 0.0–149.0)

## 2020-11-17 NOTE — Progress Notes (Signed)
Subjective:  Patient ID: Filbert SchilderMichael C Hauge, male    DOB: 11/10/1960  Age: 60 y.o. MRN: [id number]  CC: Hypertension and Diabetes  This visit occurred during the SARS-CoV-2 public health emergency.  Safety protocols were in place, including screening questions prior to the visit, additional usage of staff PPE, and extensive cleaning of exam room while observing appropriate contact time as indicated for disinfecting solutions.    HPI Filbert SchilderMichael C Bastedo presents for f/up -   3 days ago he had an episode of lightheadedness and fatigue.  He tells me his blood pressure was around 118/50.  He has not taking indapamide for at least a month.  He was not aware of getting dehydrated.  He denied diarrhea, nausea, vomiting, abdominal pain, chest pain, shortness of breath, diaphoresis, palpitations, or near syncope.  All of the symptoms have resolved and he feels back to baseline.  He is also decided not to use the basal insulin.  Outpatient Medications Prior to Visit  Medication Sig Dispense Refill   aspirin 81 MG EC tablet Take 1 tablet (81 mg total) by mouth daily. 90 tablet 1   atorvastatin (LIPITOR) 80 MG tablet TAKE 1 TABLET (80 MG TOTAL) BY MOUTH DAILY. AT 6PM 90 tablet 1   Cholecalciferol 50 MCG (2000 UT) TABS Take 1 tablet (2,000 Units total) by mouth daily. Take with food or whole milk 90 tablet 3   Continuous Blood Gluc Receiver (FREESTYLE LIBRE 2 READER) DEVI 1 Act by Does not apply route daily. 2 each 5   Continuous Blood Gluc Sensor (FREESTYLE LIBRE 2 SENSOR) MISC 1 Act by Does not apply route daily. 2 each 5   dapagliflozin propanediol (FARXIGA) 10 MG TABS tablet Take 1 tablet (10 mg total) by mouth daily. 90 tablet 1   Glucagon (GVOKE HYPOPEN 2-PACK) 1 MG/0.2ML SOAJ Inject 1 Act into the skin daily as needed. 2 mL 5   glucose blood test strip Test up to two times daily 100 each 12   icosapent Ethyl (VASCEPA) 1 g capsule Take 2 capsules (2 g total) by mouth 2 (two) times daily. 360 capsule 1    Insulin Pen Needle 32G X 6 MM MISC 1 Act by Does not apply route daily. 100 each 1   irbesartan (AVAPRO) 300 MG tablet Take 1 tablet (300 mg total) by mouth daily. 90 tablet 1   metFORMIN (GLUCOPHAGE-XR) 750 MG 24 hr tablet Take 2 tablets (1,500 mg total) by mouth daily with breakfast. 180 tablet 1   nebivolol (BYSTOLIC) 5 MG tablet TAKE 1 TABLET (5 MG TOTAL) BY MOUTH DAILY. 30 tablet 2   PARoxetine (PAXIL) 40 MG tablet TAKE 1 TABLET BY MOUTH EVERY DAY 90 tablet 1   tadalafil (CIALIS) 20 MG tablet Take 1 tablet (20 mg total) by mouth daily as needed for erectile dysfunction. 8 tablet 0   terazosin (HYTRIN) 2 MG capsule Take 1 capsule (2 mg total) by mouth at bedtime. 90 capsule 1   insulin glargine, 2 Unit Dial, (TOUJEO MAX SOLOSTAR) 300 UNIT/ML Solostar Pen Inject 30 Units into the skin daily. (Patient not taking: Reported on 11/17/2020) 3 mL 0   nitroGLYCERIN (NITROSTAT) 0.4 MG SL tablet Place 1 tablet (0.4 mg total) under the tongue every 5 (five) minutes as needed for chest pain. 25 tablet 6   albuterol (VENTOLIN HFA) 108 (90 Base) MCG/ACT inhaler Inhale 2 puffs into the lungs every 6 (six) hours as needed for wheezing or shortness of breath. (Patient not taking: Reported  on 11/17/2020) 6.7 g 0   indapamide (LOZOL) 1.25 MG tablet Take 1 tablet (1.25 mg total) by mouth daily. (Patient not taking: Reported on 11/17/2020) 90 tablet 1   No facility-administered medications prior to visit.    ROS Review of Systems  Constitutional:  Positive for fatigue. Negative for appetite change, diaphoresis and unexpected weight change.  HENT: Negative.    Respiratory:  Negative for cough, chest tightness, shortness of breath and wheezing.   Cardiovascular:  Negative for chest pain, palpitations and leg swelling.  Gastrointestinal:  Negative for abdominal pain, diarrhea, nausea and vomiting.  Endocrine: Negative.   Genitourinary: Negative.  Negative for difficulty urinating, dysuria, hematuria and urgency.   Musculoskeletal:  Negative for arthralgias and myalgias.  Skin: Negative.  Negative for color change and pallor.  Neurological:  Positive for light-headedness. Negative for dizziness.  Hematological:  Negative for adenopathy. Does not bruise/bleed easily.  Psychiatric/Behavioral: Negative.     Objective:  BP (!) 152/84 (BP Location: Left Arm, Patient Position: Sitting, Cuff Size: Large)   Pulse 73   Temp 98.2 F (36.8 C) (Oral)   Resp 16   Ht  (1.702 m)   Wt 173 lb (78.5 kg)   SpO2 98%   BMI 27.10 kg/m   BP Readings from Last 3 Encounters:  11/17/20 (!) 152/84  08/15/20 138/78  08/06/20 137/84    Wt Readings from Last 3 Encounters:  11/17/20 173 lb (78.5 kg)  08/15/20 169 lb (76.7 kg)  08/06/20 169 lb 12.1 oz (77 kg)    Physical Exam Vitals reviewed.  Constitutional:      Appearance: Normal appearance.  HENT:     Mouth/Throat:     Mouth: Mucous membranes are moist.  Eyes:     General: No scleral icterus.    Conjunctiva/sclera: Conjunctivae normal.  Cardiovascular:     Rate and Rhythm: Normal rate and regular rhythm.     Heart sounds: Normal heart sounds, S1 normal and S2 normal.    No gallop.     Comments: EKG- NSR, 60 bpm Old inf infarct No LVH Unchanged Pulmonary:     Breath sounds: No stridor. No wheezing, rhonchi or rales.  Abdominal:     General: Abdomen is flat.     Palpations: There is no mass.     Tenderness: There is no abdominal tenderness. There is no guarding.     Hernia: No hernia is present.  Musculoskeletal:        General: Normal range of motion.     Cervical back: Neck supple.     Right lower leg: No edema.     Left lower leg: No edema.  Skin:    General: Skin is warm and dry.     Coloration: Skin is not jaundiced or pale.     Findings: No rash.  Neurological:     General: No focal deficit present.     Mental Status: He is alert and oriented to person, place, and time.  Psychiatric:        Mood and Affect: Mood normal.         Behavior: Behavior normal.    Lab Results  Component Value Date   WBC 10.3 08/15/2020   HGB 13.2 08/15/2020   HCT 39.3 08/15/2020   PLT 239.0 08/15/2020   GLUCOSE 157 (H) 11/17/2020   CHOL 158 07/07/2020   TRIG 138.0 11/17/2020   HDL 39.30 07/07/2020   LDLDIRECT 92.0 07/07/2020   LDLCALC 67 01/27/2019   ALT  30 08/15/2020   AST 20 08/15/2020   NA 139 11/17/2020   K 4.1 11/17/2020   CL 102 11/17/2020   CREATININE 0.96 11/17/2020   BUN 20 11/17/2020   CO2 27 11/17/2020   TSH 1.71 07/07/2020   PSA 2.06 07/07/2020   INR 0.9 08/15/2020   HGBA1C 7.9 (H) 11/17/2020   MICROALBUR 1.1 07/07/2020    DG Chest Port 1 View  Result Date: 08/03/2020 CLINICAL DATA:  Shortness of breath EXAM: PORTABLE CHEST 1 VIEW COMPARISON:  August 02, 2020 FINDINGS: Ill-defined airspace opacity persists in the right upper lobe. More subtle opacity is noted in each lung bases. No consolidation. Heart size and pulmonary vascularity are normal. No adenopathy. There is aortic atherosclerosis. No bone lesions. IMPRESSION: Ill-defined areas of airspace opacity, most notable in the right upper lobe. Question atypical organism pneumonia. No consolidation. Stable cardiac silhouette. Aortic Atherosclerosis (ICD10-I70.0). Electronically Signed   By: Bretta Bang III M.D.   On: 08/03/2020 08:10   DG Chest Port 1 View  Result Date: 08/02/2020 CLINICAL DATA:  Dyspnea. EXAM: PORTABLE CHEST 1 VIEW COMPARISON:  April 22, 2015. FINDINGS: The heart size and mediastinal contours are within normal limits. No pneumothorax or pleural effusion is noted. Multiple patchy airspace opacities are noted throughout both lungs concerning for multifocal pneumonia. The visualized skeletal structures are unremarkable. IMPRESSION: Multiple patchy airspace opacities are noted bilaterally concerning for multifocal pneumonia. Electronically Signed   By: Lupita Raider M.D.   On: 08/02/2020 14:41   ECHOCARDIOGRAM COMPLETE  Result Date:  08/03/2020    ECHOCARDIOGRAM REPORT   Patient Name:   ZAYVIER CARAVELLO Date of Exam: 08/03/2020 Medical Rec #:  [id number]        Height:       67.0 in Accession #:    [id number]       Weight:       174.0 lb Date of Birth:  July 28, 1960         BSA:          1.906 m Patient Age:    60 years         BP:           116/54 mmHg Patient Gender: M                HR:           54 bpm. Exam Location:  Inpatient Procedure: 2D Echo, Cardiac Doppler and Color Doppler Indications:    A[place]re respiratory distress R06.03  History:        Patient has prior history of Echocardiogram examinations, most                 recent 04/23/2015. Previous Myocardial Infarction and CAD; Risk                 Factors:Hypertension, Diabetes and Dyslipidemia.  Sonographer:    Eulah Pont RDCS Referring Phys: Sharla Kidney Werner Lean [facility] IMPRESSIONS  1. Left ventricular ejection fraction, by estimation, is 50 to 55%. The left ventricle has low normal function. The left ventricle demonstrates regional wall motion abnormalities (see scoring diagram/findings for description). The basal-to-mid inferior wall is hypokinetic. Left ventricular diastolic parameters are consistent with Grade I diastolic dysfunction (impaired relaxation).  2. Right ventricular systolic function is normal. The right ventricular size is normal. Tricuspid regurgitation signal is inadequate for assessing PA pressure.  3. The mitral valve is normal in structure. Trivial mitral valve regurgitation. No evidence of mitral stenosis.  4.  The aortic valve is tricuspid. There is mild calcification of the aortic valve. There is mild thickening of the aortic valve. Aortic valve regurgitation is not visualized. Mild aortic valve sclerosis is present, with no evidence of aortic valve stenosis.  5. The inferior vena cava is normal in size with <50% respiratory variability, suggesting right atrial pressure of 8 mmHg. Comparison(s): Compared to prior TTE in 2016, the LVEF is now slightly improved to  50-55%. There continues to be basal-to-mid inferior wall hypokinesis. FINDINGS  Left Ventricle: Left ventricular ejection fraction, by estimation, is 50 to 55%. The left ventricle has low normal function. The left ventricle demonstrates regional wall motion abnormalities. The basal-to-mid inferior wall is hypokinetic. The left ventricular internal cavity size was normal in size. There is no left ventricular hypertrophy. Left ventricular diastolic parameters are consistent with Grade I diastolic dysfunction (impaired relaxation). Right Ventricle: The right ventricular size is normal. No increase in right ventricular wall thickness. Right ventricular systolic function is normal. Tricuspid regurgitation signal is inadequate for assessing PA pressure. Left Atrium: Left atrial size was normal in size. Right Atrium: Right atrial size was normal in size. Pericardium: There is no evidence of pericardial effusion. Mitral Valve: The mitral valve is normal in structure. There is mild thickening of the mitral valve leaflet(s). Trivial mitral valve regurgitation. No evidence of mitral valve stenosis. Tricuspid Valve: The tricuspid valve is normal in structure. Tricuspid valve regurgitation is trivial. Aortic Valve: The aortic valve is tricuspid. There is mild calcification of the aortic valve. There is mild thickening of the aortic valve. Aortic valve regurgitation is not visualized. Mild aortic valve sclerosis is present, with no evidence of aortic valve stenosis. Pulmonic Valve: The pulmonic valve was normal in structure. Pulmonic valve regurgitation is not visualized. Aorta: The aortic root and ascending aorta are structurally normal, with no evidence of dilitation. Venous: The inferior vena cava is normal in size with less than 50% respiratory variability, suggesting right atrial pressure of 8 mmHg. IAS/Shunts: No atrial level shunt detected by color flow Doppler.  LEFT VENTRICLE PLAX 2D LVIDd:         5.30 cm  Diastology  LVIDs:         3.80 cm  LV e' medial:    6.87 cm/s LV PW:         0.90 cm  LV E/e' medial:  10.1 LV IVS:        0.90 cm  LV e' lateral:   13.10 cm/s LVOT diam:     2.10 cm  LV E/e' lateral: 5.3 LV SV:         94 LV SV Index:   49 LVOT Area:     3.46 cm  RIGHT VENTRICLE RV S prime:     9.90 cm/s TAPSE (M-mode): 1.9 cm LEFT ATRIUM             Index       RIGHT ATRIUM           Index LA diam:        3.90 cm 2.05 cm/m  RA Area:     13.20 cm LA Vol (A2C):   59.2 ml 31.06 ml/m RA Volume:   33.20 ml  17.42 ml/m LA Vol (A4C):   60.4 ml 31.69 ml/m LA Biplane Vol: 60.7 ml 31.85 ml/m  AORTIC VALVE LVOT Vmax:   120.00 cm/s LVOT Vmean:  82.000 cm/s LVOT VTI:    0.270 m  AORTA Ao Root diam: 3.10 cm Ao  Asc diam:  3.00 cm MITRAL VALVE MV Area (PHT): 3.12 cm    SHUNTS MV Decel Time: 243 msec    Systemic VTI:  0.27 m MV E velocity: 69.60 cm/s  Systemic Diam: 2.10 cm MV A velocity: 68.60 cm/s MV E/A ratio:  1.01 Laurance Flatten MD Electronically signed by Laurance Flatten MD Signature Date/Time: 08/03/2020/11:33:35 AM    Final     Assessment & Plan:   Hilbert was seen today for hypertension and diabetes.  Diagnoses and all orders for this visit:  Essential hypertension- His blood pressure is not adequately well controlled but he is not willing to take indapamide.  His EKG and labs are reassuring.  Will recheck his blood pressure in 3 months. 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  He is not willing to use the basal insulin.  Will recheck his A1c in about 3 months. -     Hemoglobin A1c; Future -     Hemoglobin A1c  Hypertriglyceridemia- His triglycerides are normal now. -     Triglycerides; Future -     Triglycerides  I have discontinued Nakhi C. Erbes's indapamide and albuterol. I am also having him maintain his glucose blood, tadalafil,  nitroGLYCERIN, irbesartan, terazosin, dapagliflozin propanediol, aspirin, metFORMIN, Cholecalciferol, atorvastatin, Toujeo Max SoloStar, Insulin Pen Needle, [place] HypoPen 2-Pack, FreeStyle Libre 2 Sensor, [employer] 2 Reader, icosapent Ethyl, PARoxetine, and nebivolol.  No orders of the defined types were placed in this encounter.    Follow-up: Return in about 3 months (around 02/17/2021).  Sanda Linger, MD

## 2020-11-17 NOTE — Patient Instructions (Signed)

## 2020-12-28 ENCOUNTER — Other Ambulatory Visit: Payer: Self-pay | Admitting: Internal Medicine

## 2020-12-28 DIAGNOSIS — I10 Essential (primary) hypertension: Secondary | ICD-10-CM

## 2020-12-29 ENCOUNTER — Other Ambulatory Visit: Payer: Self-pay | Admitting: Internal Medicine

## 2020-12-29 DIAGNOSIS — I10 Essential (primary) hypertension: Secondary | ICD-10-CM

## 2020-12-29 DIAGNOSIS — I2510 Atherosclerotic heart disease of native coronary artery without angina pectoris: Secondary | ICD-10-CM

## 2020-12-29 DIAGNOSIS — E1151 Type 2 diabetes mellitus with diabetic peripheral angiopathy without gangrene: Secondary | ICD-10-CM

## 2020-12-29 DIAGNOSIS — N138 Other obstructive and reflux uropathy: Secondary | ICD-10-CM

## 2020-12-30 ENCOUNTER — Other Ambulatory Visit: Payer: Self-pay

## 2020-12-30 ENCOUNTER — Telehealth: Payer: Self-pay

## 2020-12-30 DIAGNOSIS — I10 Essential (primary) hypertension: Secondary | ICD-10-CM

## 2020-12-30 DIAGNOSIS — E1151 Type 2 diabetes mellitus with diabetic peripheral angiopathy without gangrene: Secondary | ICD-10-CM

## 2020-12-30 DIAGNOSIS — I70209 Unspecified atherosclerosis of native arteries of extremities, unspecified extremity: Secondary | ICD-10-CM

## 2020-12-30 DIAGNOSIS — I2510 Atherosclerotic heart disease of native coronary artery without angina pectoris: Secondary | ICD-10-CM

## 2020-12-30 MED ORDER — IRBESARTAN 300 MG PO TABS: 300.0000 mg | ORAL_TABLET | Freq: Every day | ORAL | 0 refills | Status: DC

## 2020-12-30 NOTE — Telephone Encounter (Signed)
Called pt, LVM stating refill was sent.  

## 2020-12-30 NOTE — Telephone Encounter (Signed)
Please advise as the pt is out of his BP meds and needs a refill for his irbesartan (AVAPRO) 300 MG tablet and his BP has been 200's/90's.  **Please give pt a call once medication is sent in.

## 2021-01-23 ENCOUNTER — Other Ambulatory Visit: Payer: Self-pay | Admitting: Internal Medicine

## 2021-01-23 DIAGNOSIS — E1151 Type 2 diabetes mellitus with diabetic peripheral angiopathy without gangrene: Secondary | ICD-10-CM

## 2021-01-23 DIAGNOSIS — I2510 Atherosclerotic heart disease of native coronary artery without angina pectoris: Secondary | ICD-10-CM

## 2021-01-23 DIAGNOSIS — E785 Hyperlipidemia, unspecified: Secondary | ICD-10-CM

## 2021-02-01 ENCOUNTER — Other Ambulatory Visit: Payer: Self-pay | Admitting: Internal Medicine

## 2021-02-01 DIAGNOSIS — I10 Essential (primary) hypertension: Secondary | ICD-10-CM

## 2021-02-04 ENCOUNTER — Other Ambulatory Visit: Payer: Self-pay | Admitting: Internal Medicine

## 2021-02-04 DIAGNOSIS — E781 Pure hyperglyceridemia: Secondary | ICD-10-CM

## 2021-02-04 DIAGNOSIS — I2510 Atherosclerotic heart disease of native coronary artery without angina pectoris: Secondary | ICD-10-CM

## 2021-02-23 ENCOUNTER — Other Ambulatory Visit: Payer: Self-pay | Admitting: Internal Medicine

## 2021-02-23 DIAGNOSIS — N138 Other obstructive and reflux uropathy: Secondary | ICD-10-CM

## 2021-02-23 DIAGNOSIS — I10 Essential (primary) hypertension: Secondary | ICD-10-CM

## 2021-03-24 ENCOUNTER — Other Ambulatory Visit: Payer: Self-pay | Admitting: Internal Medicine

## 2021-03-24 DIAGNOSIS — I70209 Unspecified atherosclerosis of native arteries of extremities, unspecified extremity: Secondary | ICD-10-CM

## 2021-03-24 DIAGNOSIS — E1151 Type 2 diabetes mellitus with diabetic peripheral angiopathy without gangrene: Secondary | ICD-10-CM

## 2021-04-07 ENCOUNTER — Telehealth: Payer: Self-pay | Admitting: Internal Medicine

## 2021-04-07 ENCOUNTER — Other Ambulatory Visit: Payer: Self-pay | Admitting: Internal Medicine

## 2021-04-07 NOTE — Telephone Encounter (Signed)
Attempted to contact pt to inform him that he needs to be seen prior to having Rx sent. VM is full and can not accept any additional messages. Will also send a MyChart.   If pt calls back schedule him accordingly. Thanks!

## 2021-04-07 NOTE — Telephone Encounter (Signed)
1.Medication Requested: PARoxetine (PAXIL) 40 MG tablet irbesartan (AVAPRO) 300 MG tablet   2. Pharmacy (Name, Street, [place]): [facility] [phone] - [place], [place] - [address]  3. On Med List: yes  4. Last Visit with PCP: 10-28-2020  5. Next visit date with PCP: n/a

## 2021-04-11 NOTE — Telephone Encounter (Signed)
To add on to previous message  Patient also threatened to come up to office tomorrow 12.21.22 & "show [place] something" & that he would be finding a new provider  Made office supervisor Alcario Drought aware of patient comments

## 2021-04-11 NOTE — Telephone Encounter (Signed)
Patient calling in  Patient upset bc he says he did not receive phone call/vm or mychart message that nurse sent regarding him needing a med refill visit  When I advised patient he would need an OV.. patient declined & said he will find him another provider

## 2021-04-12 ENCOUNTER — Other Ambulatory Visit: Payer: Self-pay | Admitting: Internal Medicine

## 2021-04-12 DIAGNOSIS — E1151 Type 2 diabetes mellitus with diabetic peripheral angiopathy without gangrene: Secondary | ICD-10-CM

## 2021-04-12 DIAGNOSIS — F418 Other specified anxiety disorders: Secondary | ICD-10-CM

## 2021-04-12 DIAGNOSIS — I10 Essential (primary) hypertension: Secondary | ICD-10-CM

## 2021-04-12 DIAGNOSIS — I2510 Atherosclerotic heart disease of native coronary artery without angina pectoris: Secondary | ICD-10-CM

## 2021-04-12 MED ORDER — IRBESARTAN 300 MG PO TABS: 300.0000 mg | ORAL_TABLET | Freq: Every day | ORAL | 0 refills | Status: DC

## 2021-04-12 MED ORDER — PAROXETINE HCL 40 MG PO TABS: 40.0000 mg | ORAL_TABLET | Freq: Every day | ORAL | 0 refills | Status: DC

## 2021-04-12 NOTE — Telephone Encounter (Signed)
1.Medication Requested: PARoxetine (PAXIL) 40 MG tablet irbesartan (AVAPRO) 300 MG tablet   2. Pharmacy (Name, Street, [place]): [facility] [phone] - [place], [place] - [address]  Phone:  [phone] Fax:  [phone]   3. On Med List: yes  4. Last Visit with PCP: 07.28.22  5. Next visit date with PCP: 12.27.22 (Seeing Jonny Ruiz)  **Requesting short supply until next week visit**  Also would like a call back at [phone]   Agent: Please be advised that RX refills may take up to 3 business days. We ask that you follow-up with your pharmacy.

## 2021-04-18 ENCOUNTER — Encounter: Payer: Self-pay | Admitting: Internal Medicine

## 2021-04-18 ENCOUNTER — Ambulatory Visit (INDEPENDENT_AMBULATORY_CARE_PROVIDER_SITE_OTHER): Payer: 59 | Admitting: Internal Medicine

## 2021-04-18 ENCOUNTER — Other Ambulatory Visit: Payer: Self-pay

## 2021-04-18 VITALS — BP 182/80 | HR 77 | Temp 98.8°F | Ht 67.0 in | Wt 174.0 lb

## 2021-04-18 DIAGNOSIS — I2510 Atherosclerotic heart disease of native coronary artery without angina pectoris: Secondary | ICD-10-CM

## 2021-04-18 DIAGNOSIS — E1151 Type 2 diabetes mellitus with diabetic peripheral angiopathy without gangrene: Secondary | ICD-10-CM | POA: Diagnosis not present

## 2021-04-18 DIAGNOSIS — E538 Deficiency of other specified B group vitamins: Secondary | ICD-10-CM

## 2021-04-18 DIAGNOSIS — I70209 Unspecified atherosclerosis of native arteries of extremities, unspecified extremity: Secondary | ICD-10-CM | POA: Diagnosis not present

## 2021-04-18 DIAGNOSIS — F418 Other specified anxiety disorders: Secondary | ICD-10-CM | POA: Diagnosis not present

## 2021-04-18 DIAGNOSIS — E559 Vitamin D deficiency, unspecified: Secondary | ICD-10-CM | POA: Diagnosis not present

## 2021-04-18 DIAGNOSIS — Z0001 Encounter for general adult medical examination with abnormal findings: Secondary | ICD-10-CM

## 2021-04-18 DIAGNOSIS — N401 Enlarged prostate with lower urinary tract symptoms: Secondary | ICD-10-CM

## 2021-04-18 DIAGNOSIS — E781 Pure hyperglyceridemia: Secondary | ICD-10-CM

## 2021-04-18 DIAGNOSIS — I10 Essential (primary) hypertension: Secondary | ICD-10-CM

## 2021-04-18 DIAGNOSIS — N138 Other obstructive and reflux uropathy: Secondary | ICD-10-CM

## 2021-04-18 LAB — BASIC METABOLIC PANEL
BUN: 22 mg/dL (ref 6–23)
CO2: 27 mEq/L (ref 19–32)
Calcium: 9.5 mg/dL (ref 8.4–10.5)
Chloride: 100 mEq/L (ref 96–112)
Creatinine, Ser: 1.09 mg/dL (ref 0.40–1.50)
GFR: 72.55 mL/min (ref >60.00)
Glucose, Bld: 269 mg/dL — ABNORMAL HIGH (ref 70–99)
Potassium: 3.9 mEq/L (ref 3.5–5.1)
Sodium: 136 mEq/L (ref 135–145)

## 2021-04-18 LAB — URINALYSIS, ROUTINE W REFLEX MICROSCOPIC
Bilirubin Urine: NEGATIVE
Hgb urine dipstick: NEGATIVE
Ketones, ur: NEGATIVE
Leukocytes,Ua: NEGATIVE
Nitrite: NEGATIVE
RBC / HPF: NONE SEEN (ref 0–?)
Specific Gravity, Urine: 1.025 (ref 1.000–1.030)
Total Protein, Urine: NEGATIVE
Urine Glucose: >=1000 — AB
Urobilinogen, UA: 0.2 (ref 0.0–1.0)
pH: 5.5 (ref 5.0–8.0)

## 2021-04-18 LAB — LIPID PANEL
Cholesterol: 219 mg/dL — ABNORMAL HIGH (ref 0–200)
HDL: 37.1 mg/dL — ABNORMAL LOW (ref >39.00)
Total CHOL/HDL Ratio: 6
Triglycerides: 465 mg/dL — ABNORMAL HIGH (ref 0.0–149.0)

## 2021-04-18 LAB — LDL CHOLESTEROL, DIRECT: Direct LDL: 138 mg/dL

## 2021-04-18 LAB — CBC WITH DIFFERENTIAL/PLATELET
Basophils Absolute: 0.1 10*3/uL (ref 0.0–0.1)
Basophils Relative: 1.5 % (ref 0.0–3.0)
Eosinophils Absolute: 0.5 10*3/uL (ref 0.0–0.7)
Eosinophils Relative: 6.8 % — ABNORMAL HIGH (ref 0.0–5.0)
HCT: 41.4 % (ref 39.0–52.0)
Hemoglobin: 14.2 g/dL (ref 13.0–17.0)
Lymphocytes Relative: 28.3 % (ref 12.0–46.0)
Lymphs Abs: 1.9 10*3/uL (ref 0.7–4.0)
MCHC: 34.2 g/dL (ref 30.0–36.0)
MCV: 85.3 fl (ref 78.0–100.0)
Monocytes Absolute: 0.7 10*3/uL (ref 0.1–1.0)
Monocytes Relative: 9.9 % (ref 3.0–12.0)
Neutro Abs: 3.6 10*3/uL (ref 1.4–7.7)
Neutrophils Relative %: 53.5 % (ref 43.0–77.0)
Platelets: 245 10*3/uL (ref 150.0–400.0)
RBC: 4.85 Mil/uL (ref 4.22–5.81)
RDW: 13.5 % (ref 11.5–15.5)
WBC: 6.7 10*3/uL (ref 4.0–10.5)

## 2021-04-18 LAB — HEPATIC FUNCTION PANEL
ALT: 18 U/L (ref 0–53)
AST: 15 U/L (ref 0–37)
Albumin: 4.3 g/dL (ref 3.5–5.2)
Alkaline Phosphatase: 72 U/L (ref 39–117)
Bilirubin, Direct: 0.1 mg/dL (ref 0.0–0.3)
Total Bilirubin: 0.3 mg/dL (ref 0.2–1.2)
Total Protein: 6.5 g/dL (ref 6.0–8.3)

## 2021-04-18 LAB — MICROALBUMIN / CREATININE URINE RATIO
Creatinine,U: 72.7 mg/dL
Microalb Creat Ratio: 1.7 mg/g (ref 0.0–30.0)
Microalb, Ur: 1.2 mg/dL (ref 0.0–1.9)

## 2021-04-18 LAB — HEMOGLOBIN A1C: Hgb A1c MFr Bld: 8.1 % — ABNORMAL HIGH (ref 4.6–6.5)

## 2021-04-18 LAB — VITAMIN B12: Vitamin B-12: 318 pg/mL (ref 211–911)

## 2021-04-18 LAB — TSH: TSH: 2.95 u[IU]/mL (ref 0.35–5.50)

## 2021-04-18 LAB — VITAMIN D 25 HYDROXY (VIT D DEFICIENCY, FRACTURES): VITD: 31.06 ng/mL (ref 30.00–100.00)

## 2021-04-18 LAB — PSA: PSA: 1.73 ng/mL (ref 0.10–4.00)

## 2021-04-18 MED ORDER — INDAPAMIDE 1.25 MG PO TABS: 1.2500 mg | ORAL_TABLET | Freq: Every day | ORAL | 3 refills | Status: DC

## 2021-04-18 MED ORDER — TOUJEO MAX SOLOSTAR 300 UNIT/ML ~~LOC~~ SOPN: 30.0000 [IU] | PEN_INJECTOR | Freq: Every day | SUBCUTANEOUS | 3 refills | Status: DC

## 2021-04-18 MED ORDER — NEBIVOLOL HCL 5 MG PO TABS: 5.0000 mg | ORAL_TABLET | Freq: Every day | ORAL | 3 refills | Status: DC

## 2021-04-18 MED ORDER — METFORMIN HCL ER 750 MG PO TB24: 1500.0000 mg | ORAL_TABLET | Freq: Every day | ORAL | 3 refills | Status: DC

## 2021-04-18 MED ORDER — PAROXETINE HCL 40 MG PO TABS: 40.0000 mg | ORAL_TABLET | Freq: Every day | ORAL | 3 refills | Status: DC

## 2021-04-18 MED ORDER — TERAZOSIN HCL 2 MG PO CAPS: ORAL_CAPSULE | ORAL | 3 refills | Status: DC

## 2021-04-18 MED ORDER — IRBESARTAN 300 MG PO TABS: 300.0000 mg | ORAL_TABLET | Freq: Every day | ORAL | 3 refills | Status: DC

## 2021-04-18 MED ORDER — DAPAGLIFLOZIN PROPANEDIOL 10 MG PO TABS
10.0000 mg | ORAL_TABLET | Freq: Every day | ORAL | 3 refills | Status: DC
Start: 2021-04-18 — End: 2022-04-25

## 2021-04-18 MED ORDER — ICOSAPENT ETHYL 1 G PO CAPS: ORAL_CAPSULE | ORAL | 3 refills | Status: DC

## 2021-04-18 MED ORDER — ATORVASTATIN CALCIUM 80 MG PO TABS
80.0000 mg | ORAL_TABLET | Freq: Every day | ORAL | 3 refills | Status: DC
Start: 2021-04-18 — End: 2022-04-25

## 2021-04-18 NOTE — Progress Notes (Signed)
Patient ID: Justin Galvan, male   DOB: Dec 15, 1960, 60 y.o.   MRN: [id number]         Chief Complaint:: wellness exam and Medication Refill  As PCP not available       HPI:  Justin Galvan is a 60 y.o. male here for wellness exam; declines covid booster, shingrix, flu, pneumovax, also declines optho referral and colonoscopy; o/w up to date                        Also Denies worsening depressive symptoms, suicidal ideation, or panic; but has ongoing anxiety, much worse in the past 3 days since he ran out of paxil medication, needs refill.  Pt denies chest pain, increased sob or doe, wheezing, orthopnea, PND, increased LE swelling, palpitations, dizziness or syncope.   Pt denies polydipsia, polyuria, or new focal neuro s/s.  Pt denies fever, wt loss, night sweats, loss of appetite, or other constitutional symptoms      Wt Readings from Last 3 Encounters:  04/18/21 174 lb (78.9 kg)  11/17/20 173 lb (78.5 kg)  08/15/20 169 lb (76.7 kg)   BP Readings from Last 3 Encounters:  04/18/21 (!) 182/80  11/17/20 (!) 152/84  08/15/20 138/78   Immunization History  Administered Date(s) Administered   Influenza Split 04/10/2011   Pneumococcal Polysaccharide-23 08/19/2008, 01/27/2019   Td 08/19/2008   Tdap 01/27/2019  There are no preventive care reminders to display for this patient.    Past Medical History:  Diagnosis Date   Anginal pain (HCC)    Arthritis    "fingers" (04/22/2015)   BPH (benign prostatic hypertrophy)    CAD, NATIVE VESSEL, Hx. 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) 08/19/2008   DM2 (diabetes mellitus, type 2) (HCC)    Erectile dysfunction due to arterial insufficiency 10/10/2016   Essential hypertension 08/19/2008   HLD (hyperlipidemia)    HTN (hypertension)     Hypertriglyceridemia 05/03/2009        Kidney stones    MI (myocardial infarction) (HCC) 1997; ~ 2002; 04/2013   MRSA carrier 11/20/2016   S/P coronary artery stent placement -mid LAD, 05/06/13 Promus Premier DES 05/07/2013   Past Surgical History:  Procedure Laterality Date   CARDIAC CATHETERIZATION N/A 04/26/2015   Procedure: Left Heart Cath and Coronary Angiography;  Surgeon: Kathleene Hazel, MD;  Location: [facility] INVASIVE CV LAB;  Service: Cardiovascular;  Laterality: N/A;   CORONARY ANGIOPLASTY WITH STENT PLACEMENT  1997 - 04/2013   "got several stents; may 8 or 9"   CORONARY ANGIOPLASTY WITH STENT PLACEMENT  05/06/2013   DES/LAD               DR Clifton Shakayla Hickox   LEFT HEART CATHETERIZATION WITH CORONARY ANGIOGRAM N/A 05/06/2013   Procedure: LEFT HEART CATHETERIZATION WITH CORONARY ANGIOGRAM;  Surgeon: Kathleene Hazel, MD;  Location: [facility] CATH LAB;  Service: Cardiovascular;  Laterality: N/A;    reports that he quit smoking about 24 years ago. His smoking use included cigarettes. He has a 60.50 pack-year smoking history. He has never used smokeless tobacco. He reports current alcohol use. He reports that he does not use drugs. family history includes Heart disease in his mother; Hypertension in an other family member. No Known  Allergies Current Outpatient Medications on File Prior to Visit  Medication Sig Dispense Refill   aspirin 81 MG EC tablet Take 1 tablet (81 mg total) by mouth daily. 90 tablet 1   Continuous Blood Gluc Receiver (FREESTYLE LIBRE 2 READER) DEVI 1 Act by Does not apply route daily. 2 each 5   Continuous Blood Gluc Sensor (FREESTYLE LIBRE 2 SENSOR) MISC APPLY 1 SENSOR AS DIRECTED EVERY 14 DAYS BY DOES NOT APPLY ROUTE DAILY. 2 each 5   glucose blood test strip Test up to two times daily 100 each 12   Cholecalciferol 50 MCG (2000 UT) TABS Take 1 tablet (2,000 Units total) by mouth daily. Take with food or whole milk (Patient not taking: Reported on 04/18/2021) 90 tablet 3    Glucagon (GVOKE HYPOPEN 2-PACK) 1 MG/0.2ML SOAJ Inject 1 Act into the skin daily as needed. (Patient not taking: Reported on 04/18/2021) 2 mL 5   Insulin Pen Needle 32G X 6 MM MISC 1 Act by Does not apply route daily. (Patient not taking: Reported on 04/18/2021) 100 each 1   nitroGLYCERIN (NITROSTAT) 0.4 MG SL tablet Place 1 tablet (0.4 mg total) under the tongue every 5 (five) minutes as needed for chest pain. 25 tablet 6   tadalafil (CIALIS) 20 MG tablet Take 1 tablet (20 mg total) by mouth daily as needed for erectile dysfunction. (Patient not taking: Reported on 04/18/2021) 8 tablet 0   No current facility-administered medications on file prior to visit.        ROS:  All others reviewed and negative.  Objective        PE:  BP (!) 182/80 (BP Location: Left Arm, Patient Position: Sitting, Cuff Size: Normal)    Pulse 77    Temp 98.8 F (37.1 C) (Oral)    Ht 5\' 7"  (1.702 m)    Wt 174 lb (78.9 kg)    SpO2 97%    BMI 27.25 kg/m                 Constitutional: Pt appears in NAD               HENT: Head: NCAT.                Right Ear: External ear normal.                 Left Ear: External ear normal.                Eyes: . Pupils are equal, round, and reactive to light. Conjunctivae and EOM are normal               Nose: without d/c or deformity               Neck: Neck supple. Gross normal ROM               Cardiovascular: Normal rate and regular rhythm.                 Pulmonary/Chest: Effort normal and breath sounds without rales or wheezing.                Abd:  Soft, NT, ND, + BS, no organomegaly               Neurological: Pt is alert. At baseline orientation, motor grossly intact               Skin: Skin is warm. No rashes, no other new lesions,  LE edema - none               Psychiatric: Pt behavior is normal without agitation, 2+ nervous  Micro: none  Cardiac tracings I have personally interpreted today:  none  Pertinent Radiological findings (summarize): none   Lab Results   Component Value Date   WBC 6.7 04/18/2021   HGB 14.2 04/18/2021   HCT 41.4 04/18/2021   PLT 245.0 04/18/2021   GLUCOSE 269 (H) 04/18/2021   CHOL 219 (H) 04/18/2021   TRIG (H) 04/18/2021    465.0 Triglyceride is over 400; calculations on Lipids are invalid.   HDL 37.10 (L) 04/18/2021   LDLDIRECT 138.0 04/18/2021   LDLCALC 67 01/27/2019   ALT 18 04/18/2021   AST 15 04/18/2021   NA 136 04/18/2021   K 3.9 04/18/2021   CL 100 04/18/2021   CREATININE 1.09 04/18/2021   BUN 22 04/18/2021   CO2 27 04/18/2021   TSH 2.95 04/18/2021   PSA 1.73 04/18/2021   INR 0.9 08/15/2020   HGBA1C 8.1 (H) 04/18/2021   MICROALBUR 1.2 04/18/2021   Assessment/Plan:  DAKHARI ZUVER is a 61 y.o. White or Caucasian [1] male with  has a past medical history of Anginal pain (HCC), Arthritis, BPH (benign prostatic hypertrophy), CAD, NATIVE VESSEL, Hx. 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  CAD, NATIVE VESSEL, Hx. stent-RCA 1997; 2 Taxusprox & mid RCA 06/2003, cath 05/06/13 100% RCA, mid LAD disease Remote hx likely related to smoking long discontinued, has been lost to f/u with Dr Scharlene Gloss last seen oct 2020 - pt declines referral back for now, will plan to call on his own  Encounter for well adult exam with abnormal findings Age and sex appropriate education and counseling updated with regular exercise and diet Referrals for preventative services - declines  optho or colonoscopy referral Immunizations addressed - decliens covid booster, shingrix, flu, pneumovax Smoking counseling  - none needed Evidence for depression or other mood disorder - chronic anxiety depression worsening, needs med refill Most recent labs reviewed. 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   CHOLHDL 6 04/18/2021   Uncontrolled, for restart vascepa, low fat diet  Depression with anxiety With situational worsening uncontrolled, for restart paxil asd, declines counseling referral,  to f/u any worsening symptoms or concerns  Essential hypertension BP Readings from Last 3 Encounters:  04/18/21 Marland Kitchen)  182/80  11/17/20 (!) 152/84  08/15/20 138/78   Severe uncontrolled with recent not taking med tx, pt to restart medical treatment lozol, avapro, bystolic and f/u PCP   BPH with obstruction/lower urinary tract symptoms Also for restart med tx  Vitamin D deficiency disease Last vitamin D Lab Results  Component Value Date   VD25OH 31.06 04/18/2021   Low, to restart oral replacement  Followup: Return in about 6 months (around 10/17/2021).  Oliver Barre, MD 04/19/2021 4:37 AM [facility]  Medicine

## 2021-04-18 NOTE — Patient Instructions (Addendum)
Please continue all other medications as before, and refills have been done if requested.  Please have the pharmacy call with any other refills you may need.  Please continue your efforts at being more active, low cholesterol diet, and weight control.  You are otherwise up to date with prevention measures today.  Please keep your appointments with your specialists as you may have planned  Please go to the LAB at the blood drawing area for the tests to be done  You will be contacted by phone if any changes need to be made immediately.  Otherwise, you will receive a letter about your results with an explanation, but please check with MyChart first.  This message exchange with [place] may be billed to your insurance if the message requires medical expertise and takes more than a few minutes of your provider's time. Your copay and deductible will apply to this charge. Do you want to get advice via this message? Reply yes to get advice. Another option is to schedule a visit with our office to address this message.    Please make an Appointment to return in 6 months, or sooner if needed, to Dr Yetta Barre

## 2021-04-19 ENCOUNTER — Encounter: Payer: Self-pay | Admitting: Internal Medicine

## 2021-04-19 NOTE — Assessment & Plan Note (Signed)
With situational worsening uncontrolled, for restart paxil asd, declines counseling referral,  to f/u any worsening symptoms or concerns

## 2021-04-19 NOTE — Assessment & Plan Note (Signed)
Age and sex appropriate education and counseling updated with regular exercise and diet Referrals for preventative services - declines optho or colonoscopy referral Immunizations addressed - decliens covid booster, shingrix, flu, pneumovax Smoking counseling  - none needed Evidence for depression or other mood disorder - chronic anxiety depression worsening, needs med refill Most recent labs reviewed. I have personally reviewed and have noted: 1) the patient's medical and social history 2) The patient's current medications and supplements 3) The patient's height, weight, and BMI have been recorded in the chart

## 2021-04-19 NOTE — Assessment & Plan Note (Signed)
Lab Results  Component Value Date   HGBA1C 8.1 (H) 04/18/2021   Uncontrolled, likely some degree of non compliance it seems, pt to continue current medical treatment farxiga, toujeo, metformin as declines change

## 2021-04-19 NOTE — Assessment & Plan Note (Signed)
Also for restart med tx

## 2021-04-19 NOTE — Assessment & Plan Note (Addendum)
Remote hx likely related to smoking long discontinued, has been lost to f/u with Dr Scharlene Gloss last seen oct 2020 - pt declines referral back for now, will plan to call on his own

## 2021-04-19 NOTE — Assessment & Plan Note (Signed)
BP Readings from Last 3 Encounters:  04/18/21 (!) 182/80  11/17/20 (!) 152/84  08/15/20 138/78   Severe uncontrolled with recent not taking med tx, pt to restart medical treatment lozol, avapro, bystolic and f/u PCP

## 2021-04-19 NOTE — Assessment & Plan Note (Signed)
Lab Results  Component Value Date   CHOL 219 (H) 04/18/2021   HDL 37.10 (L) 04/18/2021   LDLCALC 67 01/27/2019   LDLDIRECT 138.0 04/18/2021   TRIG (H) 04/18/2021    465.0 Triglyceride is over 400; calculations on Lipids are invalid.   CHOLHDL 6 04/18/2021   Uncontrolled, for restart vascepa, low fat diet

## 2021-04-19 NOTE — Assessment & Plan Note (Signed)
Last vitamin D Lab Results  Component Value Date   VD25OH 31.06 04/18/2021   Low, to restart oral replacement

## 2021-04-20 ENCOUNTER — Encounter: Payer: Self-pay | Admitting: Internal Medicine

## 2021-05-16 ENCOUNTER — Ambulatory Visit: Payer: 59 | Admitting: Internal Medicine

## 2021-06-02 ENCOUNTER — Other Ambulatory Visit: Payer: Self-pay | Admitting: Internal Medicine

## 2021-06-02 DIAGNOSIS — I2510 Atherosclerotic heart disease of native coronary artery without angina pectoris: Secondary | ICD-10-CM

## 2021-08-07 ENCOUNTER — Telehealth: Payer: Self-pay | Admitting: Internal Medicine

## 2021-08-07 ENCOUNTER — Other Ambulatory Visit: Payer: Self-pay | Admitting: Internal Medicine

## 2021-08-07 DIAGNOSIS — E1151 Type 2 diabetes mellitus with diabetic peripheral angiopathy without gangrene: Secondary | ICD-10-CM

## 2021-08-07 MED ORDER — FREESTYLE LIBRE 2 READER DEVI: 1.0000 | Freq: Every day | 5 refills | Status: DC

## 2021-08-07 NOTE — Telephone Encounter (Signed)
Wife states pt has lost his Continuous Blood Gluc Receiver (FREESTYLE LIBRE 2 READER) DEVI ? ?Wife requesting a new rx for reader ? ?Pt was last seen by provider 11-17-2020 ? ?Next ov 09-13-2021 ? ?Pharmacy: [id number] - [place], [place] - [address] ?

## 2021-09-13 ENCOUNTER — Encounter: Payer: Self-pay | Admitting: Internal Medicine

## 2021-09-13 ENCOUNTER — Ambulatory Visit (INDEPENDENT_AMBULATORY_CARE_PROVIDER_SITE_OTHER): Payer: 59 | Admitting: Internal Medicine

## 2021-09-13 VITALS — BP 126/76 | HR 72 | Temp 97.7°F | Ht 67.0 in | Wt 165.0 lb

## 2021-09-13 DIAGNOSIS — I70209 Unspecified atherosclerosis of native arteries of extremities, unspecified extremity: Secondary | ICD-10-CM | POA: Diagnosis not present

## 2021-09-13 DIAGNOSIS — E1151 Type 2 diabetes mellitus with diabetic peripheral angiopathy without gangrene: Secondary | ICD-10-CM | POA: Diagnosis not present

## 2021-09-13 DIAGNOSIS — I10 Essential (primary) hypertension: Secondary | ICD-10-CM

## 2021-09-13 DIAGNOSIS — E781 Pure hyperglyceridemia: Secondary | ICD-10-CM | POA: Diagnosis not present

## 2021-09-13 DIAGNOSIS — I2510 Atherosclerotic heart disease of native coronary artery without angina pectoris: Secondary | ICD-10-CM

## 2021-09-13 DIAGNOSIS — Z1211 Encounter for screening for malignant neoplasm of colon: Secondary | ICD-10-CM

## 2021-09-13 DIAGNOSIS — Z23 Encounter for immunization: Secondary | ICD-10-CM | POA: Diagnosis not present

## 2021-09-13 DIAGNOSIS — E785 Hyperlipidemia, unspecified: Secondary | ICD-10-CM | POA: Diagnosis not present

## 2021-09-13 LAB — BASIC METABOLIC PANEL
BUN: 20 mg/dL (ref 6–23)
CO2: 29 mEq/L (ref 19–32)
Calcium: 10 mg/dL (ref 8.4–10.5)
Chloride: 100 mEq/L (ref 96–112)
Creatinine, Ser: 0.92 mg/dL (ref 0.40–1.50)
GFR: 88.67 mL/min (ref >60.00)
Glucose, Bld: 157 mg/dL — ABNORMAL HIGH (ref 70–99)
Potassium: 4.2 mEq/L (ref 3.5–5.1)
Sodium: 139 mEq/L (ref 135–145)

## 2021-09-13 LAB — LIPID PANEL
Cholesterol: 140 mg/dL (ref 0–200)
HDL: 35.8 mg/dL — ABNORMAL LOW (ref >39.00)
LDL Cholesterol: 71 mg/dL (ref 0–99)
NonHDL: 104.66
Total CHOL/HDL Ratio: 4
Triglycerides: 166 mg/dL — ABNORMAL HIGH (ref 0.0–149.0)
VLDL: 33.2 mg/dL (ref 0.0–40.0)

## 2021-09-13 LAB — HEMOGLOBIN A1C: Hgb A1c MFr Bld: 8.3 % — ABNORMAL HIGH (ref 4.6–6.5)

## 2021-09-13 NOTE — Patient Instructions (Signed)

## 2021-09-13 NOTE — Progress Notes (Unsigned)
Subjective:  Patient ID: Justin Galvan, male    DOB: 1961/01/27  Age: 60 y.o. MRN: [id number]  CC: Hypertension, Hyperlipidemia, and Diabetes   HPI Justin Galvan presents for f/up -  He is active and denies chest pain, shortness of breath, diaphoresis, dizziness, lightheadedness, or edema.  He also denies polys.  Outpatient Medications Prior to Visit  Medication Sig Dispense Refill   ASPIRIN LOW DOSE 81 MG EC tablet TAKE 1 TABLET BY MOUTH EVERY DAY 90 tablet 1   atorvastatin (LIPITOR) 80 MG tablet Take 1 tablet (80 mg total) by mouth daily. at 6pm 90 tablet 3   Continuous Blood Gluc Receiver (FREESTYLE LIBRE 2 READER) DEVI 1 Act by Does not apply route daily. 2 each 5   Continuous Blood Gluc Sensor (FREESTYLE LIBRE 2 SENSOR) MISC APPLY 1 SENSOR AS DIRECTED EVERY 14 DAYS BY DOES NOT APPLY ROUTE DAILY. 2 each 5   dapagliflozin propanediol (FARXIGA) 10 MG TABS tablet Take 1 tablet (10 mg total) by mouth daily. 90 tablet 3   glucose blood test strip Test up to two times daily 100 each 12   icosapent Ethyl (VASCEPA) 1 g capsule TAKE 2 CAPSULES BY MOUTH 2 TIMES DAILY. 360 capsule 3   indapamide (LOZOL) 1.25 MG tablet Take 1 tablet (1.25 mg total) by mouth daily. 90 tablet 3   Insulin Pen Needle 32G X 6 MM MISC 1 Act by Does not apply route daily. 100 each 1   irbesartan (AVAPRO) 300 MG tablet Take 1 tablet (300 mg total) by mouth daily. 90 tablet 3   metFORMIN (GLUCOPHAGE-XR) 750 MG 24 hr tablet Take 2 tablets (1,500 mg total) by mouth daily with breakfast. 180 tablet 3   PARoxetine (PAXIL) 40 MG tablet Take 1 tablet (40 mg total) by mouth daily. 90 tablet 3   tadalafil (CIALIS) 20 MG tablet Take 1 tablet (20 mg total) by mouth daily as needed for erectile dysfunction. 8 tablet 0   terazosin (HYTRIN) 2 MG capsule TAKE 1 CAPSULE BY MOUTH EVERYDAY AT BEDTIME 90 capsule 3   Cholecalciferol 50 MCG (2000 UT) TABS Take 1 tablet (2,000 Units total) by mouth daily. Take with food or whole milk 90  tablet 3   Glucagon (GVOKE HYPOPEN 2-PACK) 1 MG/0.2ML SOAJ Inject 1 Act into the skin daily as needed. 2 mL 5   insulin glargine, 2 Unit Dial, (TOUJEO MAX SOLOSTAR) 300 UNIT/ML Solostar Pen Inject 30 Units into the skin daily. 15 mL 3   nebivolol (BYSTOLIC) 5 MG tablet Take 1 tablet (5 mg total) by mouth daily. 90 tablet 3   nitroGLYCERIN (NITROSTAT) 0.4 MG SL tablet Place 1 tablet (0.4 mg total) under the tongue every 5 (five) minutes as needed for chest pain. 25 tablet 6   No facility-administered medications prior to visit.    ROS Review of Systems  Constitutional:  Negative for chills, diaphoresis, fatigue and fever.  HENT: Negative.    Eyes: Negative.   Respiratory:  Negative for cough, chest tightness, shortness of breath and wheezing.   Cardiovascular:  Negative for chest pain, palpitations and leg swelling.  Gastrointestinal:  Negative for abdominal pain, constipation, diarrhea, nausea and vomiting.  Endocrine: Negative.   Genitourinary: Negative.  Negative for difficulty urinating.  Musculoskeletal:  Negative for arthralgias and myalgias.  Skin: Negative.   Neurological:  Negative for dizziness, weakness, light-headedness, numbness and headaches.  Hematological:  Negative for adenopathy. Does not bruise/bleed easily.  Psychiatric/Behavioral: Negative.     Objective:  BP 126/76 (BP Location: Right Arm, Patient Position: Sitting, Cuff Size: Large)   Pulse 72   Temp 97.7 F (36.5 C) (Oral)   Ht 5\' 7"  (1.702 m)   Wt 165 lb (74.8 kg)   SpO2 96%   BMI 25.84 kg/m   BP Readings from Last 3 Encounters:  09/13/21 126/76  04/18/21 (!) 182/80  11/17/20 (!) 152/84    Wt Readings from Last 3 Encounters:  09/13/21 165 lb (74.8 kg)  04/18/21 174 lb (78.9 kg)  11/17/20 173 lb (78.5 kg)    Physical Exam Vitals reviewed.  Constitutional:      Appearance: Normal appearance.  HENT:     Nose: Nose normal.     Mouth/Throat:     Mouth: Mucous membranes are moist.  Eyes:      General: No scleral icterus.    Conjunctiva/sclera: Conjunctivae normal.  Cardiovascular:     Rate and Rhythm: Normal rate and regular rhythm.     Heart sounds: No murmur heard. Pulmonary:     Effort: Pulmonary effort is normal.     Breath sounds: No stridor. No wheezing, rhonchi or rales.  Abdominal:     General: Abdomen is flat.     Palpations: There is no mass.     Tenderness: There is no abdominal tenderness. There is no guarding.     Hernia: No hernia is present.  Musculoskeletal:        General: Normal range of motion.     Cervical back: Neck supple.     Right lower leg: No edema.     Left lower leg: No edema.  Lymphadenopathy:     Cervical: No cervical adenopathy.  Skin:    General: Skin is warm and dry.  Neurological:     General: No focal deficit present.     Mental Status: He is alert.  Psychiatric:        Mood and Affect: Mood normal.        Behavior: Behavior normal.    Lab Results  Component Value Date   WBC 6.7 04/18/2021   HGB 14.2 04/18/2021   HCT 41.4 04/18/2021   PLT 245.0 04/18/2021   GLUCOSE 157 (H) 09/13/2021   CHOL 140 09/13/2021   TRIG 166.0 (H) 09/13/2021   HDL 35.80 (L) 09/13/2021   LDLDIRECT 138.0 04/18/2021   LDLCALC 71 09/13/2021   ALT 18 04/18/2021   AST 15 04/18/2021   NA 139 09/13/2021   K 4.2 09/13/2021   CL 100 09/13/2021   CREATININE 0.92 09/13/2021   BUN 20 09/13/2021   CO2 29 09/13/2021   TSH 2.95 04/18/2021   PSA 1.73 04/18/2021   INR 0.9 08/15/2020   HGBA1C 8.3 (H) 09/13/2021   MICROALBUR 1.2 04/18/2021    DG Chest Port 1 View  Result Date: 08/03/2020 CLINICAL DATA:  Shortness of breath EXAM: PORTABLE CHEST 1 VIEW COMPARISON:  August 02, 2020 FINDINGS: Ill-defined airspace opacity persists in the right upper lobe. More subtle opacity is noted in each lung bases. No consolidation. Heart size and pulmonary vascularity are normal. No adenopathy. There is aortic atherosclerosis. No bone lesions. IMPRESSION: Ill-defined areas  of airspace opacity, most notable in the right upper lobe. Question atypical organism pneumonia. No consolidation. Stable cardiac silhouette. Aortic Atherosclerosis (ICD10-I70.0). Electronically Signed   By: Lowella Grip III M.D.   On: 08/03/2020 08:10   DG Chest Port 1 View  Result Date: 08/02/2020 CLINICAL DATA:  Dyspnea. EXAM: PORTABLE CHEST 1 VIEW COMPARISON:  April 22, 2015. FINDINGS: The heart size and mediastinal contours are within normal limits. No pneumothorax or pleural effusion is noted. Multiple patchy airspace opacities are noted throughout both lungs concerning for multifocal pneumonia. The visualized skeletal structures are unremarkable. IMPRESSION: Multiple patchy airspace opacities are noted bilaterally concerning for multifocal pneumonia. Electronically Signed   By: Marijo Conception M.D.   On: 08/02/2020 14:41   ECHOCARDIOGRAM COMPLETE  Result Date: 08/03/2020    ECHOCARDIOGRAM REPORT   Patient Name:   Justin Galvan Date of Exam: 08/03/2020 Medical Rec #:  [id number]        Height:       67.0 in Accession #:    [id number]       Weight:       174.0 lb Date of Birth:  Sep 18, 1960         BSA:          1.906 m Patient Age:    14 years         BP:           116/54 mmHg Patient Gender: M                HR:           54 bpm. Exam Location:  Inpatient Procedure: 2D Echo, Cardiac Doppler and Color Doppler Indications:    [place] respiratory distress R06.03  History:        Patient has prior history of Echocardiogram examinations, most                 recent 04/23/2015. Previous Myocardial Infarction and CAD; Risk                 Factors:Hypertension, Diabetes and Dyslipidemia.  Sonographer:    Bernadene Person RDCS Referring Phys: [place]  1. Left ventricular ejection fraction, by estimation, is 50 to 55%. The left ventricle has low normal function. The left ventricle demonstrates regional wall motion abnormalities (see scoring diagram/findings for description). The  basal-to-mid inferior wall is hypokinetic. Left ventricular diastolic parameters are consistent with Grade I diastolic dysfunction (impaired relaxation).  2. Right ventricular systolic function is normal. The right ventricular size is normal. Tricuspid regurgitation signal is inadequate for assessing PA pressure.  3. The mitral valve is normal in structure. Trivial mitral valve regurgitation. No evidence of mitral stenosis.  4. The aortic valve is tricuspid. There is mild calcification of the aortic valve. There is mild thickening of the aortic valve. Aortic valve regurgitation is not visualized. Mild aortic valve sclerosis is present, with no evidence of aortic valve stenosis.  5. The inferior vena cava is normal in size with <50% respiratory variability, suggesting right atrial pressure of 8 mmHg. Comparison(s): Compared to prior TTE in 2016, the LVEF is now slightly improved to 50-55%. There continues to be basal-to-mid inferior wall hypokinesis. FINDINGS  Left Ventricle: Left ventricular ejection fraction, by estimation, is 50 to 55%. The left ventricle has low normal function. The left ventricle demonstrates regional wall motion abnormalities. The basal-to-mid inferior wall is hypokinetic. The left ventricular internal cavity size was normal in size. There is no left ventricular hypertrophy. Left ventricular diastolic parameters are consistent with Grade I diastolic dysfunction (impaired relaxation). Right Ventricle: The right ventricular size is normal. No increase in right ventricular wall thickness. Right ventricular systolic function is normal. Tricuspid regurgitation signal is inadequate for assessing PA pressure. Left Atrium: Left atrial size was normal in size. Right Atrium: Right atrial  size was normal in size. Pericardium: There is no evidence of pericardial effusion. Mitral Valve: The mitral valve is normal in structure. There is mild thickening of the mitral valve leaflet(s). Trivial mitral valve  regurgitation. No evidence of mitral valve stenosis. Tricuspid Valve: The tricuspid valve is normal in structure. Tricuspid valve regurgitation is trivial. Aortic Valve: The aortic valve is tricuspid. There is mild calcification of the aortic valve. There is mild thickening of the aortic valve. Aortic valve regurgitation is not visualized. Mild aortic valve sclerosis is present, with no evidence of aortic valve stenosis. Pulmonic Valve: The pulmonic valve was normal in structure. Pulmonic valve regurgitation is not visualized. Aorta: The aortic root and ascending aorta are structurally normal, with no evidence of dilitation. Venous: The inferior vena cava is normal in size with less than 50% respiratory variability, suggesting right atrial pressure of 8 mmHg. IAS/Shunts: No atrial level shunt detected by color flow Doppler.  LEFT VENTRICLE PLAX 2D LVIDd:         5.30 cm  Diastology LVIDs:         3.80 cm  LV e' medial:    6.87 cm/s LV PW:         0.90 cm  LV E/e' medial:  10.1 LV IVS:        0.90 cm  LV e' lateral:   13.10 cm/s LVOT diam:     2.10 cm  LV E/e' lateral: 5.3 LV SV:         94 LV SV Index:   49 LVOT Area:     3.46 cm  RIGHT VENTRICLE RV S prime:     9.90 cm/s TAPSE (M-mode): 1.9 cm LEFT ATRIUM             Index       RIGHT ATRIUM           Index LA diam:        3.90 cm 2.05 cm/m  RA Area:     13.20 cm LA Vol (A2C):   59.2 ml 31.06 ml/m RA Volume:   33.20 ml  17.42 ml/m LA Vol (A4C):   60.4 ml 31.69 ml/m LA Biplane Vol: 60.7 ml 31.85 ml/m  AORTIC VALVE LVOT Vmax:   120.00 cm/s LVOT Vmean:  82.000 cm/s LVOT VTI:    0.270 m  AORTA Ao Root diam: 3.10 cm Ao Asc diam:  3.00 cm MITRAL VALVE MV Area (PHT): 3.12 cm    SHUNTS MV Decel Time: 243 msec    Systemic VTI:  0.27 m MV E velocity: 69.60 cm/s  Systemic Diam: 2.10 cm MV A velocity: 68.60 cm/s MV E/A ratio:  1.01 Gwyndolyn Kaufman MD Electronically signed by Gwyndolyn Kaufman MD Signature Date/Time: 08/03/2020/11:33:35 AM    Final     Assessment &  Plan:   Dayten was seen today for hypertension, hyperlipidemia and diabetes.  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  Will add a GLP-1 agonist. -     Basic metabolic panel; Future -     Hemoglobin A1c; Future -     HM Diabetes Foot Exam -     Ambulatory referral to Ophthalmology -     Hemoglobin A1c -     Basic metabolic panel -     Semaglutide (RYBELSUS) 3 MG TABS; Take 3 mg by mouth daily.  Essential hypertension- His blood pressure is adequately  well controlled. -     Basic metabolic panel; Future -     Basic metabolic panel  Hypertriglyceridemia -     Lipid panel; Future -     Lipid panel  Hyperlipidemia with target LDL less than 70- LDL goal achieved. Doing well on the statin  -     Lipid panel; Future -     Lipid panel  Screen for colon cancer -     Cologuard  Need for vaccination -     Pneumococcal conjugate vaccine 20-valent (Prevnar 20)  Atherosclerosis of native coronary artery of native heart without angina pectoris -     Semaglutide (RYBELSUS) 3 MG TABS; Take 3 mg by mouth daily.  Other orders -     Varicella-zoster vaccine IM (Shingrix)   I have discontinued Damondre C. Hudlow's Cholecalciferol, Gvoke HypoPen 2-Pack, nebivolol, and Toujeo Max SoloStar. I am also having him start on Rybelsus. Additionally, I am having him maintain his glucose blood, tadalafil, nitroGLYCERIN, Insulin Pen Needle, FreeStyle Libre 2 Sensor, terazosin, PARoxetine, metFORMIN, irbesartan, indapamide, icosapent Ethyl, dapagliflozin propanediol, atorvastatin, Aspirin Low Dose, and FreeStyle Libre 2 Reader.  Meds ordered this encounter  Medications   Semaglutide (RYBELSUS) 3 MG TABS    Sig: Take 3 mg by mouth daily.    Dispense:  30 tablet    Refill:  0     Follow-up: Return in about 6 months (around 03/16/2022).  Scarlette Calico, MD

## 2021-09-16 MED ORDER — RYBELSUS 3 MG PO TABS: 3.0000 mg | ORAL_TABLET | Freq: Every day | ORAL | 0 refills | Status: DC

## 2021-09-21 ENCOUNTER — Telehealth: Payer: Self-pay | Admitting: Internal Medicine

## 2021-09-21 NOTE — Telephone Encounter (Signed)
Patient would like to know if he needs to be on medication for his diabetes - since he has lost 20 lbs in the last month.  Patient would like someone to give him a call concerning his lab results

## 2021-09-21 NOTE — Telephone Encounter (Signed)
Pt's wife, Marylene Land, has been informed of result note dated 5/24 "Your A1C is too high. Please add rybelsus to your current medications for diabetes.The other labs are okay". Marylene Land stated that pt is still tired and fatigued and not feeling well. She is seriously concerned about his weight lost and mentioned that he has not been trying to lose it. Marylene Land is inquiring about recommendations for the high triglycerides and how to lower it. Should pt follow back up with an OV?  Please advise.

## 2021-09-22 NOTE — Telephone Encounter (Signed)
Pt unable to come in today Fri 6/2 to discuss.  However I have scheduled him for [facility] 6/5 @ 8.20am.

## 2021-09-25 ENCOUNTER — Ambulatory Visit: Payer: 59 | Admitting: Internal Medicine

## 2021-10-03 LAB — COLOGUARD: Cologuard: NEGATIVE

## 2021-10-04 LAB — COLOGUARD: COLOGUARD: NEGATIVE

## 2021-10-19 ENCOUNTER — Other Ambulatory Visit: Payer: Self-pay | Admitting: Internal Medicine

## 2021-10-19 DIAGNOSIS — I70209 Unspecified atherosclerosis of native arteries of extremities, unspecified extremity: Secondary | ICD-10-CM

## 2021-10-19 DIAGNOSIS — I2510 Atherosclerotic heart disease of native coronary artery without angina pectoris: Secondary | ICD-10-CM

## 2021-10-19 DIAGNOSIS — E1169 Type 2 diabetes mellitus with other specified complication: Secondary | ICD-10-CM

## 2021-10-19 DIAGNOSIS — E119 Type 2 diabetes mellitus without complications: Secondary | ICD-10-CM | POA: Insufficient documentation

## 2021-10-19 MED ORDER — RYBELSUS 7 MG PO TABS: 7.0000 mg | ORAL_TABLET | Freq: Every day | ORAL | 0 refills | Status: DC

## 2021-10-27 ENCOUNTER — Telehealth: Payer: Self-pay | Admitting: Internal Medicine

## 2021-10-27 ENCOUNTER — Other Ambulatory Visit: Payer: Self-pay | Admitting: Internal Medicine

## 2021-10-27 ENCOUNTER — Encounter: Payer: Self-pay | Admitting: Internal Medicine

## 2021-10-27 NOTE — Telephone Encounter (Signed)
Pt is requesting a decrease in dosage on Semaglutide (RYBELSUS) 7 MG TABS. He would like it decreased back to 3MG . He said the change in dosage to 7MG  made him vomit and left him feeling nauseous.   Please advise.

## 2021-10-27 NOTE — Telephone Encounter (Signed)
Message left for patient with Dr. Yetta Barre recommendations

## 2021-11-05 IMAGING — DX DG CHEST 1V PORT
1 series · 1 of 1 positions shown · non-contrast
Comparison: August 02, 2020

CLINICAL DATA: Shortness of breath

EXAM:
PORTABLE CHEST 1 VIEW

[chest ap]
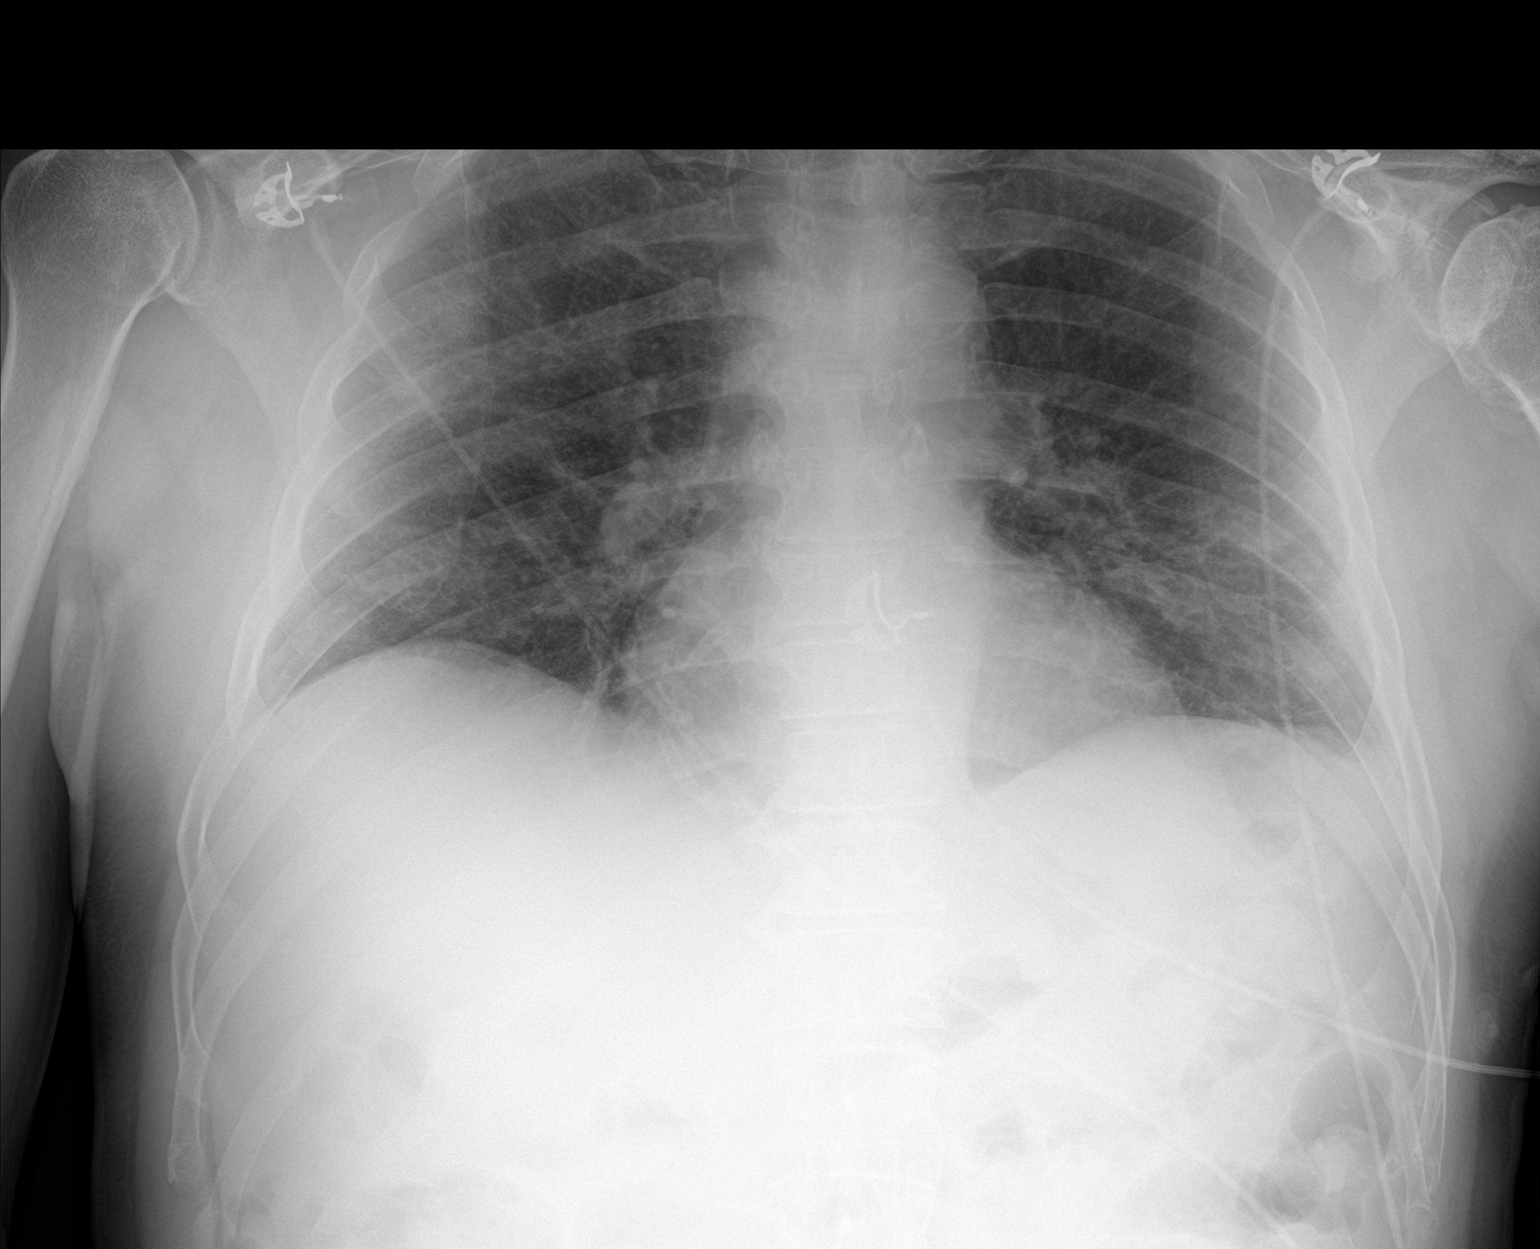

[1 of 1 positions shown; findings below may reference images not displayed]

FINDINGS: Ill-defined airspace opacity persists in the right upper lobe. More
subtle opacity is noted in each lung bases. No consolidation. Heart
size and pulmonary vascularity are normal. No adenopathy. There is
aortic atherosclerosis. No bone lesions.
IMPRESSION: Ill-defined areas of airspace opacity, most notable in the right
upper lobe. Question atypical organism pneumonia. No consolidation.
Stable cardiac silhouette. Aortic Atherosclerosis (QLGFI-D73.3).

## 2021-12-29 ENCOUNTER — Telehealth: Payer: Self-pay

## 2021-12-29 NOTE — Telephone Encounter (Signed)
Approved 11/29/2021 - 12/29/2022

## 2021-12-29 NOTE — Telephone Encounter (Signed)
Key: [id number]

## 2022-02-13 LAB — HM DIABETES EYE EXAM

## 2022-04-25 ENCOUNTER — Encounter: Payer: Self-pay | Admitting: Internal Medicine

## 2022-04-25 ENCOUNTER — Ambulatory Visit (INDEPENDENT_AMBULATORY_CARE_PROVIDER_SITE_OTHER): Payer: 59 | Admitting: Internal Medicine

## 2022-04-25 VITALS — BP 148/84 | HR 82 | Temp 98.5°F | Resp 16 | Ht 67.0 in | Wt 173.0 lb

## 2022-04-25 DIAGNOSIS — I2510 Atherosclerotic heart disease of native coronary artery without angina pectoris: Secondary | ICD-10-CM | POA: Diagnosis not present

## 2022-04-25 DIAGNOSIS — E1169 Type 2 diabetes mellitus with other specified complication: Secondary | ICD-10-CM | POA: Diagnosis not present

## 2022-04-25 DIAGNOSIS — N401 Enlarged prostate with lower urinary tract symptoms: Secondary | ICD-10-CM | POA: Diagnosis not present

## 2022-04-25 DIAGNOSIS — Z0001 Encounter for general adult medical examination with abnormal findings: Secondary | ICD-10-CM | POA: Diagnosis not present

## 2022-04-25 DIAGNOSIS — E1151 Type 2 diabetes mellitus with diabetic peripheral angiopathy without gangrene: Secondary | ICD-10-CM

## 2022-04-25 DIAGNOSIS — E781 Pure hyperglyceridemia: Secondary | ICD-10-CM

## 2022-04-25 DIAGNOSIS — E119 Type 2 diabetes mellitus without complications: Secondary | ICD-10-CM

## 2022-04-25 DIAGNOSIS — N138 Other obstructive and reflux uropathy: Secondary | ICD-10-CM

## 2022-04-25 DIAGNOSIS — I70209 Unspecified atherosclerosis of native arteries of extremities, unspecified extremity: Secondary | ICD-10-CM

## 2022-04-25 DIAGNOSIS — I5189 Other ill-defined heart diseases: Secondary | ICD-10-CM | POA: Diagnosis not present

## 2022-04-25 DIAGNOSIS — I10 Essential (primary) hypertension: Secondary | ICD-10-CM

## 2022-04-25 DIAGNOSIS — Z794 Long term (current) use of insulin: Secondary | ICD-10-CM

## 2022-04-25 LAB — CBC WITH DIFFERENTIAL/PLATELET
Basophils Absolute: 0.1 K/uL (ref 0.0–0.1)
Basophils Relative: 1.2 % (ref 0.0–3.0)
Eosinophils Absolute: 0.2 K/uL (ref 0.0–0.7)
Eosinophils Relative: 2.8 % (ref 0.0–5.0)
HCT: 43.1 % (ref 39.0–52.0)
Hemoglobin: 14.7 g/dL (ref 13.0–17.0)
Lymphocytes Relative: 29.4 % (ref 12.0–46.0)
Lymphs Abs: 2 K/uL (ref 0.7–4.0)
MCHC: 34 g/dL (ref 30.0–36.0)
MCV: 84.3 fl (ref 78.0–100.0)
Monocytes Absolute: 0.6 K/uL (ref 0.1–1.0)
Monocytes Relative: 9.3 % (ref 3.0–12.0)
Neutro Abs: 3.8 K/uL (ref 1.4–7.7)
Neutrophils Relative %: 57.3 % (ref 43.0–77.0)
Platelets: 220 K/uL (ref 150.0–400.0)
RBC: 5.12 Mil/uL (ref 4.22–5.81)
RDW: 13.3 % (ref 11.5–15.5)
WBC: 6.7 K/uL (ref 4.0–10.5)

## 2022-04-25 LAB — BRAIN NATRIURETIC PEPTIDE: Pro B Natriuretic peptide (BNP): 18 pg/mL (ref 0.0–100.0)

## 2022-04-25 LAB — MICROALBUMIN / CREATININE URINE RATIO
Creatinine,U: 62.7 mg/dL
Microalb Creat Ratio: 2.3 mg/g (ref 0.0–30.0)
Microalb, Ur: 1.4 mg/dL (ref 0.0–1.9)

## 2022-04-25 MED ORDER — ICOSAPENT ETHYL 1 G PO CAPS: ORAL_CAPSULE | ORAL | 1 refills | Status: AC

## 2022-04-25 MED ORDER — FREESTYLE LIBRE 2 READER DEVI: 1.0000 | Freq: Every day | 5 refills | Status: DC

## 2022-04-25 MED ORDER — ATORVASTATIN CALCIUM 80 MG PO TABS: 80.0000 mg | ORAL_TABLET | Freq: Every day | ORAL | 1 refills | Status: DC

## 2022-04-25 MED ORDER — FREESTYLE LIBRE 2 SENSOR MISC: 5 refills | Status: DC

## 2022-04-25 MED ORDER — DAPAGLIFLOZIN PROPANEDIOL 10 MG PO TABS: 10.0000 mg | ORAL_TABLET | Freq: Every day | ORAL | 1 refills | Status: DC

## 2022-04-25 NOTE — Patient Instructions (Signed)
Health Maintenance, Male Adopting a healthy lifestyle and getting preventive care are important in promoting health and wellness. Ask your health care provider about: The right schedule for you to have regular tests and exams. Things you can do on your own to prevent diseases and keep yourself healthy. What should I know about diet, weight, and exercise? Eat a healthy diet  Eat a diet that includes plenty of vegetables, fruits, low-fat dairy products, and lean protein. Do not eat a lot of foods that are high in solid fats, added sugars, or sodium. Maintain a healthy weight Body mass index (BMI) is a measurement that can be used to identify possible weight problems. It estimates body fat based on height and weight. Your health care provider can help determine your BMI and help you achieve or maintain a healthy weight. Get regular exercise Get regular exercise. This is one of the most important things you can do for your health. Most adults should: Exercise for at least 150 minutes each week. The exercise should increase your heart rate and make you sweat (moderate-intensity exercise). Do strengthening exercises at least twice a week. This is in addition to the moderate-intensity exercise. Spend less time sitting. Even light physical activity can be beneficial. Watch cholesterol and blood lipids Have your blood tested for lipids and cholesterol at 64 years of age, then have this test every 5 years. You may need to have your cholesterol levels checked more often if: Your lipid or cholesterol levels are high. You are older than 64 years of age. You are at high risk for heart disease. What should I know about cancer screening? Many types of cancers can be detected early and may often be prevented. Depending on your health history and family history, you may need to have cancer screening at various ages. This may include screening for: Colorectal cancer. Prostate cancer. Skin cancer. Lung  cancer. What should I know about heart disease, diabetes, and high blood pressure? Blood pressure and heart disease High blood pressure causes heart disease and increases the risk of stroke. This is more likely to develop in people who have high blood pressure readings or are overweight. Talk with your health care provider about your target blood pressure readings. Have your blood pressure checked: Every 3-5 years if you are 18-39 years of age. Every year if you are 40 years old or older. If you are between the ages of 65 and 75 and are a current or former smoker, ask your health care provider if you should have a one-time screening for abdominal aortic aneurysm (AAA). Diabetes Have regular diabetes screenings. This checks your fasting blood sugar level. Have the screening done: Once every three years after age 45 if you are at a normal weight and have a low risk for diabetes. More often and at a younger age if you are overweight or have a high risk for diabetes. What should I know about preventing infection? Hepatitis B If you have a higher risk for hepatitis B, you should be screened for this virus. Talk with your health care provider to find out if you are at risk for hepatitis B infection. Hepatitis C Blood testing is recommended for: Everyone born from 1945 through 1965. Anyone with known risk factors for hepatitis C. Sexually transmitted infections (STIs) You should be screened each year for STIs, including gonorrhea and chlamydia, if: You are sexually active and are younger than 64 years of age. You are older than 64 years of age and your   health care provider tells you that you are at risk for this type of infection. Your sexual activity has changed since you were last screened, and you are at increased risk for chlamydia or gonorrhea. Ask your health care provider if you are at risk. Ask your health care provider about whether you are at high risk for HIV. Your health care provider  may recommend a prescription medicine to help prevent HIV infection. If you choose to take medicine to prevent HIV, you should first get tested for HIV. You should then be tested every 3 months for as long as you are taking the medicine. Follow these instructions at home: Alcohol use Do not drink alcohol if your health care provider tells you not to drink. If you drink alcohol: Limit how much you have to 0-2 drinks a day. Know how much alcohol is in your drink. In the [place] one drink equals one 12 oz bottle of beer (355 mL), one 5 oz glass of wine (148 mL), or one 1 oz glass of hard liquor (44 mL). Lifestyle Do not use any products that contain nicotine or tobacco. These products include cigarettes, chewing tobacco, and vaping devices, such as e-cigarettes. If you need help quitting, ask your health care provider. Do not use street drugs. Do not share needles. Ask your health care provider for help if you need support or information about quitting drugs. General instructions Schedule regular health, dental, and eye exams. Stay current with your vaccines. Tell your health care provider if: You often feel depressed. You have ever been abused or do not feel safe at home. Summary Adopting a healthy lifestyle and getting preventive care are important in promoting health and wellness. Follow your health care provider's instructions about healthy diet, exercising, and getting tested or screened for diseases. Follow your health care provider's instructions on monitoring your cholesterol and blood pressure. This information is not intended to replace advice given to you by your health care provider. Make sure you discuss any questions you have with your health care provider. Document Revised: 08/29/2020 Document Reviewed: 08/29/2020 Elsevier Patient Education  2023 [employer]  

## 2022-04-25 NOTE — Progress Notes (Unsigned)
Subjective:  Patient ID: Justin Galvan, male    DOB: Jul 19, 1960  Age: 62 y.o. MRN: [id number]  CC: Annual Exam, Hypertension, Coronary Artery Disease, Diabetes, and Hyperlipidemia   HPI Justin Galvan presents for a CPX and f/up -   He stopped taking Rybelsus because of nausea and vomiting.  He now complains of weight gain.  He is active and denies chest pain, shortness of breath, diaphoresis, or edema.  Outpatient Medications Prior to Visit  Medication Sig Dispense Refill   ASPIRIN LOW DOSE 81 MG EC tablet TAKE 1 TABLET BY MOUTH EVERY DAY 90 tablet 1   indapamide (LOZOL) 1.25 MG tablet Take 1 tablet (1.25 mg total) by mouth daily. 90 tablet 3   irbesartan (AVAPRO) 300 MG tablet Take 1 tablet (300 mg total) by mouth daily. 90 tablet 3   metFORMIN (GLUCOPHAGE-XR) 750 MG 24 hr tablet Take 2 tablets (1,500 mg total) by mouth daily with breakfast. 180 tablet 3   PARoxetine (PAXIL) 40 MG tablet Take 1 tablet (40 mg total) by mouth daily. 90 tablet 3   tadalafil (CIALIS) 20 MG tablet Take 1 tablet (20 mg total) by mouth daily as needed for erectile dysfunction. 8 tablet 0   terazosin (HYTRIN) 2 MG capsule TAKE 1 CAPSULE BY MOUTH EVERYDAY AT BEDTIME 90 capsule 3   atorvastatin (LIPITOR) 80 MG tablet Take 1 tablet (80 mg total) by mouth daily. at 6pm 90 tablet 3   Continuous Blood Gluc Receiver (FREESTYLE LIBRE 2 READER) DEVI 1 Act by Does not apply route daily. 2 each 5   Continuous Blood Gluc Sensor (FREESTYLE LIBRE 2 SENSOR) MISC APPLY 1 SENSOR AS DIRECTED EVERY 14 DAYS BY DOES NOT APPLY ROUTE DAILY. 2 each 5   dapagliflozin propanediol (FARXIGA) 10 MG TABS tablet Take 1 tablet (10 mg total) by mouth daily. 90 tablet 3   glucose blood test strip Test up to two times daily 100 each 12   icosapent Ethyl (VASCEPA) 1 g capsule TAKE 2 CAPSULES BY MOUTH 2 TIMES DAILY. 360 capsule 3   Insulin Pen Needle 32G X 6 MM MISC 1 Act by Does not apply route daily. 100 each 1   nitroGLYCERIN (NITROSTAT)  0.4 MG SL tablet Place 1 tablet (0.4 mg total) under the tongue every 5 (five) minutes as needed for chest pain. 25 tablet 6   No facility-administered medications prior to visit.    ROS Review of Systems  Constitutional:  Positive for unexpected weight change (wt gain). Negative for chills, diaphoresis and fatigue.  HENT: Negative.  Negative for sore throat and trouble swallowing.   Respiratory: Negative.  Negative for cough, chest tightness, shortness of breath and wheezing.   Cardiovascular:  Negative for chest pain, palpitations and leg swelling.  Gastrointestinal:  Negative for abdominal pain, diarrhea, nausea and vomiting.  Endocrine: Negative.   Genitourinary: Negative.  Negative for difficulty urinating and dysuria.  Musculoskeletal: Negative.   Skin: Negative.  Negative for color change, pallor and rash.  Neurological: Negative.  Negative for dizziness and weakness.  Hematological:  Negative for adenopathy. Does not bruise/bleed easily.  Psychiatric/Behavioral: Negative.      Objective:  BP (!) 148/84 (BP Location: Left Arm, Patient Position: Sitting, Cuff Size: Large)   Pulse 82   Temp 98.5 F (36.9 C) (Oral)   Resp 16   Ht 5\' 7"  (1.702 m)   Wt 173 lb (78.5 kg)   SpO2 96%   BMI 27.10 kg/m   BP Readings from  Last 3 Encounters:  04/25/22 (!) 148/84  09/13/21 126/76  04/18/21 (!) 182/80    Wt Readings from Last 3 Encounters:  04/25/22 173 lb (78.5 kg)  09/13/21 165 lb (74.8 kg)  04/18/21 174 lb (78.9 kg)    Physical Exam Vitals reviewed. Exam conducted with a chaperone present.  HENT:     Mouth/Throat:     Mouth: Mucous membranes are moist.  Eyes:     General: No scleral icterus.    Conjunctiva/sclera: Conjunctivae normal.  Cardiovascular:     Rate and Rhythm: Normal rate and regular rhythm. Occasional Extrasystoles are present.    Heart sounds: No murmur heard.    No gallop.     Comments: EKG- SR with occasional PVC, 75 bpm Incomplete RBBB Old  infract pattern Minimal change Pulmonary:     Effort: Pulmonary effort is normal.     Breath sounds: No stridor. No wheezing, rhonchi or rales.  Abdominal:     General: Abdomen is flat.     Palpations: There is no mass.     Tenderness: There is no abdominal tenderness. There is no guarding or rebound.     Hernia: No hernia is present. There is no hernia in the left inguinal area or right inguinal area.  Genitourinary:    Pubic Area: No rash.      Penis: Normal and circumcised.      Testes: Normal.     Epididymis:     Right: Normal.     Left: Normal.     Prostate: Enlarged. Not tender and no nodules present.     Rectum: Normal. Guaiac result negative. No mass, tenderness, anal fissure, external hemorrhoid or internal hemorrhoid. Normal anal tone.  Musculoskeletal:        General: Normal range of motion.     Cervical back: Neck supple.     Right lower leg: No edema.     Left lower leg: No edema.  Lymphadenopathy:     Cervical: No cervical adenopathy.     Lower Body: No right inguinal adenopathy. No left inguinal adenopathy.  Skin:    General: Skin is warm and dry.     Findings: No rash.  Neurological:     General: No focal deficit present.     Mental Status: He is alert.  Psychiatric:        Mood and Affect: Mood normal.        Behavior: Behavior normal.     Lab Results  Component Value Date   WBC 6.7 04/25/2022   HGB 14.7 04/25/2022   HCT 43.1 04/25/2022   PLT 220.0 04/25/2022   GLUCOSE 147 (H) 04/25/2022   CHOL 140 09/13/2021   TRIG 181.0 (H) 04/25/2022   HDL 35.80 (L) 09/13/2021   LDLDIRECT 138.0 04/18/2021   LDLCALC 71 09/13/2021   ALT 19 04/25/2022   AST 17 04/25/2022   NA 139 04/25/2022   K 3.8 04/25/2022   CL 100 04/25/2022   CREATININE 0.89 04/25/2022   BUN 19 04/25/2022   CO2 31 04/25/2022   TSH 2.95 04/18/2021   PSA 1.93 04/25/2022   INR 0.9 08/15/2020   HGBA1C 8.2 (H) 04/25/2022   MICROALBUR 1.4 04/25/2022    ECHOCARDIOGRAM COMPLETE  Result  Date: 08/03/2020    ECHOCARDIOGRAM REPORT   Patient Name:   Justin Galvan Date of Exam: 08/03/2020 Medical Rec #:  [id number]        Height:       67.0 in Accession #:  [id number]       Weight:       174.0 lb Date of Birth:  02/15/1961         BSA:          1.906 m Patient Age:    60 years         BP:           116/54 mmHg Patient Gender: M                HR:           54 bpm. Exam Location:  Inpatient Procedure: 2D Echo, Cardiac Doppler and Color Doppler Indications:    Acutre respiratory distress R06.03  History:        Patient has prior history of Echocardiogram examinations, most                 recent 04/23/2015. Previous Myocardial Infarction and CAD; Risk                 Factors:Hypertension, Diabetes and Dyslipidemia.  Sonographer:    Eulah PontSarah Pirrotta RDCS Referring Phys: Sharla [id number] Werner LeanSHANKER M [facility] IMPRESSIONS  1. Left ventricular ejection fraction, by estimation, is 50 to 55%. The left ventricle has low normal function. The left ventricle demonstrates regional wall motion abnormalities (see scoring diagram/findings for description). The basal-to-mid inferior wall is hypokinetic. Left ventricular diastolic parameters are consistent with Grade I diastolic dysfunction (impaired relaxation).  2. Right ventricular systolic function is normal. The right ventricular size is normal. Tricuspid regurgitation signal is inadequate for assessing PA pressure.  3. The mitral valve is normal in structure. Trivial mitral valve regurgitation. No evidence of mitral stenosis.  4. The aortic valve is tricuspid. There is mild calcification of the aortic valve. There is mild thickening of the aortic valve. Aortic valve regurgitation is not visualized. Mild aortic valve sclerosis is present, with no evidence of aortic valve stenosis.  5. The inferior vena cava is normal in size with <50% respiratory variability, suggesting right atrial pressure of 8 mmHg. Comparison(s): Compared to prior TTE in 2016, the LVEF is now slightly improved  to 50-55%. There continues to be basal-to-mid inferior wall hypokinesis. FINDINGS  Left Ventricle: Left ventricular ejection fraction, by estimation, is 50 to 55%. The left ventricle has low normal function. The left ventricle demonstrates regional wall motion abnormalities. The basal-to-mid inferior wall is hypokinetic. The left ventricular internal cavity size was normal in size. There is no left ventricular hypertrophy. Left ventricular diastolic parameters are consistent with Grade I diastolic dysfunction (impaired relaxation). Right Ventricle: The right ventricular size is normal. No increase in right ventricular wall thickness. Right ventricular systolic function is normal. Tricuspid regurgitation signal is inadequate for assessing PA pressure. Left Atrium: Left atrial size was normal in size. Right Atrium: Right atrial size was normal in size. Pericardium: There is no evidence of pericardial effusion. Mitral Valve: The mitral valve is normal in structure. There is mild thickening of the mitral valve leaflet(s). Trivial mitral valve regurgitation. No evidence of mitral valve stenosis. Tricuspid Valve: The tricuspid valve is normal in structure. Tricuspid valve regurgitation is trivial. Aortic Valve: The aortic valve is tricuspid. There is mild calcification of the aortic valve. There is mild thickening of the aortic valve. Aortic valve regurgitation is not visualized. Mild aortic valve sclerosis is present, with no evidence of aortic valve stenosis. Pulmonic Valve: The pulmonic valve was normal in structure. Pulmonic valve regurgitation is not visualized. Aorta: The aortic root and ascending  aorta are structurally normal, with no evidence of dilitation. Venous: The inferior vena cava is normal in size with less than 50% respiratory variability, suggesting right atrial pressure of 8 mmHg. IAS/Shunts: No atrial level shunt detected by color flow Doppler.  LEFT VENTRICLE PLAX 2D LVIDd:         5.30 cm  Diastology  LVIDs:         3.80 cm  LV e' medial:    6.87 cm/s LV PW:         0.90 cm  LV E/e' medial:  10.1 LV IVS:        0.90 cm  LV e' lateral:   13.10 cm/s LVOT diam:     2.10 cm  LV E/e' lateral: 5.3 LV SV:         94 LV SV Index:   49 LVOT Area:     3.46 cm  RIGHT VENTRICLE RV S prime:     9.90 cm/s TAPSE (M-mode): 1.9 cm LEFT ATRIUM             Index       RIGHT ATRIUM           Index LA diam:        3.90 cm 2.05 cm/m  RA Area:     13.20 cm LA Vol (A2C):   59.2 ml 31.06 ml/m RA Volume:   33.20 ml  17.42 ml/m LA Vol (A4C):   60.4 ml 31.69 ml/m LA Biplane Vol: 60.7 ml 31.85 ml/m  AORTIC VALVE LVOT Vmax:   120.00 cm/s LVOT Vmean:  82.000 cm/s LVOT VTI:    0.270 m  AORTA Ao Root diam: 3.10 cm Ao Asc diam:  3.00 cm MITRAL VALVE MV Area (PHT): 3.12 cm    SHUNTS MV Decel Time: 243 msec    Systemic VTI:  0.27 m MV E velocity: 69.60 cm/s  Systemic Diam: 2.10 cm MV A velocity: 68.60 cm/s MV E/A ratio:  1.01 Gwyndolyn Kaufman MD Electronically signed by Gwyndolyn Kaufman MD Signature Date/Time: 08/03/2020/11:33:35 AM    Final    DG Chest Port 1 View  Result Date: 08/03/2020 CLINICAL DATA:  Shortness of breath EXAM: PORTABLE CHEST 1 VIEW COMPARISON:  August 02, 2020 FINDINGS: Ill-defined airspace opacity persists in the right upper lobe. More subtle opacity is noted in each lung bases. No consolidation. Heart size and pulmonary vascularity are normal. No adenopathy. There is aortic atherosclerosis. No bone lesions. IMPRESSION: Ill-defined areas of airspace opacity, most notable in the right upper lobe. Question atypical organism pneumonia. No consolidation. Stable cardiac silhouette. Aortic Atherosclerosis (ICD10-I70.0). Electronically Signed   By: Lowella Grip III M.D.   On: 08/03/2020 08:10   DG Chest Port 1 View  Result Date: 08/02/2020 CLINICAL DATA:  Dyspnea. EXAM: PORTABLE CHEST 1 VIEW COMPARISON:  April 22, 2015. FINDINGS: The heart size and mediastinal contours are within normal limits. No pneumothorax  or pleural effusion is noted. Multiple patchy airspace opacities are noted throughout both lungs concerning for multifocal pneumonia. The visualized skeletal structures are unremarkable. IMPRESSION: Multiple patchy airspace opacities are noted bilaterally concerning for multifocal pneumonia. Electronically Signed   By: Marijo Conception M.D.   On: 08/02/2020 14:41    Assessment & Plan:   Shayden was seen today for annual exam, hypertension, coronary artery disease, diabetes and hyperlipidemia.  Diagnoses and all orders for this visit:  Essential hypertension- His blood pressure is adequately well-controlled. 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  Will start basal insulin. -     Hemoglobin A1c; Future -     Basic metabolic panel; Future -     Microalbumin / creatinine urine ratio; Future -     dapagliflozin propanediol (FARXIGA) 10 MG TABS tablet; Take 1 tablet (10 mg total) by mouth daily. -     HM Diabetes Foot Exam -     Continuous Blood Gluc Sensor (FREESTYLE LIBRE 2 SENSOR) MISC; APPLY 1 SENSOR AS DIRECTED EVERY 14 DAYS BY DOES NOT APPLY ROUTE DAILY. -     Continuous Blood Gluc Receiver (FREESTYLE LIBRE 2 READER) DEVI; 1 Act by Does not apply route daily. -     Microalbumin / creatinine urine ratio -     Basic metabolic panel -     Hemoglobin A1c -     Insulin Pen Needle 32G X 6 MM MISC; 1 Act by Does not apply route daily.  Hypertriglyceridemia -     Hepatic function panel; Future -     Triglycerides; Future -     icosapent Ethyl (VASCEPA) 1 g capsule; TAKE 2 CAPSULES BY MOUTH 2 TIMES DAILY. -      Triglycerides -     Hepatic function panel  BPH with obstruction/lower urinary tract symptoms -     Urinalysis, Routine w reflex microscopic; Future -     Urinalysis, Routine w reflex microscopic  Type 2 diabetes mellitus with other specified complication, without long-term current use of insulin (HCC) -     Hemoglobin A1c; Future -     Basic metabolic panel; Future -     Microalbumin / creatinine urine ratio; Future -     Microalbumin / creatinine urine ratio -     Basic metabolic panel -     Hemoglobin A1c -     insulin glargine, 2 Unit Dial, (TOUJEO MAX SOLOSTAR) 300 UNIT/ML Solostar Pen; Inject 20 Units into the skin daily.  Encounter for well adult exam with abnormal findings- Exam completed, labs reviewed, he refused some vaccines today, cancer screenings are up-to-date, patient education was given. -     PSA; Future -     PSA  Atherosclerosis of native coronary artery of native heart without angina pectoris- He is not experiencing angina.  Enzymes are normal.  Will continue to address risk factor modifications. -     Troponin I (High Sensitivity); Future -     Brain natriuretic peptide; Future -     atorvastatin (LIPITOR) 80 MG tablet; Take 1 tablet (80 mg total) by mouth daily. at 6pm -     icosapent Ethyl (VASCEPA) 1 g capsule; TAKE 2 CAPSULES BY MOUTH 2 TIMES DAILY. -     Brain natriuretic peptide -     Troponin I (High Sensitivity)  Grade I diastolic dysfunction- Will treat with an SGLT2 inhibitor. -     dapagliflozin propanediol (FARXIGA) 10 MG TABS tablet; Take 1 tablet (10 mg total) by mouth daily. -     Troponin I (High Sensitivity); Future -  Brain natriuretic peptide; Future -     Brain natriuretic peptide -     Troponin I (High Sensitivity)  Insulin-requiring or dependent type II diabetes mellitus (HCC) -     insulin glargine, 2 Unit Dial, (TOUJEO MAX SOLOSTAR) 300 UNIT/ML Solostar Pen; Inject 20 Units into the skin daily. -     Insulin Pen Needle 32G X 6 MM  MISC; 1 Act by Does not apply route daily.   I have discontinued Merrit Friesen. Cherubini's glucose blood. I am also having him start on [employer]. Additionally, I am having him maintain his tadalafil, nitroGLYCERIN, terazosin, PARoxetine, metFORMIN, irbesartan, indapamide, Aspirin Low Dose, dapagliflozin propanediol, FreeStyle Libre 2 Sensor, [employer] 2 Reader, atorvastatin, icosapent Ethyl, and Insulin Pen Needle.  Meds ordered this encounter  Medications   dapagliflozin propanediol (FARXIGA) 10 MG TABS tablet    Sig: Take 1 tablet (10 mg total) by mouth daily.    Dispense:  90 tablet    Refill:  1   Continuous Blood Gluc Sensor (FREESTYLE LIBRE 2 SENSOR) MISC    Sig: APPLY 1 SENSOR AS DIRECTED EVERY 14 DAYS BY DOES NOT APPLY ROUTE DAILY.    Dispense:  2 each    Refill:  5    DX Code Needed  .   Continuous Blood Gluc Receiver (FREESTYLE LIBRE 2 READER) DEVI    Sig: 1 Act by Does not apply route daily.    Dispense:  2 each    Refill:  5   atorvastatin (LIPITOR) 80 MG tablet    Sig: Take 1 tablet (80 mg total) by mouth daily. at 6pm    Dispense:  90 tablet    Refill:  1   icosapent Ethyl (VASCEPA) 1 g capsule    Sig: TAKE 2 CAPSULES BY MOUTH 2 TIMES DAILY.    Dispense:  360 capsule    Refill:  1   insulin glargine, 2 Unit Dial, (TOUJEO MAX SOLOSTAR) 300 UNIT/ML Solostar Pen    Sig: Inject 20 Units into the skin daily.    Dispense:  6 mL    Refill:  1   Insulin Pen Needle 32G X 6 MM MISC    Sig: 1 Act by Does not apply route daily.    Dispense:  100 each    Refill:  1     Follow-up: Return in about 6 months (around 10/24/2022).  Sanda Linger, MD

## 2022-04-26 DIAGNOSIS — E119 Type 2 diabetes mellitus without complications: Secondary | ICD-10-CM | POA: Insufficient documentation

## 2022-04-26 LAB — HEMOGLOBIN A1C: Hgb A1c MFr Bld: 8.2 % — ABNORMAL HIGH (ref 4.6–6.5)

## 2022-04-26 LAB — URINALYSIS, ROUTINE W REFLEX MICROSCOPIC
Bilirubin Urine: NEGATIVE
Hgb urine dipstick: NEGATIVE
Ketones, ur: NEGATIVE
Leukocytes,Ua: NEGATIVE
Nitrite: NEGATIVE
RBC / HPF: NONE SEEN (ref 0–?)
Specific Gravity, Urine: 1.02 (ref 1.000–1.030)
Total Protein, Urine: NEGATIVE
Urine Glucose: >=1000 — AB
Urobilinogen, UA: 0.2 (ref 0.0–1.0)
WBC, UA: NONE SEEN (ref 0–?)
pH: 5.5 (ref 5.0–8.0)

## 2022-04-26 LAB — HEPATIC FUNCTION PANEL
ALT: 19 U/L (ref 0–53)
AST: 17 U/L (ref 0–37)
Albumin: 4.7 g/dL (ref 3.5–5.2)
Alkaline Phosphatase: 60 U/L (ref 39–117)
Bilirubin, Direct: 0.1 mg/dL (ref 0.0–0.3)
Total Bilirubin: 0.5 mg/dL (ref 0.2–1.2)
Total Protein: 6.7 g/dL (ref 6.0–8.3)

## 2022-04-26 LAB — BASIC METABOLIC PANEL
BUN: 19 mg/dL (ref 6–23)
CO2: 31 mEq/L (ref 19–32)
Calcium: 9.6 mg/dL (ref 8.4–10.5)
Chloride: 100 mEq/L (ref 96–112)
Creatinine, Ser: 0.89 mg/dL (ref 0.40–1.50)
GFR: 90.95 mL/min (ref >60.00)
Glucose, Bld: 147 mg/dL — ABNORMAL HIGH (ref 70–99)
Potassium: 3.8 mEq/L (ref 3.5–5.1)
Sodium: 139 mEq/L (ref 135–145)

## 2022-04-26 LAB — TROPONIN I (HIGH SENSITIVITY): High Sens Troponin I: 7 ng/L (ref 2–17)

## 2022-04-26 LAB — TRIGLYCERIDES: Triglycerides: 181 mg/dL — ABNORMAL HIGH (ref 0.0–149.0)

## 2022-04-26 LAB — PSA: PSA: 1.93 ng/mL (ref 0.10–4.00)

## 2022-04-26 MED ORDER — INSULIN PEN NEEDLE 32G X 6 MM MISC: 1.0000 | Freq: Every day | 1 refills | Status: DC

## 2022-04-26 MED ORDER — TOUJEO MAX SOLOSTAR 300 UNIT/ML ~~LOC~~ SOPN: 20.0000 [IU] | PEN_INJECTOR | Freq: Every day | SUBCUTANEOUS | 1 refills | Status: DC

## 2022-04-27 ENCOUNTER — Other Ambulatory Visit: Payer: Self-pay | Admitting: Internal Medicine

## 2022-04-27 DIAGNOSIS — E1169 Type 2 diabetes mellitus with other specified complication: Secondary | ICD-10-CM

## 2022-04-27 DIAGNOSIS — E119 Type 2 diabetes mellitus without complications: Secondary | ICD-10-CM

## 2022-04-27 MED ORDER — TOUJEO MAX SOLOSTAR 300 UNIT/ML ~~LOC~~ SOPN: 20.0000 [IU] | PEN_INJECTOR | Freq: Every day | SUBCUTANEOUS | 1 refills | Status: DC

## 2022-04-27 NOTE — Telephone Encounter (Signed)
Patient called and said that Dr. Ronnald Ramp called in the Pen needles for him on the 4th, but did not call in any insulin  Please send the insulin to [facility] in [place], [place]  Patient's number:  [phone]

## 2022-05-06 ENCOUNTER — Other Ambulatory Visit: Payer: Self-pay | Admitting: Internal Medicine

## 2022-05-06 DIAGNOSIS — I10 Essential (primary) hypertension: Secondary | ICD-10-CM

## 2022-05-06 DIAGNOSIS — I5189 Other ill-defined heart diseases: Secondary | ICD-10-CM

## 2022-05-06 DIAGNOSIS — N401 Enlarged prostate with lower urinary tract symptoms: Secondary | ICD-10-CM

## 2022-05-06 DIAGNOSIS — F418 Other specified anxiety disorders: Secondary | ICD-10-CM

## 2022-05-06 DIAGNOSIS — I2510 Atherosclerotic heart disease of native coronary artery without angina pectoris: Secondary | ICD-10-CM

## 2022-05-06 DIAGNOSIS — E1151 Type 2 diabetes mellitus with diabetic peripheral angiopathy without gangrene: Secondary | ICD-10-CM

## 2022-05-12 ENCOUNTER — Other Ambulatory Visit: Payer: Self-pay | Admitting: Internal Medicine

## 2022-05-12 DIAGNOSIS — I10 Essential (primary) hypertension: Secondary | ICD-10-CM

## 2022-05-12 NOTE — Telephone Encounter (Signed)
Ok to pcp please °

## 2022-05-13 ENCOUNTER — Other Ambulatory Visit: Payer: Self-pay | Admitting: Internal Medicine

## 2022-05-13 DIAGNOSIS — I10 Essential (primary) hypertension: Secondary | ICD-10-CM

## 2022-05-13 MED ORDER — INDAPAMIDE 1.25 MG PO TABS: 1.2500 mg | ORAL_TABLET | Freq: Every day | ORAL | 5 refills | Status: DC

## 2022-05-15 ENCOUNTER — Other Ambulatory Visit: Payer: Self-pay | Admitting: Internal Medicine

## 2022-05-15 DIAGNOSIS — I2510 Atherosclerotic heart disease of native coronary artery without angina pectoris: Secondary | ICD-10-CM

## 2022-05-15 DIAGNOSIS — E781 Pure hyperglyceridemia: Secondary | ICD-10-CM

## 2022-05-15 NOTE — Telephone Encounter (Signed)
Please refill as per office routine med refill policy (all routine meds to be refilled for 3 mo or monthly (per pt preference) up to one year from last visit, then month to month grace period for 3 mo, then further med refills will have to be denied)

## 2022-09-05 ENCOUNTER — Other Ambulatory Visit: Payer: Self-pay | Admitting: Internal Medicine

## 2022-09-05 DIAGNOSIS — F418 Other specified anxiety disorders: Secondary | ICD-10-CM

## 2022-10-29 ENCOUNTER — Ambulatory Visit: Payer: 59 | Admitting: Internal Medicine

## 2022-11-09 ENCOUNTER — Other Ambulatory Visit: Payer: Self-pay | Admitting: Internal Medicine

## 2022-11-09 DIAGNOSIS — E781 Pure hyperglyceridemia: Secondary | ICD-10-CM

## 2022-11-09 DIAGNOSIS — I10 Essential (primary) hypertension: Secondary | ICD-10-CM

## 2022-11-09 DIAGNOSIS — N401 Enlarged prostate with lower urinary tract symptoms: Secondary | ICD-10-CM

## 2022-11-09 DIAGNOSIS — I2510 Atherosclerotic heart disease of native coronary artery without angina pectoris: Secondary | ICD-10-CM

## 2022-11-16 ENCOUNTER — Other Ambulatory Visit: Payer: Self-pay | Admitting: Internal Medicine

## 2022-11-16 DIAGNOSIS — I2510 Atherosclerotic heart disease of native coronary artery without angina pectoris: Secondary | ICD-10-CM

## 2022-11-16 DIAGNOSIS — E1151 Type 2 diabetes mellitus with diabetic peripheral angiopathy without gangrene: Secondary | ICD-10-CM

## 2022-11-16 DIAGNOSIS — I10 Essential (primary) hypertension: Secondary | ICD-10-CM

## 2022-11-19 ENCOUNTER — Other Ambulatory Visit: Payer: Self-pay | Admitting: Internal Medicine

## 2022-11-19 DIAGNOSIS — I10 Essential (primary) hypertension: Secondary | ICD-10-CM

## 2022-11-21 ENCOUNTER — Encounter: Payer: Self-pay | Admitting: Internal Medicine

## 2022-11-21 ENCOUNTER — Ambulatory Visit (INDEPENDENT_AMBULATORY_CARE_PROVIDER_SITE_OTHER): Payer: 59 | Admitting: Internal Medicine

## 2022-11-21 VITALS — BP 172/88 | HR 73 | Temp 98.6°F | Resp 16 | Ht 67.0 in | Wt 176.0 lb

## 2022-11-21 DIAGNOSIS — E785 Hyperlipidemia, unspecified: Secondary | ICD-10-CM | POA: Diagnosis not present

## 2022-11-21 DIAGNOSIS — E1169 Type 2 diabetes mellitus with other specified complication: Secondary | ICD-10-CM | POA: Diagnosis not present

## 2022-11-21 DIAGNOSIS — I10 Essential (primary) hypertension: Secondary | ICD-10-CM | POA: Diagnosis not present

## 2022-11-21 DIAGNOSIS — F418 Other specified anxiety disorders: Secondary | ICD-10-CM

## 2022-11-21 DIAGNOSIS — E119 Type 2 diabetes mellitus without complications: Secondary | ICD-10-CM

## 2022-11-21 DIAGNOSIS — E1151 Type 2 diabetes mellitus with diabetic peripheral angiopathy without gangrene: Secondary | ICD-10-CM

## 2022-11-21 DIAGNOSIS — I2510 Atherosclerotic heart disease of native coronary artery without angina pectoris: Secondary | ICD-10-CM

## 2022-11-21 DIAGNOSIS — I70209 Unspecified atherosclerosis of native arteries of extremities, unspecified extremity: Secondary | ICD-10-CM

## 2022-11-21 DIAGNOSIS — Z794 Long term (current) use of insulin: Secondary | ICD-10-CM

## 2022-11-21 DIAGNOSIS — N138 Other obstructive and reflux uropathy: Secondary | ICD-10-CM

## 2022-11-21 DIAGNOSIS — N401 Enlarged prostate with lower urinary tract symptoms: Secondary | ICD-10-CM

## 2022-11-21 LAB — LIPID PANEL
Cholesterol: 143 mg/dL (ref 0–200)
HDL: 42.5 mg/dL (ref >39.00)
LDL Cholesterol: 70 mg/dL (ref 0–99)
NonHDL: 100.8
Total CHOL/HDL Ratio: 3
Triglycerides: 155 mg/dL — ABNORMAL HIGH (ref 0.0–149.0)
VLDL: 31 mg/dL (ref 0.0–40.0)

## 2022-11-21 LAB — BASIC METABOLIC PANEL
BUN: 14 mg/dL (ref 6–23)
CO2: 27 mEq/L (ref 19–32)
Calcium: 9.5 mg/dL (ref 8.4–10.5)
Chloride: 103 mEq/L (ref 96–112)
Creatinine, Ser: 0.85 mg/dL (ref 0.40–1.50)
GFR: 91.85 mL/min (ref >60.00)
Glucose, Bld: 186 mg/dL — ABNORMAL HIGH (ref 70–99)
Potassium: 4 mEq/L (ref 3.5–5.1)
Sodium: 139 mEq/L (ref 135–145)

## 2022-11-21 LAB — URINALYSIS, ROUTINE W REFLEX MICROSCOPIC
Bilirubin Urine: NEGATIVE
Hgb urine dipstick: NEGATIVE
Ketones, ur: NEGATIVE
Leukocytes,Ua: NEGATIVE
Nitrite: NEGATIVE
Specific Gravity, Urine: 1.025 (ref 1.000–1.030)
Total Protein, Urine: NEGATIVE
Urine Glucose: >=1000 — AB
Urobilinogen, UA: 0.2 (ref 0.0–1.0)
pH: 6 (ref 5.0–8.0)

## 2022-11-21 LAB — MICROALBUMIN / CREATININE URINE RATIO
Creatinine,U: 74.7 mg/dL
Microalb Creat Ratio: 2.5 mg/g (ref 0.0–30.0)
Microalb, Ur: 1.9 mg/dL (ref 0.0–1.9)

## 2022-11-21 LAB — HEMOGLOBIN A1C: Hgb A1c MFr Bld: 7.4 % — ABNORMAL HIGH (ref 4.6–6.5)

## 2022-11-21 LAB — BRAIN NATRIURETIC PEPTIDE: Pro B Natriuretic peptide (BNP): 48 pg/mL (ref 0.0–100.0)

## 2022-11-21 LAB — TROPONIN I (HIGH SENSITIVITY): High Sens Troponin I: 9 ng/L (ref 2–17)

## 2022-11-21 MED ORDER — IRBESARTAN 300 MG PO TABS
300.0000 mg | ORAL_TABLET | Freq: Every day | ORAL | 1 refills | Status: DC
Start: 2022-11-21 — End: 2023-05-25

## 2022-11-21 MED ORDER — TOUJEO MAX SOLOSTAR 300 UNIT/ML ~~LOC~~ SOPN
20.0000 [IU] | PEN_INJECTOR | Freq: Every day | SUBCUTANEOUS | 1 refills | Status: DC
Start: 2022-11-21 — End: 2023-07-01

## 2022-11-21 MED ORDER — METFORMIN HCL ER 750 MG PO TB24
750.0000 mg | ORAL_TABLET | Freq: Every day | ORAL | 1 refills | Status: DC
Start: 2022-11-21 — End: 2023-09-23

## 2022-11-21 MED ORDER — ATORVASTATIN CALCIUM 80 MG PO TABS: 80.0000 mg | ORAL_TABLET | Freq: Every day | ORAL | 1 refills | Status: DC

## 2022-11-21 MED ORDER — TERAZOSIN HCL 2 MG PO CAPS: ORAL_CAPSULE | ORAL | 1 refills | Status: DC

## 2022-11-21 MED ORDER — PAROXETINE HCL 40 MG PO TABS
40.0000 mg | ORAL_TABLET | Freq: Every day | ORAL | 1 refills | Status: DC
Start: 2022-11-21 — End: 2023-05-25

## 2022-11-21 MED ORDER — INDAPAMIDE 1.25 MG PO TABS
1.2500 mg | ORAL_TABLET | Freq: Every day | ORAL | 5 refills | Status: DC
Start: 2022-11-21 — End: 2023-05-25

## 2022-11-21 MED ORDER — INSULIN PEN NEEDLE 32G X 6 MM MISC
1.0000 | Freq: Every day | 1 refills | Status: DC
Start: 2022-11-21 — End: 2023-09-23

## 2022-11-21 MED ORDER — ASPIRIN 81 MG PO TBEC
81.0000 mg | DELAYED_RELEASE_TABLET | Freq: Every day | ORAL | 1 refills | Status: DC
Start: 2022-11-21 — End: 2023-05-25

## 2022-11-21 NOTE — Patient Instructions (Signed)
Hypertension, Adult High blood pressure (hypertension) is when the force of blood pumping through the arteries is too strong. The arteries are the blood vessels that carry blood from the heart throughout the body. Hypertension forces the heart to work harder to pump blood and may cause arteries to become narrow or stiff. Untreated or uncontrolled hypertension can lead to a heart attack, heart failure, a stroke, kidney disease, and other problems. A blood pressure reading consists of a higher number over a lower number. Ideally, your blood pressure should be below 120/80. The first ("top") number is called the systolic pressure. It is a measure of the pressure in your arteries as your heart beats. The second ("bottom") number is called the diastolic pressure. It is a measure of the pressure in your arteries as the heart relaxes. What are the causes? The exact cause of this condition is not known. There are some conditions that result in high blood pressure. What increases the risk? Certain factors may make you more likely to develop high blood pressure. Some of these risk factors are under your control, including: Smoking. Not getting enough exercise or physical activity. Being overweight. Having too much fat, sugar, calories, or salt (sodium) in your diet. Drinking too much alcohol. Other risk factors include: Having a personal history of heart disease, diabetes, high cholesterol, or kidney disease. Stress. Having a family history of high blood pressure and high cholesterol. Having obstructive sleep apnea. Age. The risk increases with age. What are the signs or symptoms? High blood pressure may not cause symptoms. Very high blood pressure (hypertensive crisis) may cause: Headache. Fast or irregular heartbeats (palpitations). Shortness of breath. Nosebleed. Nausea and vomiting. Vision changes. Severe chest pain, dizziness, and seizures. How is this diagnosed? This condition is diagnosed by  measuring your blood pressure while you are seated, with your arm resting on a flat surface, your legs uncrossed, and your feet flat on the floor. The cuff of the blood pressure monitor will be placed directly against the skin of your upper arm at the level of your heart. Blood pressure should be measured at least twice using the same arm. Certain conditions can cause a difference in blood pressure between your right and left arms. If you have a high blood pressure reading during one visit or you have normal blood pressure with other risk factors, you may be asked to: Return on a different day to have your blood pressure checked again. Monitor your blood pressure at home for 1 week or longer. If you are diagnosed with hypertension, you may have other blood or imaging tests to help your health care provider understand your overall risk for other conditions. How is this treated? This condition is treated by making healthy lifestyle changes, such as eating healthy foods, exercising more, and reducing your alcohol intake. You may be referred for counseling on a healthy diet and physical activity. Your health care provider may prescribe medicine if lifestyle changes are not enough to get your blood pressure under control and if: Your systolic blood pressure is above 130. Your diastolic blood pressure is above 80. Your personal target blood pressure may vary depending on your medical conditions, your age, and other factors. Follow these instructions at home: Eating and drinking  Eat a diet that is high in fiber and potassium, and low in sodium, added sugar, and fat. An example of this eating plan is called the DASH diet. DASH stands for Dietary Approaches to Stop Hypertension. To eat this way: Eat   plenty of fresh fruits and vegetables. Try to fill one half of your plate at each meal with fruits and vegetables. Eat whole grains, such as whole-wheat pasta, brown rice, or whole-grain bread. Fill about one  fourth of your plate with whole grains. Eat or drink low-fat dairy products, such as skim milk or low-fat yogurt. Avoid fatty cuts of meat, processed or cured meats, and poultry with skin. Fill about one fourth of your plate with lean proteins, such as fish, chicken without skin, beans, eggs, or tofu. Avoid pre-made and processed foods. These tend to be higher in sodium, added sugar, and fat. Reduce your daily sodium intake. Many people with hypertension should eat less than 1,500 mg of sodium a day. Do not drink alcohol if: Your health care provider tells you not to drink. You are pregnant, may be pregnant, or are planning to become pregnant. If you drink alcohol: Limit how much you have to: 0-1 drink a day for women. 0-2 drinks a day for men. Know how much alcohol is in your drink. In the [place] one drink equals one 12 oz bottle of beer (355 mL), one 5 oz glass of wine (148 mL), or one 1 oz glass of hard liquor (44 mL). Lifestyle  Work with your health care provider to maintain a healthy body weight or to lose weight. Ask what an ideal weight is for you. Get at least 30 minutes of exercise that causes your heart to beat faster (aerobic exercise) most days of the week. Activities may include walking, swimming, or biking. Include exercise to strengthen your muscles (resistance exercise), such as Pilates or lifting weights, as part of your weekly exercise routine. Try to do these types of exercises for 30 minutes at least 3 days a week. Do not use any products that contain nicotine or tobacco. These products include cigarettes, chewing tobacco, and vaping devices, such as e-cigarettes. If you need help quitting, ask your health care provider. Monitor your blood pressure at home as told by your health care provider. Keep all follow-up visits. This is important. Medicines Take over-the-counter and prescription medicines only as told by your health care provider. Follow directions carefully. Blood  pressure medicines must be taken as prescribed. Do not skip doses of blood pressure medicine. Doing this puts you at risk for problems and can make the medicine less effective. Ask your health care provider about side effects or reactions to medicines that you should watch for. Contact a health care provider if you: Think you are having a reaction to a medicine you are taking. Have headaches that keep coming back (recurring). Feel dizzy. Have swelling in your ankles. Have trouble with your vision. Get help right away if you: Develop a severe headache or confusion. Have unusual weakness or numbness. Feel faint. Have severe pain in your chest or abdomen. Vomit repeatedly. Have trouble breathing. These symptoms may be an emergency. Get help right away. Call 911. Do not wait to see if the symptoms will go away. Do not drive yourself to the hospital. Summary Hypertension is when the force of blood pumping through your arteries is too strong. If this condition is not controlled, it may put you at risk for serious complications. Your personal target blood pressure may vary depending on your medical conditions, your age, and other factors. For most people, a normal blood pressure is less than 120/80. Hypertension is treated with lifestyle changes, medicines, or a combination of both. Lifestyle changes include losing weight, eating a healthy,   low-sodium diet, exercising more, and limiting alcohol. This information is not intended to replace advice given to you by your health care provider. Make sure you discuss any questions you have with your health care provider. Document Revised: 02/14/2021 Document Reviewed: 02/14/2021 Elsevier Patient Education  2024 [employer] Inc.  

## 2022-11-21 NOTE — Progress Notes (Signed)
Subjective:  Patient ID: Justin Galvan, male    DOB: 1960/08/16  Age: 62 y.o. MRN: [id number]  CC: Hypertension, Hyperlipidemia, Diabetes, and Coronary Artery Disease   HPI SRIYAN TERP presents for f/up -----  Discussed the use of AI scribe software for clinical note transcription with the patient, who gave verbal consent to proceed.  History of Present Illness   The patient, with a history of hypertension, diabetes, and cardiovascular disease, presents with minimal chest pain and shortness of breath. The discomfort is described as a transient 'burning' sensation in the neck, not the chest, with no associated dysphagia. They also report occasional headaches.  The patient has been off all medications, including Paxil, indapamide, Farxiga, irbesartan, metformin, Hytrin, insulin, and atorvastatin, for the past two to three days due to running out.       Outpatient Medications Prior to Visit  Medication Sig Dispense Refill   Continuous Blood Gluc Receiver (FREESTYLE LIBRE 2 READER) DEVI 1 Act by Does not apply route daily. 2 each 5   Continuous Blood Gluc Sensor (FREESTYLE LIBRE 2 SENSOR) MISC APPLY 1 SENSOR AS DIRECTED EVERY 14 DAYS BY DOES NOT APPLY ROUTE DAILY. 2 each 5   FARXIGA 10 MG TABS tablet TAKE 1 TABLET BY MOUTH EVERY DAY 90 tablet 1   icosapent Ethyl (VASCEPA) 1 g capsule TAKE 2 CAPSULES BY MOUTH 2 TIMES DAILY. 360 capsule 1   tadalafil (CIALIS) 20 MG tablet Take 1 tablet (20 mg total) by mouth daily as needed for erectile dysfunction. 8 tablet 0   ASPIRIN LOW DOSE 81 MG EC tablet TAKE 1 TABLET BY MOUTH EVERY DAY 90 tablet 1   atorvastatin (LIPITOR) 80 MG tablet Take 1 tablet (80 mg total) by mouth daily. at 6pm 90 tablet 1   indapamide (LOZOL) 1.25 MG tablet Take 1 tablet (1.25 mg total) by mouth daily. 30 tablet 5   insulin glargine, 2 Unit Dial, (TOUJEO MAX SOLOSTAR) 300 UNIT/ML Solostar Pen Inject 20 Units into the skin daily. 6 mL 1   Insulin Pen Needle 32G X 6 MM  MISC 1 Act by Does not apply route daily. 100 each 1   irbesartan (AVAPRO) 300 MG tablet TAKE 1 TABLET BY MOUTH EVERY DAY 90 tablet 1   metFORMIN (GLUCOPHAGE-XR) 750 MG 24 hr tablet TAKE 2 TABLETS (1,500 MG TOTAL) BY MOUTH EVERY DAY WITH BREAKFAST 180 tablet 1   PARoxetine (PAXIL) 40 MG tablet TAKE 1 TABLET BY MOUTH EVERY DAY 90 tablet 0   terazosin (HYTRIN) 2 MG capsule TAKE 1 CAPSULE BY MOUTH EVERYDAY AT BEDTIME 90 capsule 1   nitroGLYCERIN (NITROSTAT) 0.4 MG SL tablet Place 1 tablet (0.4 mg total) under the tongue every 5 (five) minutes as needed for chest pain. 25 tablet 6   No facility-administered medications prior to visit.    ROS Review of Systems  Constitutional: Negative.  Negative for appetite change, diaphoresis, fatigue and unexpected weight change.  HENT: Negative.    Eyes: Negative.   Respiratory:  Negative for cough, chest tightness, shortness of breath and wheezing.   Cardiovascular:  Negative for chest pain, palpitations and leg swelling.  Gastrointestinal:  Negative for abdominal pain, constipation, diarrhea, nausea and vomiting.  Genitourinary:  Negative for difficulty urinating and dysuria.  Musculoskeletal: Negative.  Negative for arthralgias and myalgias.  Skin: Negative.   Neurological:  Positive for headaches. Negative for dizziness and weakness.  Hematological:  Negative for adenopathy. Does not bruise/bleed easily.  Psychiatric/Behavioral: Negative.  Objective:  BP (!) 172/88 (BP Location: Left Arm, Patient Position: Sitting, Cuff Size: Large)   Pulse 73   Temp 98.6 F (37 C) (Oral)   Resp 16   Ht 5\' 7"  (1.702 m)   Wt 176 lb (79.8 kg)   SpO2 94%   BMI 27.57 kg/m   BP Readings from Last 3 Encounters:  11/21/22 (!) 172/88  04/25/22 (!) 148/84  09/13/21 126/76    Wt Readings from Last 3 Encounters:  11/21/22 176 lb (79.8 kg)  04/25/22 173 lb (78.5 kg)  09/13/21 165 lb (74.8 kg)    Physical Exam Vitals reviewed.  Constitutional:       Appearance: Normal appearance.  HENT:     Nose: Nose normal.     Mouth/Throat:     Mouth: Mucous membranes are moist.  Eyes:     General: No scleral icterus.    Conjunctiva/sclera: Conjunctivae normal.  Cardiovascular:     Rate and Rhythm: Normal rate and regular rhythm.     Heart sounds: Normal heart sounds, S1 normal and S2 normal. Heart sounds not distant. No murmur heard.    No gallop.     Comments: EKG- NSR, 65 bpm Q wave in III is larger Otherwise unchanged inferior/lateral infarct patterns No LVH Pulmonary:     Effort: Pulmonary effort is normal.     Breath sounds: No stridor. No wheezing, rhonchi or rales.  Abdominal:     General: Abdomen is flat.     Palpations: There is no mass.     Tenderness: There is no abdominal tenderness. There is no guarding or rebound.     Hernia: No hernia is present.  Musculoskeletal:     Cervical back: Neck supple.     Right lower leg: No edema.     Left lower leg: No edema.  Lymphadenopathy:     Cervical: No cervical adenopathy.  Neurological:     General: No focal deficit present.     Mental Status: He is alert. Mental status is at baseline.     Lab Results  Component Value Date   WBC 6.7 04/25/2022   HGB 14.7 04/25/2022   HCT 43.1 04/25/2022   PLT 220.0 04/25/2022   GLUCOSE 186 (H) 11/21/2022   CHOL 143 11/21/2022   TRIG 155.0 (H) 11/21/2022   HDL 42.50 11/21/2022   LDLDIRECT 138.0 04/18/2021   LDLCALC 70 11/21/2022   ALT 19 04/25/2022   AST 17 04/25/2022   NA 139 11/21/2022   K 4.0 11/21/2022   CL 103 11/21/2022   CREATININE 0.85 11/21/2022   BUN 14 11/21/2022   CO2 27 11/21/2022   TSH 2.95 04/18/2021   PSA 1.93 04/25/2022   INR 0.9 08/15/2020   HGBA1C 7.4 (H) 11/21/2022   MICROALBUR 1.9 11/21/2022    ECHOCARDIOGRAM COMPLETE  Result Date: 08/03/2020    ECHOCARDIOGRAM REPORT   Patient Name:   Justin Galvan Date of Exam: 08/03/2020 Medical Rec #:  [id number]        Height:       67.0 in Accession #:     [id number]       Weight:       174.0 lb Date of Birth:  [birth year]         BSA:          1.906 m Patient Age:    60 years         BP:           116/54 mmHg  Patient Gender: M                HR:           54 bpm. Exam Location:  Inpatient Procedure: 2D Echo, Cardiac Doppler and Color Doppler Indications:    Acutre respiratory distress R06.03  History:        Patient has prior history of Echocardiogram examinations, most                 recent 04/23/2015. Previous Myocardial Infarction and CAD; Risk                 Factors:Hypertension, Diabetes and Dyslipidemia.  Sonographer:    Eulah Pont RDCS Referring Phys: Sharla Kidney Werner Lean [facility] IMPRESSIONS  1. Left ventricular ejection fraction, by estimation, is 50 to 55%. The left ventricle has low normal function. The left ventricle demonstrates regional wall motion abnormalities (see scoring diagram/findings for description). The basal-to-mid inferior wall is hypokinetic. Left ventricular diastolic parameters are consistent with Grade I diastolic dysfunction (impaired relaxation).  2. Right ventricular systolic function is normal. The right ventricular size is normal. Tricuspid regurgitation signal is inadequate for assessing PA pressure.  3. The mitral valve is normal in structure. Trivial mitral valve regurgitation. No evidence of mitral stenosis.  4. The aortic valve is tricuspid. There is mild calcification of the aortic valve. There is mild thickening of the aortic valve. Aortic valve regurgitation is not visualized. Mild aortic valve sclerosis is present, with no evidence of aortic valve stenosis.  5. The inferior vena cava is normal in size with <50% respiratory variability, suggesting right atrial pressure of 8 mmHg. Comparison(s): Compared to prior TTE in 2016, the LVEF is now slightly improved to 50-55%. There continues to be basal-to-mid inferior wall hypokinesis. FINDINGS  Left Ventricle: Left ventricular ejection fraction, by estimation, is 50 to 55%. The left  ventricle has low normal function. The left ventricle demonstrates regional wall motion abnormalities. The basal-to-mid inferior wall is hypokinetic. The left ventricular internal cavity size was normal in size. There is no left ventricular hypertrophy. Left ventricular diastolic parameters are consistent with Grade I diastolic dysfunction (impaired relaxation). Right Ventricle: The right ventricular size is normal. No increase in right ventricular wall thickness. Right ventricular systolic function is normal. Tricuspid regurgitation signal is inadequate for assessing PA pressure. Left Atrium: Left atrial size was normal in size. Right Atrium: Right atrial size was normal in size. Pericardium: There is no evidence of pericardial effusion. Mitral Valve: The mitral valve is normal in structure. There is mild thickening of the mitral valve leaflet(s). Trivial mitral valve regurgitation. No evidence of mitral valve stenosis. Tricuspid Valve: The tricuspid valve is normal in structure. Tricuspid valve regurgitation is trivial. Aortic Valve: The aortic valve is tricuspid. There is mild calcification of the aortic valve. There is mild thickening of the aortic valve. Aortic valve regurgitation is not visualized. Mild aortic valve sclerosis is present, with no evidence of aortic valve stenosis. Pulmonic Valve: The pulmonic valve was normal in structure. Pulmonic valve regurgitation is not visualized. Aorta: The aortic root and ascending aorta are structurally normal, with no evidence of dilitation. Venous: The inferior vena cava is normal in size with less than 50% respiratory variability, suggesting right atrial pressure of 8 mmHg. IAS/Shunts: No atrial level shunt detected by color flow Doppler.  LEFT VENTRICLE PLAX 2D LVIDd:         5.30 cm  Diastology LVIDs:         3.80  cm  LV e' medial:    6.87 cm/s LV PW:         0.90 cm  LV E/e' medial:  10.1 LV IVS:        0.90 cm  LV e' lateral:   13.10 cm/s LVOT diam:     2.10 cm   LV E/e' lateral: 5.3 LV SV:         94 LV SV Index:   49 LVOT Area:     3.46 cm  RIGHT VENTRICLE RV S prime:     9.90 cm/s TAPSE (M-mode): 1.9 cm LEFT ATRIUM             Index       RIGHT ATRIUM           Index LA diam:        3.90 cm 2.05 cm/m  RA Area:     13.20 cm LA Vol (A2C):   59.2 ml 31.06 ml/m RA Volume:   33.20 ml  17.42 ml/m LA Vol (A4C):   60.4 ml 31.69 ml/m LA Biplane Vol: 60.7 ml 31.85 ml/m  AORTIC VALVE LVOT Vmax:   120.00 cm/s LVOT Vmean:  82.000 cm/s LVOT VTI:    0.270 m  AORTA Ao Root diam: 3.10 cm Ao Asc diam:  3.00 cm MITRAL VALVE MV Area (PHT): 3.12 cm    SHUNTS MV Decel Time: 243 msec    Systemic VTI:  0.27 m MV E velocity: 69.60 cm/s  Systemic Diam: 2.10 cm MV A velocity: 68.60 cm/s MV E/A ratio:  1.01 Laurance Flatten MD Electronically signed by Laurance Flatten MD Signature Date/Time: 08/03/2020/11:33:35 AM    Final    DG Chest Port 1 View  Result Date: 08/03/2020 CLINICAL DATA:  Shortness of breath EXAM: PORTABLE CHEST 1 VIEW COMPARISON:  August 02, 2020 FINDINGS: Ill-defined airspace opacity persists in the right upper lobe. More subtle opacity is noted in each lung bases. No consolidation. Heart size and pulmonary vascularity are normal. No adenopathy. There is aortic atherosclerosis. No bone lesions. IMPRESSION: Ill-defined areas of airspace opacity, most notable in the right upper lobe. Question atypical organism pneumonia. No consolidation. Stable cardiac silhouette. Aortic Atherosclerosis (ICD10-I70.0). Electronically Signed   By: Bretta Bang III M.D.   On: 08/03/2020 08:10   DG Chest Port 1 View  Result Date: 08/02/2020 CLINICAL DATA:  Dyspnea. EXAM: PORTABLE CHEST 1 VIEW COMPARISON:  April 22, 2015. FINDINGS: The heart size and mediastinal contours are within normal limits. No pneumothorax or pleural effusion is noted. Multiple patchy airspace opacities are noted throughout both lungs concerning for multifocal pneumonia. The visualized skeletal structures are  unremarkable. IMPRESSION: Multiple patchy airspace opacities are noted bilaterally concerning for multifocal pneumonia. Electronically Signed   By: Lupita Raider M.D.   On: 08/02/2020 14:41    Assessment & Plan:   Hyperlipidemia with target LDL less than 70 - LDL goal achieved. Doing well on the statin  -     Lipid panel; Future -     Atorvastatin Calcium; Take 1 tablet (80 mg total) by mouth daily. at 6pm  Dispense: 90 tablet; Refill: 1  Essential hypertension- Will try to get better control of his blood pressure.  -     Basic metabolic panel; Future -     Urinalysis, Routine w reflex microscopic; Future -     Brain natriuretic peptide; Future -     EKG 12-Lead -     Indapamide; Take 1 tablet (1.25 mg  total) by mouth daily.  Dispense: 30 tablet; Refill: 5 -     Irbesartan; Take 1 tablet (300 mg total) by mouth daily.  Dispense: 90 tablet; Refill: 1 -     Terazosin HCl; TAKE 1 CAPSULE BY MOUTH EVERYDAY AT BEDTIME  Dispense: 90 capsule; Refill: 1  Type 2 diabetes mellitus with other specified complication, without long-term current use of insulin (HCC)-his A1c has improved. -     Basic metabolic panel; Future -     Hemoglobin A1c; Future -     Microalbumin / creatinine urine ratio; Future -     Urinalysis, Routine w reflex microscopic; Future -     Toujeo Max SoloStar; Inject 20 Units into the skin daily.  Dispense: 6 mL; Refill: 1  Atherosclerosis of native coronary artery of native heart without angina pectoris- I do not think his throat discomfort is angina.  His EKG shows an insignificant change in lead III.  Enzymes are normal. -     Lipid panel; Future -     Troponin I (High Sensitivity); Future -     Brain natriuretic peptide; Future -     Aspirin; Take 1 tablet (81 mg total) by mouth daily. Swallow whole.  Dispense: 90 tablet; Refill: 1 -     Atorvastatin Calcium; Take 1 tablet (80 mg total) by mouth daily. at 6pm  Dispense: 90 tablet; Refill: 1 -     Irbesartan; Take 1 tablet  (300 mg total) by mouth daily.  Dispense: 90 tablet; Refill: 1  Insulin-requiring or dependent type II diabetes mellitus (HCC) -     Toujeo Max SoloStar; Inject 20 Units into the skin daily.  Dispense: 6 mL; Refill: 1 -     Insulin Pen Needle; 1 Act by Does not apply route daily.  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  Dispense: 90 tablet; Refill: 1 -     metFORMIN HCl ER; Take 1 tablet (750 mg total) by mouth daily with breakfast.  Dispense: 180 tablet; Refill: 1  Depression with anxiety -     PARoxetine HCl; Take 1 tablet (40 mg total) by mouth daily.  Dispense: 90 tablet; Refill: 1  BPH with obstruction/lower urinary tract symptoms -     Terazosin HCl; TAKE 1 CAPSULE BY MOUTH EVERYDAY AT BEDTIME  Dispense: 90 capsule; Refill: 1     Follow-up: Return in about 3 months (around 02/21/2023).  Sanda Linger, MD

## 2022-12-06 ENCOUNTER — Telehealth: Payer: Self-pay

## 2022-12-06 ENCOUNTER — Other Ambulatory Visit (HOSPITAL_COMMUNITY): Payer: Self-pay

## 2022-12-06 NOTE — Telephone Encounter (Signed)
Pharmacy Patient Advocate Encounter   Received notification from CoverMyMeds that prior authorization for Vascepa 1GM capsules is required/requested.   Insurance verification completed.   The patient is insured through [employer] .   Per test claim: APPROVED from 11/06/22 to 12/06/23     Sponsored Renewals

## 2023-02-21 ENCOUNTER — Ambulatory Visit: Payer: 59 | Admitting: Internal Medicine

## 2023-02-21 ENCOUNTER — Encounter: Payer: Self-pay | Admitting: Internal Medicine

## 2023-02-21 VITALS — BP 172/84 | HR 63 | Temp 97.5°F | Resp 16 | Ht 67.0 in | Wt 173.8 lb

## 2023-02-21 DIAGNOSIS — I5189 Other ill-defined heart diseases: Secondary | ICD-10-CM

## 2023-02-21 DIAGNOSIS — E1151 Type 2 diabetes mellitus with diabetic peripheral angiopathy without gangrene: Secondary | ICD-10-CM | POA: Diagnosis not present

## 2023-02-21 DIAGNOSIS — I10 Essential (primary) hypertension: Secondary | ICD-10-CM

## 2023-02-21 DIAGNOSIS — I70209 Unspecified atherosclerosis of native arteries of extremities, unspecified extremity: Secondary | ICD-10-CM

## 2023-02-21 LAB — CBC WITH DIFFERENTIAL/PLATELET
Basophils Absolute: 0.1 10*3/uL (ref 0.0–0.1)
Basophils Relative: 1 % (ref 0.0–3.0)
Eosinophils Absolute: 0.2 10*3/uL (ref 0.0–0.7)
Eosinophils Relative: 2.8 % (ref 0.0–5.0)
HCT: 44.7 % (ref 39.0–52.0)
Hemoglobin: 14.7 g/dL (ref 13.0–17.0)
Lymphocytes Relative: 26 % (ref 12.0–46.0)
Lymphs Abs: 1.7 10*3/uL (ref 0.7–4.0)
MCHC: 33 g/dL (ref 30.0–36.0)
MCV: 86 fL (ref 78.0–100.0)
Monocytes Absolute: 0.6 10*3/uL (ref 0.1–1.0)
Monocytes Relative: 9.1 % (ref 3.0–12.0)
Neutro Abs: 4 10*3/uL (ref 1.4–7.7)
Neutrophils Relative %: 61.1 % (ref 43.0–77.0)
Platelets: 237 10*3/uL (ref 150.0–400.0)
RBC: 5.2 Mil/uL (ref 4.22–5.81)
RDW: 14.2 % (ref 11.5–15.5)
WBC: 6.5 10*3/uL (ref 4.0–10.5)

## 2023-02-21 LAB — URINALYSIS, ROUTINE W REFLEX MICROSCOPIC
Bilirubin Urine: NEGATIVE
Hgb urine dipstick: NEGATIVE
Ketones, ur: NEGATIVE
Leukocytes,Ua: NEGATIVE
Nitrite: NEGATIVE
RBC / HPF: NONE SEEN (ref 0–?)
Specific Gravity, Urine: 1.015 (ref 1.000–1.030)
Total Protein, Urine: NEGATIVE
Urine Glucose: >=1000 — AB
Urobilinogen, UA: 0.2 (ref 0.0–1.0)
pH: 6.5 (ref 5.0–8.0)

## 2023-02-21 LAB — BASIC METABOLIC PANEL
BUN: 16 mg/dL (ref 6–23)
CO2: 30 meq/L (ref 19–32)
Calcium: 9.6 mg/dL (ref 8.4–10.5)
Chloride: 102 meq/L (ref 96–112)
Creatinine, Ser: 0.91 mg/dL (ref 0.40–1.50)
GFR: 88.94 mL/min (ref >60.00)
Glucose, Bld: 154 mg/dL — ABNORMAL HIGH (ref 70–99)
Potassium: 3.8 meq/L (ref 3.5–5.1)
Sodium: 141 meq/L (ref 135–145)

## 2023-02-21 LAB — HEMOGLOBIN A1C: Hgb A1c MFr Bld: 7.5 % — ABNORMAL HIGH (ref 4.6–6.5)

## 2023-02-21 NOTE — Patient Instructions (Signed)
Hypertension, Adult High blood pressure (hypertension) is when the force of blood pumping through the arteries is too strong. The arteries are the blood vessels that carry blood from the heart throughout the body. Hypertension forces the heart to work harder to pump blood and may cause arteries to become narrow or stiff. Untreated or uncontrolled hypertension can lead to a heart attack, heart failure, a stroke, kidney disease, and other problems. A blood pressure reading consists of a higher number over a lower number. Ideally, your blood pressure should be below 120/80. The first ("top") number is called the systolic pressure. It is a measure of the pressure in your arteries as your heart beats. The second ("bottom") number is called the diastolic pressure. It is a measure of the pressure in your arteries as the heart relaxes. What are the causes? The exact cause of this condition is not known. There are some conditions that result in high blood pressure. What increases the risk? Certain factors may make you more likely to develop high blood pressure. Some of these risk factors are under your control, including: Smoking. Not getting enough exercise or physical activity. Being overweight. Having too much fat, sugar, calories, or salt (sodium) in your diet. Drinking too much alcohol. Other risk factors include: Having a personal history of heart disease, diabetes, high cholesterol, or kidney disease. Stress. Having a family history of high blood pressure and high cholesterol. Having obstructive sleep apnea. Age. The risk increases with age. What are the signs or symptoms? High blood pressure may not cause symptoms. Very high blood pressure (hypertensive crisis) may cause: Headache. Fast or irregular heartbeats (palpitations). Shortness of breath. Nosebleed. Nausea and vomiting. Vision changes. Severe chest pain, dizziness, and seizures. How is this diagnosed? This condition is diagnosed by  measuring your blood pressure while you are seated, with your arm resting on a flat surface, your legs uncrossed, and your feet flat on the floor. The cuff of the blood pressure monitor will be placed directly against the skin of your upper arm at the level of your heart. Blood pressure should be measured at least twice using the same arm. Certain conditions can cause a difference in blood pressure between your right and left arms. If you have a high blood pressure reading during one visit or you have normal blood pressure with other risk factors, you may be asked to: Return on a different day to have your blood pressure checked again. Monitor your blood pressure at home for 1 week or longer. If you are diagnosed with hypertension, you may have other blood or imaging tests to help your health care provider understand your overall risk for other conditions. How is this treated? This condition is treated by making healthy lifestyle changes, such as eating healthy foods, exercising more, and reducing your alcohol intake. You may be referred for counseling on a healthy diet and physical activity. Your health care provider may prescribe medicine if lifestyle changes are not enough to get your blood pressure under control and if: Your systolic blood pressure is above 130. Your diastolic blood pressure is above 80. Your personal target blood pressure may vary depending on your medical conditions, your age, and other factors. Follow these instructions at home: Eating and drinking  Eat a diet that is high in fiber and potassium, and low in sodium, added sugar, and fat. An example of this eating plan is called the DASH diet. DASH stands for Dietary Approaches to Stop Hypertension. To eat this way: Eat   plenty of fresh fruits and vegetables. Try to fill one half of your plate at each meal with fruits and vegetables. Eat whole grains, such as whole-wheat pasta, brown rice, or whole-grain bread. Fill about one  fourth of your plate with whole grains. Eat or drink low-fat dairy products, such as skim milk or low-fat yogurt. Avoid fatty cuts of meat, processed or cured meats, and poultry with skin. Fill about one fourth of your plate with lean proteins, such as fish, chicken without skin, beans, eggs, or tofu. Avoid pre-made and processed foods. These tend to be higher in sodium, added sugar, and fat. Reduce your daily sodium intake. Many people with hypertension should eat less than 1,500 mg of sodium a day. Do not drink alcohol if: Your health care provider tells you not to drink. You are pregnant, may be pregnant, or are planning to become pregnant. If you drink alcohol: Limit how much you have to: 0-1 drink a day for women. 0-2 drinks a day for men. Know how much alcohol is in your drink. In the [place] one drink equals one 12 oz bottle of beer (355 mL), one 5 oz glass of wine (148 mL), or one 1 oz glass of hard liquor (44 mL). Lifestyle  Work with your health care provider to maintain a healthy body weight or to lose weight. Ask what an ideal weight is for you. Get at least 30 minutes of exercise that causes your heart to beat faster (aerobic exercise) most days of the week. Activities may include walking, swimming, or biking. Include exercise to strengthen your muscles (resistance exercise), such as Pilates or lifting weights, as part of your weekly exercise routine. Try to do these types of exercises for 30 minutes at least 3 days a week. Do not use any products that contain nicotine or tobacco. These products include cigarettes, chewing tobacco, and vaping devices, such as e-cigarettes. If you need help quitting, ask your health care provider. Monitor your blood pressure at home as told by your health care provider. Keep all follow-up visits. This is important. Medicines Take over-the-counter and prescription medicines only as told by your health care provider. Follow directions carefully. Blood  pressure medicines must be taken as prescribed. Do not skip doses of blood pressure medicine. Doing this puts you at risk for problems and can make the medicine less effective. Ask your health care provider about side effects or reactions to medicines that you should watch for. Contact a health care provider if you: Think you are having a reaction to a medicine you are taking. Have headaches that keep coming back (recurring). Feel dizzy. Have swelling in your ankles. Have trouble with your vision. Get help right away if you: Develop a severe headache or confusion. Have unusual weakness or numbness. Feel faint. Have severe pain in your chest or abdomen. Vomit repeatedly. Have trouble breathing. These symptoms may be an emergency. Get help right away. Call 911. Do not wait to see if the symptoms will go away. Do not drive yourself to the hospital. Summary Hypertension is when the force of blood pumping through your arteries is too strong. If this condition is not controlled, it may put you at risk for serious complications. Your personal target blood pressure may vary depending on your medical conditions, your age, and other factors. For most people, a normal blood pressure is less than 120/80. Hypertension is treated with lifestyle changes, medicines, or a combination of both. Lifestyle changes include losing weight, eating a healthy,   low-sodium diet, exercising more, and limiting alcohol. This information is not intended to replace advice given to you by your health care provider. Make sure you discuss any questions you have with your health care provider. Document Revised: 02/14/2021 Document Reviewed: 02/14/2021 Elsevier Patient Education  2024 [employer] Inc.  

## 2023-02-21 NOTE — Progress Notes (Signed)
Subjective:  Patient ID: Justin Galvan, male    DOB: 09/25/1960  Age: 62 y.o. MRN: [id number]  CC: Hypertension and Diabetes   HPI Justin Galvan presents for f/up ---  Discussed the use of AI scribe software for clinical note transcription with the patient, who gave verbal consent to proceed.  History of Present Illness   The patient, with a history of hypertension, presents with intermittent headaches and blurred vision. He reports compliance with two of his three prescribed antihypertensive medications, as one of them reportedly causes hypotension. The specific medication causing this issue was not identified. He denies experiencing dizziness, lightheadedness, or edema.  The patient also denies chest pain, shortness of breath, coughing, or wheezing. He has not been monitoring his blood pressure at home. He has not received a flu vaccine and does not typically get one.  Regarding his blood glucose, he denies any symptoms related to blood sugar levels. His last eye exam was over a year ago, and he has been contacted for a follow-up but has not yet scheduled an appointment. Despite his elevated blood pressure, he expresses minimal concern.       Outpatient Medications Prior to Visit  Medication Sig Dispense Refill   aspirin EC (ASPIRIN LOW DOSE) 81 MG tablet Take 1 tablet (81 mg total) by mouth daily. Swallow whole. 90 tablet 1   atorvastatin (LIPITOR) 80 MG tablet Take 1 tablet (80 mg total) by mouth daily. at 6pm 90 tablet 1   Continuous Blood Gluc Receiver (FREESTYLE LIBRE 2 READER) DEVI 1 Act by Does not apply route daily. 2 each 5   Continuous Blood Gluc Sensor (FREESTYLE LIBRE 2 SENSOR) MISC APPLY 1 SENSOR AS DIRECTED EVERY 14 DAYS BY DOES NOT APPLY ROUTE DAILY. 2 each 5   FARXIGA 10 MG TABS tablet TAKE 1 TABLET BY MOUTH EVERY DAY 90 tablet 1   icosapent Ethyl (VASCEPA) 1 g capsule TAKE 2 CAPSULES BY MOUTH 2 TIMES DAILY. 360 capsule 1   indapamide (LOZOL) 1.25 MG tablet Take 1  tablet (1.25 mg total) by mouth daily. 30 tablet 5   insulin glargine, 2 Unit Dial, (TOUJEO MAX SOLOSTAR) 300 UNIT/ML Solostar Pen Inject 20 Units into the skin daily. 6 mL 1   Insulin Pen Needle 32G X 6 MM MISC 1 Act by Does not apply route daily. 100 each 1   irbesartan (AVAPRO) 300 MG tablet Take 1 tablet (300 mg total) by mouth daily. 90 tablet 1   metFORMIN (GLUCOPHAGE-XR) 750 MG 24 hr tablet Take 1 tablet (750 mg total) by mouth daily with breakfast. 180 tablet 1   PARoxetine (PAXIL) 40 MG tablet Take 1 tablet (40 mg total) by mouth daily. 90 tablet 1   tadalafil (CIALIS) 20 MG tablet Take 1 tablet (20 mg total) by mouth daily as needed for erectile dysfunction. 8 tablet 0   terazosin (HYTRIN) 2 MG capsule TAKE 1 CAPSULE BY MOUTH EVERYDAY AT BEDTIME 90 capsule 1   nitroGLYCERIN (NITROSTAT) 0.4 MG SL tablet Place 1 tablet (0.4 mg total) under the tongue every 5 (five) minutes as needed for chest pain. 25 tablet 6   No facility-administered medications prior to visit.    ROS Review of Systems  Constitutional:  Negative for chills, diaphoresis, fatigue and fever.  HENT: Negative.    Eyes:  Positive for visual disturbance. Negative for pain.  Respiratory:  Negative for cough, chest tightness, shortness of breath and wheezing.   Cardiovascular:  Negative for chest pain, palpitations  and leg swelling.  Gastrointestinal:  Negative for abdominal pain, diarrhea, nausea and vomiting.  Genitourinary: Negative.  Negative for difficulty urinating.  Musculoskeletal: Negative.  Negative for arthralgias, joint swelling, myalgias and neck pain.  Skin: Negative.  Negative for color change and pallor.  Neurological:  Positive for headaches. Negative for speech difficulty, weakness and light-headedness.  Hematological:  Negative for adenopathy. Does not bruise/bleed easily.  Psychiatric/Behavioral: Negative.      Objective:  BP (!) 172/84   Pulse 63   Temp (!) 97.5 F (36.4 C) (Oral)   Resp 16    Ht 5\' 7"  (1.702 m)   Wt 173 lb 12.8 oz (78.8 kg)   SpO2 96%   BMI 27.22 kg/m   BP Readings from Last 3 Encounters:  02/21/23 (!) 172/84  11/21/22 (!) 172/88  04/25/22 (!) 148/84    Wt Readings from Last 3 Encounters:  02/21/23 173 lb 12.8 oz (78.8 kg)  11/21/22 176 lb (79.8 kg)  04/25/22 173 lb (78.5 kg)    Physical Exam Vitals reviewed.  Constitutional:      Appearance: Normal appearance.  HENT:     Nose: Nose normal.     Mouth/Throat:     Mouth: Mucous membranes are moist.  Eyes:     General: No scleral icterus.    Extraocular Movements: Extraocular movements intact.     Conjunctiva/sclera: Conjunctivae normal.     Pupils: Pupils are equal, round, and reactive to light.  Cardiovascular:     Rate and Rhythm: Normal rate and regular rhythm.     Heart sounds: No murmur heard. Pulmonary:     Effort: Pulmonary effort is normal.     Breath sounds: No stridor. No wheezing, rhonchi or rales.  Abdominal:     General: Abdomen is flat.     Palpations: There is no mass.     Tenderness: There is no abdominal tenderness. There is no guarding.     Hernia: No hernia is present.  Musculoskeletal:        General: Normal range of motion.     Cervical back: Neck supple.     Right lower leg: No edema.     Left lower leg: No edema.  Lymphadenopathy:     Cervical: No cervical adenopathy.  Skin:    General: Skin is warm and dry.  Neurological:     General: No focal deficit present.     Mental Status: He is alert. Mental status is at baseline.  Psychiatric:        Mood and Affect: Mood normal.        Behavior: Behavior normal.     Lab Results  Component Value Date   WBC 6.5 02/21/2023   HGB 14.7 02/21/2023   HCT 44.7 02/21/2023   PLT 237.0 02/21/2023   GLUCOSE 154 (H) 02/21/2023   CHOL 143 11/21/2022   TRIG 155.0 (H) 11/21/2022   HDL 42.50 11/21/2022   LDLDIRECT 138.0 04/18/2021   LDLCALC 70 11/21/2022   ALT 19 04/25/2022   AST 17 04/25/2022   NA 141 02/21/2023   K  3.8 02/21/2023   CL 102 02/21/2023   CREATININE 0.91 02/21/2023   BUN 16 02/21/2023   CO2 30 02/21/2023   TSH 2.95 04/18/2021   PSA 1.93 04/25/2022   INR 0.9 08/15/2020   HGBA1C 7.5 (H) 02/21/2023   MICROALBUR 1.9 11/21/2022    ECHOCARDIOGRAM COMPLETE  Result Date: 08/03/2020    ECHOCARDIOGRAM REPORT   Patient Name:   IORI GIGANTE  [place] Date of Exam: 08/03/2020 Medical Rec #:  [id number]        Height:       67.0 in Accession #:    [id number]       Weight:       174.0 lb Date of Birth:  12-May-1960         BSA:          1.906 m Patient Age:    60 years         BP:           116/54 mmHg Patient Gender: M                HR:           54 bpm. Exam Location:  Inpatient Procedure: 2D Echo, Cardiac Doppler and Color Doppler Indications:    Acutre respiratory distress R06.03  History:        Patient has prior history of Echocardiogram examinations, most                 recent 04/23/2015. Previous Myocardial Infarction and CAD; Risk                 Factors:Hypertension, Diabetes and Dyslipidemia.  Sonographer:    Eulah Pont RDCS Referring Phys: Sharla Kidney Werner Le[facility] IMPRESSIONS  1. Left ventricular ejection fraction, by estimation, is 50 to 55%. The left ventricle has low normal function. The left ventricle demonstrates regional wall motion abnormalities (see scoring diagram/findings for description). The basal-to-mid inferior wall is hypokinetic. Left ventricular diastolic parameters are consistent with Grade I diastolic dysfunction (impaired relaxation).  2. Right ventricular systolic function is normal. The right ventricular size is normal. Tricuspid regurgitation signal is inadequate for assessing PA pressure.  3. The mitral valve is normal in structure. Trivial mitral valve regurgitation. No evidence of mitral stenosis.  4. The aortic valve is tricuspid. There is mild calcification of the aortic valve. There is mild thickening of the aortic valve. Aortic valve regurgitation is not visualized. Mild aortic  valve sclerosis is present, with no evidence of aortic valve stenosis.  5. The inferior vena cava is normal in size with <50% respiratory variability, suggesting right atrial pressure of 8 mmHg. Comparison(s): Compared to prior TTE in 2016, the LVEF is now slightly improved to 50-55%. There continues to be basal-to-mid inferior wall hypokinesis. FINDINGS  Left Ventricle: Left ventricular ejection fraction, by estimation, is 50 to 55%. The left ventricle has low normal function. The left ventricle demonstrates regional wall motion abnormalities. The basal-to-mid inferior wall is hypokinetic. The left ventricular internal cavity size was normal in size. There is no left ventricular hypertrophy. Left ventricular diastolic parameters are consistent with Grade I diastolic dysfunction (impaired relaxation). Right Ventricle: The right ventricular size is normal. No increase in right ventricular wall thickness. Right ventricular systolic function is normal. Tricuspid regurgitation signal is inadequate for assessing PA pressure. Left Atrium: Left atrial size was normal in size. Right Atrium: Right atrial size was normal in size. Pericardium: There is no evidence of pericardial effusion. Mitral Valve: The mitral valve is normal in structure. There is mild thickening of the mitral valve leaflet(s). Trivial mitral valve regurgitation. No evidence of mitral valve stenosis. Tricuspid Valve: The tricuspid valve is normal in structure. Tricuspid valve regurgitation is trivial. Aortic Valve: The aortic valve is tricuspid. There is mild calcification of the aortic valve. There is mild thickening of the aortic valve. Aortic valve regurgitation is not visualized. Mild aortic valve  sclerosis is present, with no evidence of aortic valve stenosis. Pulmonic Valve: The pulmonic valve was normal in structure. Pulmonic valve regurgitation is not visualized. Aorta: The aortic root and ascending aorta are structurally normal, with no evidence  of dilitation. Venous: The inferior vena cava is normal in size with less than 50% respiratory variability, suggesting right atrial pressure of 8 mmHg. IAS/Shunts: No atrial level shunt detected by color flow Doppler.  LEFT VENTRICLE PLAX 2D LVIDd:         5.30 cm  Diastology LVIDs:         3.80 cm  LV e' medial:    6.87 cm/s LV PW:         0.90 cm  LV E/e' medial:  10.1 LV IVS:        0.90 cm  LV e' lateral:   13.10 cm/s LVOT diam:     2.10 cm  LV E/e' lateral: 5.3 LV SV:         94 LV SV Index:   49 LVOT Area:     3.46 cm  RIGHT VENTRICLE RV S prime:     9.90 cm/s TAPSE (M-mode): 1.9 cm LEFT ATRIUM             Index       RIGHT ATRIUM           Index LA diam:        3.90 cm 2.05 cm/m  RA Area:     13.20 cm LA Vol (A2C):   59.2 ml 31.06 ml/m RA Volume:   33.20 ml  17.42 ml/m LA Vol (A4C):   60.4 ml 31.69 ml/m LA Biplane Vol: 60.7 ml 31.85 ml/m  AORTIC VALVE LVOT Vmax:   120.00 cm/s LVOT Vmean:  82.000 cm/s LVOT VTI:    0.270 m  AORTA Ao Root diam: 3.10 cm Ao Asc diam:  3.00 cm MITRAL VALVE MV Area (PHT): 3.12 cm    SHUNTS MV Decel Time: 243 msec    Systemic VTI:  0.27 m MV E velocity: 69.60 cm/s  Systemic Diam: 2.10 cm MV A velocity: 68.60 cm/s MV E/A ratio:  1.01 Laurance Flatten MD Electronically signed by Laurance Flatten MD Signature Date/Time: 08/03/2020/11:33:35 AM    Final    DG Chest Port 1 View  Result Date: 08/03/2020 CLINICAL DATA:  Shortness of breath EXAM: PORTABLE CHEST 1 VIEW COMPARISON:  August 02, 2020 FINDINGS: Ill-defined airspace opacity persists in the right upper lobe. More subtle opacity is noted in each lung bases. No consolidation. Heart size and pulmonary vascularity are normal. No adenopathy. There is aortic atherosclerosis. No bone lesions. IMPRESSION: Ill-defined areas of airspace opacity, most notable in the right upper lobe. Question atypical organism pneumonia. No consolidation. Stable cardiac silhouette. Aortic Atherosclerosis (ICD10-I70.0). Electronically Signed   By:  Bretta Bang III M.D.   On: 08/03/2020 08:10   DG Chest Port 1 View  Result Date: 08/02/2020 CLINICAL DATA:  Dyspnea. EXAM: PORTABLE CHEST 1 VIEW COMPARISON:  April 22, 2015. FINDINGS: The heart size and mediastinal contours are within normal limits. No pneumothorax or pleural effusion is noted. Multiple patchy airspace opacities are noted throughout both lungs concerning for multifocal pneumonia. The visualized skeletal structures are unremarkable. IMPRESSION: Multiple patchy airspace opacities are noted bilaterally concerning for multifocal pneumonia. Electronically Signed   By: Lupita Raider M.D.   On: 08/02/2020 14:41    Assessment & Plan:   Essential hypertension - His BP is not controlled and he  is symptomatic. I have encouraged him to be more compliant with his BP meds. 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 Will restart the SGLT-2 inh.  -     Basic metabolic panel; Future -     Hemoglobin A1c; Future -     Ambulatory referral to Ophthalmology -     Urinalysis, Routine w reflex microscopic; Future     Follow-up: Return in about 3 months (around 05/24/2023).  Sanda Linger, MD

## 2023-02-25 ENCOUNTER — Other Ambulatory Visit: Payer: Self-pay | Admitting: Internal Medicine

## 2023-02-25 DIAGNOSIS — E1169 Type 2 diabetes mellitus with other specified complication: Secondary | ICD-10-CM

## 2023-02-25 DIAGNOSIS — E1151 Type 2 diabetes mellitus with diabetic peripheral angiopathy without gangrene: Secondary | ICD-10-CM

## 2023-02-25 DIAGNOSIS — I5189 Other ill-defined heart diseases: Secondary | ICD-10-CM

## 2023-02-25 MED ORDER — DAPAGLIFLOZIN PROPANEDIOL 10 MG PO TABS: 10.0000 mg | ORAL_TABLET | Freq: Every day | ORAL | 1 refills | Status: DC

## 2023-02-25 MED ORDER — EMPAGLIFLOZIN 10 MG PO TABS
10.0000 mg | ORAL_TABLET | Freq: Every day | ORAL | 0 refills | Status: DC
Start: 2023-02-25 — End: 2023-05-25

## 2023-02-26 ENCOUNTER — Telehealth: Payer: Self-pay

## 2023-02-26 NOTE — Progress Notes (Unsigned)
   Care Guide Note  02/26/2023 Name: Justin Galvan MRN: [id number] DOB: 1960/04/30  Referred by: Etta Grandchild, MD Reason for referral : Care Coordination (Outreach to schedule with pharm d )   Justin Galvan is a 63 y.o. year old male who is a primary care patient of Etta Grandchild, MD. [name] was referred to the pharmacist for assistance related to HTN.    An unsuccessful telephone outreach was attempted today to contact the patient who was referred to the pharmacy team for assistance with medication management. Additional attempts will be made to contact the patient.   Penne Lash, RMA Care Guide [facility]  [place], [place] [address] Direct Dial: [phone] [email or web address]

## 2023-02-27 NOTE — Progress Notes (Signed)
   Care Guide Note  02/27/2023 Name: Justin Galvan MRN: [id number] DOB: 08/24/1960  Referred by: Etta Grandchild, MD Reason for referral : Care Coordination (Outreach to schedule with pharm d )   Justin Galvan is a 62 y.o. year old male who is a primary care patient of Etta Grandchild, MD. [name] was referred to the pharmacist for assistance related to HTN.    Successful contact was made with the patient to discuss pharmacy services. Patient declines engagement at this time. Contact information was provided to the patient should they wish to reach out for assistance at a later time.  Penne Lash, RMA Care Guide [facility]  [place], [place] [address] Direct Dial: [phone] [email or web address]

## 2023-05-25 ENCOUNTER — Other Ambulatory Visit: Payer: Self-pay | Admitting: Internal Medicine

## 2023-05-25 DIAGNOSIS — E785 Hyperlipidemia, unspecified: Secondary | ICD-10-CM

## 2023-05-25 DIAGNOSIS — I2510 Atherosclerotic heart disease of native coronary artery without angina pectoris: Secondary | ICD-10-CM

## 2023-05-25 DIAGNOSIS — F418 Other specified anxiety disorders: Secondary | ICD-10-CM

## 2023-05-25 DIAGNOSIS — I10 Essential (primary) hypertension: Secondary | ICD-10-CM

## 2023-05-25 DIAGNOSIS — E1151 Type 2 diabetes mellitus with diabetic peripheral angiopathy without gangrene: Secondary | ICD-10-CM

## 2023-05-25 DIAGNOSIS — E1169 Type 2 diabetes mellitus with other specified complication: Secondary | ICD-10-CM

## 2023-05-25 DIAGNOSIS — N401 Enlarged prostate with lower urinary tract symptoms: Secondary | ICD-10-CM

## 2023-06-30 ENCOUNTER — Other Ambulatory Visit: Payer: Self-pay | Admitting: Internal Medicine

## 2023-06-30 DIAGNOSIS — E119 Type 2 diabetes mellitus without complications: Secondary | ICD-10-CM

## 2023-06-30 DIAGNOSIS — E1169 Type 2 diabetes mellitus with other specified complication: Secondary | ICD-10-CM

## 2023-08-26 ENCOUNTER — Other Ambulatory Visit: Payer: Self-pay | Admitting: Internal Medicine

## 2023-08-26 DIAGNOSIS — E785 Hyperlipidemia, unspecified: Secondary | ICD-10-CM

## 2023-08-26 DIAGNOSIS — E1151 Type 2 diabetes mellitus with diabetic peripheral angiopathy without gangrene: Secondary | ICD-10-CM

## 2023-08-26 DIAGNOSIS — I10 Essential (primary) hypertension: Secondary | ICD-10-CM

## 2023-08-26 DIAGNOSIS — F418 Other specified anxiety disorders: Secondary | ICD-10-CM

## 2023-08-26 DIAGNOSIS — I2510 Atherosclerotic heart disease of native coronary artery without angina pectoris: Secondary | ICD-10-CM

## 2023-08-26 DIAGNOSIS — E1169 Type 2 diabetes mellitus with other specified complication: Secondary | ICD-10-CM

## 2023-09-05 ENCOUNTER — Telehealth: Payer: Self-pay | Admitting: Internal Medicine

## 2023-09-05 NOTE — Telephone Encounter (Signed)
 I advised the patient to reach out to his cardiologist office and get an appointment scheduled as soon as possible so that they could examine him and make sure he needs another CATH done or a STENT placed. He gave a verbal understanding.

## 2023-09-05 NOTE — Telephone Encounter (Signed)
 Copied from CRM [id number]. Topic: Clinical - Lab/Test Results >> Sep 05, 2023 10:21 AM Marlan Silva wrote: Reason for CRM: Patient called in asking if Dr. Rochelle Chu would order him a catherization/stints of his heart @ the [facility]. Patient can be reached at [phone].

## 2023-09-23 ENCOUNTER — Encounter: Payer: Self-pay | Admitting: Internal Medicine

## 2023-09-23 ENCOUNTER — Ambulatory Visit (INDEPENDENT_AMBULATORY_CARE_PROVIDER_SITE_OTHER): Admitting: Internal Medicine

## 2023-09-23 VITALS — BP 152/86 | HR 84 | Temp 97.9°F | Resp 16 | Ht 67.0 in | Wt 174.2 lb

## 2023-09-23 DIAGNOSIS — I25110 Atherosclerotic heart disease of native coronary artery with unstable angina pectoris: Secondary | ICD-10-CM | POA: Insufficient documentation

## 2023-09-23 DIAGNOSIS — Z794 Long term (current) use of insulin: Secondary | ICD-10-CM

## 2023-09-23 DIAGNOSIS — N401 Enlarged prostate with lower urinary tract symptoms: Secondary | ICD-10-CM

## 2023-09-23 DIAGNOSIS — I10 Essential (primary) hypertension: Secondary | ICD-10-CM

## 2023-09-23 DIAGNOSIS — I2510 Atherosclerotic heart disease of native coronary artery without angina pectoris: Secondary | ICD-10-CM | POA: Diagnosis not present

## 2023-09-23 DIAGNOSIS — E119 Type 2 diabetes mellitus without complications: Secondary | ICD-10-CM | POA: Diagnosis not present

## 2023-09-23 DIAGNOSIS — N138 Other obstructive and reflux uropathy: Secondary | ICD-10-CM

## 2023-09-23 DIAGNOSIS — E785 Hyperlipidemia, unspecified: Secondary | ICD-10-CM | POA: Diagnosis not present

## 2023-09-23 DIAGNOSIS — I25118 Atherosclerotic heart disease of native coronary artery with other forms of angina pectoris: Secondary | ICD-10-CM | POA: Insufficient documentation

## 2023-09-23 DIAGNOSIS — Z0000 Encounter for general adult medical examination without abnormal findings: Secondary | ICD-10-CM | POA: Diagnosis not present

## 2023-09-23 DIAGNOSIS — R0609 Other forms of dyspnea: Secondary | ICD-10-CM | POA: Diagnosis not present

## 2023-09-23 DIAGNOSIS — E781 Pure hyperglyceridemia: Secondary | ICD-10-CM | POA: Diagnosis not present

## 2023-09-23 DIAGNOSIS — Z0001 Encounter for general adult medical examination with abnormal findings: Secondary | ICD-10-CM

## 2023-09-23 LAB — CBC WITH DIFFERENTIAL/PLATELET
Basophils Absolute: 0.1 10*3/uL (ref 0.0–0.1)
Basophils Relative: 0.9 % (ref 0.0–3.0)
Eosinophils Absolute: 0.2 10*3/uL (ref 0.0–0.7)
Eosinophils Relative: 2.4 % (ref 0.0–5.0)
HCT: 43.7 % (ref 39.0–52.0)
Hemoglobin: 14.9 g/dL (ref 13.0–17.0)
Lymphocytes Relative: 24 % (ref 12.0–46.0)
Lymphs Abs: 1.7 10*3/uL (ref 0.7–4.0)
MCHC: 34.1 g/dL (ref 30.0–36.0)
MCV: 85 fl (ref 78.0–100.0)
Monocytes Absolute: 0.6 10*3/uL (ref 0.1–1.0)
Monocytes Relative: 9.2 % (ref 3.0–12.0)
Neutro Abs: 4.4 10*3/uL (ref 1.4–7.7)
Neutrophils Relative %: 63.5 % (ref 43.0–77.0)
Platelets: 236 10*3/uL (ref 150.0–400.0)
RBC: 5.14 Mil/uL (ref 4.22–5.81)
RDW: 13 % (ref 11.5–15.5)
WBC: 7 10*3/uL (ref 4.0–10.5)

## 2023-09-23 LAB — MICROALBUMIN / CREATININE URINE RATIO
Creatinine,U: 120.4 mg/dL
Microalb Creat Ratio: 13.7 mg/g (ref 0.0–30.0)
Microalb, Ur: 1.6 mg/dL (ref 0.0–1.9)

## 2023-09-23 LAB — URINALYSIS, ROUTINE W REFLEX MICROSCOPIC
Bilirubin Urine: NEGATIVE
Hgb urine dipstick: NEGATIVE
Ketones, ur: NEGATIVE
Leukocytes,Ua: NEGATIVE
Nitrite: NEGATIVE
Specific Gravity, Urine: 1.025 (ref 1.000–1.030)
Total Protein, Urine: NEGATIVE
Urine Glucose: >=1000 — AB
Urobilinogen, UA: 0.2 (ref 0.0–1.0)
pH: 6 (ref 5.0–8.0)

## 2023-09-23 LAB — BASIC METABOLIC PANEL WITH GFR
BUN: 17 mg/dL (ref 6–23)
CO2: 27 meq/L (ref 19–32)
Calcium: 9.5 mg/dL (ref 8.4–10.5)
Chloride: 102 meq/L (ref 96–112)
Creatinine, Ser: 0.99 mg/dL (ref 0.40–1.50)
GFR: 80.05 mL/min (ref >60.00)
Glucose, Bld: 124 mg/dL — ABNORMAL HIGH (ref 70–99)
Potassium: 3.8 meq/L (ref 3.5–5.1)
Sodium: 140 meq/L (ref 135–145)

## 2023-09-23 LAB — LIPID PANEL
Cholesterol: 137 mg/dL (ref 0–200)
HDL: 41.3 mg/dL (ref >39.00)
LDL Cholesterol: 70 mg/dL (ref 0–99)
NonHDL: 95.58
Total CHOL/HDL Ratio: 3
Triglycerides: 130 mg/dL (ref 0.0–149.0)
VLDL: 26 mg/dL (ref 0.0–40.0)

## 2023-09-23 LAB — BRAIN NATRIURETIC PEPTIDE: Pro B Natriuretic peptide (BNP): 14 pg/mL (ref 0.0–100.0)

## 2023-09-23 LAB — HEPATIC FUNCTION PANEL
ALT: 19 U/L (ref 0–53)
AST: 15 U/L (ref 0–37)
Albumin: 4.5 g/dL (ref 3.5–5.2)
Alkaline Phosphatase: 58 U/L (ref 39–117)
Bilirubin, Direct: 0.1 mg/dL (ref 0.0–0.3)
Total Bilirubin: 0.7 mg/dL (ref 0.2–1.2)
Total Protein: 6.6 g/dL (ref 6.0–8.3)

## 2023-09-23 LAB — TROPONIN I (HIGH SENSITIVITY): High Sens Troponin I: 15 ng/L (ref 2–17)

## 2023-09-23 LAB — HEMOGLOBIN A1C: Hgb A1c MFr Bld: 8 % — ABNORMAL HIGH (ref 4.6–6.5)

## 2023-09-23 LAB — PSA: PSA: 2.55 ng/mL (ref 0.10–4.00)

## 2023-09-23 LAB — TSH: TSH: 3.08 u[IU]/mL (ref 0.35–5.50)

## 2023-09-23 MED ORDER — FREESTYLE LIBRE 3 PLUS SENSOR MISC: 1.0000 | 1 refills | Status: AC

## 2023-09-23 MED ORDER — NITROGLYCERIN 0.4 MG SL SUBL: 0.4000 mg | SUBLINGUAL_TABLET | SUBLINGUAL | 6 refills | Status: DC | PRN

## 2023-09-23 MED ORDER — INSULIN PEN NEEDLE 32G X 6 MM MISC
1.0000 | Freq: Every day | 1 refills | Status: AC
Start: 2023-09-23 — End: ?

## 2023-09-23 MED ORDER — TOUJEO MAX SOLOSTAR 300 UNIT/ML ~~LOC~~ SOPN: 20.0000 [IU] | PEN_INJECTOR | Freq: Every day | SUBCUTANEOUS | 0 refills | Status: DC

## 2023-09-23 MED ORDER — METFORMIN HCL ER 750 MG PO TB24
750.0000 mg | ORAL_TABLET | Freq: Every day | ORAL | 0 refills | Status: AC
Start: 2023-09-23 — End: ?

## 2023-09-23 NOTE — Progress Notes (Unsigned)
 Subjective:  Patient ID: Justin Galvan, male    DOB: 02/11/1961  Age: 63 y.o. MRN: [id number]  CC: Medical Management of Chronic Issues (6 month follow up. Patient states that he thinks he has blockages again in his heart. He needs a referral to cardiology. ), Annual Exam, Hypertension, and Diabetes   HPI Justin Galvan presents for a CPX and f/up ----  Discussed the use of AI scribe software for clinical note transcription with the patient, who gave verbal consent to proceed.  History of Present Illness   Justin Galvan is a 63 year old male with coronary artery disease who presents with a burning sensation in the neck and shortness of breath.  He has been experiencing a burning sensation in his neck, similar to what he felt before his first heart attack. This sensation has persisted for the past two weeks, during which he has been unable to perform activities for more than five minutes without needing to rest for fifteen minutes due to the burning in his neck and head. He also notes that his ears feel the burning sensation.  He experiences shortness of breath during these episodes of burning sensation. No chest pain, coughing, or wheezing. He sometimes experiences dizziness or lightheadedness, which he associates with his medication when his blood pressure gets low.  He has not recently checked his blood pressure but notes that his blood sugar levels have been stable, typically in the nineties in the afternoons. However, he has experienced occasional elevations up to 250 mg/dL, which he finds bothersome. He has not had an eye exam recently.       Outpatient Medications Prior to Visit  Medication Sig Dispense Refill   ASPIRIN  LOW DOSE 81 MG tablet TAKE 1 TABLET (81 MG TOTAL) BY MOUTH DAILY. SWALLOW WHOLE. 90 tablet 1   atorvastatin  (LIPITOR ) 80 MG tablet TAKE 1 TABLET (80 MG TOTAL) BY MOUTH DAILY AT 6PM 30 tablet 2   icosapent  Ethyl (VASCEPA ) 1 g capsule TAKE 2 CAPSULES BY MOUTH 2  TIMES DAILY. 360 capsule 1   indapamide  (LOZOL ) 1.25 MG tablet TAKE 1 TABLET BY MOUTH DAILY. 30 tablet 2   irbesartan  (AVAPRO ) 300 MG tablet TAKE 1 TABLET BY MOUTH EVERY DAY 30 tablet 2   JARDIANCE  10 MG TABS tablet TAKE 1 TABLET BY MOUTH DAILY BEFORE BREAKFAST. 30 tablet 2   PARoxetine  (PAXIL ) 40 MG tablet TAKE 1 TABLET BY MOUTH EVERY DAY 30 tablet 2   tadalafil  (CIALIS ) 20 MG tablet Take 1 tablet (20 mg total) by mouth daily as needed for erectile dysfunction. 8 tablet 0   terazosin  (HYTRIN ) 2 MG capsule TAKE 1 CAPSULE BY MOUTH EVERYDAY AT BEDTIME 30 capsule 5   Continuous Blood Gluc Receiver (FREESTYLE LIBRE 2 READER) DEVI 1 Act by Does not apply route daily. 2 each 5   Continuous Blood Gluc Sensor (FREESTYLE LIBRE 2 SENSOR) MISC APPLY 1 SENSOR AS DIRECTED EVERY 14 DAYS BY DOES NOT APPLY ROUTE DAILY. 2 each 5   insulin  glargine, 2 Unit Dial, (TOUJEO  MAX SOLOSTAR) 300 UNIT/ML Solostar Pen INJECT 20 UNITS INTO THE SKIN DAILY 6 mL 0   Insulin  Pen Needle 32G X 6 MM MISC 1 Act by Does not apply route daily. 100 each 1   metFORMIN  (GLUCOPHAGE -XR) 750 MG 24 hr tablet Take 1 tablet (750 mg total) by mouth daily with breakfast. 180 tablet 1   nitroGLYCERIN  (NITROSTAT ) 0.4 MG SL tablet Place 1 tablet (0.4 mg total) under the tongue  every 5 (five) minutes as needed for chest pain. (Patient not taking: Reported on 09/23/2023) 25 tablet 6   No facility-administered medications prior to visit.    ROS Review of Systems  Constitutional:  Negative for appetite change, chills, diaphoresis, fatigue and fever.  HENT: Negative.  Negative for trouble swallowing.   Eyes:  Negative for visual disturbance.  Respiratory:  Positive for shortness of breath. Negative for apnea, cough, choking, chest tightness and wheezing.   Cardiovascular:  Negative for chest pain, palpitations and leg swelling.  Gastrointestinal: Negative.  Negative for abdominal pain, constipation, diarrhea, nausea and vomiting.  Endocrine:  Negative.   Genitourinary: Negative.  Negative for difficulty urinating and dysuria.  Musculoskeletal: Negative.  Negative for arthralgias and myalgias.  Skin: Negative.  Negative for color change and pallor.  Neurological: Negative.  Negative for dizziness, weakness, light-headedness, numbness and headaches.  Hematological:  Negative for adenopathy. Does not bruise/bleed easily.  Psychiatric/Behavioral: Negative.      Objective:  BP (!) 152/86 (BP Location: Left Arm, Patient Position: Sitting, Cuff Size: Normal)   Pulse 84   Temp 97.9 F (36.6 C) (Oral)   Resp 16   Ht 5\' 7"  (1.702 m)   Wt 174 lb 3.2 oz (79 kg)   SpO2 98%   BMI 27.28 kg/m   BP Readings from Last 3 Encounters:  09/23/23 (!) 152/86  02/21/23 (!) 172/84  11/21/22 (!) 172/88    Wt Readings from Last 3 Encounters:  09/23/23 174 lb 3.2 oz (79 kg)  02/21/23 173 lb 12.8 oz (78.8 kg)  11/21/22 176 lb (79.8 kg)    Physical Exam Vitals reviewed.  Constitutional:      General: He is not in acute distress.    Appearance: Normal appearance. He is not ill-appearing, toxic-appearing or diaphoretic.  HENT:     Nose: Nose normal.     Mouth/Throat:     Mouth: Mucous membranes are moist.  Eyes:     General: No scleral icterus.    Conjunctiva/sclera: Conjunctivae normal.  Cardiovascular:     Rate and Rhythm: Normal rate and regular rhythm.     Pulses:          Carotid pulses are 1+ on the right side and 1+ on the left side.      Radial pulses are 1+ on the right side and 1+ on the left side.       Femoral pulses are 1+ on the right side and 1+ on the left side.      Popliteal pulses are 0 on the right side and 0 on the left side.       Dorsalis pedis pulses are 1+ on the right side and 1+ on the left side.       Posterior tibial pulses are 2+ on the right side and 2+ on the left side.     Heart sounds: No murmur heard.    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     Breath sounds: No stridor. No wheezing, rhonchi or rales.  Abdominal:     General: Abdomen is flat.     Palpations: There is no mass.     Tenderness: There is no abdominal tenderness. There is no guarding.     Hernia: No hernia is present. There is no hernia in the left inguinal area or right inguinal area.  Genitourinary:  Pubic Area: No rash.      Penis: Normal.      Testes: Normal.     Epididymis:     Right: Normal.     Left: Normal.     Prostate: Enlarged. Not tender and no nodules present.     Rectum: Normal. Guaiac result negative. No mass, tenderness, anal fissure, external hemorrhoid or internal hemorrhoid. Normal anal tone.  Musculoskeletal:        General: No tenderness. Normal range of motion.     Cervical back: Neck supple.     Right lower leg: No edema.     Left lower leg: No edema.  Lymphadenopathy:     Cervical: No cervical adenopathy.     Lower Body: No right inguinal adenopathy. No left inguinal adenopathy.  Skin:    General: Skin is warm and dry.  Neurological:     General: No focal deficit present.     Mental Status: He is alert. Mental status is at baseline.  Psychiatric:        Mood and Affect: Mood normal.        Behavior: Behavior normal.     Lab Results  Component Value Date   WBC 7.0 09/23/2023   HGB 14.9 09/23/2023   HCT 43.7 09/23/2023   PLT 236.0 09/23/2023   GLUCOSE 124 (H) 09/23/2023   CHOL 137 09/23/2023   TRIG 130.0 09/23/2023   HDL 41.30 09/23/2023   LDLDIRECT 138.0 04/18/2021   LDLCALC 70 09/23/2023   ALT 19 09/23/2023   AST 15 09/23/2023   NA 140 09/23/2023   K 3.8 09/23/2023   CL 102 09/23/2023   CREATININE 0.99 09/23/2023   BUN 17 09/23/2023   CO2 27 09/23/2023   TSH 3.08 09/23/2023   PSA 2.55 09/23/2023   INR 0.9 08/15/2020   HGBA1C 8.0 (H) 09/23/2023   MICROALBUR 1.6 09/23/2023    ECHOCARDIOGRAM COMPLETE Result Date: 08/03/2020     ECHOCARDIOGRAM REPORT   Patient Name:   ERUBIEL MANASCO Date of Exam: 08/03/2020 Medical Rec #:  [id number]        Height:       67.0 in Accession #:    [id number]       Weight:       174.0 lb Date of Birth:  1960/10/24         BSA:          1.906 m Patient Age:    60 years         BP:           116/54 mmHg Patient Gender: M                HR:           54 bpm. Exam Location:  Inpatient Procedure: 2D Echo, Cardiac Doppler and Color Doppler Indications:    Acutre respiratory distress R06.03  History:        Patient has prior history of Echocardiogram examinations, most                 recent 04/23/2015. Previous Myocardial Infarction and CAD; Risk                 Factors:Hypertension, Diabetes and Dyslipidemia.  Sonographer:    Ruta Cousins RDCS Referring Phys: Mayo Speck Estil Heman [facility] IMPRESSIONS  1. Left ventricular ejection fraction, by estimation, is 50 to 55%. The left ventricle has low normal function. The left ventricle demonstrates regional wall motion abnormalities (see scoring  diagram/findings for description). The basal-to-mid inferior wall is hypokinetic. Left ventricular diastolic parameters are consistent with Grade I diastolic dysfunction (impaired relaxation).  2. Right ventricular systolic function is normal. The right ventricular size is normal. Tricuspid regurgitation signal is inadequate for assessing PA pressure.  3. The mitral valve is normal in structure. Trivial mitral valve regurgitation. No evidence of mitral stenosis.  4. The aortic valve is tricuspid. There is mild calcification of the aortic valve. There is mild thickening of the aortic valve. Aortic valve regurgitation is not visualized. Mild aortic valve sclerosis is present, with no evidence of aortic valve stenosis.  5. The inferior vena cava is normal in size with <50% respiratory variability, suggesting right atrial pressure of 8 mmHg. Comparison(s): Compared to prior TTE in 2016, the LVEF is now slightly improved to 50-55%. There  continues to be basal-to-mid inferior wall hypokinesis. FINDINGS  Left Ventricle: Left ventricular ejection fraction, by estimation, is 50 to 55%. The left ventricle has low normal function. The left ventricle demonstrates regional wall motion abnormalities. The basal-to-mid inferior wall is hypokinetic. The left ventricular internal cavity size was normal in size. There is no left ventricular hypertrophy. Left ventricular diastolic parameters are consistent with Grade I diastolic dysfunction (impaired relaxation). Right Ventricle: The right ventricular size is normal. No increase in right ventricular wall thickness. Right ventricular systolic function is normal. Tricuspid regurgitation signal is inadequate for assessing PA pressure. Left Atrium: Left atrial size was normal in size. Right Atrium: Right atrial size was normal in size. Pericardium: There is no evidence of pericardial effusion. Mitral Valve: The mitral valve is normal in structure. There is mild thickening of the mitral valve leaflet(s). Trivial mitral valve regurgitation. No evidence of mitral valve stenosis. Tricuspid Valve: The tricuspid valve is normal in structure. Tricuspid valve regurgitation is trivial. Aortic Valve: The aortic valve is tricuspid. There is mild calcification of the aortic valve. There is mild thickening of the aortic valve. Aortic valve regurgitation is not visualized. Mild aortic valve sclerosis is present, with no evidence of aortic valve stenosis. Pulmonic Valve: The pulmonic valve was normal in structure. Pulmonic valve regurgitation is not visualized. Aorta: The aortic root and ascending aorta are structurally normal, with no evidence of dilitation. Venous: The inferior vena cava is normal in size with less than 50% respiratory variability, suggesting right atrial pressure of 8 mmHg. IAS/Shunts: No atrial level shunt detected by color flow Doppler.  LEFT VENTRICLE PLAX 2D LVIDd:         5.30 cm  Diastology LVIDs:          3.80 cm  LV e' medial:    6.87 cm/s LV PW:         0.90 cm  LV E/e' medial:  10.1 LV IVS:        0.90 cm  LV e' lateral:   13.10 cm/s LVOT diam:     2.10 cm  LV E/e' lateral: 5.3 LV SV:         94 LV SV Index:   49 LVOT Area:     3.46 cm  RIGHT VENTRICLE RV S prime:     9.90 cm/s TAPSE (M-mode): 1.9 cm LEFT ATRIUM             Index       RIGHT ATRIUM           Index LA diam:        3.90 cm 2.05 cm/m  RA Area:  13.20 cm LA Vol (A2C):   59.2 ml 31.06 ml/m RA Volume:   33.20 ml  17.42 ml/m LA Vol (A4C):   60.4 ml 31.69 ml/m LA Biplane Vol: 60.7 ml 31.85 ml/m  AORTIC VALVE LVOT Vmax:   120.00 cm/s LVOT Vmean:  82.000 cm/s LVOT VTI:    0.270 m  AORTA Ao Root diam: 3.10 cm Ao Asc diam:  3.00 cm MITRAL VALVE MV Area (PHT): 3.12 cm    SHUNTS MV Decel Time: 243 msec    Systemic VTI:  0.27 m MV E velocity: 69.60 cm/s  Systemic Diam: 2.10 cm MV A velocity: 68.60 cm/s MV E/A ratio:  1.01 Riccardo Chamberlain MD Electronically signed by Riccardo Chamberlain MD Signature Date/Time: 08/03/2020/11:33:35 AM    Final    DG Chest Port 1 View Result Date: 08/03/2020 CLINICAL DATA:  Shortness of breath EXAM: PORTABLE CHEST 1 VIEW COMPARISON:  August 02, 2020 FINDINGS: Ill-defined airspace opacity persists in the right upper lobe. More subtle opacity is noted in each lung bases. No consolidation. Heart size and pulmonary vascularity are normal. No adenopathy. There is aortic atherosclerosis. No bone lesions. IMPRESSION: Ill-defined areas of airspace opacity, most notable in the right upper lobe. Question atypical organism pneumonia. No consolidation. Stable cardiac silhouette. Aortic Atherosclerosis (ICD10-I70.0). Electronically Signed   By: Mordecai Applebaum III M.D.   On: 08/03/2020 08:10   DG Chest Port 1 View Result Date: 08/02/2020 CLINICAL DATA:  Dyspnea. EXAM: PORTABLE CHEST 1 VIEW COMPARISON:  April 22, 2015. FINDINGS: The heart size and mediastinal contours are within normal limits. No pneumothorax or pleural  effusion is noted. Multiple patchy airspace opacities are noted throughout both lungs concerning for multifocal pneumonia. The visualized skeletal structures are unremarkable. IMPRESSION: Multiple patchy airspace opacities are noted bilaterally concerning for multifocal pneumonia. Electronically Signed   By: Rosalene Colon M.D.   On: 08/02/2020 14:41    Assessment & Plan:  Hypertriglyceridemia -     Lipid panel; Future  Essential hypertension -     TSH; Future -     CBC with Differential/Platelet; Future -     Basic metabolic panel with GFR; Future -     EKG 12-Lead  Atherosclerosis of native coronary artery of native heart without angina pectoris -     Lipid panel; Future -     Troponin I (High Sensitivity); Future -     Brain natriuretic peptide; Future  BPH with obstruction/lower urinary tract symptoms -     PSA; Future -     Urinalysis, Routine w reflex microscopic; Future  Encounter for well adult exam with abnormal findings- Exam completed, labs reviewed, vaccines reviewed, cancer screenings addressed, pt ed material was given.   Hyperlipidemia with target LDL less than 70 -     Lipid panel; Future -     TSH; Future -     Hepatic function panel; Future  Insulin -requiring or dependent type II diabetes mellitus (HCC) -     Microalbumin / creatinine urine ratio; Future -     Hemoglobin A1c; Future -     Basic metabolic panel with GFR; Future -     HM Diabetes Foot Exam -     Ambulatory referral to Ophthalmology -     metFORMIN  HCl ER; Take 1 tablet (750 mg total) by mouth daily with breakfast.  Dispense: 180 tablet; Refill: 0 -     FreeStyle Libre 3 Plus Sensor; Apply 1 Act topically every 14 (fourteen) days. Change sensor every  15 days.  Dispense: 6 each; Refill: 1 -     Toujeo  Max SoloStar; Inject 20 Units into the skin daily.  Dispense: 6 mL; Refill: 0 -     Insulin  Pen Needle; 1 Act by Does not apply route daily.  Dispense: 100 each; Refill: 1  DOE (dyspnea on  exertion)- Will evaluate with an MPI. -     Troponin I (High Sensitivity); Future -     Brain natriuretic peptide; Future -     MYOCARDIAL PERFUSION IMAGING; Future -     EKG 12-Lead  Coronary artery disease involving native coronary artery of native heart without angina pectoris -     Nitroglycerin ; Place 1 tablet (0.4 mg total) under the tongue every 5 (five) minutes as needed for chest pain.  Dispense: 25 tablet; Refill: 6  Coronary artery disease of native artery of native heart with stable angina pectoris (HCC) -     MYOCARDIAL PERFUSION IMAGING; Future     Follow-up: Return if symptoms worsen or fail to improve.  Sandra Crouch, MD

## 2023-09-23 NOTE — Patient Instructions (Signed)
 Health Maintenance, Male  Adopting a healthy lifestyle and getting preventive care are important in promoting health and wellness. Ask your health care provider about:  The right schedule for you to have regular tests and exams.  Things you can do on your own to prevent diseases and keep yourself healthy.  What should I know about diet, weight, and exercise?  Eat a healthy diet    Eat a diet that includes plenty of vegetables, fruits, low-fat dairy products, and lean protein.  Do not eat a lot of foods that are high in solid fats, added sugars, or sodium.  Maintain a healthy weight  Body mass index (BMI) is a measurement that can be used to identify possible weight problems. It estimates body fat based on height and weight. Your health care provider can help determine your BMI and help you achieve or maintain a healthy weight.  Get regular exercise  Get regular exercise. This is one of the most important things you can do for your health. Most adults should:  Exercise for at least 150 minutes each week. The exercise should increase your heart rate and make you sweat (moderate-intensity exercise).  Do strengthening exercises at least twice a week. This is in addition to the moderate-intensity exercise.  Spend less time sitting. Even light physical activity can be beneficial.  Watch cholesterol and blood lipids  Have your blood tested for lipids and cholesterol at 65 years of age, then have this test every 5 years.  You may need to have your cholesterol levels checked more often if:  Your lipid or cholesterol levels are high.  You are older than 65 years of age.  You are at high risk for heart disease.  What should I know about cancer screening?  Many types of cancers can be detected early and may often be prevented. Depending on your health history and family history, you may need to have cancer screening at various ages. This may include screening for:  Colorectal cancer.  Prostate cancer.  Skin cancer.  Lung  cancer.  What should I know about heart disease, diabetes, and high blood pressure?  Blood pressure and heart disease  High blood pressure causes heart disease and increases the risk of stroke. This is more likely to develop in people who have high blood pressure readings or are overweight.  Talk with your health care provider about your target blood pressure readings.  Have your blood pressure checked:  Every 3-5 years if you are 9-95 years of age.  Every year if you are 85 years old or older.  If you are between the ages of 29 and 29 and are a current or former smoker, ask your health care provider if you should have a one-time screening for abdominal aortic aneurysm (AAA).  Diabetes  Have regular diabetes screenings. This checks your fasting blood sugar level. Have the screening done:  Once every three years after age 23 if you are at a normal weight and have a low risk for diabetes.  More often and at a younger age if you are overweight or have a high risk for diabetes.  What should I know about preventing infection?  Hepatitis B  If you have a higher risk for hepatitis B, you should be screened for this virus. Talk with your health care provider to find out if you are at risk for hepatitis B infection.  Hepatitis C  Blood testing is recommended for:  Everyone born from [birth year] through 1967.  Anyone  with known risk factors for hepatitis C.  Sexually transmitted infections (STIs)  You should be screened each year for STIs, including gonorrhea and chlamydia, if:  You are sexually active and are younger than 65 years of age.  You are older than 65 years of age and your health care provider tells you that you are at risk for this type of infection.  Your sexual activity has changed since you were last screened, and you are at increased risk for chlamydia or gonorrhea. Ask your health care provider if you are at risk.  Ask your health care provider about whether you are at high risk for HIV. Your health care provider  may recommend a prescription medicine to help prevent HIV infection. If you choose to take medicine to prevent HIV, you should first get tested for HIV. You should then be tested every 3 months for as long as you are taking the medicine.  Follow these instructions at home:  Alcohol use  Do not drink alcohol if your health care provider tells you not to drink.  If you drink alcohol:  Limit how much you have to 0-2 drinks a day.  Know how much alcohol is in your drink. In the [place] one drink equals one 12 oz bottle of beer (355 mL), one 5 oz glass of wine (148 mL), or one 1 oz glass of hard liquor (44 mL).  Lifestyle  Do not use any products that contain nicotine or tobacco. These products include cigarettes, chewing tobacco, and vaping devices, such as e-cigarettes. If you need help quitting, ask your health care provider.  Do not use street drugs.  Do not share needles.  Ask your health care provider for help if you need support or information about quitting drugs.  General instructions  Schedule regular health, dental, and eye exams.  Stay current with your vaccines.  Tell your health care provider if:  You often feel depressed.  You have ever been abused or do not feel safe at home.  Summary  Adopting a healthy lifestyle and getting preventive care are important in promoting health and wellness.  Follow your health care provider's instructions about healthy diet, exercising, and getting tested or screened for diseases.  Follow your health care provider's instructions on monitoring your cholesterol and blood pressure.  This information is not intended to replace advice given to you by your health care provider. Make sure you discuss any questions you have with your health care provider.  Document Revised: 08/29/2020 Document Reviewed: 08/29/2020  Elsevier Patient Education  2024 [employer].

## 2023-09-24 ENCOUNTER — Encounter (HOSPITAL_COMMUNITY): Payer: Self-pay

## 2023-09-26 ENCOUNTER — Other Ambulatory Visit: Payer: Self-pay | Admitting: Internal Medicine

## 2023-09-26 DIAGNOSIS — I25118 Atherosclerotic heart disease of native coronary artery with other forms of angina pectoris: Secondary | ICD-10-CM

## 2023-09-26 DIAGNOSIS — R0609 Other forms of dyspnea: Secondary | ICD-10-CM

## 2023-09-26 NOTE — Addendum Note (Signed)
 Addended by: Arcadio Knuckles on: 09/26/2023 02:27 PM   Modules accepted: Orders

## 2023-10-01 ENCOUNTER — Ambulatory Visit (HOSPITAL_COMMUNITY)
Admission: RE | Admit: 2023-10-01 | Discharge: 2023-10-01 | Disposition: A | Discharge status: Home / Self Care | Source: Ambulatory Visit | Attending: Cardiovascular Disease | Admitting: Cardiovascular Disease

## 2023-10-01 DIAGNOSIS — R0609 Other forms of dyspnea: Secondary | ICD-10-CM

## 2023-10-01 DIAGNOSIS — I25118 Atherosclerotic heart disease of native coronary artery with other forms of angina pectoris: Secondary | ICD-10-CM

## 2023-10-03 ENCOUNTER — Ambulatory Visit: Payer: Self-pay | Admitting: Internal Medicine

## 2023-10-06 ENCOUNTER — Other Ambulatory Visit: Payer: Self-pay | Admitting: Internal Medicine

## 2023-10-06 DIAGNOSIS — E1151 Type 2 diabetes mellitus with diabetic peripheral angiopathy without gangrene: Secondary | ICD-10-CM

## 2023-10-06 DIAGNOSIS — E119 Type 2 diabetes mellitus without complications: Secondary | ICD-10-CM

## 2023-10-08 ENCOUNTER — Ambulatory Visit (HOSPITAL_COMMUNITY)
Admission: RE | Admit: 2023-10-08 | Discharge: 2023-10-08 | Disposition: A | Discharge status: Home / Self Care | Source: Ambulatory Visit | Attending: Cardiovascular Disease | Admitting: Cardiovascular Disease

## 2023-10-08 DIAGNOSIS — R0609 Other forms of dyspnea: Secondary | ICD-10-CM | POA: Diagnosis present

## 2023-10-08 DIAGNOSIS — I25118 Atherosclerotic heart disease of native coronary artery with other forms of angina pectoris: Secondary | ICD-10-CM | POA: Insufficient documentation

## 2023-10-08 LAB — MYOCARDIAL PERFUSION IMAGING
LV dias vol: 155 mL (ref 62–150)
LV sys vol: 76 mL (ref 4.2–5.8)
Nuc Stress EF: 51 %
Peak HR: 90 {beats}/min
Rest HR: 68 {beats}/min
Rest Nuclear Isotope Dose: 10.5 mCi
SDS: 3
SRS: 22
SSS: 23
ST Depression (mm): 0 mm
Stress Nuclear Isotope Dose: 31.7 mCi
TID: 1.03

## 2023-10-08 MED ORDER — REGADENOSON 0.4 MG/5ML IV SOLN
0.4000 mg | Freq: Once | INTRAVENOUS | Status: AC
  Administered 2023-10-08: 0.4 mg via INTRAVENOUS

## 2023-10-08 MED ORDER — TECHNETIUM TC 99M TETROFOSMIN IV KIT
31.7000 | PACK | Freq: Once | INTRAVENOUS | Status: AC | PRN
  Administered 2023-10-08: 31.7 via INTRAVENOUS

## 2023-10-08 MED ORDER — TECHNETIUM TC 99M TETROFOSMIN IV KIT
10.5000 | PACK | Freq: Once | INTRAVENOUS | Status: AC | PRN
  Administered 2023-10-08: 10.5 via INTRAVENOUS

## 2023-10-08 MED ORDER — REGADENOSON 0.4 MG/5ML IV SOLN
INTRAVENOUS | Status: AC
  Filled 2023-10-08: qty 5

## 2023-10-09 ENCOUNTER — Other Ambulatory Visit: Payer: Self-pay | Admitting: Internal Medicine

## 2023-10-09 DIAGNOSIS — R931 Abnormal findings on diagnostic imaging of heart and coronary circulation: Secondary | ICD-10-CM | POA: Insufficient documentation

## 2023-10-09 DIAGNOSIS — I5189 Other ill-defined heart diseases: Secondary | ICD-10-CM

## 2023-10-27 ENCOUNTER — Other Ambulatory Visit: Payer: Self-pay | Admitting: Internal Medicine

## 2023-10-27 DIAGNOSIS — I2510 Atherosclerotic heart disease of native coronary artery without angina pectoris: Secondary | ICD-10-CM

## 2023-10-27 DIAGNOSIS — E785 Hyperlipidemia, unspecified: Secondary | ICD-10-CM

## 2023-11-08 ENCOUNTER — Telehealth: Payer: Self-pay

## 2023-11-08 ENCOUNTER — Other Ambulatory Visit (HOSPITAL_COMMUNITY): Payer: Self-pay

## 2023-11-08 NOTE — Telephone Encounter (Signed)
 Pharmacy Patient Advocate Encounter   Received notification from Onbase that prior authorization for Vascepa  1 gm is required/requested.   Insurance verification completed.   The patient is insured through [employer] .   Per test claim: The current 30 day co-pay is, $10.00.  No PA needed at this time. This test claim was processed through [facility]- copay amounts may vary at other pharmacies due to pharmacy/plan contracts, or as the patient moves through the different stages of their insurance plan.

## 2023-11-08 NOTE — Telephone Encounter (Signed)
 Patient has been made aware and gave a verbal understanding.

## 2023-12-01 ENCOUNTER — Other Ambulatory Visit: Payer: Self-pay | Admitting: Internal Medicine

## 2023-12-01 DIAGNOSIS — E1169 Type 2 diabetes mellitus with other specified complication: Secondary | ICD-10-CM

## 2023-12-01 DIAGNOSIS — E1151 Type 2 diabetes mellitus with diabetic peripheral angiopathy without gangrene: Secondary | ICD-10-CM

## 2023-12-01 DIAGNOSIS — F418 Other specified anxiety disorders: Secondary | ICD-10-CM

## 2023-12-01 DIAGNOSIS — I10 Essential (primary) hypertension: Secondary | ICD-10-CM

## 2023-12-01 DIAGNOSIS — N138 Other obstructive and reflux uropathy: Secondary | ICD-10-CM

## 2023-12-01 DIAGNOSIS — I2510 Atherosclerotic heart disease of native coronary artery without angina pectoris: Secondary | ICD-10-CM

## 2023-12-24 ENCOUNTER — Telehealth: Payer: Self-pay

## 2023-12-24 NOTE — Telephone Encounter (Signed)
 Copied from CRM #[id number]. Topic: Referral - Question >> Dec 24, 2023  9:15 AM Laymon HERO wrote: Reason for CRM: Patient looking for a referral for an ENT doctor for Vertigo

## 2023-12-26 NOTE — Telephone Encounter (Signed)
 Can we place this referral.

## 2023-12-30 ENCOUNTER — Other Ambulatory Visit: Payer: Self-pay | Admitting: Internal Medicine

## 2023-12-30 DIAGNOSIS — R42 Dizziness and giddiness: Secondary | ICD-10-CM | POA: Insufficient documentation

## 2024-01-10 ENCOUNTER — Encounter (HOSPITAL_BASED_OUTPATIENT_CLINIC_OR_DEPARTMENT_OTHER): Payer: Self-pay

## 2024-01-14 ENCOUNTER — Encounter: Payer: Self-pay | Admitting: *Deleted

## 2024-01-14 ENCOUNTER — Encounter: Payer: Self-pay | Admitting: Cardiology

## 2024-01-14 ENCOUNTER — Ambulatory Visit: Attending: Cardiology | Admitting: Cardiology

## 2024-01-14 VITALS — BP 160/84 | HR 72 | Ht 67.0 in | Wt 177.0 lb

## 2024-01-14 DIAGNOSIS — Z794 Long term (current) use of insulin: Secondary | ICD-10-CM

## 2024-01-14 DIAGNOSIS — I25118 Atherosclerotic heart disease of native coronary artery with other forms of angina pectoris: Secondary | ICD-10-CM | POA: Diagnosis not present

## 2024-01-14 DIAGNOSIS — I5189 Other ill-defined heart diseases: Secondary | ICD-10-CM

## 2024-01-14 DIAGNOSIS — Z01812 Encounter for preprocedural laboratory examination: Secondary | ICD-10-CM

## 2024-01-14 DIAGNOSIS — I200 Unstable angina: Secondary | ICD-10-CM | POA: Insufficient documentation

## 2024-01-14 DIAGNOSIS — I25110 Atherosclerotic heart disease of native coronary artery with unstable angina pectoris: Secondary | ICD-10-CM | POA: Diagnosis not present

## 2024-01-14 DIAGNOSIS — I10 Essential (primary) hypertension: Secondary | ICD-10-CM | POA: Diagnosis not present

## 2024-01-14 DIAGNOSIS — E785 Hyperlipidemia, unspecified: Secondary | ICD-10-CM

## 2024-01-14 DIAGNOSIS — E119 Type 2 diabetes mellitus without complications: Secondary | ICD-10-CM

## 2024-01-14 MED ORDER — ISOSORBIDE MONONITRATE ER 30 MG PO TB24: 30.0000 mg | ORAL_TABLET | Freq: Every day | ORAL | 3 refills | Status: DC

## 2024-01-14 MED ORDER — CARVEDILOL 3.125 MG PO TABS: 3.1250 mg | ORAL_TABLET | Freq: Two times a day (BID) | ORAL | 3 refills | Status: DC

## 2024-01-14 NOTE — H&P (View-Only) (Signed)
 Cardiology Office Note:  .   Date:  01/14/2024  ID:  Justin Galvan, DOB [birth year], MRN [id number] PCP: Joshua Debby CROME, MD  [facility] Providers Cardiologist:  Darryle ONEIDA Decent, MD     Chief Complaint  Patient presents with   New Patient (Initial Visit)    Reestablish cardiology care with complaints of chest pain   Coronary Artery Disease    Known CAD with RCA CTO with in-stent occlusion as well as patent LAD stent.  Last seen in 2020 Myoview  with inferior infarct but no suggestion of ischemia.   Cardiomyopathy    EF estimated 45 to 54% on Myoview     Patient Profile: .     Justin Galvan is a 63 y.o. male former smoker (quit in the 70s) with a PMH notable for CAD, HTN, HLD, DM-2 who presents here to Reestablish Cardiology Care with complaints of exertional chest pain concerning for angina.  Referred for reassessment at the request of Joshua Debby CROME, MD.  PMH: CAD - known RCA CTO & LAD PCI Mi 1997; MI 2001 -> as of 2005, and had 3 RCA stents (proximal, mid and distal nonoverlapping. July 08, 2003: 70% tubular stenosis in the mid LAD noted with 70% D1.  Diffuse proximal RCA stent ISR 30 to 40% followed by 95% hazy lesion with ruptured plaque distal to for stent.  Mid RCA stent 20% ISR and then distal 50% ISR.  50 to 70% lesion in between the 2; proximal RCA (distal stent overlapped with 2.75 x 16 mm Taxus DES postdilated with stent balloon and then 3.0 mm [place] balloon.  And mid to distal RCA (Taxus 2.75 x 16 mm stent deployed at high atmospheres) PCI Mid to distal RCA stent CTO as of January 2017) Mid LAD LAD PCI January 2015: 3.0 x 12 mm Promus + to 3.1 mm (90% reduced to 0%) (Dr. Verlin) HTN HLD DM-2     Justin Galvan was last seen by Dr. Devora Decent in October 2020 for follow-up with abnormal Myoview  suggesting RCA infarction.  He was doing well with no angina.  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  No exertional chest pain or dyspnea.   Blood pressure was slightly elevated but that he did note some discomfort.  Since restarting his ARB and chlorthalidone  combination he was doing well.  Blood pressures were better.  Myoview  showed large inferior infarct with peri-infarct ischemia.  No active angina.  Had been started on SGLT2 inhibitor.  Noted some whitecoat hypertension.  He just had a stress test ordered by Dr. Joshua with complaints of burning sensation in the neck or dyspnea.  Stated that was similar to his prior MI.  Stress test ordered as reviewed below.  Prior infarct with no ischemia  Subjective  Discussed the use of AI scribe software for clinical note transcription with the patient, who gave verbal consent to proceed.  History of Present Illness Justin Galvan is a 63 year old male with coronary artery disease and hypertension who presents with progressive angina symptoms. He was referred by a previous provider after a stress test in June showed an inferior infarct but no ischemia.  He has a history of coronary artery disease with an occluded right coronary artery and a stent in the left anterior descending artery, which was patent as of a catheterization in 2017. He has experienced progressive angina symptoms since April, occurring almost daily and requiring nitroglycerin  for relief. The symptoms are primarily  exertional, occurring during physical activity, and are characterized by left arm pain, neck burning, and head burning, rather than typical chest pain.  He has a history of two myocardial infarctions, the first at age 55 in 60 and the second approximately four years later. During a catheterization in 2017, it was noted that one artery was completely closed, and the previously placed stent in another artery remained patent. His current symptoms are similar to those experienced during his first heart attack.  His blood pressure is typically elevated, with systolic readings in the 140s to 150s and diastolic readings  in the 60s to 70s. He experiences dizziness when his blood pressure is lower than usual, such as 120/70, which he considers low for him. He is currently taking Avapro  300 mg for blood pressure and reports taking two blood pressure medications, though only one is confirmed in the medication list. He also takes aspirin  regularly.  No resting chest pain, swelling, or shortness of breath when lying flat. He experiences occasional heart fluttering lasting about 15 minutes, not associated with pain. He denies any recent stroke-like symptoms or passing out spells, though he reports feeling 'swammy headed' when his blood pressure is low.   Cardiovascular ROS: positive for - chest pain, dyspnea on exertion, irregular heartbeat, and symptoms have progressed.  Also noting fluttering sensations.  Intermittent dizziness spells with lower blood pressures that are actually in the normal range. negative for - edema, orthopnea, paroxysmal nocturnal dyspnea, rapid heart rate, shortness of breath, or syncope or near syncope; TIA/amaurosis fugax or claudication.  Melena, hematochezia, hematuria epistaxis.  ROS:  Review of Systems - Negative except symptoms noted above    Objective   Current Meds  Medication Sig   ASPIRIN  LOW DOSE 81 MG tablet TAKE 1 TABLET (81 MG TOTAL) BY MOUTH DAILY. SWALLOW WHOLE.   atorvastatin  (LIPITOR ) 80 MG tablet TAKE 1 TABLET (80 MG TOTAL) BY MOUTH DAILY AT 6PM   icosapent  Ethyl (VASCEPA ) 1 g capsule TAKE 2 CAPSULES BY MOUTH 2 TIMES DAILY.   indapamide  (LOZOL ) 1.25 MG tablet TAKE 1 TABLET BY MOUTH DAILY.   insulin  glargine, 2 Unit Dial, (TOUJEO  MAX SOLOSTAR) 300 UNIT/ML Solostar Pen INJECT 20 UNITS INTO THE SKIN DAILY   irbesartan  (AVAPRO ) 300 MG tablet TAKE 1 TABLET BY MOUTH EVERY DAY   JARDIANCE  10 MG TABS tablet TAKE 1 TABLET BY MOUTH DAILY BEFORE BREAKFAST.   metFORMIN  (GLUCOPHAGE -XR) 750 MG 24 hr tablet Take 1 tablet (750 mg total) by mouth daily with breakfast.   nitroGLYCERIN   (NITROSTAT ) 0.4 MG SL tablet Place 1 tablet (0.4 mg total) under the tongue every 5 (five) minutes as needed for chest pain.   PARoxetine  (PAXIL ) 40 MG tablet TAKE 1 TABLET BY MOUTH EVERY DAY   tadalafil  (CIALIS ) 20 MG tablet Take 1 tablet (20 mg total) by mouth daily as needed for erectile dysfunction.   terazosin  (HYTRIN ) 2 MG capsule TAKE 1 CAPSULE BY MOUTH EVERYDAY AT BEDTIME    Studies Reviewed: SABRA   EKG Interpretation Date/Time:  Tuesday January 14 2024 08:19:07 EDT Ventricular Rate:  72 PR Interval:  152 QRS Duration:  110 QT Interval:  386 QTC Calculation: 422 R Axis:   8  Text Interpretation: Normal sinus rhythm Inferior infarct , age undetermined When compared with ECG of 02-Aug-2020 12:27, PREVIOUS ECG IS PRESENT Confirmed by Anner Lenis ([id number]) on 01/14/2024 8:35:17 AM    Lab Results  Component Value Date   CHOL 137 09/23/2023   HDL 41.30 09/23/2023  LDLCALC 70 09/23/2023   LDLDIRECT 138.0 04/18/2021   TRIG 130.0 09/23/2023   CHOLHDL 3 09/23/2023   Lab Results  Component Value Date   NA 140 09/23/2023   CL 102 09/23/2023   K 3.8 09/23/2023   CO2 27 09/23/2023   BUN 17 09/23/2023   CREATININE 0.99 09/23/2023   GFR 80.05 09/23/2023   CALCIUM  9.5 09/23/2023   PHOS 3.1 08/03/2020   ALBUMIN 4.5 09/23/2023   GLUCOSE 124 (H) 09/23/2023   Lab Results  Component Value Date   HGBA1C 8.0 (H) 09/23/2023  BNP 14  CARDIAC STUDIES Myoview  10/08/2023: EF 45 to 54% (51%) large size mid to basal inferior fixed defect consistent with prior infarct.  No ischemia.  INTERMEDIATE RISK due to size of infarct.  Previous Studies Cath-PCI 09/06/2010: INFERIOR STEMI-mid RCA occlusion after proximal stent. => Thrombectomy with ISR PTCA (3.0 x 22 mm.) CATH (04/27/2015: Two-vessel CAD.  Patent LAD stent.  Mid LAD has segmental 20% stenosis involving 30% ostial D2.  Severe disease in small jailed D1 (90%) - Too small for PCI.  Proximal RCA stent 50% ISR followed by CTO of mid RCA with  left-to-right collaterals.  Mild segmental LV dysfunction.  (Dr. Verlin)   Myoview  October 2020: EF estimated 30 to 44% (37%).  Large sized severe intensity basal-apical inferoseptal inferior and inferolateral and apical septal infarct.  Findings consistent with prior infarct with peri-infarct ischemia. Echo (08/03/2020): EF 50 to 55%.  Basal to mid inferior hypokinesis.  GR 1 DD.  Calcific aortic valve with mild sclerosis no stenosis.  Mildly elevated RAP 8 mmHg. => EF with inferior hypokinesis stable from previous echo in 2016.   Risk Assessment/Calculations:     HYPERTENSION CONTROL Vitals:   01/14/24 0814 01/14/24 2002  BP: (!) 180/86 (!) 160/84    The patient's blood pressure is elevated above target today.  In order to address the patient's elevated BP: Blood pressure will be monitored at home to determine if medication changes need to be made.; A new medication was prescribed today.; The blood pressure is usually elevated in clinic.  Blood pressures monitored at home have been optimal. (Adding carvedilol  3.125 mg twice daily along with Imdur  30 mg daily)           Physical Exam:   VS:  BP (!) 160/84   Pulse 72   Ht 5' 7 (1.702 m)   Wt 177 lb (80.3 kg)   SpO2 97%   BMI 27.72 kg/m    Wt Readings from Last 3 Encounters:  01/14/24 177 lb (80.3 kg)  09/23/23 174 lb 3.2 oz (79 kg)  02/21/23 173 lb 12.8 oz (78.8 kg)      GEN: Well nourished, well groomed in no acute distress; healthy-appearing NECK: No JVD; No carotid bruits CARDIAC: Normal S1, S2; RRR, no murmurs, rubs, gallops RESPIRATORY:  Clear to auscultation without rales, wheezing or rhonchi ; nonlabored, good air movement. ABDOMEN: Soft, non-tender, non-distended EXTREMITIES:  No edema; No deformity      ASSESSMENT AND PLAN: .    Problem List Items Addressed This Visit       Cardiology Problems   CAD, NATIVE VESSEL, Hx. stent-RCA 1997; 2 Taxusprox & mid RCA 06/2003, cath 05/06/13 100% RCA, mid LAD disease  (Chronic)   Known two-vessel Coronary artery disease with occluded right coronary artery and patent LAD stent. Stress tests show inferior infarct without new ischemia. Daily exertional symptoms relieved by nitroglycerin , indicating angina due to supply-demand mismatch, likely from right  coronary artery blockage. On maximum irbesartan  and nitroglycerin . -Continue Avapro  30 mg daily along with 80 mg atorvastatin  and Vascepa . -Continue aspirin  81 mg daily -Add Imdur  30 mg daily along with carvedilol  3.125 mg twice daily for additional blood pressure control and antianginal benefit. - Schedule cardiac catheterization with possible PCI with Dr. Verlin on September 30th.      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  Concern would be is that the catheter be an issue with the LAD stent or potentially LCx which would lead to reduction in left-to-right collateralization. He did have a stress test that was nonischemic however symptoms are very concerning and can be potentially equivocal.  He also has hypertension which could be contributing. - Start long-acting nitroglycerin  (Imdur  30 mg) for symptom management. - Initiate carvedilol  3.125 mg twice daily to lower heart rate and blood pressure. - Monitor blood pressure, maintain 120/70 mmHg to reduce angina risk.  With continued symptoms, despite nonischemic Myoview , I feel obligated to proceed with invasive evaluation and cardiac catheterization and possible PCI.  I do not have Cath Lab availability in the next few days, I will therefore schedule him for cardiac catheterization with Dr.  Verlin on September 30.  Dr. Verlin has performed at least one of the PCI catheterizations as well as the most recent catheterization.  See Shared Decision Making/Informed Consent Statement below      Relevant Medications   isosorbide  mononitrate (IMDUR ) 30 MG 24 hr tablet   carvedilol  (COREG ) 3.125 MG tablet   Essential hypertension (Chronic)   Poorly controlled blood pressure today on irbesartan  300 mg daily and Hytrin  2 mg.  He is also on Lozol  1.25 mg daily. Carvedilol  addition planned to lower heart rate and blood pressure, aiming for 120s systolic to reduce angina ris Adding Imdur  30 mg daily for antianginal Not on beta-blocker, and with concerns for angina we will add carvedilol  3.25 mg twice daily.   Can titrate this up as long as blood pressure and heart rate tolerate.   Next medication will be amlodipine  for antianginal benefit.      Relevant Medications   isosorbide  mononitrate (IMDUR ) 30 MG 24 hr tablet   carvedilol  (COREG ) 3.125 MG tablet   Hyperlipidemia with target low density lipoprotein (LDL) cholesterol less than 55 mg/dL (Chronic)   Most recent LDL was 70 as of June.  This is the first cutoff for target, but would actually like to see less than 55 with ongoing symptoms. For now we will continue atorvastatin  80 mg daily and Vascepa  2 g twice daily  -- Low threshold to consider adding ezetimibe  10 mg plus or minus bempedoic acid 180 mg daily      Relevant Medications   isosorbide  mononitrate (IMDUR ) 30 MG 24 hr tablet   carvedilol  (COREG ) 3.125 MG tablet   Progressive angina (HCC) (Chronic)   I am concerned that his symptoms are occurring with more frequency and more intensity.  He had a negative stress test 3 months ago but with known occluded RCA and collateral flow, there is also possibility of balanced ischemia. Will plan for cardiac catheterization. Will add beta-blocker 3.125  mg carvedilol  twice daily as well as Imdur  30 mg daily.      Relevant  Medications   isosorbide  mononitrate (IMDUR ) 30 MG 24 hr tablet   carvedilol  (COREG ) 3.125 MG tablet   Other Relevant Orders   CBC   Basic metabolic panel with GFR     Other   Grade I diastolic dysfunction   Diastolic strain noted on echo which is not unexpected in the setting of CAD and MI.  Also now with elevated elevated high blood pressure, it is possible that symptoms could simply be related to ischemia from microvascular disease.  Need to lower blood pressure-adding carvedilol  3.125 mg twice daily.  Will also add Imdur , but low threshold to consider amlodipine  additionally for potential microvascular benefit. He is already on high-dose irbesartan , along with Lozol  and Hytrin       Insulin -requiring or dependent type II diabetes mellitus (HCC) (Chronic)   Not at goal with an A1c of 8.0.  Defer to PCP.   He is already on SGLT2 inhibitor, metformin  along with insulin .      Pre-procedure lab exam   Labs checked for cardiac catheterization.      Relevant Orders   CBC   Basic metabolic panel with GFR       Cardiac Catheterization planned with Dr. Verlin on September 30. Informed Consent   Shared Decision Making/Informed Consent The risks [stroke (1 in 1000), death (1 in 1000), kidney failure [usually temporary] (1 in 500), bleeding (1 in 200), allergic reaction [possibly serious] (1 in 200)], benefits (diagnostic support and management of coronary artery disease) and alternatives of a cardiac catheterization were discussed in detail with Mr. Rebstock and he is willing to proceed.      Follow-Up: Return in about 3 weeks (around 02/04/2024) for Post cath visit with APP.   I spent 77 minutes in the care of BION TODOROV today including reviewing labs (1 minute), reviewing studies (multiple tests including several cath films, Myoview 's and echocardiograms reviewed-17 minutes), face to face time discussing treatment options (34 minutes), reviewing records from last note from Dr.  Barbaraann, discharge summary from last PCI and cardiac catheterization as well as note from PCP (10 minutes), 15 minutes dictating, and documenting in the encounter.        Signed, Alm MICAEL Clay, MD, MS Alm Clay, M.D., M.S. Interventional Cardiologist  [facility] Pager # [phone]

## 2024-01-14 NOTE — Patient Instructions (Signed)
 Medication Instructions:  Start Imdur  30 mg daily Start Carvedilol  3.125 mg twice daily *If you need a refill on your cardiac medications before your next appointment, please call your pharmacy*  Lab Work: CBC, BMP If you have labs (blood work) drawn today and your tests are completely normal, you will receive your results only by: MyChart Message (if you have MyChart) OR A paper copy in the mail If you have any lab test that is abnormal or we need to change your treatment, we will call you to review the results.  Testing/Procedures: Your physician has requested that you have a cardiac catheterization. Cardiac catheterization is used to diagnose and/or treat various heart conditions. Doctors may recommend this procedure for a number of different reasons. The most common reason is to evaluate chest pain. Chest pain can be a symptom of coronary artery disease (CAD), and cardiac catheterization can show whether plaque is narrowing or blocking your heart's arteries. This procedure is also used to evaluate the valves, as well as measure the blood flow and oxygen levels in different parts of your heart. For further information please visit [email or web address] Please follow instruction sheet, as given.    Follow-Up: At [facility], you and your health needs are our priority.  As part of our continuing mission to provide you with exceptional heart care, our providers are all part of one team.  This team includes your primary Cardiologist (physician) and Advanced Practice Providers or APPs (Physician Assistants and Nurse Practitioners) who all work together to provide you with the care you need, when you need it.  Your next appointment:   Will be scheduled after procedure   We recommend signing up for the patient portal called MyChart.  Sign up information is provided on this After Visit Summary.  MyChart is used to connect with patients for Virtual Visits (Telemedicine).  Patients are able  to view lab/test results, encounter notes, upcoming appointments, etc.  Non-urgent messages can be sent to your provider as well.   To learn more about what you can do with MyChart, go to [email or web address].      [facility] A DEPT OF [place] AT [address] A DEPT OF THE [place]. [facility] [address] [place] [address] Dept: [phone] Loc: [phone]  Justin Galvan  01/14/2024  You are scheduled for a Cardiac Catheterization on Tuesday, September 30 with Dr. Lonni Galvan.  1. Please arrive at the [facility] (Main Entrance A) at [facility]: [address] [place], [place] [address] at 8:30 AM (This time is 2 hour(s) before your procedure to ensure your preparation).   Free valet parking service is available. You will check in at ADMITTING. The support person will be asked to wait in the waiting room.  It is OK to have someone drop you off and come back when you are ready to be discharged.    Special note: Every effort is made to have your procedure done on time. Please understand that emergencies sometimes delay scheduled procedures.  2. Diet: Nothing to eat after midnight.   3. Hydration: You need to be well hydrated before your procedure. On September 30, you may drink approved liquids (see below) until 2 hours before the procedure, with 16 oz of water as your last intake.   List of approved liquids water, clear juice, clear tea, black coffee, fruit juices, non-citric and without pulp, carbonated beverages, Gatorade, Kool -Aid, plain Jello-O and  plain ice popsicles.  4. Labs: You will need to have blood drawn on Tuesday, September 23 at [facility] (1st Floor), [address] [place], [place] [address]. You do not need to be fasting.  5. Medication instructions in preparation for your procedure:   Contrast Allergy: No  Stop taking, Avapro  (Irbesartan )  Sunday, September 28,  Do not take any insulin  on the day of the procedure.  Do not take Diabetes Med Glucophage  (Metformin ) on the day of the procedure and HOLD 48 HOURS AFTER THE PROCEDURE.  On the morning of your procedure, take your Aspirin  81 mg and any morning medicines NOT listed above.  You may use sips of water.  6. Plan to go home the same day, you will only stay overnight if medically necessary. 7. Bring a current list of your medications and current insurance cards. 8. You MUST have a responsible person to drive you home. 9. Someone MUST be with you the first 24 hours after you arrive home or your discharge will be delayed. 10. Please wear clothes that are easy to get on and off and wear slip-on shoes.  Thank you for allowing us  to care for you!   -- [facility]

## 2024-01-14 NOTE — Assessment & Plan Note (Signed)
 Labs checked for cardiac catheterization.

## 2024-01-14 NOTE — Progress Notes (Signed)
 Cardiology Office Note:  .   Date:  01/14/2024  ID:  Justin Galvan, DOB [birth year], MRN [id number] PCP: Joshua Debby CROME, MD  [facility] Providers Cardiologist:  Darryle ONEIDA Decent, MD     Chief Complaint  Patient presents with   New Patient (Initial Visit)    Reestablish cardiology care with complaints of chest pain   Coronary Artery Disease    Known CAD with RCA CTO with in-stent occlusion as well as patent LAD stent.  Last seen in 2020 Myoview  with inferior infarct but no suggestion of ischemia.   Cardiomyopathy    EF estimated 45 to 54% on Myoview     Patient Profile: .     Justin Galvan is a 62 y.o. male former smoker (quit in the 70s) with a PMH notable for CAD, HTN, HLD, DM-2 who presents here to Reestablish Cardiology Care with complaints of exertional chest pain concerning for angina.  Referred for reassessment at the request of Joshua Debby CROME, MD.  PMH: CAD - known RCA CTO & LAD PCI Mi 1997; MI 2001 -> as of 2005, and had 3 RCA stents (proximal, mid and distal nonoverlapping. July 08, 2003: 70% tubular stenosis in the mid LAD noted with 70% D1.  Diffuse proximal RCA stent ISR 30 to 40% followed by 95% hazy lesion with ruptured plaque distal to for stent.  Mid RCA stent 20% ISR and then distal 50% ISR.  50 to 70% lesion in between the 2; proximal RCA (distal stent overlapped with 2.75 x 16 mm Taxus DES postdilated with stent balloon and then 3.0 mm [place] balloon.  And mid to distal RCA (Taxus 2.75 x 16 mm stent deployed at high atmospheres) PCI Mid to distal RCA stent CTO as of January 2017) Mid LAD LAD PCI January 2015: 3.0 x 12 mm Promus + to 3.1 mm (90% reduced to 0%) (Dr. Verlin) HTN HLD DM-2     Justin Galvan was last seen by Dr. Devora Decent in October 2020 for follow-up with abnormal Myoview  suggesting RCA infarction.  He was doing well with no angina.  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  No exertional chest pain or dyspnea.   Blood pressure was slightly elevated but that he did note some discomfort.  Since restarting his ARB and chlorthalidone  combination he was doing well.  Blood pressures were better.  Myoview  showed large inferior infarct with peri-infarct ischemia.  No active angina.  Had been started on SGLT2 inhibitor.  Noted some whitecoat hypertension.  He just had a stress test ordered by Dr. Joshua with complaints of burning sensation in the neck or dyspnea.  Stated that was similar to his prior MI.  Stress test ordered as reviewed below.  Prior infarct with no ischemia  Subjective  Discussed the use of AI scribe software for clinical note transcription with the patient, who gave verbal consent to proceed.  History of Present Illness Justin Galvan is a 63 year old male with coronary artery disease and hypertension who presents with progressive angina symptoms. He was referred by a previous provider after a stress test in June showed an inferior infarct but no ischemia.  He has a history of coronary artery disease with an occluded right coronary artery and a stent in the left anterior descending artery, which was patent as of a catheterization in 2017. He has experienced progressive angina symptoms since April, occurring almost daily and requiring nitroglycerin  for relief. The symptoms are primarily  exertional, occurring during physical activity, and are characterized by left arm pain, neck burning, and head burning, rather than typical chest pain.  He has a history of two myocardial infarctions, the first at age 55 in 60 and the second approximately four years later. During a catheterization in 2017, it was noted that one artery was completely closed, and the previously placed stent in another artery remained patent. His current symptoms are similar to those experienced during his first heart attack.  His blood pressure is typically elevated, with systolic readings in the 140s to 150s and diastolic readings  in the 60s to 70s. He experiences dizziness when his blood pressure is lower than usual, such as 120/70, which he considers low for him. He is currently taking Avapro  300 mg for blood pressure and reports taking two blood pressure medications, though only one is confirmed in the medication list. He also takes aspirin  regularly.  No resting chest pain, swelling, or shortness of breath when lying flat. He experiences occasional heart fluttering lasting about 15 minutes, not associated with pain. He denies any recent stroke-like symptoms or passing out spells, though he reports feeling 'swammy headed' when his blood pressure is low.   Cardiovascular ROS: positive for - chest pain, dyspnea on exertion, irregular heartbeat, and symptoms have progressed.  Also noting fluttering sensations.  Intermittent dizziness spells with lower blood pressures that are actually in the normal range. negative for - edema, orthopnea, paroxysmal nocturnal dyspnea, rapid heart rate, shortness of breath, or syncope or near syncope; TIA/amaurosis fugax or claudication.  Melena, hematochezia, hematuria epistaxis.  ROS:  Review of Systems - Negative except symptoms noted above    Objective   Current Meds  Medication Sig   ASPIRIN  LOW DOSE 81 MG tablet TAKE 1 TABLET (81 MG TOTAL) BY MOUTH DAILY. SWALLOW WHOLE.   atorvastatin  (LIPITOR ) 80 MG tablet TAKE 1 TABLET (80 MG TOTAL) BY MOUTH DAILY AT 6PM   icosapent  Ethyl (VASCEPA ) 1 g capsule TAKE 2 CAPSULES BY MOUTH 2 TIMES DAILY.   indapamide  (LOZOL ) 1.25 MG tablet TAKE 1 TABLET BY MOUTH DAILY.   insulin  glargine, 2 Unit Dial, (TOUJEO  MAX SOLOSTAR) 300 UNIT/ML Solostar Pen INJECT 20 UNITS INTO THE SKIN DAILY   irbesartan  (AVAPRO ) 300 MG tablet TAKE 1 TABLET BY MOUTH EVERY DAY   JARDIANCE  10 MG TABS tablet TAKE 1 TABLET BY MOUTH DAILY BEFORE BREAKFAST.   metFORMIN  (GLUCOPHAGE -XR) 750 MG 24 hr tablet Take 1 tablet (750 mg total) by mouth daily with breakfast.   nitroGLYCERIN   (NITROSTAT ) 0.4 MG SL tablet Place 1 tablet (0.4 mg total) under the tongue every 5 (five) minutes as needed for chest pain.   PARoxetine  (PAXIL ) 40 MG tablet TAKE 1 TABLET BY MOUTH EVERY DAY   tadalafil  (CIALIS ) 20 MG tablet Take 1 tablet (20 mg total) by mouth daily as needed for erectile dysfunction.   terazosin  (HYTRIN ) 2 MG capsule TAKE 1 CAPSULE BY MOUTH EVERYDAY AT BEDTIME    Studies Reviewed: Justin Galvan   EKG Interpretation Date/Time:  Tuesday January 14 2024 08:19:07 EDT Ventricular Rate:  72 PR Interval:  152 QRS Duration:  110 QT Interval:  386 QTC Calculation: 422 R Axis:   8  Text Interpretation: Normal sinus rhythm Inferior infarct , age undetermined When compared with ECG of 02-Aug-2020 12:27, PREVIOUS ECG IS PRESENT Confirmed by Anner Lenis ([id number]) on 01/14/2024 8:35:17 AM    Lab Results  Component Value Date   CHOL 137 09/23/2023   HDL 41.30 09/23/2023  LDLCALC 70 09/23/2023   LDLDIRECT 138.0 04/18/2021   TRIG 130.0 09/23/2023   CHOLHDL 3 09/23/2023   Lab Results  Component Value Date   NA 140 09/23/2023   CL 102 09/23/2023   K 3.8 09/23/2023   CO2 27 09/23/2023   BUN 17 09/23/2023   CREATININE 0.99 09/23/2023   GFR 80.05 09/23/2023   CALCIUM  9.5 09/23/2023   PHOS 3.1 08/03/2020   ALBUMIN 4.5 09/23/2023   GLUCOSE 124 (H) 09/23/2023   Lab Results  Component Value Date   HGBA1C 8.0 (H) 09/23/2023  BNP 14  CARDIAC STUDIES Myoview  10/08/2023: EF 45 to 54% (51%) large size mid to basal inferior fixed defect consistent with prior infarct.  No ischemia.  INTERMEDIATE RISK due to size of infarct.  Previous Studies Cath-PCI 09/06/2010: INFERIOR STEMI-mid RCA occlusion after proximal stent. => Thrombectomy with ISR PTCA (3.0 x 22 mm.) CATH (04/27/2015: Two-vessel CAD.  Patent LAD stent.  Mid LAD has segmental 20% stenosis involving 30% ostial D2.  Severe disease in small jailed D1 (90%) - Too small for PCI.  Proximal RCA stent 50% ISR followed by CTO of mid RCA with  left-to-right collaterals.  Mild segmental LV dysfunction.  (Dr. Verlin)   Myoview  October 2020: EF estimated 30 to 44% (37%).  Large sized severe intensity basal-apical inferoseptal inferior and inferolateral and apical septal infarct.  Findings consistent with prior infarct with peri-infarct ischemia. Echo (08/03/2020): EF 50 to 55%.  Basal to mid inferior hypokinesis.  GR 1 DD.  Calcific aortic valve with mild sclerosis no stenosis.  Mildly elevated RAP 8 mmHg. => EF with inferior hypokinesis stable from previous echo in 2016.   Risk Assessment/Calculations:     HYPERTENSION CONTROL Vitals:   01/14/24 0814 01/14/24 2002  BP: (!) 180/86 (!) 160/84    The patient's blood pressure is elevated above target today.  In order to address the patient's elevated BP: Blood pressure will be monitored at home to determine if medication changes need to be made.; A new medication was prescribed today.; The blood pressure is usually elevated in clinic.  Blood pressures monitored at home have been optimal. (Adding carvedilol  3.125 mg twice daily along with Imdur  30 mg daily)           Physical Exam:   VS:  BP (!) 160/84   Pulse 72   Ht 5' 7 (1.702 m)   Wt 177 lb (80.3 kg)   SpO2 97%   BMI 27.72 kg/m    Wt Readings from Last 3 Encounters:  01/14/24 177 lb (80.3 kg)  09/23/23 174 lb 3.2 oz (79 kg)  02/21/23 173 lb 12.8 oz (78.8 kg)      GEN: Well nourished, well groomed in no acute distress; healthy-appearing NECK: No JVD; No carotid bruits CARDIAC: Normal S1, S2; RRR, no murmurs, rubs, gallops RESPIRATORY:  Clear to auscultation without rales, wheezing or rhonchi ; nonlabored, good air movement. ABDOMEN: Soft, non-tender, non-distended EXTREMITIES:  No edema; No deformity      ASSESSMENT AND PLAN: .    Problem List Items Addressed This Visit       Cardiology Problems   CAD, NATIVE VESSEL, Hx. stent-RCA 1997; 2 Taxusprox & mid RCA 06/2003, cath 05/06/13 100% RCA, mid LAD disease  (Chronic)   Known two-vessel Coronary artery disease with occluded right coronary artery and patent LAD stent. Stress tests show inferior infarct without new ischemia. Daily exertional symptoms relieved by nitroglycerin , indicating angina due to supply-demand mismatch, likely from right  coronary artery blockage. On maximum irbesartan  and nitroglycerin . -Continue Avapro  30 mg daily along with 80 mg atorvastatin  and Vascepa . -Continue aspirin  81 mg daily -Add Imdur  30 mg daily along with carvedilol  3.125 mg twice daily for additional blood pressure control and antianginal benefit. - Schedule cardiac catheterization with possible PCI with Dr. Verlin on September 30th.      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  Concern would be is that the catheter be an issue with the LAD stent or potentially LCx which would lead to reduction in left-to-right collateralization. He did have a stress test that was nonischemic however symptoms are very concerning and can be potentially equivocal.  He also has hypertension which could be contributing. - Start long-acting nitroglycerin  (Imdur  30 mg) for symptom management. - Initiate carvedilol  3.125 mg twice daily to lower heart rate and blood pressure. - Monitor blood pressure, maintain 120/70 mmHg to reduce angina risk.  With continued symptoms, despite nonischemic Myoview , I feel obligated to proceed with invasive evaluation and cardiac catheterization and possible PCI.  I do not have Cath Lab availability in the next few days, I will therefore schedule him for cardiac catheterization with Dr.  Verlin on September 30.  Dr. Verlin has performed at least one of the PCI catheterizations as well as the most recent catheterization.  See Shared Decision Making/Informed Consent Statement below      Relevant Medications   isosorbide  mononitrate (IMDUR ) 30 MG 24 hr tablet   carvedilol  (COREG ) 3.125 MG tablet   Essential hypertension (Chronic)   Poorly controlled blood pressure today on irbesartan  300 mg daily and Hytrin  2 mg.  He is also on Lozol  1.25 mg daily. Carvedilol  addition planned to lower heart rate and blood pressure, aiming for 120s systolic to reduce angina ris Adding Imdur  30 mg daily for antianginal Not on beta-blocker, and with concerns for angina we will add carvedilol  3.25 mg twice daily.   Can titrate this up as long as blood pressure and heart rate tolerate.   Next medication will be amlodipine  for antianginal benefit.      Relevant Medications   isosorbide  mononitrate (IMDUR ) 30 MG 24 hr tablet   carvedilol  (COREG ) 3.125 MG tablet   Hyperlipidemia with target low density lipoprotein (LDL) cholesterol less than 55 mg/dL (Chronic)   Most recent LDL was 70 as of June.  This is the first cutoff for target, but would actually like to see less than 55 with ongoing symptoms. For now we will continue atorvastatin  80 mg daily and Vascepa  2 g twice daily  -- Low threshold to consider adding ezetimibe  10 mg plus or minus bempedoic acid 180 mg daily      Relevant Medications   isosorbide  mononitrate (IMDUR ) 30 MG 24 hr tablet   carvedilol  (COREG ) 3.125 MG tablet   Progressive angina (HCC) (Chronic)   I am concerned that his symptoms are occurring with more frequency and more intensity.  He had a negative stress test 3 months ago but with known occluded RCA and collateral flow, there is also possibility of balanced ischemia. Will plan for cardiac catheterization. Will add beta-blocker 3.125  mg carvedilol  twice daily as well as Imdur  30 mg daily.      Relevant  Medications   isosorbide  mononitrate (IMDUR ) 30 MG 24 hr tablet   carvedilol  (COREG ) 3.125 MG tablet   Other Relevant Orders   CBC   Basic metabolic panel with GFR     Other   Grade I diastolic dysfunction   Diastolic strain noted on echo which is not unexpected in the setting of CAD and MI.  Also now with elevated elevated high blood pressure, it is possible that symptoms could simply be related to ischemia from microvascular disease.  Need to lower blood pressure-adding carvedilol  3.125 mg twice daily.  Will also add Imdur , but low threshold to consider amlodipine  additionally for potential microvascular benefit. He is already on high-dose irbesartan , along with Lozol  and Hytrin       Insulin -requiring or dependent type II diabetes mellitus (HCC) (Chronic)   Not at goal with an A1c of 8.0.  Defer to PCP.   He is already on SGLT2 inhibitor, metformin  along with insulin .      Pre-procedure lab exam   Labs checked for cardiac catheterization.      Relevant Orders   CBC   Basic metabolic panel with GFR       Cardiac Catheterization planned with Dr. Verlin on September 30. Informed Consent   Shared Decision Making/Informed Consent The risks [stroke (1 in 1000), death (1 in 1000), kidney failure [usually temporary] (1 in 500), bleeding (1 in 200), allergic reaction [possibly serious] (1 in 200)], benefits (diagnostic support and management of coronary artery disease) and alternatives of a cardiac catheterization were discussed in detail with Mr. Rebstock and he is willing to proceed.      Follow-Up: Return in about 3 weeks (around 02/04/2024) for Post cath visit with APP.   I spent 77 minutes in the care of BION TODOROV today including reviewing labs (1 minute), reviewing studies (multiple tests including several cath films, Myoview 's and echocardiograms reviewed-17 minutes), face to face time discussing treatment options (34 minutes), reviewing records from last note from Dr.  Barbaraann, discharge summary from last PCI and cardiac catheterization as well as note from PCP (10 minutes), 15 minutes dictating, and documenting in the encounter.        Signed, Alm MICAEL Clay, MD, MS Alm Clay, M.D., M.S. Interventional Cardiologist  [facility] Pager # [phone]

## 2024-01-14 NOTE — Assessment & Plan Note (Signed)
 Known two-vessel Coronary artery disease with occluded right coronary artery and patent LAD stent. Stress tests show inferior infarct without new ischemia. Daily exertional symptoms relieved by nitroglycerin , indicating angina due to supply-demand mismatch, likely from right coronary artery blockage. On maximum irbesartan  and nitroglycerin . -Continue Avapro  30 mg daily along with 80 mg atorvastatin  and Vascepa . -Continue aspirin  81 mg daily -Add Imdur  30 mg daily along with carvedilol  3.125 mg twice daily for additional blood pressure control and antianginal benefit. - Schedule cardiac catheterization with possible PCI with Dr. Verlin on September 30th.

## 2024-01-14 NOTE — Assessment & Plan Note (Signed)
 Not at goal with an A1c of 8.0.  Defer to PCP.   He is already on SGLT2 inhibitor, metformin  along with insulin .

## 2024-01-14 NOTE — Assessment & Plan Note (Signed)
 Most recent LDL was 70 as of June.  This is the first cutoff for target, but would actually like to see less than 55 with ongoing symptoms. For now we will continue atorvastatin  80 mg daily and Vascepa  2 g twice daily  -- Low threshold to consider adding ezetimibe  10 mg plus or minus bempedoic acid 180 mg daily

## 2024-01-14 NOTE — Assessment & Plan Note (Signed)
 I am concerned that his symptoms are occurring with more frequency and more intensity.  He had a negative stress test 3 months ago but with known occluded RCA and collateral flow, there is also possibility of balanced ischemia. Will plan for cardiac catheterization. Will add beta-blocker 3.125 mg carvedilol  twice daily as well as Imdur  30 mg daily.

## 2024-01-14 NOTE — Assessment & Plan Note (Signed)
 He has had known CAD with RCA occlusion dating back to 2017 but is now presenting with symptoms that are concerning for progressive angina happening on a daily basis with now less exertion.  Concern would be is that the catheter be an issue with the LAD stent or potentially LCx which would lead to reduction in left-to-right collateralization. He did have a stress test that was nonischemic however symptoms are very concerning and can be potentially equivocal.  He also has hypertension which could be contributing. - Start long-acting nitroglycerin  (Imdur  30 mg) for symptom management. - Initiate carvedilol  3.125 mg twice daily to lower heart rate and blood pressure. - Monitor blood pressure, maintain 120/70 mmHg to reduce angina risk.  With continued symptoms, despite nonischemic Myoview , I feel obligated to proceed with invasive evaluation and cardiac catheterization and possible PCI.  I do not have Cath Lab availability in the next few days, I will therefore schedule him for cardiac catheterization with Dr. Verlin on September 30.  Dr. Verlin has performed at least one of the PCI catheterizations as well as the most recent catheterization.  See Shared Decision Making/Informed Consent Statement below

## 2024-01-14 NOTE — Assessment & Plan Note (Signed)
 Diastolic strain noted on echo which is not unexpected in the setting of CAD and MI.  Also now with elevated elevated high blood pressure, it is possible that symptoms could simply be related to ischemia from microvascular disease.  Need to lower blood pressure-adding carvedilol  3.125 mg twice daily.  Will also add Imdur , but low threshold to consider amlodipine  additionally for potential microvascular benefit. He is already on high-dose irbesartan , along with Lozol  and Hytrin 

## 2024-01-14 NOTE — Assessment & Plan Note (Addendum)
 Poorly controlled blood pressure today on irbesartan  300 mg daily and Hytrin  2 mg.  He is also on Lozol  1.25 mg daily. Carvedilol  addition planned to lower heart rate and blood pressure, aiming for 120s systolic to reduce angina ris Adding Imdur  30 mg daily for antianginal Not on beta-blocker, and with concerns for angina we will add carvedilol  3.25 mg twice daily.   Can titrate this up as long as blood pressure and heart rate tolerate.   Next medication will be amlodipine  for antianginal benefit.

## 2024-01-15 LAB — CBC
Hematocrit: 47.3 % (ref 37.5–51.0)
Hemoglobin: 15.5 g/dL (ref 13.0–17.7)
MCH: 29.4 pg (ref 26.6–33.0)
MCHC: 32.8 g/dL (ref 31.5–35.7)
MCV: 90 fL (ref 79–97)
Platelets: 218 x10E3/uL (ref 150–450)
RBC: 5.27 x10E6/uL (ref 4.14–5.80)
RDW: 13.2 % (ref 11.6–15.4)
WBC: 6.8 x10E3/uL (ref 3.4–10.8)

## 2024-01-15 LAB — BASIC METABOLIC PANEL WITH GFR
BUN/Creatinine Ratio: 23 (ref 10–24)
BUN: 21 mg/dL (ref 8–27)
CO2: 21 mmol/L (ref 20–29)
Calcium: 9.5 mg/dL (ref 8.6–10.2)
Chloride: 99 mmol/L (ref 96–106)
Creatinine, Ser: 0.92 mg/dL (ref 0.76–1.27)
Glucose: 136 mg/dL — ABNORMAL HIGH (ref 70–99)
Potassium: 4.1 mmol/L (ref 3.5–5.2)
Sodium: 141 mmol/L (ref 134–144)
eGFR: 92 mL/min/1.73 (ref >59)

## 2024-01-20 ENCOUNTER — Telehealth: Payer: Self-pay | Admitting: Cardiology

## 2024-01-20 ENCOUNTER — Telehealth: Payer: Self-pay | Admitting: *Deleted

## 2024-01-20 NOTE — Telephone Encounter (Addendum)
 Cardiac Catheterization scheduled at [facility] for: Tuesday January 21, 2024 10:30 AM Arrival time [facility] Main Entrance A at: 8:30 AM  Diet: -Nothing to eat after midnight.  Hydration: -May drink clear liquids until 2 hours before the procedure.  Approved liquids: Water, clear tea, black coffee, fruit juices-non-citric and without pulp,Gatorade, plain Jello/popsicles.   -Please drink 16  oz of water 2 hours before procedure.  Medication instructions: -Hold:  Metformin -day of procedure and 48 hours after procedure  Insulin -AM of procedure  Jardiance -AM of procedure -Other usual morning medications can be taken including aspirin  81 mg.  Plan to go home the same day, you will only stay overnight if medically necessary.  You must have responsible adult to drive you home.  Someone must be with you the first 24 hours after you arrive home.  Reviewed procedure instructions with patient. Patient aware it is okay to be dropped off and picked up when ready for discharge.

## 2024-01-20 NOTE — Telephone Encounter (Signed)
 Wife would like to know how long heart cath procedure will take. She says she plans to drop patient off at the hospital then go back to the hospital to pick him up later depending on how long it takes.

## 2024-01-20 NOTE — Telephone Encounter (Signed)
 2nd attempt: Left message  1st attempt: Call picked on but no one answered on the other end.

## 2024-01-21 ENCOUNTER — Ambulatory Visit (HOSPITAL_COMMUNITY)
Admission: RE | Admit: 2024-01-21 | Discharge: 2024-01-21 | Disposition: A | Discharge status: Home / Self Care | Attending: Cardiovascular Disease | Admitting: Cardiovascular Disease

## 2024-01-21 ENCOUNTER — Ambulatory Visit: Payer: Self-pay | Admitting: Cardiovascular Disease

## 2024-01-21 ENCOUNTER — Ambulatory Visit (HOSPITAL_BASED_OUTPATIENT_CLINIC_OR_DEPARTMENT_OTHER)

## 2024-01-21 ENCOUNTER — Encounter (HOSPITAL_COMMUNITY)
Admission: RE | Disposition: A | Discharge status: Home / Self Care | Payer: Self-pay | Source: Home / Self Care | Attending: Cardiovascular Disease

## 2024-01-21 ENCOUNTER — Other Ambulatory Visit: Payer: Self-pay

## 2024-01-21 ENCOUNTER — Encounter (HOSPITAL_COMMUNITY): Payer: Self-pay | Admitting: Cardiovascular Disease

## 2024-01-21 ENCOUNTER — Ambulatory Visit: Payer: Self-pay | Admitting: Cardiology

## 2024-01-21 DIAGNOSIS — E119 Type 2 diabetes mellitus without complications: Secondary | ICD-10-CM | POA: Insufficient documentation

## 2024-01-21 DIAGNOSIS — Z955 Presence of coronary angioplasty implant and graft: Secondary | ICD-10-CM | POA: Insufficient documentation

## 2024-01-21 DIAGNOSIS — Z79899 Other long term (current) drug therapy: Secondary | ICD-10-CM | POA: Insufficient documentation

## 2024-01-21 DIAGNOSIS — Z7982 Long term (current) use of aspirin: Secondary | ICD-10-CM | POA: Insufficient documentation

## 2024-01-21 DIAGNOSIS — I119 Hypertensive heart disease without heart failure: Secondary | ICD-10-CM | POA: Insufficient documentation

## 2024-01-21 DIAGNOSIS — I2510 Atherosclerotic heart disease of native coronary artery without angina pectoris: Secondary | ICD-10-CM | POA: Diagnosis not present

## 2024-01-21 DIAGNOSIS — I255 Ischemic cardiomyopathy: Secondary | ICD-10-CM | POA: Diagnosis not present

## 2024-01-21 DIAGNOSIS — E785 Hyperlipidemia, unspecified: Secondary | ICD-10-CM | POA: Insufficient documentation

## 2024-01-21 DIAGNOSIS — I252 Old myocardial infarction: Secondary | ICD-10-CM | POA: Diagnosis not present

## 2024-01-21 DIAGNOSIS — Z006 Encounter for examination for normal comparison and control in clinical research program: Secondary | ICD-10-CM

## 2024-01-21 DIAGNOSIS — Z794 Long term (current) use of insulin: Secondary | ICD-10-CM | POA: Diagnosis not present

## 2024-01-21 DIAGNOSIS — I429 Cardiomyopathy, unspecified: Secondary | ICD-10-CM | POA: Diagnosis not present

## 2024-01-21 DIAGNOSIS — I25110 Atherosclerotic heart disease of native coronary artery with unstable angina pectoris: Secondary | ICD-10-CM | POA: Diagnosis present

## 2024-01-21 DIAGNOSIS — Z7984 Long term (current) use of oral hypoglycemic drugs: Secondary | ICD-10-CM | POA: Diagnosis not present

## 2024-01-21 DIAGNOSIS — I2582 Chronic total occlusion of coronary artery: Secondary | ICD-10-CM | POA: Insufficient documentation

## 2024-01-21 DIAGNOSIS — Z87891 Personal history of nicotine dependence: Secondary | ICD-10-CM | POA: Diagnosis not present

## 2024-01-21 HISTORY — PX: LEFT HEART CATH AND CORONARY ANGIOGRAPHY: CATH118249

## 2024-01-21 LAB — ECHOCARDIOGRAM COMPLETE
Area-P 1/2: 2.93 cm2
Calc EF: 56.1 %
Height: 67 in
S' Lateral: 4.2 cm
Single Plane A2C EF: 62.2 %
Single Plane A4C EF: 53.2 %
Weight: 2832 [oz_av]

## 2024-01-21 LAB — GLUCOSE, CAPILLARY
Glucose-Capillary: 186 mg/dL — ABNORMAL HIGH (ref 70–99)
Glucose-Capillary: 174 mg/dL — ABNORMAL HIGH (ref 70–99)

## 2024-01-21 SURGERY — LEFT HEART CATH AND CORONARY ANGIOGRAPHY
Anesthesia: LOCAL

## 2024-01-21 MED ORDER — HEPARIN SODIUM (PORCINE) 1000 UNIT/ML IJ SOLN
INTRAMUSCULAR | Status: DC | PRN
  Administered 2024-01-21: 4000 [IU] via INTRAVENOUS

## 2024-01-21 MED ORDER — HEPARIN (PORCINE) IN NACL 1000-0.9 UT/500ML-% IV SOLN
INTRAVENOUS | Status: DC | PRN
  Administered 2024-01-21: 1000 mL

## 2024-01-21 MED ORDER — SODIUM CHLORIDE 0.9% FLUSH: 3.0000 mL | INTRAVENOUS | Status: DC | PRN

## 2024-01-21 MED ORDER — SODIUM CHLORIDE 0.9% FLUSH: 3.0000 mL | Freq: Two times a day (BID) | INTRAVENOUS | Status: DC

## 2024-01-21 MED ORDER — SODIUM CHLORIDE 0.9 % IV SOLN: 250.0000 mL | INTRAVENOUS | Status: DC | PRN

## 2024-01-21 MED ORDER — MIDAZOLAM HCL 2 MG/2ML IJ SOLN
INTRAMUSCULAR | Status: AC
  Filled 2024-01-21: qty 2

## 2024-01-21 MED ORDER — IOHEXOL 350 MG/ML SOLN
INTRAVENOUS | Status: DC | PRN
  Administered 2024-01-21: 45 mL

## 2024-01-21 MED ORDER — LIDOCAINE HCL (PF) 1 % IJ SOLN
INTRAMUSCULAR | Status: DC | PRN
  Administered 2024-01-21: 2 mL via INTRADERMAL

## 2024-01-21 MED ORDER — MIDAZOLAM HCL 2 MG/2ML IJ SOLN
INTRAMUSCULAR | Status: DC | PRN
  Administered 2024-01-21: 2 mg via INTRAVENOUS

## 2024-01-21 MED ORDER — VERAPAMIL HCL 2.5 MG/ML IV SOLN
INTRAVENOUS | Status: AC
  Filled 2024-01-21: qty 2

## 2024-01-21 MED ORDER — LABETALOL HCL 5 MG/ML IV SOLN: 10.0000 mg | INTRAVENOUS | Status: DC | PRN

## 2024-01-21 MED ORDER — FREE WATER: 500.0000 mL | Freq: Once | Status: DC

## 2024-01-21 MED ORDER — ACETAMINOPHEN 325 MG PO TABS: 650.0000 mg | ORAL_TABLET | ORAL | Status: DC | PRN

## 2024-01-21 MED ORDER — VERAPAMIL HCL 2.5 MG/ML IV SOLN
INTRAVENOUS | Status: DC | PRN
  Administered 2024-01-21: 10 mL via INTRA_ARTERIAL

## 2024-01-21 MED ORDER — FENTANYL CITRATE (PF) 100 MCG/2ML IJ SOLN
INTRAMUSCULAR | Status: AC
  Filled 2024-01-21: qty 2

## 2024-01-21 MED ORDER — LIDOCAINE HCL (PF) 1 % IJ SOLN
INTRAMUSCULAR | Status: AC
  Filled 2024-01-21: qty 30

## 2024-01-21 MED ORDER — FENTANYL CITRATE (PF) 100 MCG/2ML IJ SOLN
INTRAMUSCULAR | Status: DC | PRN
  Administered 2024-01-21: 50 ug via INTRAVENOUS

## 2024-01-21 MED ORDER — HEPARIN SODIUM (PORCINE) 1000 UNIT/ML IJ SOLN
INTRAMUSCULAR | Status: AC
  Filled 2024-01-21: qty 10

## 2024-01-21 MED ORDER — ONDANSETRON HCL 4 MG/2ML IJ SOLN: 4.0000 mg | Freq: Four times a day (QID) | INTRAMUSCULAR | Status: DC | PRN

## 2024-01-21 MED ORDER — HYDRALAZINE HCL 20 MG/ML IJ SOLN: 10.0000 mg | INTRAMUSCULAR | Status: DC | PRN

## 2024-01-21 MED ORDER — ASPIRIN 81 MG PO CHEW
81.0000 mg | CHEWABLE_TABLET | ORAL | Status: DC
Start: 2024-01-21 — End: 2024-01-21

## 2024-01-21 SURGICAL SUPPLY — 6 items
CATH 5FR JL3.5 JR4 ANG PIG MP (CATHETERS) IMPLANT
DEVICE RAD COMP TR BAND LRG (VASCULAR PRODUCTS) IMPLANT
GLIDESHEATH SLEND SS 6F .021 (SHEATH) IMPLANT
GUIDEWIRE INQWIRE 1.5J.035X260 (WIRE) IMPLANT
PACK CARDIAC CATHETERIZATION (CUSTOM PROCEDURE TRAY) ×2 IMPLANT
SET ATX-X65L (MISCELLANEOUS) IMPLANT

## 2024-01-21 NOTE — Research (Signed)
 [facility] Informed Consent   Subject Name: Justin Galvan  Subject met inclusion and exclusion criteria.  The informed consent form, study requirements and expectations were reviewed with the subject and questions and concerns were addressed prior to the signing of the consent form.  The subject verbalized understanding of the trial requirements.  The subject agreed to participate in the [facility] trial and signed the informed consent at 0820 on 01/21/2024.  The informed consent was obtained prior to performance of any protocol-specific procedures for the subject.  A copy of the signed informed consent was given to the subject and a copy was placed in the subject's medical record.   Gordana Kewley

## 2024-01-21 NOTE — Interval H&P Note (Signed)
 History and Physical Interval Note:  01/21/2024 9:00 AM  Ozell JAYSON Heys  has presented today for surgery, with the diagnosis of abnormal myoview .  The various methods of treatment have been discussed with the patient and family. After consideration of risks, benefits and other options for treatment, the patient has consented to  Procedure(s): LEFT HEART CATH AND CORONARY ANGIOGRAPHY (N/A) as a surgical intervention.  The patient's history has been reviewed, patient examined, no change in status, stable for surgery.  I have reviewed the patient's chart and labs.  Questions were answered to the patient's satisfaction.    Cath Lab Visit (complete for each Cath Lab visit)  Clinical Evaluation Leading to the Procedure:   ACS: No  Non-ACS:    Anginal Classification: CCS III  Anti-ischemic medical therapy: Maximal Therapy (2 or more classes of medications)  Non-Invasive Test Results: Intermediate-risk stress test findings: cardiac mortality 1-3%/year  Prior CABG: No previous CABG        Lonni Cash

## 2024-01-21 NOTE — Discharge Instructions (Signed)
 Radial Site Care  This sheet gives you information about how to care for yourself after your procedure. Your health care provider may also give you more specific instructions. If you have problems or questions, contact your health care provider. What can I expect after the procedure? After the procedure, it is common to have: Bruising and tenderness at the catheter insertion area. Follow these instructions at home: Medicines Take over-the-counter and prescription medicines only as told by your health care provider. Insertion site care Follow instructions from your health care provider about how to take care of your insertion site. Make sure you: Wash your hands with soap and water before you remove your bandage (dressing). If soap and water are not available, use hand sanitizer. May remove dressing in 24 hours. Check your insertion site every day for signs of infection. Check for: Redness, swelling, or pain. Fluid or blood. Pus or a bad smell. Warmth. Do no take baths, swim, or use a hot tub for 5 days. You may shower 24-48 hours after the procedure. Remove the dressing and gently wash the site with plain soap and water. Pat the area dry with a clean towel. Do not rub the site. That could cause bleeding. Do not apply powder or lotion to the site. Activity  For 24 hours after the procedure, or as directed by your health care provider: Do not flex or bend the affected arm. Do not push or pull heavy objects with the affected arm. Do not drive yourself home from the hospital or clinic. You may drive 24 hours after the procedure. Do not operate machinery or power tools. KEEP ARM ELEVATED THE REMAINDER OF THE DAY. Do not push, pull or lift anything that is heavier than 10 lb for 5 days. Ask your health care provider when it is okay to: Return to work or school. Resume usual physical activities or sports. Resume sexual activity. General instructions If the catheter site starts to  bleed, raise your arm and put firm pressure on the site. If the bleeding does not stop, get help right away. This is a medical emergency. DRINK PLENTY OF FLUIDS FOR THE NEXT 2-3 DAYS. No alcohol consumption for 24 hours after receiving sedation. If you went home on the same day as your procedure, a responsible adult should be with you for the first 24 hours after you arrive home. Keep all follow-up visits as told by your health care provider. This is important. Contact a health care provider if: You have a fever. You have redness, swelling, or yellow drainage around your insertion site. Get help right away if: You have unusual pain at the radial site. The catheter insertion area swells very fast. The insertion area is bleeding, and the bleeding does not stop when you hold steady pressure on the area. Your arm or hand becomes pale, cool, tingly, or numb. These symptoms may represent a serious problem that is an emergency. Do not wait to see if the symptoms will go away. Get medical help right away. Call your local emergency services (911 in the [place] Do not drive yourself to the hospital. Summary After the procedure, it is common to have bruising and tenderness at the site. Follow instructions from your health care provider about how to take care of your radial site wound. Check the wound every day for signs of infection.  This information is not intended to replace advice given to you by your health care provider. Make sure you discuss any questions you have with  your health care provider. Document Revised: 05/15/2017 Document Reviewed: 05/15/2017 Elsevier Patient Education  2020 [employer].

## 2024-01-21 NOTE — Progress Notes (Signed)
 Echocardiogram 2D Echocardiogram has been performed.  Damien FALCON Ifeoluwa Bartz RDCS 01/21/2024, 12:19 PM

## 2024-01-22 NOTE — H&P (View-Only) (Signed)
 [address], [place] [address]             [phone]    Justin Galvan [facility] Medical Record #[id number] Date of Birth: 07-16-1960  Referring: Verlin Lonni BIRCH* Primary Care: Joshua Debby CROME, MD Primary Cardiologist:David Anner, MD  Chief Complaint:    Chief Complaint  Patient presents with   Coronary Artery Disease    Surgical consult, Cardiac Cath and ECHO 01/21/24    History of Present Illness:     Justin Galvan is a 63 y.o. male who presents for surgical evaluation of multivessel coronary artery disease.  He was 63 years old when he had his first MI - at that time he had neck burning then head burning that then radiated down his left arm.  He had another heart attack at 70 and was doing well but for the last 3 months has been having more pain.  He is taking nitroglycerin  1-3 times a day and tried taking Imdur  but had severe headaches (even when taking at night) so he has subsequently stopped.  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  When the weather was very hot, he would have a lot more pain as it was exacerbated by heat.  He feels better recently now that the weather has cooled down.  He is still working every day.  He is right hand dominant.  His blood pressure is moderately well controlled, it was high today but he says it's usually SBP 150s at home.  He denies syncope, palpitations, leg swelling, orthopnea or dizziness.  He is a former 60 pack year smoker, and quit in 1998.  He drinks 1 alcoholic beverage a month. No strokes.   PMH: CAD s/p RCA stent, mid LAD stent, HTN, HL, DM2 (A1c 8.0% June 2025), BPH  TTE (01/21/24): Low normal EF (45-50%), normal RV function, no valvular disease LHC (01/20/34): Lots of RCA stents - CTO, Mid to distal LM 70%, 99% ostial circ  CT chest (10/08/23): Not a lot of calcium , normal sized aorta, lungs looks good  Past Medical and Surgical History: Previous Chest Surgery: No Previous  Chest Radiation: No Diabetes Mellitus: Yes.  HbA1C 8.0 Anticoagulation: No, Last dose n/a  Creatinine:  Lab Results  Component Value Date   CREATININE 0.92 01/14/2024   CREATININE 0.99 09/23/2023   CREATININE 0.91 02/21/2023     Past Medical History:  Diagnosis Date   Anginal pain    Arthritis    fingers (04/22/2015)   BPH (benign prostatic hypertrophy)    CAD, NATIVE VESSEL, Hx. 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) 08/19/2008   DM2 (diabetes mellitus, type 2) (HCC)    Erectile dysfunction due to arterial insufficiency 10/10/2016   Essential hypertension 08/19/2008   HLD (hyperlipidemia)    HTN (hypertension)    Hypertriglyceridemia 05/03/2009        Kidney stones    MI (myocardial infarction) (HCC) 1997; ~ 2002; 04/2013   MRSA carrier 11/20/2016   S/P coronary artery stent placement -mid LAD, 05/06/13 Promus Premier DES 05/07/2013    Past Surgical History:  Procedure Laterality Date   CARDIAC CATHETERIZATION N/A 04/26/2015   Procedure: Left Heart Cath and Coronary Angiography;  Surgeon: Lonni JONETTA Cash, MD;  Location: [facility] INVASIVE CV LAB;  Service: Cardiovascular;  Laterality: N/A;   CORONARY ANGIOPLASTY WITH STENT PLACEMENT  1997   got several stents; ~8 from 1997 to January 2015; 2 stent segments in the RCA, and single proximal-mid LAD stent jailing D1   LEFT HEART CATH AND CORONARY ANGIOGRAPHY N/A 01/21/2024   Procedure: LEFT HEART CATH AND CORONARY ANGIOGRAPHY;  Surgeon: Cash Lonni JONETTA, MD;  Location: [facility] INVASIVE CV LAB;  Service: Cardiovascular;  Laterality: N/A;   LEFT HEART CATHETERIZATION WITH CORONARY ANGIOGRAM N/A 05/06/2013   Procedure: LEFT HEART CATHETERIZATION WITH CORONARY ANGIOGRAM;  Surgeon: Lonni JONETTA Cash, MD;  Location: [facility] CATH LAB;   Service: CV:;; Two-vessel CAD.  Patent LAD stent.  Mid LAD has segmental 20% stenosis involving 30% ostial D2.  Severe disease in small jailed D1 (90%) - Too small for PCI.  Proximal RCA stent 50% ISR -& CTO of mid RCA W/L-to-R collaterals.  Mild segmental LV dysfxn.    Social History:  Social History   Tobacco Use  Smoking Status Former   Current packs/day: 0.00   Average packs/day: 2.8 packs/day for 22.0 years (60.5 ttl pk-yrs)   Types: Cigarettes   Start date: 09/08/1974   Quit date: 09/07/1996   Years since quitting: 27.3  Smokeless Tobacco Never    Social History   Substance and Sexual Activity  Alcohol Use Yes   Comment: 04/22/2015 might drink a 6 pack of beer/year     Allergies  Allergen Reactions   Semaglutide (0.25 Or 0.5mg -Dos) Nausea And Vomiting    Medications: Asprin: yes Statin: yes Beta Blocker: yes Ace Inhibitor: yes Anti-Coagulation: no  Current Outpatient Medications  Medication Sig Dispense Refill   ASPIRIN  LOW DOSE 81 MG tablet TAKE 1 TABLET (81 MG TOTAL) BY MOUTH DAILY. SWALLOW WHOLE. 30 tablet 5   atorvastatin  (LIPITOR ) 80 MG tablet TAKE 1 TABLET (80 MG TOTAL) BY MOUTH DAILY AT 6PM 90 tablet 1   carvedilol  (COREG ) 3.125 MG tablet Take 1 tablet (3.125 mg total) by mouth 2 (two) times daily. 180 tablet 3   Continuous Glucose Sensor (FREESTYLE LIBRE 3 PLUS SENSOR) MISC Apply 1 Act topically every 14 (fourteen) days. Change sensor every 15 days. 6 each 1   icosapent  Ethyl (VASCEPA ) 1 g capsule TAKE 2 CAPSULES BY MOUTH 2 TIMES DAILY. 360 capsule 1   indapamide  (LOZOL ) 1.25 MG tablet TAKE 1 TABLET BY MOUTH DAILY. 30 tablet 2   insulin  glargine, 2 Unit Dial, (TOUJEO  MAX SOLOSTAR) 300 UNIT/ML Solostar Pen INJECT 20 UNITS INTO THE SKIN DAILY 3 mL 1   Insulin  Pen Needle 32G X 6 MM MISC 1 Act by Does not apply route daily. 100 each 1   irbesartan  (AVAPRO ) 300 MG tablet TAKE 1 TABLET BY MOUTH EVERY DAY 30 tablet 2   isosorbide  mononitrate (IMDUR ) 30 MG 24 hr  tablet Take 1 tablet (30 mg total) by mouth daily. 90 tablet 3   JARDIANCE  10 MG TABS tablet TAKE 1 TABLET BY MOUTH DAILY BEFORE BREAKFAST. 30 tablet 2   metFORMIN  (GLUCOPHAGE -XR) 750 MG 24 hr tablet Take 1 tablet (750 mg total) by mouth daily with breakfast. 180 tablet 0   nitroGLYCERIN  (NITROSTAT ) 0.4 MG SL tablet Place 1 tablet (0.4 mg total) under the tongue every 5 (five) minutes as needed for chest pain. 25 tablet 6   PARoxetine  (PAXIL ) 40 MG tablet TAKE 1 TABLET BY MOUTH EVERY DAY 30 tablet 2   tadalafil  (CIALIS ) 20 MG tablet Take  1 tablet (20 mg total) by mouth daily as needed for erectile dysfunction. 8 tablet 0   terazosin  (HYTRIN ) 2 MG capsule TAKE 1 CAPSULE BY MOUTH EVERYDAY AT BEDTIME 30 capsule 5   No current facility-administered medications for this visit.    (Not in a hospital admission)   Family History  Problem Relation Age of Onset   Heart disease Mother    Hypertension Other    Cancer Neg Hx    Diabetes Neg Hx    Early death Neg Hx    Hyperlipidemia Neg Hx    Kidney disease Neg Hx    Stroke Neg Hx    Alcohol abuse Neg Hx      Review of Systems:   Review of Systems  Constitutional:  Negative for chills, fever and weight loss.  Respiratory:  Negative for cough and shortness of breath.   Cardiovascular:  Positive for chest pain. Negative for palpitations, claudication and leg swelling.  Gastrointestinal:  Negative for nausea and vomiting.  Musculoskeletal:  Negative for falls.  Neurological:  Negative for dizziness and headaches.      Physical Exam: BP (!) 190/81   Pulse 82   Resp 18   Ht 5' 7 (1.702 m)   Wt 177 lb (80.3 kg)   SpO2 95% Comment: RA  BMI 27.72 kg/m  Physical Exam Constitutional:      Appearance: Normal appearance. He is normal weight.  HENT:     Head: Normocephalic and atraumatic.  Cardiovascular:     Rate and Rhythm: Normal rate.     Heart sounds: Murmur heard.     Comments: Passed Allen test. Pulmonary:     Effort:  Pulmonary effort is normal.     Breath sounds: Normal breath sounds.  Abdominal:     General: There is no distension.     Palpations: Abdomen is soft.     Tenderness: There is no abdominal tenderness.  Musculoskeletal:        General: No swelling.  Skin:    General: Skin is warm and dry.  Neurological:     General: No focal deficit present.     Mental Status: He is alert and oriented to person, place, and time.     + murmur  Diagnostic Studies & Laboratory data: Cardiac Studies & Procedures   ______________________________________________________________________________________________ CARDIAC CATHETERIZATION  CARDIAC CATHETERIZATION 01/21/2024  Conclusion   Ost 1st Diag to 1st Diag lesion is 90% stenosed.   Ost 2nd Diag lesion is 30% stenosed.   Mid LM to Dist LM lesion is 70% stenosed.   Prox RCA lesion is 90% stenosed.   Mid RCA lesion is 100% stenosed.   Ost Cx to Prox Cx lesion is 99% stenosed.   Mid LAD-1 lesion is 20% stenosed.   Mid LAD-2 lesion is 65% stenosed.   Mid Cx lesion is 50% stenosed.   1st Diag lesion is 80% stenosed.   3rd Diag lesion is 40% stenosed.  Severe distal left main disease Patent mid LAD stent. Moderately severe mid LAD stenosis Severe ostial Circumflex stenosis Chronic total occlusion of the mid RCA. The distal RCA, PDA and posterolateral artery fill from left to right collaterals. Mild LV systolic dysfunction with inferior wall hypokinesis.  Recommendations: Diabetic male with sever left main disease and three vessel CAD. LV function is mildly abnormal by LV gram. I think the best option for revascularization is bypass surgery. Will refer to CT surgery to consider bypass. Echo in holding area before discharge today.  Findings Coronary Findings Diagnostic  Dominance: Right  Left Main Mid LM to Dist LM lesion is 70% stenosed.  Left Anterior Descending Vessel is large. Mid LAD-1 lesion is 20% stenosed. The lesion was previously  treated using a drug eluting stent over 2 years ago. Mid LAD-2 lesion is 65% stenosed. The lesion is segmental.  First Diagonal Branch Vessel is small in size. Ost 1st Diag to 1st Diag lesion is 90% stenosed. The lesion is discrete. 1st Diag lesion is 80% stenosed.  Second Diagonal [employer] 2nd Diag lesion is 30% stenosed. The lesion is discrete.  Third Diagonal Branch 3rd Diag lesion is 40% stenosed.  Left Circumflex Vessel is moderate in size. Ost Cx to Prox Cx lesion is 99% stenosed. Mid Cx lesion is 50% stenosed.  First Obtuse Marginal Branch Vessel is small in size.  Second Obtuse Marginal Branch Vessel is moderate in size.  Right Coronary Artery Prox RCA lesion is 90% stenosed. The lesion was previously treated using a drug eluting stent over 2 years ago. Mid RCA lesion is 100% stenosed. The lesion was previously treated using a drug eluting stent over 2 years ago.  Right Posterior Descending Artery Collaterals RPDA filled by collaterals from 2nd Mrg.  Right Posterior Atrioventricular Artery Collaterals RPAV filled by collaterals from Dist Cx.  Intervention  No interventions have been documented.   CARDIAC CATHETERIZATION  CARDIAC CATHETERIZATION 04/26/2015  Conclusion 1. Double vessel CAD. ] 2. Patent stent mid LAD with no restenosis. 3. Severe disease very small diagonal branch at the ostium where the vessel is jailed by the LAD stent. Too small for PCI. 4. Chronic occlusion mid RCA. The distal vessel fills from left to right collaterals. 5. Mild segmental LV systolic dysfunction.  Recommendations. Continue medical management of CAD. Imdur  was added. Discharge home today. Follow up with me or office APP 2-3 weeks.  Findings Coronary Findings Diagnostic  Dominance: Right  Left Anterior Descending . Vessel is large. Previously placed Prox LAD to Mid LAD stent (unknown type) is patent. Diffuse.  First Diagonal Branch The vessel is small in  size. Discrete.  Second Personnel officer.  Left Circumflex . Vessel is moderate in size.  First Obtuse Marginal Branch The vessel is small in size.  Second Obtuse Marginal Branch The vessel is moderate in size.  Right Coronary Artery Diffuse. The lesion was previously treated with a stent (unknown type) greater than two years ago. The lesion was previously treated with a stent (unknown type) greater than two years ago.  Right Posterior Descending Artery Collaterals RPDA filled by collaterals from 2nd Mrg.  Right Posterior Atrioventricular Artery Collaterals RPAV filled by collaterals from Dist Cx.  Intervention  No interventions have been documented.   STRESS TESTS  MYOCARDIAL PERFUSION IMAGING 10/08/2023  Interpretation Summary   Findings are consistent with infarction. The study is intermediate risk.   No ST deviation was noted.   LV perfusion is abnormal. There is no evidence of ischemia. There is evidence of infarction. Defect 1: There is a large defect with severe reduction in uptake present in the mid to basal inferior location(s) that is fixed. There is abnormal wall motion in the defect area. Consistent with infarction.   Left ventricular function is abnormal. Nuclear stress EF: 51%. The left ventricular ejection fraction is mildly decreased (45-54%). End diastolic cavity size is normal.   CT images were obtained for attenuation correction and were examined for the presence of coronary calcium  when appropriate.   ECHOCARDIOGRAM  ECHOCARDIOGRAM  COMPLETE 01/21/2024  Narrative ECHOCARDIOGRAM REPORT    Patient Name:   Justin Galvan Date of Exam: 01/21/2024 Medical Rec #:  [id number]        Height:       67.0 in Accession #:    [id number]       Weight:       177.0 lb Date of Birth:  10-Jan-1961         BSA:          1.920 m Patient Age:    63 years         BP:           142/67 mmHg Patient Gender: M                HR:           65 bpm. Exam Location:   Inpatient  Procedure: 2D Echo, Cardiac Doppler, Color Doppler, 3D Echo and Strain Analysis (Both Spectral and Color Flow Doppler were utilized during procedure).  Indications:    CAD Native Vessel i25.10  History:        Patient has prior history of Echocardiogram examinations, most recent 08/03/2020. CAD; Risk Factors:Hypertension, Diabetes and Dyslipidemia.  Sonographer:    Damien Senior RDCS Referring Phys: [id number] CHRISTOPHER D MCALHANY  IMPRESSIONS   1. Left ventricular ejection fraction, by estimation, is 45 to 50%. Left ventricular ejection fraction by 3D volume is 48 %. The left ventricle has mildly decreased function. The left ventricle demonstrates regional wall motion abnormalities (see scoring diagram/findings for description). The left ventricular internal cavity size was mildly dilated. Left ventricular diastolic parameters are consistent with Grade I diastolic dysfunction (impaired relaxation). The average left ventricular global longitudinal strain is -15.2 %. The global longitudinal strain is abnormal. 2. Right ventricular systolic function is normal. The right ventricular size is normal. Tricuspid regurgitation signal is inadequate for assessing PA pressure. 3. Left atrial size was mildly dilated. 4. The mitral valve is normal in structure. Trivial mitral valve regurgitation. 5. The aortic valve has an indeterminant number of cusps. Aortic valve regurgitation is not visualized. Aortic valve sclerosis is present, with no evidence of aortic valve stenosis. 6. The inferior vena cava is normal in size with greater than 50% respiratory variability, suggesting right atrial pressure of 3 mmHg.  Comparison(s): A prior study was performed on 08/03/2020. The ejection fraction was 50-55% with similar wall motion abnormalities and no other significant changes.  FINDINGS Left Ventricle: Hypokinesis of the basal to mid inferior wall. Left ventricular ejection fraction, by estimation, is  45 to 50%. Left ventricular ejection fraction by 3D volume is 48 %. The left ventricle has mildly decreased function. The left ventricle demonstrates regional wall motion abnormalities. The average left ventricular global longitudinal strain is -15.2 %. Strain was performed and the global longitudinal strain is abnormal. The left ventricular internal cavity size was mildly dilated. There is no left ventricular hypertrophy. Left ventricular diastolic parameters are consistent with Grade I diastolic dysfunction (impaired relaxation).  Right Ventricle: The right ventricular size is normal. No increase in right ventricular wall thickness. Right ventricular systolic function is normal. Tricuspid regurgitation signal is inadequate for assessing PA pressure.  Left Atrium: Left atrial size was mildly dilated.  Right Atrium: Right atrial size was normal in size.  Pericardium: There is no evidence of pericardial effusion.  Mitral Valve: The mitral valve is normal in structure. Trivial mitral valve regurgitation.  Tricuspid Valve: The tricuspid valve is normal in structure. Tricuspid  valve regurgitation is not demonstrated.  Aortic Valve: The aortic valve has an indeterminant number of cusps. Aortic valve regurgitation is not visualized. Aortic valve sclerosis is present, with no evidence of aortic valve stenosis.  Pulmonic Valve: The pulmonic valve was grossly normal. Pulmonic valve regurgitation is not visualized.  Aorta: The aortic root and ascending aorta are structurally normal, with no evidence of dilitation.  Venous: The inferior vena cava is normal in size with greater than 50% respiratory variability, suggesting right atrial pressure of 3 mmHg.  IAS/Shunts: No atrial level shunt detected by color flow Doppler.  Additional Comments: 3D was performed not requiring image post processing on an independent workstation and was abnormal.   LEFT VENTRICLE PLAX 2D LVIDd:         5.05 cm          Diastology LVIDs:         4.20 cm         LV e' medial:    6.20 cm/s LV PW:         0.80 cm         LV E/e' medial:  11.4 LV IVS:        0.95 cm         LV e' lateral:   11.70 cm/s LVOT diam:     2.10 cm         LV E/e' lateral: 6.0 LV SV:         82 LV SV Index:   43              2D Longitudinal LVOT Area:     3.46 cm        Strain LV IVRT:       102 msec        2D Strain GLS   -15.3 % (A4C): 2D Strain GLS   -14.2 % LV Volumes (MOD)               (A3C): LV vol d, MOD    148.0 ml      2D Strain GLS   -16.0 % A2C:                           (A2C): LV vol d, MOD    123.0 ml      2D Strain GLS   -15.2 % A4C:                           Avg: LV vol s, MOD    55.9 ml A2C:                           3D Volume EF LV vol s, MOD    57.6 ml       LV 3D EF:    Left A4C:                                        ventricul LV SV MOD A2C:   92.1 ml                    ar LV SV MOD A4C:   123.0 ml                   ejection LV SV MOD BP:  78.0 ml                    fraction by 3D volume is 48 %.  3D Volume EF: 3D EF:        48 % LV EDV:       174 ml LV ESV:       91 ml LV SV:        83 ml  RIGHT VENTRICLE RV S prime:     13.40 cm/s TAPSE (M-mode): 2.3 cm  LEFT ATRIUM             Index        RIGHT ATRIUM           Index LA diam:        4.40 cm 2.29 cm/m   RA Area:     15.60 cm LA Vol (A2C):   59.5 ml 30.99 ml/m  RA Volume:   43.50 ml  22.66 ml/m LA Vol (A4C):   69.6 ml 36.25 ml/m LA Biplane Vol: 64.1 ml 33.39 ml/m AORTIC VALVE LVOT Vmax:   93.40 cm/s LVOT Vmean:  72.500 cm/s LVOT VTI:    0.238 m  AORTA Ao Root diam: 3.10 cm Ao Asc diam:  2.50 cm  MITRAL VALVE MV Area (PHT): 2.93 cm    SHUNTS MV Decel Time: 259 msec    Systemic VTI:  0.24 m MV E velocity: 70.70 cm/s  Systemic Diam: 2.10 cm MV A velocity: 87.50 cm/s MV E/A ratio:  0.81  Emeline Calender Electronically signed by Emeline Calender Signature Date/Time: 01/21/2024/4:45:48 PM    Final           ______________________________________________________________________________________________     EKG: NSR I have independently reviewed the above radiologic studies and discussed with the patient   Recent Lab Findings: Lab Results  Component Value Date   WBC 6.8 01/14/2024   HGB 15.5 01/14/2024   HCT 47.3 01/14/2024   PLT 218 01/14/2024   GLUCOSE 136 (H) 01/14/2024   CHOL 137 09/23/2023   TRIG 130.0 09/23/2023   HDL 41.30 09/23/2023   LDLDIRECT 138.0 04/18/2021   LDLCALC 70 09/23/2023   ALT 19 09/23/2023   AST 15 09/23/2023   NA 141 01/14/2024   K 4.1 01/14/2024   CL 99 01/14/2024   CREATININE 0.92 01/14/2024   BUN 21 01/14/2024   CO2 21 01/14/2024   TSH 3.08 09/23/2023   INR 0.9 08/15/2020   HGBA1C 8.0 (H) 09/23/2023      Assessment / Plan:   Mr. Schliep is a 63 year old man who is a former smoker with CAD s/p stents and multiple previous MIs.  He is having stable angina and LHC demonstrated severe left main and multivessel CAD.  He is a good candidate for CABG given his young age, good functional status, diabetes and depressed EF.  I performed an Dasie test of the left hand and he passed.   I discussed the general nature of the procedure, including the need for general anesthesia, the incisions to be used, the use of cardiopulmonary bypass, and the use of temporary pacemaker wires and drainage tubes postoperatively with him.  We discussed the expected hospital stay, overall recovery and short and long term outcomes. I informed him of the indications, risks, benefits and alternatives.   He understands the risks include, but are not limited to death, stroke, MI, DVT/PE, bleeding, possible need for transfusion, infections, cardiac arrhythmias, as well as other organ system dysfunction including  respiratory (eg: prolonged ventilation), renal, or GI complications.   Patient is in agreement to proceed with surgery.  Plan: Proceed with CABG using left radial artery on  02/05/24.  He needs PFTs before surgery.  I  spent 45 minutes counseling the patient face to face.   Con RAMAN Naomii Kreger 01/23/2024 3:43 PM

## 2024-01-22 NOTE — Progress Notes (Signed)
 [address], [place] [address]             [phone]    MICHAI DIEPPA [facility] Medical Record #[id number] Date of Birth: 07-16-1960  Referring: Verlin Lonni BIRCH* Primary Care: Joshua Debby CROME, Justin Galvan, Justin Galvan, Justin Galvan and ECHO 01/21/24    History of Present Illness:     Justin Galvan is a 63 y.o. male who presents for surgical evaluation of multivessel coronary artery disease.  He was 63 years old when he had his first MI - at that time he had neck burning then head burning that then radiated down his left arm.  He had another heart attack at 70 and was doing well but for the last 3 months has been having more pain.  He is taking nitroglycerin  1-3 times a day and tried taking Imdur  but had severe headaches (even when taking at night) so he has subsequently stopped.  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  When the weather was very hot, he would have a lot more pain as it was exacerbated by heat.  He feels better recently now that the weather has cooled down.  He is still working every day.  He is right hand dominant.  His blood pressure is moderately well controlled, it was high today but he says it's usually SBP 150s at home.  He denies syncope, palpitations, leg swelling, orthopnea or dizziness.  He is a former 60 pack year smoker, and quit in 1998.  He drinks 1 alcoholic beverage a month. No strokes.   PMH: CAD s/p RCA stent, mid LAD stent, HTN, HL, DM2 (A1c 8.0% June 2025), BPH  TTE (01/21/24): Low normal EF (45-50%), normal RV function, no valvular disease LHC (01/20/34): Lots of RCA stents - CTO, Mid to distal LM 70%, 99% ostial circ  CT chest (10/08/23): Not a lot of calcium , normal sized aorta, lungs looks good  Past Medical and Surgical History: Previous Chest Surgery: No Previous  Chest Radiation: No Diabetes Mellitus: Yes.  HbA1C 8.0 Anticoagulation: No, Last dose n/a  Creatinine:  Lab Results  Component Value Date   CREATININE 0.92 01/14/2024   CREATININE 0.99 09/23/2023   CREATININE 0.91 02/21/2023     Past Medical History:  Diagnosis Date   Anginal pain    Arthritis    fingers (04/22/2015)   BPH (benign prostatic hypertrophy)    CAD, NATIVE VESSEL, Hx. 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 (HCC) 08/19/2008   DM2 (diabetes mellitus, type 2) (HCC)    Erectile dysfunction due to arterial insufficiency 10/10/2016   Essential hypertension 08/19/2008   HLD (hyperlipidemia)    HTN (hypertension)    Hypertriglyceridemia 05/03/2009        Kidney stones    MI (myocardial infarction) (HCC) 1997; ~ 2002; 04/2013   MRSA carrier 11/20/2016   S/P coronary artery stent placement -mid LAD, 05/06/13 Promus Premier DES 05/07/2013    Past Surgical History:  Procedure Laterality Date   Justin CATHETERIZATION N/A 04/26/2015   Procedure: Left Heart Galvan and Coronary Angiography;  Surgeon: Lonni JONETTA Cash, Justin;  Location: [facility] INVASIVE CV LAB;  Service: Cardiovascular;  Laterality: N/A;   CORONARY ANGIOPLASTY WITH STENT PLACEMENT  1997   got several stents; ~8 from 1997 to January 2015; 2 stent segments in the RCA, and single proximal-mid LAD stent jailing D1   LEFT HEART Galvan AND CORONARY ANGIOGRAPHY N/A 01/21/2024   Procedure: LEFT HEART Galvan AND CORONARY ANGIOGRAPHY;  Surgeon: Cash Lonni JONETTA, Justin;  Location: [facility] INVASIVE CV LAB;  Service: Cardiovascular;  Laterality: N/A;   LEFT HEART CATHETERIZATION WITH CORONARY ANGIOGRAM N/A 05/06/2013   Procedure: LEFT HEART CATHETERIZATION WITH CORONARY ANGIOGRAM;  Surgeon: Lonni JONETTA Cash, Justin;  Location: [facility];   Service: CV:;; Two-vessel CAD.  Patent LAD stent.  Mid LAD has segmental 20% stenosis involving 30% ostial D2.  Severe disease in small jailed D1 (90%) - Too small for PCI.  Proximal RCA stent 50% ISR -& CTO of mid RCA W/L-to-R collaterals.  Mild segmental LV dysfxn.    Social History:  Social History   Tobacco Use  Smoking Status Former   Current packs/day: 0.00   Average packs/day: 2.8 packs/day for 22.0 years (60.5 ttl pk-yrs)   Types: Cigarettes   Start date: 09/08/1974   Quit date: 09/07/1996   Years since quitting: 27.3  Smokeless Tobacco Never    Social History   Substance and Sexual Activity  Alcohol Use Yes   Comment: 04/22/2015 might drink a 6 pack of beer/year     Allergies  Allergen Reactions   Semaglutide (0.25 Or 0.5mg -Dos) Nausea And Vomiting    Medications: Asprin: yes Statin: yes Beta Blocker: yes Ace Inhibitor: yes Anti-Coagulation: no  Current Outpatient Medications  Medication Sig Dispense Refill   ASPIRIN  LOW DOSE 81 MG tablet TAKE 1 TABLET (81 MG TOTAL) BY MOUTH DAILY. SWALLOW WHOLE. 30 tablet 5   atorvastatin  (LIPITOR ) 80 MG tablet TAKE 1 TABLET (80 MG TOTAL) BY MOUTH DAILY AT 6PM 90 tablet 1   carvedilol  (COREG ) 3.125 MG tablet Take 1 tablet (3.125 mg total) by mouth 2 (two) times daily. 180 tablet 3   Continuous Glucose Sensor (FREESTYLE LIBRE 3 PLUS SENSOR) MISC Apply 1 Act topically every 14 (fourteen) days. Change sensor every 15 days. 6 each 1   icosapent  Ethyl (VASCEPA ) 1 g capsule TAKE 2 CAPSULES BY MOUTH 2 TIMES DAILY. 360 capsule 1   indapamide  (LOZOL ) 1.25 MG tablet TAKE 1 TABLET BY MOUTH DAILY. 30 tablet 2   insulin  glargine, 2 Unit Dial, (TOUJEO  MAX SOLOSTAR) 300 UNIT/ML Solostar Pen INJECT 20 UNITS INTO THE SKIN DAILY 3 mL 1   Insulin  Pen Needle 32G X 6 MM MISC 1 Act by Does not apply route daily. 100 each 1   irbesartan  (AVAPRO ) 300 MG tablet TAKE 1 TABLET BY MOUTH EVERY DAY 30 tablet 2   isosorbide  mononitrate (IMDUR ) 30 MG 24 hr  tablet Take 1 tablet (30 mg total) by mouth daily. 90 tablet 3   JARDIANCE  10 MG TABS tablet TAKE 1 TABLET BY MOUTH DAILY BEFORE BREAKFAST. 30 tablet 2   metFORMIN  (GLUCOPHAGE -XR) 750 MG 24 hr tablet Take 1 tablet (750 mg total) by mouth daily with breakfast. 180 tablet 0   nitroGLYCERIN  (NITROSTAT ) 0.4 MG SL tablet Place 1 tablet (0.4 mg total) under the tongue every 5 (five) minutes as needed for chest pain. 25 tablet 6   PARoxetine  (PAXIL ) 40 MG tablet TAKE 1 TABLET BY MOUTH EVERY DAY 30 tablet 2   tadalafil  (CIALIS ) 20 MG tablet Take  1 tablet (20 mg total) by mouth daily as needed for erectile dysfunction. 8 tablet 0   terazosin  (HYTRIN ) 2 MG capsule TAKE 1 CAPSULE BY MOUTH EVERYDAY AT BEDTIME 30 capsule 5   No current facility-administered medications for this visit.    (Not in a hospital admission)   Family History  Problem Relation Age of Onset   Heart disease Mother    Hypertension Other    Cancer Neg Hx    Diabetes Neg Hx    Early death Neg Hx    Hyperlipidemia Neg Hx    Kidney disease Neg Hx    Stroke Neg Hx    Alcohol abuse Neg Hx      Review of Systems:   Review of Systems  Constitutional:  Negative for chills, fever and weight loss.  Respiratory:  Negative for cough and shortness of breath.   Cardiovascular:  Positive for chest pain. Negative for palpitations, claudication and leg swelling.  Gastrointestinal:  Negative for nausea and vomiting.  Musculoskeletal:  Negative for falls.  Neurological:  Negative for dizziness and headaches.      Physical Exam: BP (!) 190/81   Pulse 82   Resp 18   Ht 5' 7 (1.702 m)   Wt 177 lb (80.3 kg)   SpO2 95% Comment: RA  BMI 27.72 kg/m  Physical Exam Constitutional:      Appearance: Normal appearance. He is normal weight.  HENT:     Head: Normocephalic and atraumatic.  Cardiovascular:     Rate and Rhythm: Normal rate.     Heart sounds: Murmur heard.     Comments: Passed Allen test. Pulmonary:     Effort:  Pulmonary effort is normal.     Breath sounds: Normal breath sounds.  Abdominal:     General: There is no distension.     Palpations: Abdomen is soft.     Tenderness: There is no abdominal tenderness.  Musculoskeletal:        General: No swelling.  Skin:    General: Skin is warm and dry.  Neurological:     General: No focal deficit present.     Mental Status: He is alert and oriented to person, place, and time.     + murmur  Diagnostic Studies & Laboratory data: Justin Studies & Procedures   ______________________________________________________________________________________________ Justin CATHETERIZATION  Justin CATHETERIZATION 01/21/2024  Conclusion   Ost 1st Diag to 1st Diag lesion is 90% stenosed.   Ost 2nd Diag lesion is 30% stenosed.   Mid LM to Dist LM lesion is 70% stenosed.   Prox RCA lesion is 90% stenosed.   Mid RCA lesion is 100% stenosed.   Ost Cx to Prox Cx lesion is 99% stenosed.   Mid LAD-1 lesion is 20% stenosed.   Mid LAD-2 lesion is 65% stenosed.   Mid Cx lesion is 50% stenosed.   1st Diag lesion is 80% stenosed.   3rd Diag lesion is 40% stenosed.  Severe distal left main disease Patent mid LAD stent. Moderately severe mid LAD stenosis Severe ostial Circumflex stenosis Chronic total occlusion of the mid RCA. The distal RCA, PDA and posterolateral artery fill from left to right collaterals. Mild LV systolic dysfunction with inferior wall hypokinesis.  Recommendations: Diabetic male with sever left main disease and three vessel CAD. LV function is mildly abnormal by LV gram. I think the best option for revascularization is bypass surgery. Will refer to CT surgery to consider bypass. Echo in holding area before discharge today.  Findings Coronary Findings Diagnostic  Dominance: Right  Left Main Mid LM to Dist LM lesion is 70% stenosed.  Left Anterior Descending Vessel is large. Mid LAD-1 lesion is 20% stenosed. The lesion was previously  treated using a drug eluting stent over 2 years ago. Mid LAD-2 lesion is 65% stenosed. The lesion is segmental.  First Diagonal Branch Vessel is small in size. Ost 1st Diag to 1st Diag lesion is 90% stenosed. The lesion is discrete. 1st Diag lesion is 80% stenosed.  Second Diagonal [employer] 2nd Diag lesion is 30% stenosed. The lesion is discrete.  Third Diagonal Branch 3rd Diag lesion is 40% stenosed.  Left Circumflex Vessel is moderate in size. Ost Cx to Prox Cx lesion is 99% stenosed. Mid Cx lesion is 50% stenosed.  First Obtuse Marginal Branch Vessel is small in size.  Second Obtuse Marginal Branch Vessel is moderate in size.  Right Coronary Artery Prox RCA lesion is 90% stenosed. The lesion was previously treated using a drug eluting stent over 2 years ago. Mid RCA lesion is 100% stenosed. The lesion was previously treated using a drug eluting stent over 2 years ago.  Right Posterior Descending Artery Collaterals RPDA filled by collaterals from 2nd Mrg.  Right Posterior Atrioventricular Artery Collaterals RPAV filled by collaterals from Dist Cx.  Intervention  No interventions have been documented.   Justin CATHETERIZATION  Justin CATHETERIZATION 04/26/2015  Conclusion 1. Double vessel CAD. ] 2. Patent stent mid LAD with no restenosis. 3. Severe disease very small diagonal branch at the ostium where the vessel is jailed by the LAD stent. Too small for PCI. 4. Chronic occlusion mid RCA. The distal vessel fills from left to right collaterals. 5. Mild segmental LV systolic dysfunction.  Recommendations. Continue medical management of CAD. Imdur  was added. Discharge home today. Follow up with me or office APP 2-3 weeks.  Findings Coronary Findings Diagnostic  Dominance: Right  Left Anterior Descending . Vessel is large. Previously placed Prox LAD to Mid LAD stent (unknown type) is patent. Diffuse.  First Diagonal Branch The vessel is small in  size. Discrete.  Second Personnel officer.  Left Circumflex . Vessel is moderate in size.  First Obtuse Marginal Branch The vessel is small in size.  Second Obtuse Marginal Branch The vessel is moderate in size.  Right Coronary Artery Diffuse. The lesion was previously treated with a stent (unknown type) greater than two years ago. The lesion was previously treated with a stent (unknown type) greater than two years ago.  Right Posterior Descending Artery Collaterals RPDA filled by collaterals from 2nd Mrg.  Right Posterior Atrioventricular Artery Collaterals RPAV filled by collaterals from Dist Cx.  Intervention  No interventions have been documented.   STRESS TESTS  MYOCARDIAL PERFUSION IMAGING 10/08/2023  Interpretation Summary   Findings are consistent with infarction. The study is intermediate risk.   No ST deviation was noted.   LV perfusion is abnormal. There is no evidence of ischemia. There is evidence of infarction. Defect 1: There is a large defect with severe reduction in uptake present in the mid to basal inferior location(s) that is fixed. There is abnormal wall motion in the defect area. Consistent with infarction.   Left ventricular function is abnormal. Nuclear stress EF: 51%. The left ventricular ejection fraction is mildly decreased (45-54%). End diastolic cavity size is normal.   CT images were obtained for attenuation correction and were examined for the presence of coronary calcium  when appropriate.   ECHOCARDIOGRAM  ECHOCARDIOGRAM  COMPLETE 01/21/2024  Narrative ECHOCARDIOGRAM REPORT    Patient Name:   NIV DARLEY Date of Exam: 01/21/2024 Medical Rec #:  [id number]        Height:       67.0 in Accession #:    [id number]       Weight:       177.0 lb Date of Birth:  10-Jan-1961         BSA:          1.920 m Patient Age:    63 years         BP:           142/67 mmHg Patient Gender: M                HR:           65 bpm. Exam Location:   Inpatient  Procedure: 2D Echo, Justin Doppler, Color Doppler, 3D Echo and Strain Analysis (Both Spectral and Color Flow Doppler were utilized during procedure).  Indications:    CAD Native Vessel i25.10  History:        Patient has prior history of Echocardiogram examinations, most recent 08/03/2020. CAD; Risk Factors:Hypertension, Diabetes and Dyslipidemia.  Sonographer:    Damien Senior RDCS Referring Phys: [id number] CHRISTOPHER D MCALHANY  IMPRESSIONS   1. Left ventricular ejection fraction, by estimation, is 45 to 50%. Left ventricular ejection fraction by 3D volume is 48 %. The left ventricle has mildly decreased function. The left ventricle demonstrates regional wall motion abnormalities (see scoring diagram/findings for description). The left ventricular internal cavity size was mildly dilated. Left ventricular diastolic parameters are consistent with Grade I diastolic dysfunction (impaired relaxation). The average left ventricular global longitudinal strain is -15.2 %. The global longitudinal strain is abnormal. 2. Right ventricular systolic function is normal. The right ventricular size is normal. Tricuspid regurgitation signal is inadequate for assessing PA pressure. 3. Left atrial size was mildly dilated. 4. The mitral valve is normal in structure. Trivial mitral valve regurgitation. 5. The aortic valve has an indeterminant number of cusps. Aortic valve regurgitation is not visualized. Aortic valve sclerosis is present, with no evidence of aortic valve stenosis. 6. The inferior vena cava is normal in size with greater than 50% respiratory variability, suggesting right atrial pressure of 3 mmHg.  Comparison(s): A prior study was performed on 08/03/2020. The ejection fraction was 50-55% with similar wall motion abnormalities and no other significant changes.  FINDINGS Left Ventricle: Hypokinesis of the basal to mid inferior wall. Left ventricular ejection fraction, by estimation, is  45 to 50%. Left ventricular ejection fraction by 3D volume is 48 %. The left ventricle has mildly decreased function. The left ventricle demonstrates regional wall motion abnormalities. The average left ventricular global longitudinal strain is -15.2 %. Strain was performed and the global longitudinal strain is abnormal. The left ventricular internal cavity size was mildly dilated. There is no left ventricular hypertrophy. Left ventricular diastolic parameters are consistent with Grade I diastolic dysfunction (impaired relaxation).  Right Ventricle: The right ventricular size is normal. No increase in right ventricular wall thickness. Right ventricular systolic function is normal. Tricuspid regurgitation signal is inadequate for assessing PA pressure.  Left Atrium: Left atrial size was mildly dilated.  Right Atrium: Right atrial size was normal in size.  Pericardium: There is no evidence of pericardial effusion.  Mitral Valve: The mitral valve is normal in structure. Trivial mitral valve regurgitation.  Tricuspid Valve: The tricuspid valve is normal in structure. Tricuspid  valve regurgitation is not demonstrated.  Aortic Valve: The aortic valve has an indeterminant number of cusps. Aortic valve regurgitation is not visualized. Aortic valve sclerosis is present, with no evidence of aortic valve stenosis.  Pulmonic Valve: The pulmonic valve was grossly normal. Pulmonic valve regurgitation is not visualized.  Aorta: The aortic root and ascending aorta are structurally normal, with no evidence of dilitation.  Venous: The inferior vena cava is normal in size with greater than 50% respiratory variability, suggesting right atrial pressure of 3 mmHg.  IAS/Shunts: No atrial level shunt detected by color flow Doppler.  Additional Comments: 3D was performed not requiring image post processing on an independent workstation and was abnormal.   LEFT VENTRICLE PLAX 2D LVIDd:         5.05 cm          Diastology LVIDs:         4.20 cm         LV e' medial:    6.20 cm/s LV PW:         0.80 cm         LV E/e' medial:  11.4 LV IVS:        0.95 cm         LV e' lateral:   11.70 cm/s LVOT diam:     2.10 cm         LV E/e' lateral: 6.0 LV SV:         82 LV SV Index:   43              2D Longitudinal LVOT Area:     3.46 cm        Strain LV IVRT:       102 msec        2D Strain GLS   -15.3 % (A4C): 2D Strain GLS   -14.2 % LV Volumes (MOD)               (A3C): LV vol d, MOD    148.0 ml      2D Strain GLS   -16.0 % A2C:                           (A2C): LV vol d, MOD    123.0 ml      2D Strain GLS   -15.2 % A4C:                           Avg: LV vol s, MOD    55.9 ml A2C:                           3D Volume EF LV vol s, MOD    57.6 ml       LV 3D EF:    Left A4C:                                        ventricul LV SV MOD A2C:   92.1 ml                    ar LV SV MOD A4C:   123.0 ml                   ejection LV SV MOD BP:  78.0 ml                    fraction by 3D volume is 48 %.  3D Volume EF: 3D EF:        48 % LV EDV:       174 ml LV ESV:       91 ml LV SV:        83 ml  RIGHT VENTRICLE RV S prime:     13.40 cm/s TAPSE (M-mode): 2.3 cm  LEFT ATRIUM             Index        RIGHT ATRIUM           Index LA diam:        4.40 cm 2.29 cm/m   RA Area:     15.60 cm LA Vol (A2C):   59.5 ml 30.99 ml/m  RA Volume:   43.50 ml  22.66 ml/m LA Vol (A4C):   69.6 ml 36.25 ml/m LA Biplane Vol: 64.1 ml 33.39 ml/m AORTIC VALVE LVOT Vmax:   93.40 cm/s LVOT Vmean:  72.500 cm/s LVOT VTI:    0.238 m  AORTA Ao Root diam: 3.10 cm Ao Asc diam:  2.50 cm  MITRAL VALVE MV Area (PHT): 2.93 cm    SHUNTS MV Decel Time: 259 msec    Systemic VTI:  0.24 m MV E velocity: 70.70 cm/s  Systemic Diam: 2.10 cm MV A velocity: 87.50 cm/s MV E/A ratio:  0.81  Emeline Calender Electronically signed by Emeline Calender Signature Date/Time: 01/21/2024/4:45:48 PM    Final           ______________________________________________________________________________________________     EKG: NSR I have independently reviewed the above radiologic studies and discussed with the patient   Recent Lab Findings: Lab Results  Component Value Date   WBC 6.8 01/14/2024   HGB 15.5 01/14/2024   HCT 47.3 01/14/2024   PLT 218 01/14/2024   GLUCOSE 136 (H) 01/14/2024   CHOL 137 09/23/2023   TRIG 130.0 09/23/2023   HDL 41.30 09/23/2023   LDLDIRECT 138.0 04/18/2021   LDLCALC 70 09/23/2023   ALT 19 09/23/2023   AST 15 09/23/2023   NA 141 01/14/2024   K 4.1 01/14/2024   CL 99 01/14/2024   CREATININE 0.92 01/14/2024   BUN 21 01/14/2024   CO2 21 01/14/2024   TSH 3.08 09/23/2023   INR 0.9 08/15/2020   HGBA1C 8.0 (H) 09/23/2023      Assessment / Plan:   Mr. Schliep is a 63 year old man who is a former smoker with CAD s/p stents and multiple previous MIs.  He is having stable angina and LHC demonstrated severe left main and multivessel CAD.  He is a good candidate for CABG given his young age, good functional status, diabetes and depressed EF.  I performed an Dasie test of the left hand and he passed.   I discussed the general nature of the procedure, including the need for general anesthesia, the incisions to be used, the use of cardiopulmonary bypass, and the use of temporary pacemaker wires and drainage tubes postoperatively with him.  We discussed the expected hospital stay, overall recovery and short and long term outcomes. I informed him of the indications, risks, benefits and alternatives.   He understands the risks include, but are not limited to death, stroke, MI, DVT/PE, bleeding, possible need for transfusion, infections, Justin arrhythmias, as well as other organ system dysfunction including  respiratory (eg: prolonged ventilation), renal, or GI complications.   Patient is in agreement to proceed with surgery.  Plan: Proceed with CABG using left radial artery on  02/05/24.  He needs PFTs before surgery.  I  spent 45 minutes counseling the patient face to face.   Con RAMAN Naomii Kreger 01/23/2024 3:43 PM

## 2024-01-23 ENCOUNTER — Other Ambulatory Visit: Payer: Self-pay

## 2024-01-23 ENCOUNTER — Ambulatory Visit

## 2024-01-23 VITALS — BP 190/81 | HR 82 | Resp 18 | Ht 67.0 in | Wt 177.0 lb

## 2024-01-23 DIAGNOSIS — Z01810 Encounter for preprocedural cardiovascular examination: Secondary | ICD-10-CM

## 2024-01-23 DIAGNOSIS — I2510 Atherosclerotic heart disease of native coronary artery without angina pectoris: Secondary | ICD-10-CM

## 2024-01-24 ENCOUNTER — Other Ambulatory Visit: Payer: Self-pay | Admitting: *Deleted

## 2024-01-24 DIAGNOSIS — R079 Chest pain, unspecified: Secondary | ICD-10-CM

## 2024-01-24 DIAGNOSIS — I21A9 Other myocardial infarction type: Secondary | ICD-10-CM

## 2024-01-24 DIAGNOSIS — I25110 Atherosclerotic heart disease of native coronary artery with unstable angina pectoris: Secondary | ICD-10-CM

## 2024-01-24 MED ORDER — ~~LOC~~ CARDIAC SURGERY, PATIENT & FAMILY EDUCATION: Freq: Once | Status: DC

## 2024-01-27 ENCOUNTER — Encounter: Payer: Self-pay | Admitting: *Deleted

## 2024-01-31 ENCOUNTER — Encounter (HOSPITAL_COMMUNITY): Payer: Self-pay

## 2024-01-31 NOTE — Progress Notes (Signed)
 PCP - Dr Debby Molt Cardiologist - Dr Alm Clay  Chest x-ray - 02/03/24 EKG - 01/14/24 Stress Test - 10/08/23 ECHO - 01/21/24 Cardiac Cath - 01/21/24  ICD Pacemaker/Loop - N/A  Sleep Study -  N/A  Diabetes Type 2 Fasting Blood Sugar - 180s-200s Checks Blood Sugar 1  times a day  Do not take Metformin  on the morning of surgery.  THE MORNING OF SURGERY, take take 10 units of insulin  glargine (TOUJEO  MAX SOLOSTAR)  STOP taking your JARDIANCE  three days prior to surgery. Your last dose will be October 11th   Blood Thinner Instructions:  n/a  Aspirin  Instructions: Continue but do not take on the morning of surgery.  NPO   Anesthesia review: Yes  STOP now taking any Aspirin  (unless otherwise instructed by your surgeon), Aleve, Naproxen, Ibuprofen, Motrin, Advil, Goody's, BC's, all herbal medications, fish oil, and all vitamins.   Coronavirus Screening Do you have any of the following symptoms:  Cough yes/no: No Fever (>100.58F)  yes/no: No Runny nose yes/no: No Sore throat yes/no: No Difficulty breathing/shortness of breath  yes/no: No  Have you traveled in the last 14 days and where? yes/no: No  Patient verbalized understanding of instructions that were given to them at the PAT appointment. Patient was also instructed that they will need to review over the PAT instructions again at home before surgery.

## 2024-01-31 NOTE — Pre-Procedure Instructions (Signed)
 Surgical Instructions   Your procedure is scheduled on February 05, 2024. Report to [facility] Main Entrance A at 6:30 A.M., then check in with the Admitting office. Any questions or running late day of surgery: call [phone]  Questions prior to your surgery date: call [phone], Monday-Friday, 8am-4pm. If you experience any cold or flu symptoms such as cough, fever, chills, shortness of breath, etc. between now and your scheduled surgery, please notify us  at the above number.     Remember:  Do not eat or drink after midnight the night before your surgery    Take these medicines the morning of surgery with A SIP OF WATER: carvedilol  (COREG )  isosorbide  mononitrate (IMDUR )  PARoxetine  (PAXIL )  icosapent  Ethyl (VASCEPA )   May take these medicines IF NEEDED: nitroGLYCERIN  (NITROSTAT ) - if dose taken prior to surgery, please call [phone]   Continue taking your Aspirin  through the day before surgery. DO NOT take any the morning of surgery.   One week prior to surgery, STOP taking any Aleve, Naproxen, Ibuprofen, Motrin, Advil, Goody's, BC's, all herbal medications, fish oil, and non-prescription vitamins.   WHAT DO I DO ABOUT MY DIABETES MEDICATION?   STOP taking your metFORMIN  (GLUCOPHAGE -XR) two days prior to surgery. Your last dose will be October 12th per MD.  THE NIGHT BEFORE SURGERY/THE MORNING OF SURGERY, take 10 units of insulin  glargine (TOUJEO  MAX SOLOSTAR).  STOP taking your JARDIANCE  three days prior to surgery. Your last dose will be October 11th.   HOW TO MANAGE YOUR DIABETES BEFORE AND AFTER SURGERY  Why is it important to control my blood sugar before and after surgery? Improving blood sugar levels before and after surgery helps healing and can limit problems. A way of improving blood sugar control is eating a healthy diet by:  Eating less sugar and carbohydrates  Increasing activity/exercise  Talking with your doctor about reaching your blood  sugar goals High blood sugars (greater than 180 mg/dL) can raise your risk of infections and slow your recovery, so you will need to focus on controlling your diabetes during the weeks before surgery. Make sure that the doctor who takes care of your diabetes knows about your planned surgery including the date and location.  How do I manage my blood sugar before surgery? Check your blood sugar at least 4 times a day, starting 2 days before surgery, to make sure that the level is not too high or low.  Check your blood sugar the morning of your surgery when you wake up and every 2 hours until you get to the Short Stay unit.  If your blood sugar is less than 70 mg/dL, you will need to treat for low blood sugar: Do not take insulin . Treat a low blood sugar (less than 70 mg/dL) with  cup of clear juice (cranberry or apple), 4 glucose tablets, OR glucose gel. Recheck blood sugar in 15 minutes after treatment (to make sure it is greater than 70 mg/dL). If your blood sugar is not greater than 70 mg/dL on recheck, call [phone] for further instructions. Report your blood sugar to the short stay nurse when you get to Short Stay.  If you are admitted to the hospital after surgery: Your blood sugar will be checked by the staff and you will probably be given insulin  after surgery (instead of oral diabetes medicines) to make sure you have good blood sugar levels. The goal for blood sugar control after surgery is 80-180 mg/dL.  Do NOT Smoke (Tobacco/Vaping) for 24 hours prior to your procedure.  If you use a CPAP at night, you may bring your mask/headgear for your overnight stay.   You will be asked to remove any contacts, glasses, piercing's, hearing aid's, dentures/partials prior to surgery. Please bring cases for these items if needed.    Patients discharged the day of surgery will not be allowed to drive home, and someone needs to stay with them for 24 hours.  SURGICAL  WAITING ROOM VISITATION Patients may have no more than 2 support people in the waiting area - these visitors may rotate.   Pre-op nurse will coordinate an appropriate time for 1 ADULT support person, who may not rotate, to accompany patient in pre-op.  Children under the age of 61 must have an adult with them who is not the patient and must remain in the main waiting area with an adult.  If the patient needs to stay at the hospital during part of their recovery, the visitor guidelines for inpatient rooms apply.  Please refer to the [facility] website for the visitor guidelines for any additional information.   If you received a COVID test during your pre-op visit  it is requested that you wear a mask when out in public, stay away from anyone that may not be feeling well and notify your surgeon if you develop symptoms. If you have been in contact with anyone that has tested positive in the last 10 days please notify you surgeon.      Pre-operative CHG Bathing Instructions   You can play a key role in reducing the risk of infection after surgery. Your skin needs to be as free of germs as possible. You can reduce the number of germs on your skin by washing with CHG (chlorhexidine  gluconate) soap before surgery. CHG is an antiseptic soap that kills germs and continues to kill germs even after washing.   DO NOT use if you have an allergy to chlorhexidine /CHG or antibacterial soaps. If your skin becomes reddened or irritated, stop using the CHG and notify one of our RNs at [phone].              TAKE A SHOWER THE NIGHT BEFORE SURGERY   Please keep in mind the following:  DO NOT shave, including legs and underarms, 48 hours prior to surgery.   You may shave your face before/day of surgery.  Place clean sheets on your bed the night before surgery Use a clean washcloth (not used since being washed) for shower. DO NOT sleep with pet's night before surgery.  CHG Shower Instructions:  Wash  your face and private area with normal soap. If you choose to wash your hair, wash first with your normal shampoo.  After you use shampoo/soap, rinse your hair and body thoroughly to remove shampoo/soap residue.  Turn the water OFF and apply half the bottle of CHG soap to a CLEAN washcloth.  Apply CHG soap ONLY FROM YOUR NECK DOWN TO YOUR TOES (washing for 3-5 minutes)  DO NOT use CHG soap on face, private areas, open wounds, or sores.  Pay special attention to the area where your surgery is being performed.  If you are having back surgery, having someone wash your back for you may be helpful. Wait 2 minutes after CHG soap is applied, then you may rinse off the CHG soap.  Pat dry with a clean towel  Put on clean pajamas    Additional instructions for the day  of surgery: If you choose, you may shower the morning of surgery with an antibacterial soap.  DO NOT APPLY any lotions, deodorants, cologne, or perfumes.   Do not wear jewelry or makeup Do not wear nail polish, gel polish, artificial nails, or any other type of covering on natural nails (fingers and toes) Do not bring valuables to the hospital. [facility] is not responsible for valuables/personal belongings. Put on clean/comfortable clothes.  Please brush your teeth.  Ask your nurse before applying any prescription medications to the skin.

## 2024-01-31 NOTE — Pre-Procedure Instructions (Signed)
 Surgical Instructions   Your procedure is scheduled on February 05, 2024. Report to [facility] Main Entrance A at 6:30 A.M., then check in with the Admitting office. Any questions or running late day of surgery: call [phone]  Questions prior to your surgery date: call [phone], Monday-Friday, 8am-4pm. If you experience any cold or flu symptoms such as cough, fever, chills, shortness of breath, etc. between now and your scheduled surgery, please notify us  at the above number.     Remember:  Do not eat or drink after midnight the night before your surgery    Take these medicines the morning of surgery with A SIP OF WATER: carvedilol  (COREG )  isosorbide  mononitrate (IMDUR )  PARoxetine  (PAXIL )    May take these medicines IF NEEDED: nitroGLYCERIN  (NITROSTAT ) - if dose taken prior to surgery, please call [phone]   Continue taking your Aspirin  through the day before surgery. DO NOT take any the morning of surgery.   One week prior to surgery, STOP taking any Aleve, Naproxen, Ibuprofen, Motrin, Advil, Goody's, BC's, all herbal medications, fish oil, and non-prescription vitamins.   WHAT DO I DO ABOUT MY DIABETES MEDICATION?   STOP taking your metFORMIN  (GLUCOPHAGE -XR) two days prior to surgery. Your last dose will be October 12th.  THE NIGHT BEFORE SURGERY/THE MORNING OF SURGERY, take 10 units of insulin  glargine (TOUJEO  MAX SOLOSTAR).  STOP taking your JARDIANCE  three days prior to surgery. Your last dose will be October 11th.   HOW TO MANAGE YOUR DIABETES BEFORE AND AFTER SURGERY  Why is it important to control my blood sugar before and after surgery? Improving blood sugar levels before and after surgery helps healing and can limit problems. A way of improving blood sugar control is eating a healthy diet by:  Eating less sugar and carbohydrates  Increasing activity/exercise  Talking with your doctor about reaching your blood sugar goals High blood sugars  (greater than 180 mg/dL) can raise your risk of infections and slow your recovery, so you will need to focus on controlling your diabetes during the weeks before surgery. Make sure that the doctor who takes care of your diabetes knows about your planned surgery including the date and location.  How do I manage my blood sugar before surgery? Check your blood sugar at least 4 times a day, starting 2 days before surgery, to make sure that the level is not too high or low.  Check your blood sugar the morning of your surgery when you wake up and every 2 hours until you get to the Short Stay unit.  If your blood sugar is less than 70 mg/dL, you will need to treat for low blood sugar: Do not take insulin . Treat a low blood sugar (less than 70 mg/dL) with  cup of clear juice (cranberry or apple), 4 glucose tablets, OR glucose gel. Recheck blood sugar in 15 minutes after treatment (to make sure it is greater than 70 mg/dL). If your blood sugar is not greater than 70 mg/dL on recheck, call [phone] for further instructions. Report your blood sugar to the short stay nurse when you get to Short Stay.  If you are admitted to the hospital after surgery: Your blood sugar will be checked by the staff and you will probably be given insulin  after surgery (instead of oral diabetes medicines) to make sure you have good blood sugar levels. The goal for blood sugar control after surgery is 80-180 mg/dL.  Do NOT Smoke (Tobacco/Vaping) for 24 hours prior to your procedure.  If you use a CPAP at night, you may bring your mask/headgear for your overnight stay.   You will be asked to remove any contacts, glasses, piercing's, hearing aid's, dentures/partials prior to surgery. Please bring cases for these items if needed.    Patients discharged the day of surgery will not be allowed to drive home, and someone needs to stay with them for 24 hours.  SURGICAL WAITING ROOM VISITATION Patients  may have no more than 2 support people in the waiting area - these visitors may rotate.   Pre-op nurse will coordinate an appropriate time for 1 ADULT support person, who may not rotate, to accompany patient in pre-op.  Children under the age of 47 must have an adult with them who is not the patient and must remain in the main waiting area with an adult.  If the patient needs to stay at the hospital during part of their recovery, the visitor guidelines for inpatient rooms apply.  Please refer to the [facility] website for the visitor guidelines for any additional information.   If you received a COVID test during your pre-op visit  it is requested that you wear a mask when out in public, stay away from anyone that may not be feeling well and notify your surgeon if you develop symptoms. If you have been in contact with anyone that has tested positive in the last 10 days please notify you surgeon.      Pre-operative CHG Bathing Instructions   You can play a key role in reducing the risk of infection after surgery. Your skin needs to be as free of germs as possible. You can reduce the number of germs on your skin by washing with CHG (chlorhexidine  gluconate) soap before surgery. CHG is an antiseptic soap that kills germs and continues to kill germs even after washing.   DO NOT use if you have an allergy to chlorhexidine /CHG or antibacterial soaps. If your skin becomes reddened or irritated, stop using the CHG and notify one of our RNs at [phone].              TAKE A SHOWER THE NIGHT BEFORE SURGERY   Please keep in mind the following:  DO NOT shave, including legs and underarms, 48 hours prior to surgery.   You may shave your face before/day of surgery.  Place clean sheets on your bed the night before surgery Use a clean washcloth (not used since being washed) for shower. DO NOT sleep with pet's night before surgery.  CHG Shower Instructions:  Wash your face and private area with  normal soap. If you choose to wash your hair, wash first with your normal shampoo.  After you use shampoo/soap, rinse your hair and body thoroughly to remove shampoo/soap residue.  Turn the water OFF and apply half the bottle of CHG soap to a CLEAN washcloth.  Apply CHG soap ONLY FROM YOUR NECK DOWN TO YOUR TOES (washing for 3-5 minutes)  DO NOT use CHG soap on face, private areas, open wounds, or sores.  Pay special attention to the area where your surgery is being performed.  If you are having back surgery, having someone wash your back for you may be helpful. Wait 2 minutes after CHG soap is applied, then you may rinse off the CHG soap.  Pat dry with a clean towel  Put on clean pajamas    Additional instructions for the day  of surgery: If you choose, you may shower the morning of surgery with an antibacterial soap.  DO NOT APPLY any lotions, deodorants, cologne, or perfumes.   Do not wear jewelry or makeup Do not wear nail polish, gel polish, artificial nails, or any other type of covering on natural nails (fingers and toes) Do not bring valuables to the hospital. [facility] is not responsible for valuables/personal belongings. Put on clean/comfortable clothes.  Please brush your teeth.  Ask your nurse before applying any prescription medications to the skin.

## 2024-02-03 ENCOUNTER — Ambulatory Visit (HOSPITAL_COMMUNITY): Admission: RE | Admit: 2024-02-03 | Discharge: 2024-02-03 | Disposition: A | Source: Ambulatory Visit

## 2024-02-03 ENCOUNTER — Encounter (HOSPITAL_COMMUNITY)
Admission: RE | Admit: 2024-02-03 | Discharge: 2024-02-03 | Disposition: A | Discharge status: Home / Self Care | Source: Ambulatory Visit

## 2024-02-03 ENCOUNTER — Ambulatory Visit (HOSPITAL_COMMUNITY)
Admission: RE | Admit: 2024-02-03 | Discharge: 2024-02-03 | Disposition: A | Discharge status: Home / Self Care | Source: Ambulatory Visit

## 2024-02-03 ENCOUNTER — Encounter (HOSPITAL_COMMUNITY): Payer: Self-pay | Admitting: Physician Assistant

## 2024-02-03 ENCOUNTER — Encounter (HOSPITAL_COMMUNITY): Payer: Self-pay

## 2024-02-03 ENCOUNTER — Other Ambulatory Visit: Payer: Self-pay

## 2024-02-03 VITALS — BP 134/77 | HR 59 | Temp 98.1°F | Resp 18 | Ht 67.0 in | Wt 175.8 lb

## 2024-02-03 DIAGNOSIS — I21A9 Other myocardial infarction type: Secondary | ICD-10-CM | POA: Insufficient documentation

## 2024-02-03 DIAGNOSIS — I2510 Atherosclerotic heart disease of native coronary artery without angina pectoris: Secondary | ICD-10-CM | POA: Insufficient documentation

## 2024-02-03 DIAGNOSIS — I25110 Atherosclerotic heart disease of native coronary artery with unstable angina pectoris: Secondary | ICD-10-CM | POA: Insufficient documentation

## 2024-02-03 DIAGNOSIS — R079 Chest pain, unspecified: Secondary | ICD-10-CM | POA: Insufficient documentation

## 2024-02-03 DIAGNOSIS — Z01818 Encounter for other preprocedural examination: Secondary | ICD-10-CM

## 2024-02-03 DIAGNOSIS — Z01810 Encounter for preprocedural cardiovascular examination: Secondary | ICD-10-CM | POA: Insufficient documentation

## 2024-02-03 HISTORY — DX: Personal history of urinary calculi: Z87.442

## 2024-02-03 HISTORY — DX: Anxiety disorder, unspecified: F41.9

## 2024-02-03 HISTORY — DX: Pneumonia, unspecified organism: J18.9

## 2024-02-03 LAB — CBC
HCT: 47.2 % (ref 39.0–52.0)
Hemoglobin: 16.1 g/dL (ref 13.0–17.0)
MCH: 29.5 pg (ref 26.0–34.0)
MCHC: 34.1 g/dL (ref 30.0–36.0)
MCV: 86.4 fL (ref 80.0–100.0)
Platelets: 229 K/uL (ref 150–400)
RBC: 5.46 MIL/uL (ref 4.22–5.81)
RDW: 12.8 % (ref 11.5–15.5)
WBC: 7.8 K/uL (ref 4.0–10.5)
nRBC: 0 % (ref 0.0–0.2)

## 2024-02-03 LAB — URINALYSIS, ROUTINE W REFLEX MICROSCOPIC
Bilirubin Urine: NEGATIVE
Glucose, UA: >=500 mg/dL — AB
Hgb urine dipstick: NEGATIVE
Ketones, ur: NEGATIVE mg/dL
Leukocytes,Ua: NEGATIVE
Nitrite: NEGATIVE
Protein, ur: NEGATIVE mg/dL
Specific Gravity, Urine: 1.03 (ref 1.005–1.030)
pH: 5 (ref 5.0–8.0)

## 2024-02-03 LAB — PULMONARY FUNCTION TEST
DL/VA % pred: 98 %
DL/VA: 4.13 ml/min/mmHg/L
DLCO cor % pred: 86 %
DLCO cor: 21 ml/min/mmHg
DLCO unc % pred: 88 %
DLCO unc: 21.52 ml/min/mmHg
FEF 25-75 Pre: 2.88 L/s
FEF2575-%Pred-Pre: 117 %
FEV1-%Pred-Pre: 88 %
FEV1-Pre: 2.71 L
FEV1FVC-%Pred-Pre: 109 %
FEV6-%Pred-Pre: 85 %
FEV6-Pre: 3.32 L
FEV6FVC-%Pred-Pre: 105 %
FVC-%Pred-Pre: 80 %
FVC-Pre: 3.32 L
Pre FEV1/FVC ratio: 82 %
Pre FEV6/FVC Ratio: 100 %
RV % pred: 116 %
RV: 2.55 L
TLC % pred: 99 %
TLC: 6.4 L

## 2024-02-03 LAB — TYPE AND SCREEN
ABO/RH(D): A POS
Antibody Screen: NEGATIVE

## 2024-02-03 LAB — APTT: aPTT: 28 s (ref 24–36)

## 2024-02-03 LAB — COMPREHENSIVE METABOLIC PANEL WITH GFR
ALT: 24 U/L (ref 0–44)
AST: 23 U/L (ref 15–41)
Albumin: 4.5 g/dL (ref 3.5–5.0)
Alkaline Phosphatase: 67 U/L (ref 38–126)
Anion gap: 11 (ref 5–15)
BUN: 19 mg/dL (ref 8–23)
CO2: 27 mmol/L (ref 22–32)
Calcium: 9.5 mg/dL (ref 8.9–10.3)
Chloride: 100 mmol/L (ref 98–111)
Creatinine, Ser: 1.02 mg/dL (ref 0.61–1.24)
GFR, Estimated: >60 mL/min (ref >60)
Glucose, Bld: 171 mg/dL — ABNORMAL HIGH (ref 70–99)
Potassium: 4.3 mmol/L (ref 3.5–5.1)
Sodium: 138 mmol/L (ref 135–145)
Total Bilirubin: 0.9 mg/dL (ref 0.0–1.2)
Total Protein: 7.2 g/dL (ref 6.5–8.1)

## 2024-02-03 LAB — GLUCOSE, CAPILLARY: Glucose-Capillary: 207 mg/dL — ABNORMAL HIGH (ref 70–99)

## 2024-02-03 LAB — HEMOGLOBIN A1C
Hgb A1c MFr Bld: 8 % — ABNORMAL HIGH (ref 4.8–5.6)
Mean Plasma Glucose: 182.9 mg/dL

## 2024-02-03 LAB — PROTIME-INR
INR: 0.9 (ref 0.8–1.2)
Prothrombin Time: 13.2 s (ref 11.4–15.2)

## 2024-02-03 LAB — SURGICAL PCR SCREEN
MRSA, PCR: NEGATIVE
Staphylococcus aureus: NEGATIVE

## 2024-02-03 NOTE — Pre-Procedure Instructions (Signed)
 Surgical Instructions   Your procedure is scheduled on Wed, February 05, 2024. Report to [facility] Main Entrance A at 6:30 A.M., then check in with the Admitting office. Any questions or running late day of surgery: call [phone]  Questions prior to your surgery date: call [phone], Monday-Friday, 8am-4pm. If you experience any cold or flu symptoms such as cough, fever, chills, shortness of breath, etc. between now and your scheduled surgery, please notify us  at the above number.     Remember:  Do not eat or drink after midnight the night before your surgery    Take these medicines the morning of surgery with A SIP OF WATER: carvedilol  (COREG )  isosorbide  mononitrate (IMDUR )  PARoxetine  (PAXIL )  icosapent  Ethyl (VASCEPA )   May take these medicines IF NEEDED: nitroGLYCERIN  (NITROSTAT ) - if dose taken prior to surgery, please call [phone]   Continue taking your Aspirin  through the day before surgery. DO NOT take any the morning of surgery.   One week prior to surgery, STOP taking any Aleve, Naproxen, Ibuprofen, Motrin, Advil, Goody's, BC's, all herbal medications, fish oil, and non-prescription vitamins.   WHAT DO I DO ABOUT MY DIABETES MEDICATION?   STOP taking your metFORMIN  (GLUCOPHAGE -XR) two days prior to surgery. Your last dose will be October 12th per MD.  THE MORNING OF SURGERY, take 10 units of insulin  glargine (TOUJEO  MAX SOLOSTAR).  STOP taking your JARDIANCE  three days prior to surgery. Your last dose will be October 11th.   HOW TO MANAGE YOUR DIABETES BEFORE AND AFTER SURGERY  Why is it important to control my blood sugar before and after surgery? Improving blood sugar levels before and after surgery helps healing and can limit problems. A way of improving blood sugar control is eating a healthy diet by:  Eating less sugar and carbohydrates  Increasing activity/exercise  Talking with your doctor about reaching your blood sugar goals High  blood sugars (greater than 180 mg/dL) can raise your risk of infections and slow your recovery, so you will need to focus on controlling your diabetes during the weeks before surgery. Make sure that the doctor who takes care of your diabetes knows about your planned surgery including the date and location.  How do I manage my blood sugar before surgery? Check your blood sugar at least 4 times a day, starting 2 days before surgery, to make sure that the level is not too high or low.  Check your blood sugar the morning of your surgery when you wake up and every 2 hours until you get to the Short Stay unit.  If your blood sugar is less than 70 mg/dL, you will need to treat for low blood sugar: Do not take insulin . Treat a low blood sugar (less than 70 mg/dL) with  cup of clear juice (cranberry or apple), 4 glucose tablets, OR glucose gel. Recheck blood sugar in 15 minutes after treatment (to make sure it is greater than 70 mg/dL). If your blood sugar is not greater than 70 mg/dL on recheck, call [phone] for further instructions. Report your blood sugar to the short stay nurse when you get to Short Stay.  If you are admitted to the hospital after surgery: Your blood sugar will be checked by the staff and you will probably be given insulin  after surgery (instead of oral diabetes medicines) to make sure you have good blood sugar levels. The goal for blood sugar control after surgery is 80-180 mg/dL.  Do NOT Smoke (Tobacco/Vaping) for 24 hours prior to your procedure.  If you use a CPAP at night, you may bring your mask/headgear for your overnight stay.   You will be asked to remove any contacts, glasses, piercing's, hearing aid's, dentures/partials prior to surgery. Please bring cases for these items if needed.    Patients discharged the day of surgery will not be allowed to drive home, and someone needs to stay with them for 24 hours.  SURGICAL WAITING ROOM  VISITATION Patients may have no more than 2 support people in the waiting area - these visitors may rotate.   Pre-op nurse will coordinate an appropriate time for 1 ADULT support person, who may not rotate, to accompany patient in pre-op.  Children under the age of 36 must have an adult with them who is not the patient and must remain in the main waiting area with an adult.  If the patient needs to stay at the hospital during part of their recovery, the visitor guidelines for inpatient rooms apply.  Please refer to the [facility] website for the visitor guidelines for any additional information.   If you received a COVID test during your pre-op visit  it is requested that you wear a mask when out in public, stay away from anyone that may not be feeling well and notify your surgeon if you develop symptoms. If you have been in contact with anyone that has tested positive in the last 10 days please notify you surgeon.      Pre-operative CHG Bathing Instructions   You can play a key role in reducing the risk of infection after surgery. Your skin needs to be as free of germs as possible. You can reduce the number of germs on your skin by washing with CHG (chlorhexidine  gluconate) soap before surgery. CHG is an antiseptic soap that kills germs and continues to kill germs even after washing.   DO NOT use if you have an allergy to chlorhexidine /CHG or antibacterial soaps. If your skin becomes reddened or irritated, stop using the CHG and notify one of our RNs at [phone].              TAKE A SHOWER THE NIGHT BEFORE SURGERY   Please keep in mind the following:  DO NOT shave, including legs and underarms, 48 hours prior to surgery.   You may shave your face before/day of surgery.  Place clean sheets on your bed the night before surgery Use a clean washcloth (not used since being washed) for shower. DO NOT sleep with pet's night before surgery.  CHG Shower Instructions:  Wash your face and  private area with normal soap. If you choose to wash your hair, wash first with your normal shampoo.  After you use shampoo/soap, rinse your hair and body thoroughly to remove shampoo/soap residue.  Turn the water OFF and apply half the bottle of CHG soap to a CLEAN washcloth.  Apply CHG soap ONLY FROM YOUR NECK DOWN TO YOUR TOES (washing for 3-5 minutes)  DO NOT use CHG soap on face, private areas, open wounds, or sores.  Pay special attention to the area where your surgery is being performed.  If you are having back surgery, having someone wash your back for you may be helpful. Wait 2 minutes after CHG soap is applied, then you may rinse off the CHG soap.  Pat dry with a clean towel  Put on clean pajamas    Additional instructions for the day  of surgery: If you choose, you may shower the morning of surgery with an antibacterial soap.  DO NOT APPLY any lotions, deodorants, cologne, or perfumes.   Do not wear jewelry or makeup Do not wear nail polish, gel polish, artificial nails, or any other type of covering on natural nails (fingers and toes) Do not bring valuables to the hospital. [facility] is not responsible for valuables/personal belongings. Put on clean/comfortable clothes.  Please brush your teeth.  Ask your nurse before applying any prescription medications to the skin.

## 2024-02-03 NOTE — Pre-Procedure Instructions (Signed)
 Surgical Instructions   Your procedure is scheduled on February 05, 2024. Report to [facility] Main Entrance A at 6:30 A.M., then check in with the Admitting office. Any questions or running late day of surgery: call [phone]  Questions prior to your surgery date: call [phone], Monday-Friday, 8am-4pm. If you experience any cold or flu symptoms such as cough, fever, chills, shortness of breath, etc. between now and your scheduled surgery, please notify us  at the above number.     Remember:  Do not eat or drink after midnight the night before your surgery    Take these medicines the morning of surgery with A SIP OF WATER: carvedilol  (COREG )  isosorbide  mononitrate (IMDUR )  PARoxetine  (PAXIL )  icosapent  Ethyl (VASCEPA )   May take these medicines IF NEEDED: nitroGLYCERIN  (NITROSTAT ) - if dose taken prior to surgery, please call [phone]   Continue taking your Aspirin  through the day before surgery. DO NOT take any the morning of surgery.   One week prior to surgery, STOP taking any Aleve, Naproxen, Ibuprofen, Motrin, Advil, Goody's, BC's, all herbal medications, fish oil, and non-prescription vitamins.   WHAT DO I DO ABOUT MY DIABETES MEDICATION?   STOP taking your metFORMIN  (GLUCOPHAGE -XR) two days prior to surgery. Your last dose will be October 12th per MD.  THE MORNING OF SURGERY, take 10 units of insulin  glargine (TOUJEO  MAX SOLOSTAR).  STOP taking your JARDIANCE  three days prior to surgery. Your last dose will be October 11th.   HOW TO MANAGE YOUR DIABETES BEFORE AND AFTER SURGERY  Why is it important to control my blood sugar before and after surgery? Improving blood sugar levels before and after surgery helps healing and can limit problems. A way of improving blood sugar control is eating a healthy diet by:  Eating less sugar and carbohydrates  Increasing activity/exercise  Talking with your doctor about reaching your blood sugar goals High blood  sugars (greater than 180 mg/dL) can raise your risk of infections and slow your recovery, so you will need to focus on controlling your diabetes during the weeks before surgery. Make sure that the doctor who takes care of your diabetes knows about your planned surgery including the date and location.  How do I manage my blood sugar before surgery? Check your blood sugar at least 4 times a day, starting 2 days before surgery, to make sure that the level is not too high or low.  Check your blood sugar the morning of your surgery when you wake up and every 2 hours until you get to the Short Stay unit.  If your blood sugar is less than 70 mg/dL, you will need to treat for low blood sugar: Do not take insulin . Treat a low blood sugar (less than 70 mg/dL) with  cup of clear juice (cranberry or apple), 4 glucose tablets, OR glucose gel. Recheck blood sugar in 15 minutes after treatment (to make sure it is greater than 70 mg/dL). If your blood sugar is not greater than 70 mg/dL on recheck, call [phone] for further instructions. Report your blood sugar to the short stay nurse when you get to Short Stay.  If you are admitted to the hospital after surgery: Your blood sugar will be checked by the staff and you will probably be given insulin  after surgery (instead of oral diabetes medicines) to make sure you have good blood sugar levels. The goal for blood sugar control after surgery is 80-180 mg/dL.  Do NOT Smoke (Tobacco/Vaping) for 24 hours prior to your procedure.  If you use a CPAP at night, you may bring your mask/headgear for your overnight stay.   You will be asked to remove any contacts, glasses, piercing's, hearing aid's, dentures/partials prior to surgery. Please bring cases for these items if needed.    Patients discharged the day of surgery will not be allowed to drive home, and someone needs to stay with them for 24 hours.  SURGICAL WAITING ROOM  VISITATION Patients may have no more than 2 support people in the waiting area - these visitors may rotate.   Pre-op nurse will coordinate an appropriate time for 1 ADULT support person, who may not rotate, to accompany patient in pre-op.  Children under the age of 15 must have an adult with them who is not the patient and must remain in the main waiting area with an adult.  If the patient needs to stay at the hospital during part of their recovery, the visitor guidelines for inpatient rooms apply.  Please refer to the [facility] website for the visitor guidelines for any additional information.   If you received a COVID test during your pre-op visit  it is requested that you wear a mask when out in public, stay away from anyone that may not be feeling well and notify your surgeon if you develop symptoms. If you have been in contact with anyone that has tested positive in the last 10 days please notify you surgeon.      Pre-operative CHG Bathing Instructions   You can play a key role in reducing the risk of infection after surgery. Your skin needs to be as free of germs as possible. You can reduce the number of germs on your skin by washing with CHG (chlorhexidine  gluconate) soap before surgery. CHG is an antiseptic soap that kills germs and continues to kill germs even after washing.   DO NOT use if you have an allergy to chlorhexidine /CHG or antibacterial soaps. If your skin becomes reddened or irritated, stop using the CHG and notify one of our RNs at [phone].              TAKE A SHOWER THE NIGHT BEFORE SURGERY   Please keep in mind the following:  DO NOT shave, including legs and underarms, 48 hours prior to surgery.   You may shave your face before/day of surgery.  Place clean sheets on your bed the night before surgery Use a clean washcloth (not used since being washed) for shower. DO NOT sleep with pet's night before surgery.  CHG Shower Instructions:  Wash your face and  private area with normal soap. If you choose to wash your hair, wash first with your normal shampoo.  After you use shampoo/soap, rinse your hair and body thoroughly to remove shampoo/soap residue.  Turn the water OFF and apply half the bottle of CHG soap to a CLEAN washcloth.  Apply CHG soap ONLY FROM YOUR NECK DOWN TO YOUR TOES (washing for 3-5 minutes)  DO NOT use CHG soap on face, private areas, open wounds, or sores.  Pay special attention to the area where your surgery is being performed.  If you are having back surgery, having someone wash your back for you may be helpful. Wait 2 minutes after CHG soap is applied, then you may rinse off the CHG soap.  Pat dry with a clean towel  Put on clean pajamas    Additional instructions for the day  of surgery: If you choose, you may shower the morning of surgery with an antibacterial soap.  DO NOT APPLY any lotions, deodorants, cologne, or perfumes.   Do not wear jewelry or makeup Do not wear nail polish, gel polish, artificial nails, or any other type of covering on natural nails (fingers and toes) Do not bring valuables to the hospital. [facility] is not responsible for valuables/personal belongings. Put on clean/comfortable clothes.  Please brush your teeth.  Ask your nurse before applying any prescription medications to the skin.

## 2024-02-04 MED ORDER — MILRINONE LACTATE IN DEXTROSE 20-5 MG/100ML-% IV SOLN
0.3000 ug/kg/min | INTRAVENOUS | Status: DC
  Filled 2024-02-04: qty 100

## 2024-02-04 MED ORDER — PLASMA-LYTE A IV SOLN
INTRAVENOUS | Status: DC
  Filled 2024-02-04 (×2): qty 2.5

## 2024-02-04 MED ORDER — HEPARIN 30,000 UNITS/1000 ML (OHS) CELLSAVER SOLUTION
Status: DC
  Filled 2024-02-04: qty 1000

## 2024-02-04 MED ORDER — MANNITOL 20 % IV SOLN
INTRAVENOUS | Status: DC
  Filled 2024-02-04: qty 13

## 2024-02-04 MED ORDER — TRANEXAMIC ACID 1000 MG/10ML IV SOLN
1.5000 mg/kg/h | INTRAVENOUS | Status: AC
  Administered 2024-02-05: 1.5 mg/kg/h via INTRAVENOUS
  Filled 2024-02-04: qty 25

## 2024-02-04 MED ORDER — NOREPINEPHRINE 4 MG/250ML-% IV SOLN
0.0000 ug/min | INTRAVENOUS | Status: AC
  Administered 2024-02-05: 2 ug/min via INTRAVENOUS
  Filled 2024-02-04: qty 250

## 2024-02-04 MED ORDER — TRANEXAMIC ACID (OHS) BOLUS VIA INFUSION
15.0000 mg/kg | INTRAVENOUS | Status: AC
  Administered 2024-02-05: 1195.5 mg via INTRAVENOUS
  Filled 2024-02-04: qty 1196

## 2024-02-04 MED ORDER — NITROGLYCERIN IN D5W 200-5 MCG/ML-% IV SOLN
2.0000 ug/min | INTRAVENOUS | Status: DC
  Filled 2024-02-04: qty 250

## 2024-02-04 MED ORDER — DEXMEDETOMIDINE HCL IN NACL 400 MCG/100ML IV SOLN
0.1000 ug/kg/h | INTRAVENOUS | Status: AC
  Administered 2024-02-05: .5 ug/kg/h via INTRAVENOUS
  Filled 2024-02-04: qty 100

## 2024-02-04 MED ORDER — CEFAZOLIN SODIUM-DEXTROSE 2-4 GM/100ML-% IV SOLN
2.0000 g | INTRAVENOUS | Status: AC
  Administered 2024-02-05: 2 g via INTRAVENOUS
  Filled 2024-02-04: qty 100

## 2024-02-04 MED ORDER — INSULIN REGULAR(HUMAN) IN NACL 100-0.9 UT/100ML-% IV SOLN
INTRAVENOUS | Status: AC
  Administered 2024-02-05: 5 [IU]/h via INTRAVENOUS
  Filled 2024-02-04: qty 100

## 2024-02-04 MED ORDER — VANCOMYCIN HCL 1250 MG/250ML IV SOLN
1250.0000 mg | INTRAVENOUS | Status: AC
  Administered 2024-02-05: 1250 mg via INTRAVENOUS
  Filled 2024-02-04: qty 250

## 2024-02-04 MED ORDER — TRANEXAMIC ACID (OHS) PUMP PRIME SOLUTION
2.0000 mg/kg | INTRAVENOUS | Status: DC
  Filled 2024-02-04: qty 1.59

## 2024-02-04 MED ORDER — POTASSIUM CHLORIDE 2 MEQ/ML IV SOLN
80.0000 meq | INTRAVENOUS | Status: DC
  Filled 2024-02-04: qty 40

## 2024-02-04 MED ORDER — EPINEPHRINE HCL 5 MG/250ML IV SOLN IN NS
0.0000 ug/min | INTRAVENOUS | Status: AC
  Administered 2024-02-05: 1 ug/min via INTRAVENOUS
  Filled 2024-02-04: qty 250

## 2024-02-04 MED ORDER — PHENYLEPHRINE HCL-NACL 20-0.9 MG/250ML-% IV SOLN
30.0000 ug/min | INTRAVENOUS | Status: AC
Start: 2024-02-05 — End: 2024-02-06
  Administered 2024-02-05: 25 ug/min via INTRAVENOUS
  Filled 2024-02-04: qty 250

## 2024-02-05 ENCOUNTER — Encounter (HOSPITAL_COMMUNITY): Payer: Self-pay

## 2024-02-05 ENCOUNTER — Inpatient Hospital Stay (HOSPITAL_COMMUNITY): Admission: RE | Admit: 2024-02-05 | Discharge: 2024-02-10 | DRG: 236 | Disposition: A | Discharge status: Home Health

## 2024-02-05 ENCOUNTER — Inpatient Hospital Stay (HOSPITAL_COMMUNITY)

## 2024-02-05 ENCOUNTER — Other Ambulatory Visit: Payer: Self-pay

## 2024-02-05 ENCOUNTER — Inpatient Hospital Stay (HOSPITAL_COMMUNITY): Admission: RE | Disposition: A | Discharge status: Home / Self Care | Payer: Self-pay | Source: Home / Self Care

## 2024-02-05 ENCOUNTER — Inpatient Hospital Stay (HOSPITAL_COMMUNITY): Payer: Self-pay

## 2024-02-05 DIAGNOSIS — Z79899 Other long term (current) drug therapy: Secondary | ICD-10-CM

## 2024-02-05 DIAGNOSIS — D6959 Other secondary thrombocytopenia: Secondary | ICD-10-CM | POA: Diagnosis not present

## 2024-02-05 DIAGNOSIS — I10 Essential (primary) hypertension: Secondary | ICD-10-CM

## 2024-02-05 DIAGNOSIS — I2510 Atherosclerotic heart disease of native coronary artery without angina pectoris: Secondary | ICD-10-CM

## 2024-02-05 DIAGNOSIS — I110 Hypertensive heart disease with heart failure: Secondary | ICD-10-CM | POA: Diagnosis present

## 2024-02-05 DIAGNOSIS — I493 Ventricular premature depolarization: Secondary | ICD-10-CM | POA: Diagnosis present

## 2024-02-05 DIAGNOSIS — Z951 Presence of aortocoronary bypass graft: Secondary | ICD-10-CM | POA: Diagnosis not present

## 2024-02-05 DIAGNOSIS — T457X5A Adverse effect of anticoagulant antagonists, vitamin K and other coagulants, initial encounter: Secondary | ICD-10-CM | POA: Diagnosis not present

## 2024-02-05 DIAGNOSIS — I959 Hypotension, unspecified: Secondary | ICD-10-CM | POA: Diagnosis not present

## 2024-02-05 DIAGNOSIS — Z7982 Long term (current) use of aspirin: Secondary | ICD-10-CM | POA: Diagnosis not present

## 2024-02-05 DIAGNOSIS — F32A Depression, unspecified: Secondary | ICD-10-CM | POA: Diagnosis present

## 2024-02-05 DIAGNOSIS — T782XXA Anaphylactic shock, unspecified, initial encounter: Secondary | ICD-10-CM | POA: Diagnosis not present

## 2024-02-05 DIAGNOSIS — I252 Old myocardial infarction: Secondary | ICD-10-CM | POA: Diagnosis not present

## 2024-02-05 DIAGNOSIS — I25110 Atherosclerotic heart disease of native coronary artery with unstable angina pectoris: Secondary | ICD-10-CM

## 2024-02-05 DIAGNOSIS — E1151 Type 2 diabetes mellitus with diabetic peripheral angiopathy without gangrene: Secondary | ICD-10-CM | POA: Diagnosis present

## 2024-02-05 DIAGNOSIS — E876 Hypokalemia: Secondary | ICD-10-CM | POA: Diagnosis present

## 2024-02-05 DIAGNOSIS — G8918 Other acute postprocedural pain: Secondary | ICD-10-CM | POA: Diagnosis not present

## 2024-02-05 DIAGNOSIS — T886XXA Anaphylactic reaction due to adverse effect of correct drug or medicament properly administered, initial encounter: Secondary | ICD-10-CM | POA: Diagnosis not present

## 2024-02-05 DIAGNOSIS — R0689 Other abnormalities of breathing: Secondary | ICD-10-CM | POA: Diagnosis not present

## 2024-02-05 DIAGNOSIS — I25118 Atherosclerotic heart disease of native coronary artery with other forms of angina pectoris: Secondary | ICD-10-CM | POA: Diagnosis present

## 2024-02-05 DIAGNOSIS — E781 Pure hyperglyceridemia: Secondary | ICD-10-CM | POA: Diagnosis present

## 2024-02-05 DIAGNOSIS — N400 Enlarged prostate without lower urinary tract symptoms: Secondary | ICD-10-CM | POA: Diagnosis present

## 2024-02-05 DIAGNOSIS — R079 Chest pain, unspecified: Secondary | ICD-10-CM

## 2024-02-05 DIAGNOSIS — Z8249 Family history of ischemic heart disease and other diseases of the circulatory system: Secondary | ICD-10-CM | POA: Diagnosis not present

## 2024-02-05 DIAGNOSIS — Z7984 Long term (current) use of oral hypoglycemic drugs: Secondary | ICD-10-CM | POA: Diagnosis not present

## 2024-02-05 DIAGNOSIS — Z87891 Personal history of nicotine dependence: Secondary | ICD-10-CM

## 2024-02-05 DIAGNOSIS — Z888 Allergy status to other drugs, medicaments and biological substances status: Secondary | ICD-10-CM

## 2024-02-05 DIAGNOSIS — Z955 Presence of coronary angioplasty implant and graft: Secondary | ICD-10-CM

## 2024-02-05 DIAGNOSIS — I5022 Chronic systolic (congestive) heart failure: Secondary | ICD-10-CM | POA: Diagnosis present

## 2024-02-05 DIAGNOSIS — E119 Type 2 diabetes mellitus without complications: Secondary | ICD-10-CM | POA: Diagnosis not present

## 2024-02-05 DIAGNOSIS — Z794 Long term (current) use of insulin: Secondary | ICD-10-CM

## 2024-02-05 HISTORY — PX: RADIAL ARTERY HARVEST: SHX5067

## 2024-02-05 HISTORY — PX: CORONARY ARTERY BYPASS GRAFT: SHX141

## 2024-02-05 HISTORY — PX: TEE WITHOUT CARDIOVERSION: SHX5443

## 2024-02-05 LAB — POCT I-STAT 7, (LYTES, BLD GAS, ICA,H+H)
Acid-Base Excess: 0 mmol/L (ref 0.0–2.0)
Acid-Base Excess: 0 mmol/L (ref 0.0–2.0)
Acid-base deficit: 2 mmol/L (ref 0.0–2.0)
Acid-base deficit: 3 mmol/L — ABNORMAL HIGH (ref 0.0–2.0)
Acid-base deficit: 4 mmol/L — ABNORMAL HIGH (ref 0.0–2.0)
Acid-base deficit: 6 mmol/L — ABNORMAL HIGH (ref 0.0–2.0)
Acid-base deficit: 5 mmol/L — ABNORMAL HIGH (ref 0.0–2.0)
Acid-base deficit: 8 mmol/L — ABNORMAL HIGH (ref 0.0–2.0)
Acid-base deficit: 4 mmol/L — ABNORMAL HIGH (ref 0.0–2.0)
Bicarbonate: 26.5 mmol/L (ref 20.0–28.0)
Bicarbonate: 24.8 mmol/L (ref 20.0–28.0)
Bicarbonate: 22.9 mmol/L (ref 20.0–28.0)
Bicarbonate: 22.8 mmol/L (ref 20.0–28.0)
Bicarbonate: 20 mmol/L (ref 20.0–28.0)
Bicarbonate: 21.3 mmol/L (ref 20.0–28.0)
Bicarbonate: 18.7 mmol/L — ABNORMAL LOW (ref 20.0–28.0)
Bicarbonate: 17.4 mmol/L — ABNORMAL LOW (ref 20.0–28.0)
Bicarbonate: 21.9 mmol/L (ref 20.0–28.0)
Calcium, Ion: 1.25 mmol/L (ref 1.15–1.40)
Calcium, Ion: 1 mmol/L — ABNORMAL LOW (ref 1.15–1.40)
Calcium, Ion: 1.03 mmol/L — ABNORMAL LOW (ref 1.15–1.40)
Calcium, Ion: 1.19 mmol/L (ref 1.15–1.40)
Calcium, Ion: 1.18 mmol/L (ref 1.15–1.40)
Calcium, Ion: 1.19 mmol/L (ref 1.15–1.40)
Calcium, Ion: 1.07 mmol/L — ABNORMAL LOW (ref 1.15–1.40)
Calcium, Ion: 1.09 mmol/L — ABNORMAL LOW (ref 1.15–1.40)
Calcium, Ion: 1.22 mmol/L (ref 1.15–1.40)
HCT: 36 % — ABNORMAL LOW (ref 39.0–52.0)
HCT: 26 % — ABNORMAL LOW (ref 39.0–52.0)
HCT: 24 % — ABNORMAL LOW (ref 39.0–52.0)
HCT: 26 % — ABNORMAL LOW (ref 39.0–52.0)
HCT: 30 % — ABNORMAL LOW (ref 39.0–52.0)
HCT: 30 % — ABNORMAL LOW (ref 39.0–52.0)
HCT: 29 % — ABNORMAL LOW (ref 39.0–52.0)
HCT: 24 % — ABNORMAL LOW (ref 39.0–52.0)
HCT: 28 % — ABNORMAL LOW (ref 39.0–52.0)
Hemoglobin: 12.2 g/dL — ABNORMAL LOW (ref 13.0–17.0)
Hemoglobin: 8.8 g/dL — ABNORMAL LOW (ref 13.0–17.0)
Hemoglobin: 8.2 g/dL — ABNORMAL LOW (ref 13.0–17.0)
Hemoglobin: 8.8 g/dL — ABNORMAL LOW (ref 13.0–17.0)
Hemoglobin: 10.2 g/dL — ABNORMAL LOW (ref 13.0–17.0)
Hemoglobin: 10.2 g/dL — ABNORMAL LOW (ref 13.0–17.0)
Hemoglobin: 9.9 g/dL — ABNORMAL LOW (ref 13.0–17.0)
Hemoglobin: 8.2 g/dL — ABNORMAL LOW (ref 13.0–17.0)
Hemoglobin: 9.5 g/dL — ABNORMAL LOW (ref 13.0–17.0)
O2 Saturation: 100 %
O2 Saturation: 100 %
O2 Saturation: 100 %
O2 Saturation: 87 %
O2 Saturation: 90 %
O2 Saturation: 99 %
O2 Saturation: 97 %
O2 Saturation: 98 %
O2 Saturation: 97 %
Patient temperature: 35.9
Patient temperature: 98
Patient temperature: 98
Potassium: 4 mmol/L (ref 3.5–5.1)
Potassium: 3.6 mmol/L (ref 3.5–5.1)
Potassium: 3.9 mmol/L (ref 3.5–5.1)
Potassium: 3.4 mmol/L — ABNORMAL LOW (ref 3.5–5.1)
Potassium: 3.1 mmol/L — ABNORMAL LOW (ref 3.5–5.1)
Potassium: 3.2 mmol/L — ABNORMAL LOW (ref 3.5–5.1)
Potassium: 2.8 mmol/L — ABNORMAL LOW (ref 3.5–5.1)
Potassium: 3.2 mmol/L — ABNORMAL LOW (ref 3.5–5.1)
Potassium: 3.8 mmol/L (ref 3.5–5.1)
Sodium: 139 mmol/L (ref 135–145)
Sodium: 139 mmol/L (ref 135–145)
Sodium: 138 mmol/L (ref 135–145)
Sodium: 143 mmol/L (ref 135–145)
Sodium: 142 mmol/L (ref 135–145)
Sodium: 142 mmol/L (ref 135–145)
Sodium: 144 mmol/L (ref 135–145)
Sodium: 144 mmol/L (ref 135–145)
Sodium: 140 mmol/L (ref 135–145)
TCO2: 28 mmol/L (ref 22–32)
TCO2: 26 mmol/L (ref 22–32)
TCO2: 24 mmol/L (ref 22–32)
TCO2: 24 mmol/L (ref 22–32)
TCO2: 21 mmol/L — ABNORMAL LOW (ref 22–32)
TCO2: 22 mmol/L (ref 22–32)
TCO2: 20 mmol/L — ABNORMAL LOW (ref 22–32)
TCO2: 18 mmol/L — ABNORMAL LOW (ref 22–32)
TCO2: 23 mmol/L (ref 22–32)
pCO2 arterial: 52 mmHg — ABNORMAL HIGH (ref 32–48)
pCO2 arterial: 39.8 mmHg (ref 32–48)
pCO2 arterial: 36.8 mmHg (ref 32–48)
pCO2 arterial: 43.5 mmHg (ref 32–48)
pCO2 arterial: 38.6 mmHg (ref 32–48)
pCO2 arterial: 39.1 mmHg (ref 32–48)
pCO2 arterial: 27.6 mmHg — ABNORMAL LOW (ref 32–48)
pCO2 arterial: 33.5 mmHg (ref 32–48)
pCO2 arterial: 43.8 mmHg (ref 32–48)
pH, Arterial: 7.315 — ABNORMAL LOW (ref 7.35–7.45)
pH, Arterial: 7.402 (ref 7.35–7.45)
pH, Arterial: 7.401 (ref 7.35–7.45)
pH, Arterial: 7.327 — ABNORMAL LOW (ref 7.35–7.45)
pH, Arterial: 7.322 — ABNORMAL LOW (ref 7.35–7.45)
pH, Arterial: 7.344 — ABNORMAL LOW (ref 7.35–7.45)
pH, Arterial: 7.435 (ref 7.35–7.45)
pH, Arterial: 7.322 — ABNORMAL LOW (ref 7.35–7.45)
pH, Arterial: 7.307 — ABNORMAL LOW (ref 7.35–7.45)
pO2, Arterial: 300 mmHg — ABNORMAL HIGH (ref 83–108)
pO2, Arterial: 373 mmHg — ABNORMAL HIGH (ref 83–108)
pO2, Arterial: 258 mmHg — ABNORMAL HIGH (ref 83–108)
pO2, Arterial: 57 mmHg — ABNORMAL LOW (ref 83–108)
pO2, Arterial: 166 mmHg — ABNORMAL HIGH (ref 83–108)
pO2, Arterial: 63 mmHg — ABNORMAL LOW (ref 83–108)
pO2, Arterial: 79 mmHg — ABNORMAL LOW (ref 83–108)
pO2, Arterial: 103 mmHg (ref 83–108)
pO2, Arterial: 96 mmHg (ref 83–108)

## 2024-02-05 LAB — FIBRINOGEN: Fibrinogen: 182 mg/dL — ABNORMAL LOW (ref 210–475)

## 2024-02-05 LAB — CBC
HCT: 35.7 % — ABNORMAL LOW (ref 39.0–52.0)
HCT: 31.8 % — ABNORMAL LOW (ref 39.0–52.0)
Hemoglobin: 12.4 g/dL — ABNORMAL LOW (ref 13.0–17.0)
Hemoglobin: 10.9 g/dL — ABNORMAL LOW (ref 13.0–17.0)
MCH: 29.5 pg (ref 26.0–34.0)
MCH: 29.9 pg (ref 26.0–34.0)
MCHC: 34.7 g/dL (ref 30.0–36.0)
MCHC: 34.3 g/dL (ref 30.0–36.0)
MCV: 84.8 fL (ref 80.0–100.0)
MCV: 87.1 fL (ref 80.0–100.0)
Platelets: 172 K/uL (ref 150–400)
Platelets: 159 K/uL (ref 150–400)
RBC: 4.21 MIL/uL — ABNORMAL LOW (ref 4.22–5.81)
RBC: 3.65 MIL/uL — ABNORMAL LOW (ref 4.22–5.81)
RDW: 12.9 % (ref 11.5–15.5)
RDW: 12.9 % (ref 11.5–15.5)
WBC: 13.5 K/uL — ABNORMAL HIGH (ref 4.0–10.5)
WBC: 12.4 K/uL — ABNORMAL HIGH (ref 4.0–10.5)
nRBC: 0 % (ref 0.0–0.2)
nRBC: 0 % (ref 0.0–0.2)

## 2024-02-05 LAB — POCT I-STAT, CHEM 8
BUN: 26 mg/dL — ABNORMAL HIGH (ref 8–23)
BUN: 25 mg/dL — ABNORMAL HIGH (ref 8–23)
BUN: 30 mg/dL — ABNORMAL HIGH (ref 8–23)
BUN: 22 mg/dL (ref 8–23)
BUN: 21 mg/dL (ref 8–23)
BUN: 18 mg/dL (ref 8–23)
Calcium, Ion: 1.27 mmol/L (ref 1.15–1.40)
Calcium, Ion: 1.19 mmol/L (ref 1.15–1.40)
Calcium, Ion: 1.26 mmol/L (ref 1.15–1.40)
Calcium, Ion: 1.09 mmol/L — ABNORMAL LOW (ref 1.15–1.40)
Calcium, Ion: 1.1 mmol/L — ABNORMAL LOW (ref 1.15–1.40)
Calcium, Ion: 1.21 mmol/L (ref 1.15–1.40)
Chloride: 104 mmol/L (ref 98–111)
Chloride: 103 mmol/L (ref 98–111)
Chloride: 105 mmol/L (ref 98–111)
Chloride: 101 mmol/L (ref 98–111)
Chloride: 101 mmol/L (ref 98–111)
Chloride: 107 mmol/L (ref 98–111)
Creatinine, Ser: 0.9 mg/dL (ref 0.61–1.24)
Creatinine, Ser: 0.8 mg/dL (ref 0.61–1.24)
Creatinine, Ser: 0.9 mg/dL (ref 0.61–1.24)
Creatinine, Ser: 0.8 mg/dL (ref 0.61–1.24)
Creatinine, Ser: 0.8 mg/dL (ref 0.61–1.24)
Creatinine, Ser: 0.7 mg/dL (ref 0.61–1.24)
Glucose, Bld: 167 mg/dL — ABNORMAL HIGH (ref 70–99)
Glucose, Bld: 131 mg/dL — ABNORMAL HIGH (ref 70–99)
Glucose, Bld: 147 mg/dL — ABNORMAL HIGH (ref 70–99)
Glucose, Bld: 105 mg/dL — ABNORMAL HIGH (ref 70–99)
Glucose, Bld: 133 mg/dL — ABNORMAL HIGH (ref 70–99)
Glucose, Bld: 123 mg/dL — ABNORMAL HIGH (ref 70–99)
HCT: 34 % — ABNORMAL LOW (ref 39.0–52.0)
HCT: 33 % — ABNORMAL LOW (ref 39.0–52.0)
HCT: 34 % — ABNORMAL LOW (ref 39.0–52.0)
HCT: 26 % — ABNORMAL LOW (ref 39.0–52.0)
HCT: 25 % — ABNORMAL LOW (ref 39.0–52.0)
HCT: 27 % — ABNORMAL LOW (ref 39.0–52.0)
Hemoglobin: 11.6 g/dL — ABNORMAL LOW (ref 13.0–17.0)
Hemoglobin: 11.2 g/dL — ABNORMAL LOW (ref 13.0–17.0)
Hemoglobin: 11.6 g/dL — ABNORMAL LOW (ref 13.0–17.0)
Hemoglobin: 8.8 g/dL — ABNORMAL LOW (ref 13.0–17.0)
Hemoglobin: 8.5 g/dL — ABNORMAL LOW (ref 13.0–17.0)
Hemoglobin: 9.2 g/dL — ABNORMAL LOW (ref 13.0–17.0)
Potassium: 4.1 mmol/L (ref 3.5–5.1)
Potassium: 3.8 mmol/L (ref 3.5–5.1)
Potassium: 4 mmol/L (ref 3.5–5.1)
Potassium: 3.6 mmol/L (ref 3.5–5.1)
Potassium: 4.3 mmol/L (ref 3.5–5.1)
Potassium: 3.2 mmol/L — ABNORMAL LOW (ref 3.5–5.1)
Sodium: 139 mmol/L (ref 135–145)
Sodium: 140 mmol/L (ref 135–145)
Sodium: 141 mmol/L (ref 135–145)
Sodium: 139 mmol/L (ref 135–145)
Sodium: 139 mmol/L (ref 135–145)
Sodium: 142 mmol/L (ref 135–145)
TCO2: 28 mmol/L (ref 22–32)
TCO2: 26 mmol/L (ref 22–32)
TCO2: 27 mmol/L (ref 22–32)
TCO2: 26 mmol/L (ref 22–32)
TCO2: 26 mmol/L (ref 22–32)
TCO2: 21 mmol/L — ABNORMAL LOW (ref 22–32)

## 2024-02-05 LAB — BASIC METABOLIC PANEL WITH GFR
Anion gap: 7 (ref 5–15)
Anion gap: 7 (ref 5–15)
BUN: 15 mg/dL (ref 8–23)
BUN: 16 mg/dL (ref 8–23)
CO2: 19 mmol/L — ABNORMAL LOW (ref 22–32)
CO2: 20 mmol/L — ABNORMAL LOW (ref 22–32)
Calcium: 6.7 mg/dL — ABNORMAL LOW (ref 8.9–10.3)
Calcium: 7.1 mg/dL — ABNORMAL LOW (ref 8.9–10.3)
Chloride: 112 mmol/L — ABNORMAL HIGH (ref 98–111)
Chloride: 110 mmol/L (ref 98–111)
Creatinine, Ser: 0.68 mg/dL (ref 0.61–1.24)
Creatinine, Ser: 0.83 mg/dL (ref 0.61–1.24)
GFR, Estimated: >60 mL/min (ref >60)
GFR, Estimated: >60 mL/min (ref >60)
Glucose, Bld: 131 mg/dL — ABNORMAL HIGH (ref 70–99)
Glucose, Bld: 165 mg/dL — ABNORMAL HIGH (ref 70–99)
Potassium: 3.1 mmol/L — ABNORMAL LOW (ref 3.5–5.1)
Potassium: 3.6 mmol/L (ref 3.5–5.1)
Sodium: 138 mmol/L (ref 135–145)
Sodium: 137 mmol/L (ref 135–145)

## 2024-02-05 LAB — GLUCOSE, CAPILLARY
Glucose-Capillary: 173 mg/dL — ABNORMAL HIGH (ref 70–99)
Glucose-Capillary: 111 mg/dL — ABNORMAL HIGH (ref 70–99)
Glucose-Capillary: 100 mg/dL — ABNORMAL HIGH (ref 70–99)
Glucose-Capillary: 149 mg/dL — ABNORMAL HIGH (ref 70–99)
Glucose-Capillary: 141 mg/dL — ABNORMAL HIGH (ref 70–99)
Glucose-Capillary: 163 mg/dL — ABNORMAL HIGH (ref 70–99)
Glucose-Capillary: 133 mg/dL — ABNORMAL HIGH (ref 70–99)

## 2024-02-05 LAB — POCT I-STAT EG7
Acid-Base Excess: 1 mmol/L (ref 0.0–2.0)
Bicarbonate: 26.4 mmol/L (ref 20.0–28.0)
Calcium, Ion: 1.06 mmol/L — ABNORMAL LOW (ref 1.15–1.40)
HCT: 26 % — ABNORMAL LOW (ref 39.0–52.0)
Hemoglobin: 8.8 g/dL — ABNORMAL LOW (ref 13.0–17.0)
O2 Saturation: 82 %
Potassium: 3.4 mmol/L — ABNORMAL LOW (ref 3.5–5.1)
Sodium: 140 mmol/L (ref 135–145)
TCO2: 28 mmol/L (ref 22–32)
pCO2, Ven: 44.7 mmHg (ref 44–60)
pH, Ven: 7.379 (ref 7.25–7.43)
pO2, Ven: 47 mmHg — ABNORMAL HIGH (ref 32–45)

## 2024-02-05 LAB — PROTIME-INR
INR: 1.4 — ABNORMAL HIGH (ref 0.8–1.2)
Prothrombin Time: 18.1 s — ABNORMAL HIGH (ref 11.4–15.2)

## 2024-02-05 LAB — PLATELET COUNT: Platelets: 133 K/uL — ABNORMAL LOW (ref 150–400)

## 2024-02-05 LAB — HEMOGLOBIN AND HEMATOCRIT, BLOOD
HCT: 25.8 % — ABNORMAL LOW (ref 39.0–52.0)
Hemoglobin: 8.8 g/dL — ABNORMAL LOW (ref 13.0–17.0)

## 2024-02-05 LAB — ABO/RH: ABO/RH(D): A POS

## 2024-02-05 LAB — APTT: aPTT: 33 s (ref 24–36)

## 2024-02-05 SURGERY — CORONARY ARTERY BYPASS GRAFTING (CABG)
Anesthesia: General | Site: Chest

## 2024-02-05 MED ORDER — PROPOFOL 10 MG/ML IV BOLUS
INTRAVENOUS | Status: DC | PRN
  Administered 2024-02-05: 20 mg via INTRAVENOUS
  Administered 2024-02-05: 40 mg via INTRAVENOUS
  Administered 2024-02-05: 30 mg via INTRAVENOUS

## 2024-02-05 MED ORDER — HEPARIN SODIUM (PORCINE) 1000 UNIT/ML IJ SOLN
INTRAMUSCULAR | Status: AC
Start: 2024-02-05 — End: 2024-02-05
  Filled 2024-02-05: qty 1

## 2024-02-05 MED ORDER — ORAL CARE MOUTH RINSE: 15.0000 mL | OROMUCOSAL | Status: DC | PRN

## 2024-02-05 MED ORDER — FAMOTIDINE IN NACL 20-0.9 MG/50ML-% IV SOLN
20.0000 mg | Freq: Once | INTRAVENOUS | Status: AC
  Administered 2024-02-05: 20 mg via INTRAVENOUS
  Filled 2024-02-05: qty 50

## 2024-02-05 MED ORDER — ASPIRIN 81 MG PO CHEW
324.0000 mg | CHEWABLE_TABLET | Freq: Once | ORAL | Status: AC
  Administered 2024-02-05: 324 mg via ORAL
  Filled 2024-02-05: qty 4

## 2024-02-05 MED ORDER — LIDOCAINE 2% (20 MG/ML) 5 ML SYRINGE
INTRAMUSCULAR | Status: AC
  Filled 2024-02-05: qty 5

## 2024-02-05 MED ORDER — PROPOFOL 10 MG/ML IV BOLUS
INTRAVENOUS | Status: AC
Start: 2024-02-05 — End: 2024-02-05
  Filled 2024-02-05: qty 20

## 2024-02-05 MED ORDER — CHLORHEXIDINE GLUCONATE 0.12 % MT SOLN
15.0000 mL | Freq: Once | OROMUCOSAL | Status: AC
  Administered 2024-02-05: 15 mL via OROMUCOSAL
  Filled 2024-02-05: qty 15

## 2024-02-05 MED ORDER — ASPIRIN 81 MG PO CHEW: 324.0000 mg | CHEWABLE_TABLET | Freq: Every day | ORAL | Status: DC

## 2024-02-05 MED ORDER — EPINEPHRINE 1 MG/10ML IV SOSY
PREFILLED_SYRINGE | INTRAVENOUS | Status: DC | PRN
  Administered 2024-02-05: 10 ug via INTRAVENOUS

## 2024-02-05 MED ORDER — SODIUM BICARBONATE 8.4 % IV SOLN
50.0000 meq | Freq: Once | INTRAVENOUS | Status: AC
  Administered 2024-02-05: 50 meq via INTRAVENOUS

## 2024-02-05 MED ORDER — OXYCODONE HCL 5 MG PO TABS
5.0000 mg | ORAL_TABLET | ORAL | Status: DC | PRN
  Administered 2024-02-05 – 2024-02-06 (×3): 10 mg via ORAL
  Administered 2024-02-07: 5 mg via ORAL
  Filled 2024-02-05 (×3): qty 2
  Filled 2024-02-05: qty 1

## 2024-02-05 MED ORDER — EPINEPHRINE 1 MG/10ML IV SOSY
PREFILLED_SYRINGE | INTRAVENOUS | Status: AC
  Filled 2024-02-05: qty 10

## 2024-02-05 MED ORDER — EPINEPHRINE HCL 5 MG/250ML IV SOLN IN NS: 0.5000 ug/min | INTRAVENOUS | Status: DC

## 2024-02-05 MED ORDER — ~~LOC~~ CARDIAC SURGERY, PATIENT & FAMILY EDUCATION
Freq: Once | Status: DC
  Filled 2024-02-05: qty 1

## 2024-02-05 MED ORDER — DIPHENHYDRAMINE HCL 50 MG/ML IJ SOLN
INTRAMUSCULAR | Status: AC
Start: 2024-02-05 — End: 2024-02-05
  Filled 2024-02-05: qty 1

## 2024-02-05 MED ORDER — ONDANSETRON HCL 4 MG/2ML IJ SOLN: 4.0000 mg | Freq: Four times a day (QID) | INTRAMUSCULAR | Status: DC | PRN

## 2024-02-05 MED ORDER — LACTATED RINGERS IV SOLN: INTRAVENOUS | Status: DC | PRN

## 2024-02-05 MED ORDER — THROMBIN (RECOMBINANT) 20000 UNITS EX SOLR
CUTANEOUS | Status: AC
  Filled 2024-02-05: qty 20000

## 2024-02-05 MED ORDER — FENTANYL CITRATE (PF) 250 MCG/5ML IJ SOLN
INTRAMUSCULAR | Status: AC
  Filled 2024-02-05: qty 5

## 2024-02-05 MED ORDER — ROCURONIUM BROMIDE 10 MG/ML (PF) SYRINGE
PREFILLED_SYRINGE | INTRAVENOUS | Status: AC
Start: 2024-02-05 — End: 2024-02-05
  Filled 2024-02-05: qty 10

## 2024-02-05 MED ORDER — DEXMEDETOMIDINE HCL IN NACL 400 MCG/100ML IV SOLN: 0.0000 ug/kg/h | INTRAVENOUS | Status: DC

## 2024-02-05 MED ORDER — METOPROLOL TARTRATE 5 MG/5ML IV SOLN: 2.5000 mg | INTRAVENOUS | Status: DC | PRN

## 2024-02-05 MED ORDER — SODIUM CHLORIDE (PF) 0.9 % IJ SOLN
INTRAMUSCULAR | Status: AC
  Filled 2024-02-05: qty 20

## 2024-02-05 MED ORDER — SODIUM CHLORIDE 0.9 % IV SOLN: INTRAVENOUS | Status: AC

## 2024-02-05 MED ORDER — NICARDIPINE HCL IN NACL 20-0.86 MG/200ML-% IV SOLN
1.0000 mg/h | INTRAVENOUS | Status: DC
  Administered 2024-02-05: 1 mg/h via INTRAVENOUS
  Filled 2024-02-05: qty 200

## 2024-02-05 MED ORDER — ACETAMINOPHEN 500 MG PO TABS
1000.0000 mg | ORAL_TABLET | Freq: Four times a day (QID) | ORAL | Status: DC
  Administered 2024-02-05 – 2024-02-10 (×17): 1000 mg via ORAL
  Filled 2024-02-05 (×17): qty 2

## 2024-02-05 MED ORDER — DOCUSATE SODIUM 100 MG PO CAPS
200.0000 mg | ORAL_CAPSULE | Freq: Every day | ORAL | Status: DC
  Administered 2024-02-06 – 2024-02-08 (×3): 200 mg via ORAL
  Filled 2024-02-05 (×4): qty 2

## 2024-02-05 MED ORDER — PROTAMINE SULFATE 10 MG/ML IV SOLN
INTRAVENOUS | Status: AC
  Filled 2024-02-05: qty 5

## 2024-02-05 MED ORDER — FENTANYL CITRATE (PF) 250 MCG/5ML IJ SOLN
INTRAMUSCULAR | Status: DC | PRN
  Administered 2024-02-05: 100 ug via INTRAVENOUS
  Administered 2024-02-05: 50 ug via INTRAVENOUS
  Administered 2024-02-05: 100 ug via INTRAVENOUS
  Administered 2024-02-05: 50 ug via INTRAVENOUS
  Administered 2024-02-05 (×2): 100 ug via INTRAVENOUS
  Administered 2024-02-05: 200 ug via INTRAVENOUS

## 2024-02-05 MED ORDER — CALCIUM GLUCONATE-NACL 2-0.675 GM/100ML-% IV SOLN
2.0000 g | Freq: Once | INTRAVENOUS | Status: AC
  Administered 2024-02-05: 2000 mg via INTRAVENOUS
  Filled 2024-02-05: qty 100

## 2024-02-05 MED ORDER — PAROXETINE HCL 20 MG PO TABS
40.0000 mg | ORAL_TABLET | Freq: Every day | ORAL | Status: DC
  Administered 2024-02-05 – 2024-02-10 (×6): 40 mg via ORAL
  Filled 2024-02-05 (×6): qty 2

## 2024-02-05 MED ORDER — PHENYLEPHRINE 80 MCG/ML (10ML) SYRINGE FOR IV PUSH (FOR BLOOD PRESSURE SUPPORT)
PREFILLED_SYRINGE | INTRAVENOUS | Status: AC
  Filled 2024-02-05: qty 10

## 2024-02-05 MED ORDER — CHLORHEXIDINE GLUCONATE 0.12 % MT SOLN
15.0000 mL | OROMUCOSAL | Status: AC
  Administered 2024-02-05: 15 mL via OROMUCOSAL
  Filled 2024-02-05: qty 15

## 2024-02-05 MED ORDER — SODIUM CHLORIDE 0.9% FLUSH
3.0000 mL | Freq: Two times a day (BID) | INTRAVENOUS | Status: DC
  Administered 2024-02-06 – 2024-02-09 (×6): 3 mL via INTRAVENOUS

## 2024-02-05 MED ORDER — PROTAMINE SULFATE 10 MG/ML IV SOLN
INTRAVENOUS | Status: AC
  Filled 2024-02-05: qty 25

## 2024-02-05 MED ORDER — PROPOFOL 500 MG/50ML IV EMUL
INTRAVENOUS | Status: DC | PRN
  Administered 2024-02-05: 35 ug/kg/min via INTRAVENOUS

## 2024-02-05 MED ORDER — 0.9 % SODIUM CHLORIDE (POUR BTL) OPTIME
TOPICAL | Status: DC | PRN
  Administered 2024-02-05: 1000 mL
  Administered 2024-02-05: 5000 mL

## 2024-02-05 MED ORDER — ROCURONIUM BROMIDE 10 MG/ML (PF) SYRINGE
PREFILLED_SYRINGE | INTRAVENOUS | Status: AC
  Filled 2024-02-05: qty 10

## 2024-02-05 MED ORDER — POTASSIUM CHLORIDE CRYS ER 20 MEQ PO TBCR
20.0000 meq | EXTENDED_RELEASE_TABLET | ORAL | Status: AC
  Administered 2024-02-06 (×3): 20 meq via ORAL
  Filled 2024-02-05 (×3): qty 1

## 2024-02-05 MED ORDER — POTASSIUM CHLORIDE 10 MEQ/50ML IV SOLN
10.0000 meq | INTRAVENOUS | Status: AC
  Administered 2024-02-05 (×2): 10 meq via INTRAVENOUS

## 2024-02-05 MED ORDER — PHENYLEPHRINE HCL-NACL 20-0.9 MG/250ML-% IV SOLN: 0.0000 ug/min | INTRAVENOUS | Status: DC

## 2024-02-05 MED ORDER — LACTATED RINGERS IV SOLN: INTRAVENOUS | Status: AC

## 2024-02-05 MED ORDER — METOPROLOL TARTRATE 12.5 MG HALF TABLET
12.5000 mg | ORAL_TABLET | Freq: Two times a day (BID) | ORAL | Status: AC
Start: 2024-02-05 — End: ?
  Administered 2024-02-06: 12.5 mg via ORAL
  Filled 2024-02-05: qty 1

## 2024-02-05 MED ORDER — DIPHENHYDRAMINE HCL 50 MG/ML IJ SOLN
INTRAMUSCULAR | Status: DC | PRN
  Administered 2024-02-05: 50 mg via INTRAVENOUS

## 2024-02-05 MED ORDER — SODIUM CHLORIDE 0.9 % IV SOLN
250.0000 mL | INTRAVENOUS | Status: AC
  Administered 2024-02-06: 250 mL via INTRAVENOUS

## 2024-02-05 MED ORDER — PANTOPRAZOLE SODIUM 40 MG IV SOLR
40.0000 mg | Freq: Every day | INTRAVENOUS | Status: AC
  Administered 2024-02-05 – 2024-02-06 (×2): 40 mg via INTRAVENOUS
  Filled 2024-02-05 (×2): qty 10

## 2024-02-05 MED ORDER — SODIUM CHLORIDE 0.45 % IV SOLN: INTRAVENOUS | Status: AC | PRN

## 2024-02-05 MED ORDER — MORPHINE SULFATE (PF) 2 MG/ML IV SOLN
1.0000 mg | INTRAVENOUS | Status: DC | PRN
  Administered 2024-02-05 – 2024-02-06 (×6): 2 mg via INTRAVENOUS
  Administered 2024-02-07: 1 mg via INTRAVENOUS
  Filled 2024-02-05 (×7): qty 1

## 2024-02-05 MED ORDER — MAGNESIUM SULFATE 4 GM/100ML IV SOLN
4.0000 g | Freq: Once | INTRAVENOUS | Status: AC
  Administered 2024-02-05: 4 g via INTRAVENOUS
  Filled 2024-02-05: qty 100

## 2024-02-05 MED ORDER — LACTATED RINGERS IV SOLN
INTRAVENOUS | Status: DC
Start: 2024-02-05 — End: 2024-02-05

## 2024-02-05 MED ORDER — BISACODYL 10 MG RE SUPP: 10.0000 mg | Freq: Every day | RECTAL | Status: DC

## 2024-02-05 MED ORDER — ALBUMIN HUMAN 5 % IV SOLN
250.0000 mL | INTRAVENOUS | Status: DC | PRN
  Administered 2024-02-05 – 2024-02-06 (×4): 12.5 g via INTRAVENOUS
  Filled 2024-02-05 (×2): qty 250

## 2024-02-05 MED ORDER — SODIUM CHLORIDE 0.9% FLUSH: 3.0000 mL | INTRAVENOUS | Status: DC | PRN

## 2024-02-05 MED ORDER — VANCOMYCIN HCL IN DEXTROSE 1-5 GM/200ML-% IV SOLN
1000.0000 mg | Freq: Once | INTRAVENOUS | Status: AC
  Administered 2024-02-05: 1000 mg via INTRAVENOUS
  Filled 2024-02-05: qty 200

## 2024-02-05 MED ORDER — CHLORHEXIDINE GLUCONATE CLOTH 2 % EX PADS
6.0000 | MEDICATED_PAD | Freq: Every day | CUTANEOUS | Status: DC
  Administered 2024-02-05 – 2024-02-08 (×4): 6 via TOPICAL

## 2024-02-05 MED ORDER — BISACODYL 5 MG PO TBEC
10.0000 mg | DELAYED_RELEASE_TABLET | Freq: Every day | ORAL | Status: DC
  Administered 2024-02-06 – 2024-02-10 (×3): 10 mg via ORAL
  Filled 2024-02-05 (×3): qty 2

## 2024-02-05 MED ORDER — EPHEDRINE SULFATE-NACL 50-0.9 MG/10ML-% IV SOSY
PREFILLED_SYRINGE | INTRAVENOUS | Status: DC | PRN
  Administered 2024-02-05: 5 mg via INTRAVENOUS
  Administered 2024-02-05 (×2): 2.5 mg via INTRAVENOUS

## 2024-02-05 MED ORDER — HEPARIN SODIUM (PORCINE) 1000 UNIT/ML IJ SOLN
INTRAMUSCULAR | Status: DC | PRN
  Administered 2024-02-05: 29000 [IU] via INTRAVENOUS

## 2024-02-05 MED ORDER — CALCIUM CHLORIDE 10 % IV SOLN
INTRAVENOUS | Status: AC
  Filled 2024-02-05: qty 10

## 2024-02-05 MED ORDER — ACETAMINOPHEN 160 MG/5ML PO SOLN
650.0000 mg | Freq: Once | ORAL | Status: AC
  Administered 2024-02-05: 650 mg
  Filled 2024-02-05: qty 20.3

## 2024-02-05 MED ORDER — METOPROLOL TARTRATE 12.5 MG HALF TABLET: 12.5000 mg | ORAL_TABLET | Freq: Once | ORAL | Status: DC

## 2024-02-05 MED ORDER — ASPIRIN 325 MG PO TBEC
325.0000 mg | DELAYED_RELEASE_TABLET | Freq: Every day | ORAL | Status: DC
  Administered 2024-02-06 – 2024-02-10 (×5): 325 mg via ORAL
  Filled 2024-02-05 (×6): qty 1

## 2024-02-05 MED ORDER — MIDAZOLAM HCL 2 MG/2ML IJ SOLN: 2.0000 mg | INTRAMUSCULAR | Status: DC | PRN

## 2024-02-05 MED ORDER — CHLORHEXIDINE GLUCONATE 4 % EX SOLN: 30.0000 mL | CUTANEOUS | Status: DC

## 2024-02-05 MED ORDER — DEXTROSE 50 % IV SOLN: 0.0000 mL | INTRAVENOUS | Status: DC | PRN

## 2024-02-05 MED ORDER — MIDAZOLAM HCL (PF) 5 MG/ML IJ SOLN
INTRAMUSCULAR | Status: DC | PRN
  Administered 2024-02-05: 2 mg via INTRAVENOUS
  Administered 2024-02-05: 3 mg via INTRAVENOUS
  Administered 2024-02-05 (×2): 1 mg via INTRAVENOUS
  Administered 2024-02-05: 2 mg via INTRAVENOUS
  Administered 2024-02-05: 1 mg via INTRAVENOUS

## 2024-02-05 MED ORDER — VASOPRESSIN 20 UNIT/ML IV SOLN
INTRAVENOUS | Status: DC | PRN
  Administered 2024-02-05 (×2): 1 [IU] via INTRAVENOUS

## 2024-02-05 MED ORDER — METOCLOPRAMIDE HCL 5 MG/ML IJ SOLN
10.0000 mg | Freq: Four times a day (QID) | INTRAMUSCULAR | Status: AC
  Administered 2024-02-05 – 2024-02-07 (×6): 10 mg via INTRAVENOUS
  Filled 2024-02-05 (×6): qty 2

## 2024-02-05 MED ORDER — NOREPINEPHRINE 4 MG/250ML-% IV SOLN
0.0000 ug/min | INTRAVENOUS | Status: DC
  Administered 2024-02-06: 11 ug/min via INTRAVENOUS
  Filled 2024-02-05: qty 250

## 2024-02-05 MED ORDER — ATORVASTATIN CALCIUM 80 MG PO TABS
80.0000 mg | ORAL_TABLET | Freq: Every day | ORAL | Status: DC
  Administered 2024-02-05 – 2024-02-10 (×6): 80 mg via ORAL
  Filled 2024-02-05 (×6): qty 1

## 2024-02-05 MED ORDER — CHLORHEXIDINE GLUCONATE 0.12 % MT SOLN: 15.0000 mL | Freq: Once | OROMUCOSAL | Status: DC

## 2024-02-05 MED ORDER — ACETAMINOPHEN 160 MG/5ML PO SOLN: 1000.0000 mg | Freq: Four times a day (QID) | ORAL | Status: DC

## 2024-02-05 MED ORDER — ALBUMIN HUMAN 5 % IV SOLN: INTRAVENOUS | Status: DC | PRN

## 2024-02-05 MED ORDER — PROTAMINE SULFATE 10 MG/ML IV SOLN
INTRAVENOUS | Status: DC | PRN
  Administered 2024-02-05 (×3): 20 mg via INTRAVENOUS
  Administered 2024-02-05 (×3): 30 mg via INTRAVENOUS
  Administered 2024-02-05 (×5): 20 mg via INTRAVENOUS

## 2024-02-05 MED ORDER — PLASMA-LYTE A IV SOLN
INTRAVENOUS | Status: DC | PRN
  Administered 2024-02-05: 500 mL via INTRAVASCULAR

## 2024-02-05 MED ORDER — CEFAZOLIN SODIUM-DEXTROSE 2-4 GM/100ML-% IV SOLN
2.0000 g | Freq: Three times a day (TID) | INTRAVENOUS | Status: AC
  Administered 2024-02-05 – 2024-02-07 (×6): 2 g via INTRAVENOUS
  Filled 2024-02-05 (×6): qty 100

## 2024-02-05 MED ORDER — TERAZOSIN HCL 1 MG PO CAPS
2.0000 mg | ORAL_CAPSULE | Freq: Every day | ORAL | Status: DC
  Administered 2024-02-05 – 2024-02-09 (×5): 2 mg via ORAL
  Filled 2024-02-05 (×8): qty 2

## 2024-02-05 MED ORDER — CALCIUM CHLORIDE 10 % IV SOLN
INTRAVENOUS | Status: DC | PRN
  Administered 2024-02-05 (×4): 250 mg via INTRAVENOUS

## 2024-02-05 MED ORDER — SUCCINYLCHOLINE CHLORIDE 200 MG/10ML IV SOSY
PREFILLED_SYRINGE | INTRAVENOUS | Status: DC | PRN
  Administered 2024-02-05: 100 mg via INTRAVENOUS

## 2024-02-05 MED ORDER — EPHEDRINE 5 MG/ML INJ
INTRAVENOUS | Status: AC
  Filled 2024-02-05: qty 5

## 2024-02-05 MED ORDER — ROCURONIUM BROMIDE 10 MG/ML (PF) SYRINGE
PREFILLED_SYRINGE | INTRAVENOUS | Status: DC | PRN
  Administered 2024-02-05: 100 mg via INTRAVENOUS
  Administered 2024-02-05: 50 mg via INTRAVENOUS
  Administered 2024-02-05 (×2): 30 mg via INTRAVENOUS
  Administered 2024-02-05: 50 mg via INTRAVENOUS

## 2024-02-05 MED ORDER — PHENYLEPHRINE 80 MCG/ML (10ML) SYRINGE FOR IV PUSH (FOR BLOOD PRESSURE SUPPORT)
PREFILLED_SYRINGE | INTRAVENOUS | Status: DC | PRN
  Administered 2024-02-05: 40 ug via INTRAVENOUS
  Administered 2024-02-05 (×4): 80 ug via INTRAVENOUS
  Administered 2024-02-05: 40 ug via INTRAVENOUS

## 2024-02-05 MED ORDER — ORAL CARE MOUTH RINSE: 15.0000 mL | Freq: Once | OROMUCOSAL | Status: AC

## 2024-02-05 MED ORDER — HEMOSTATIC AGENTS (NO CHARGE) OPTIME
TOPICAL | Status: DC | PRN
  Administered 2024-02-05: 1 via TOPICAL

## 2024-02-05 MED ORDER — LIDOCAINE HCL (CARDIAC) PF 100 MG/5ML IV SOSY
PREFILLED_SYRINGE | INTRAVENOUS | Status: DC | PRN
  Administered 2024-02-05: 60 mg via INTRATRACHEAL

## 2024-02-05 MED ORDER — MIDAZOLAM HCL (PF) 10 MG/2ML IJ SOLN
INTRAMUSCULAR | Status: AC
Start: 2024-02-05 — End: 2024-02-05
  Filled 2024-02-05: qty 2

## 2024-02-05 MED ORDER — TRAMADOL HCL 50 MG PO TABS
50.0000 mg | ORAL_TABLET | ORAL | Status: DC | PRN
  Administered 2024-02-06: 100 mg via ORAL
  Administered 2024-02-06: 50 mg via ORAL
  Administered 2024-02-06 – 2024-02-08 (×5): 100 mg via ORAL
  Administered 2024-02-08: 50 mg via ORAL
  Filled 2024-02-05 (×3): qty 2
  Filled 2024-02-05: qty 1
  Filled 2024-02-05: qty 2
  Filled 2024-02-05: qty 1
  Filled 2024-02-05 (×2): qty 2

## 2024-02-05 MED ORDER — THROMBIN 20000 UNITS EX SOLR
CUTANEOUS | Status: DC | PRN
  Administered 2024-02-05 (×3): 4 mL via TOPICAL

## 2024-02-05 MED ORDER — VASOPRESSIN 20 UNIT/ML IV SOLN
INTRAVENOUS | Status: AC
  Filled 2024-02-05: qty 1

## 2024-02-05 MED ORDER — METOPROLOL TARTRATE 25 MG/10 ML ORAL SUSPENSION: 12.5000 mg | Freq: Two times a day (BID) | ORAL | Status: DC

## 2024-02-05 MED ORDER — PROPOFOL 1000 MG/100ML IV EMUL: 0.0000 ug/kg/min | INTRAVENOUS | Status: DC

## 2024-02-05 MED ORDER — INSULIN REGULAR(HUMAN) IN NACL 100-0.9 UT/100ML-% IV SOLN: INTRAVENOUS | Status: AC

## 2024-02-05 MED ORDER — SUCCINYLCHOLINE CHLORIDE 200 MG/10ML IV SOSY
PREFILLED_SYRINGE | INTRAVENOUS | Status: AC
  Filled 2024-02-05: qty 10

## 2024-02-05 MED ORDER — PANTOPRAZOLE SODIUM 40 MG PO TBEC
40.0000 mg | DELAYED_RELEASE_TABLET | Freq: Every day | ORAL | Status: DC
  Administered 2024-02-07 – 2024-02-10 (×4): 40 mg via ORAL
  Filled 2024-02-05 (×4): qty 1

## 2024-02-05 SURGICAL SUPPLY — 92 items
ADAPTER CARDIO PERF ANTE/RETRO (ADAPTER) IMPLANT
ADAPTER MULTI PERFUSION 15 (ADAPTER) ×4 IMPLANT
BAG DECANTER FOR FLEXI CONT (MISCELLANEOUS) ×4 IMPLANT
BLADE CLIPPER SURG (BLADE) ×4 IMPLANT
BLADE STERNUM SYSTEM 6 (BLADE) ×4 IMPLANT
BLADE SURG 15 STRL SS SAFETY (BLADE) IMPLANT
BNDG ELASTIC 4INX 5YD STR LF (GAUZE/BANDAGES/DRESSINGS) IMPLANT
BNDG ELASTIC 4X5.8 VLCR STR LF (GAUZE/BANDAGES/DRESSINGS) ×4 IMPLANT
BNDG ELASTIC 6INX 5YD STR LF (GAUZE/BANDAGES/DRESSINGS) ×4 IMPLANT
BNDG GAUZE DERMACEA FLUFF 4 (GAUZE/BANDAGES/DRESSINGS) ×4 IMPLANT
CANISTER SUCTION 3000ML PPV (SUCTIONS) ×4 IMPLANT
CANNULA AORTIC ROOT 9FR (CANNULA) ×4 IMPLANT
CANNULA GUNDRY RCSP 15FR (MISCELLANEOUS) IMPLANT
CANNULA MC2 2 STG 36/46 CONN (CANNULA) IMPLANT
CANNULA MC2 2 STG 36/46 NON-V (CANNULA) IMPLANT
CANNULA NON VENT 20FR 12 (CANNULA) IMPLANT
CANNULA VESSEL 3MM BLUNT TIP (CANNULA) ×4 IMPLANT
CATH ROBINSON RED A/P 18FR (CATHETERS) ×8 IMPLANT
CATH THOR RT ANG 28F 9128 SOFT (CATHETERS) ×4 IMPLANT
CATH THORACIC 28FR (CATHETERS) ×4 IMPLANT
CATH THORACIC 36FR (CATHETERS) ×4 IMPLANT
CLIP TI WIDE RED SMALL 24 (CLIP) IMPLANT
CONTAINER PROTECT SURGISLUSH (MISCELLANEOUS) ×8 IMPLANT
COUNTER NDL 20CT MAGNET RED (NEEDLE) IMPLANT
COVER MAYO STAND STRL (DRAPES) IMPLANT
DERMABOND ADVANCED .7 DNX12 (GAUZE/BANDAGES/DRESSINGS) IMPLANT
DRAPE SRG 135X102X78XABS (DRAPES) ×4 IMPLANT
DRAPE WARM FLUID 44X44 (DRAPES) ×4 IMPLANT
DRSG AQUACEL AG ADV 3.5X10 (GAUZE/BANDAGES/DRESSINGS) IMPLANT
DRSG COVADERM 4X14 (GAUZE/BANDAGES/DRESSINGS) ×4 IMPLANT
ELECTRODE REM PT RTRN 9FT ADLT (ELECTROSURGICAL) ×8 IMPLANT
FELT TEFLON 1X6 (MISCELLANEOUS) ×4 IMPLANT
GAUZE SPONGE 4X4 12PLY STRL (GAUZE/BANDAGES/DRESSINGS) ×8 IMPLANT
GLOVE BIO SURGEON STRL SZ 6.5 (GLOVE) IMPLANT
GLOVE BIOGEL PI IND STRL 7.0 (GLOVE) ×8 IMPLANT
GLOVE BIOGEL PI MICRO STRL 7 (GLOVE) ×12 IMPLANT
GLOVE SS BIOGEL STRL SZ 6 (GLOVE) IMPLANT
GOWN STRL REUS W/ TWL LRG LVL3 (GOWN DISPOSABLE) ×20 IMPLANT
GOWN STRL REUS W/ TWL XL LVL3 (GOWN DISPOSABLE) ×8 IMPLANT
HEMOSTAT POWDER SURGIFOAM 1G (HEMOSTASIS) ×12 IMPLANT
HEMOSTAT SURGICEL 2X14 (HEMOSTASIS) ×4 IMPLANT
KIT BASIN OR (CUSTOM PROCEDURE TRAY) ×4 IMPLANT
KIT SUCTION CATH 14FR (SUCTIONS) ×4 IMPLANT
KIT TURNOVER KIT B (KITS) ×4 IMPLANT
KIT VASOVIEW HEMOPRO 2 VH 4000 (KITS) ×4 IMPLANT
MARKER GRAFT CORONARY BYPASS (MISCELLANEOUS) ×12 IMPLANT
PACK E OPEN HEART (SUTURE) ×4 IMPLANT
PACK OPEN HEART (CUSTOM PROCEDURE TRAY) ×4 IMPLANT
PAD ARMBOARD POSITIONER FOAM (MISCELLANEOUS) ×8 IMPLANT
PAD ELECT DEFIB RADIOL ZOLL (MISCELLANEOUS) ×4 IMPLANT
PENCIL BUTTON HOLSTER BLD 10FT (ELECTRODE) ×4 IMPLANT
POSITIONER HEAD DONUT 9IN (MISCELLANEOUS) ×4 IMPLANT
PUNCH AORTIC ROTATE 4.0MM (MISCELLANEOUS) IMPLANT
SET MICROPUNCTURE 5F STIFF (MISCELLANEOUS) IMPLANT
SET MPS 3-ND DEL (MISCELLANEOUS) IMPLANT
SHEARS HARMONIC 9CM CVD (BLADE) IMPLANT
SHEATH ULTRASOUND LF (SHEATH) IMPLANT
SOLN 0.9% NACL 1000 ML (IV SOLUTION) ×15 IMPLANT
SOLN 0.9% NACL POUR BTL 1000ML (IV SOLUTION) ×20 IMPLANT
SOLN STERILE WATER 1000 ML (IV SOLUTION) ×6 IMPLANT
SOLN STERILE WATER BTL 1000 ML (IV SOLUTION) ×8 IMPLANT
SOLUTION ANTFG W/FOAM PAD STRL (MISCELLANEOUS) IMPLANT
SPONGE T-LAP 18X18 ~~LOC~~+RFID (SPONGE) IMPLANT
STOPCOCK 4 WAY LG BORE MALE ST (IV SETS) IMPLANT
SUPPORT HEART JANKE-BARRON (MISCELLANEOUS) ×4 IMPLANT
SUT BONE WAX W31G (SUTURE) ×4 IMPLANT
SUT ETHIBOND X763 2 0 SH 1 (SUTURE) ×8 IMPLANT
SUT MNCRL AB 4-0 PS2 18 (SUTURE) IMPLANT
SUT PROLENE 4 0 SH DA (SUTURE) ×4 IMPLANT
SUT PROLENE 4-0 RB1 .5 CRCL 36 (SUTURE) IMPLANT
SUT PROLENE 6 0 C 1 30 (SUTURE) ×8 IMPLANT
SUT PROLENE 7 0 BV 1 (SUTURE) IMPLANT
SUT PROLENE 7 0 BV1 MDA (SUTURE) ×4 IMPLANT
SUT SILK 2 0SH CR/8 30 (SUTURE) ×4 IMPLANT
SUT STEEL 6MS V (SUTURE) ×4 IMPLANT
SUT STEEL STERNAL CCS#1 18IN (SUTURE) IMPLANT
SUT STEEL SZ 6 DBL 3X14 BALL (SUTURE) ×8 IMPLANT
SUT VIC AB 1 CTX36XBRD ANBCTR (SUTURE) ×8 IMPLANT
SUT VIC AB 2-0 CT1 TAPERPNT 27 (SUTURE) IMPLANT
SUT VIC AB 2-0 CTX 27 (SUTURE) IMPLANT
SYSTEM SAHARA CHEST DRAIN ATS (WOUND CARE) ×4 IMPLANT
TAPE CLOTH 4X10 WHT NS (GAUZE/BANDAGES/DRESSINGS) IMPLANT
TAPE PAPER 2X10 WHT MICROPORE (GAUZE/BANDAGES/DRESSINGS) IMPLANT
TOWEL GREEN STERILE (TOWEL DISPOSABLE) ×4 IMPLANT
TOWEL GREEN STERILE FF (TOWEL DISPOSABLE) ×4 IMPLANT
TRAY CATH LUMEN 1 20CM STRL (SET/KITS/TRAYS/PACK) IMPLANT
TRAY FOLEY SLVR 16FR TEMP STAT (SET/KITS/TRAYS/PACK) ×4 IMPLANT
TUBE SUCT INTRACARD DLP 20F (MISCELLANEOUS) ×4 IMPLANT
TUBE SUCTION CARDIAC 10FR (CANNULA) ×4 IMPLANT
TUBING ART PRESS 48 MALE/FEM (TUBING) IMPLANT
TUBING LAP HI FLOW INSUFFLATIO (TUBING) ×4 IMPLANT
UNDERPAD 30X36 HEAVY ABSORB (UNDERPADS AND DIAPERS) ×4 IMPLANT

## 2024-02-05 NOTE — Hospital Course (Addendum)
 HPI: This is a 63 y.o. male who presents for surgical evaluation of multivessel coronary artery disease.  He was 63 years old when he had his first MI - at that time he had neck burning then head burning that then radiated down his left arm.  He had another heart attack at 31 and was doing well but for the last 3 months has been having more pain.  He is taking Nitroglycerin  1-3 times a day and tried taking Imdur  but had severe headaches (even when taking at night) so he has subsequently stopped.   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  When the weather was very hot, he would have a lot more pain as it was exacerbated by heat.  He feels better recently now that the weather has cooled down.  He is still working every day.  He is right hand dominant.   His blood pressure is moderately well controlled, it was high today but he says it's usually SBP 150s at home.  He denies syncope, palpitations, leg swelling, orthopnea or dizziness.   He is a former 60 pack year smoker, and quit in 1998. Dr. Daniel discussed the need for coronary artery bypass grafting surgery. Potential risks, benefits, and complications of the surgery were discussed with the patient and he agreed to proceed with surgery. Pre operative carotid duplex US  showed no significant internal carotid artery stenosis bilaterally.  Hospital Course: Patient underwent a CABG x 3 utilizing LIMA to LAD, SVG to PDA, and Radial artery to OM as well as open radial harvest of the left arm and endoscopic vein harvest of the right thigh. He was transported from the OR to [facility] ICU in stable condition. He was extubated later the evening of surgery. He was weaned off Epinephrine, Levophed and Cardene drips (for radial artery conduit). He was then started on low dose Lopressor and low dose Amlodipine (for radial artery conduit).  Pacing wires were capped on POD 1. Chest tubes remained for a few days and once output decreased were removed. Foley were removed on  POD 2. He was above his pre op weight and was diuresed. He was transitioned off the Insulin  drip. His pre op HGA1C was 8. He will need close medical follow up after discharge. He had expected post op blood loss anemia and did not require a transfusion. He also had mild thrombocytopenia which resolved prior to discharge. He had a fair amount of incisional/sternal pain post op. This was relieved with Oxy Q 3 PRN, Gabapentin, Lidocaine  patch, and Robaxin Q 8 PRN. EPW were removed on POD 2.

## 2024-02-05 NOTE — Progress Notes (Signed)
 Patient ID: Justin Galvan, male   DOB: 06/30/1960, 63 y.o.   MRN: [id number]   [facility] Evening Rounds:   Hemodynamically stable, Cleviprex for radial graft. CI = 2.9  Still asleep on vent  Urine output good  CT output low  CBC    Component Value Date/Time   WBC 13.5 (H) 02/05/2024 1607   RBC 4.21 (L) 02/05/2024 1607   HGB 12.4 (L) 02/05/2024 1607   HGB 15.5 01/14/2024 1039   HCT 35.7 (L) 02/05/2024 1607   HCT 47.3 01/14/2024 1039   PLT 172 02/05/2024 1607   PLT 218 01/14/2024 1039   MCV 84.8 02/05/2024 1607   MCV 90 01/14/2024 1039   MCH 29.5 02/05/2024 1607   MCHC 34.7 02/05/2024 1607   RDW 12.9 02/05/2024 1607   RDW 13.2 01/14/2024 1039   LYMPHSABS 1.7 09/23/2023 1124   MONOABS 0.6 09/23/2023 1124   EOSABS 0.2 09/23/2023 1124   BASOSABS 0.1 09/23/2023 1124     BMET    Component Value Date/Time   NA 138 02/05/2024 1714   NA 141 01/14/2024 1039   K 3.1 (L) 02/05/2024 1714   CL 112 (H) 02/05/2024 1714   CO2 19 (L) 02/05/2024 1714   GLUCOSE 131 (H) 02/05/2024 1714   BUN 15 02/05/2024 1714   BUN 21 01/14/2024 1039   CREATININE 0.68 02/05/2024 1714   CREATININE 0.91 11/25/2019 0835   CALCIUM  6.7 (L) 02/05/2024 1714   EGFR 92 01/14/2024 1039   GFRNONAA >60 02/05/2024 1714   GFRNONAA 91 11/25/2019 0835     A/P:  Stable postop course. Continue current plans. Wean off Precedex and vent.

## 2024-02-05 NOTE — Plan of Care (Signed)

## 2024-02-05 NOTE — Consult Note (Signed)
 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   Appears he recently re-established with cardiology in September 2025. Reportedly having exertional angina worsening since April. He was scheduled for cardiac cath which was completed on 01/21/24 showing stenosis: diag 90%, prox RCA 90, mid RCA 100, prox cx 99%, mid LM to distal LM 70%. Echo same day showed EF 45-50%, RWMA, G1DD, trivial MR. He was referred to [facility] who planned for CABG.   Pump time: 2h 49m Xclamp time: 1h 92m EBL: 990 Products: none Cell saver: 510  Pertinent  Medical History  arthritis, depression, hypertension, hyperlipidemia, type 2 diabetes, CAD, MI x2 s/p 3 RCA stents now occluded, LAD, PCI, HFmrEF  Significant Hospital Events: Including procedures, antibiotic start and stop dates in addition to other pertinent events   10/15: admit to CCU s/p CABG x 3; protamine reaction requiring initiation of epinephrine   Interim History / Subjective:  Admit to CCU s/p CABG x3 by Dr. Daniel. Noted to have likely protamine reaction requiring push dose pressors and ultimately initiation of epinephrine drip. Given benadryl. Adding pepcid 20 IV now.   Objective   Blood pressure 121/67, pulse (!) 54, temperature 97.7 F (36.5 C), temperature source Oral, resp. rate 12, height 5' 7 (1.702 m), weight 79.4 kg, SpO2 94%.        Intake/Output Summary (Last 24 hours) at 02/05/2024 1043 Last data filed at 02/05/2024 1020 Gross per 24 hour  Intake 350 ml  Output 275 ml  Net 75 ml   Filed Weights   02/05/24 0707  Weight: 79.4 kg    Examination: General: older male, post op   HENT: anicteric sclera, perrla, ncat, ett, ogt Lungs: vented, synchronous  Cardiovascular: s1s2, rub, paced, warm  Abdomen: rounded, soft, hypoactive Extremities: warm, non edematous, R leg post-op dressing  Neuro: sedated GU: foley   Resolved Hospital Problem list    Assessment & Plan:  Multivessel CAD s/p CABG x 3 LIMA LAD, RSVG PDA, radial artery to OM History of MI x2 with stents to RCA and LAD with known RCA occlusion  Post bypass vasoplegia - post-op management per TCTS - CXR/ ABG, CBC, BMET now - con't weaning pressors as able to maintain MAP >65-85. Albumin boluses prn for low SV.  - tele monitoring/ pacing prn - mediastinal drains per TCTS - once off pressors can use cardene for vasospasm  - multimodal pain control per protocol- oxycodone , tramadol, morphine  with bowel regimen - ASA tomorrow  - metoprolol BID once off pressors - complete post-op antibiotics - monitor electrolytes, replete PRN   Protamine reaction? Given protamine at end of case with skin redness and hypotension requiring multiple push doses of vasopressors and eventually epinephrine gtt initiated with improvement. Given benadryl in OR. - famotidine 20mg  IV now  - hold on solu-medrol  unless becomes hypotensive again (Dr. Daniel okay with single dose 125mg  if needed) - wean levo first for MAP goals then epi  - added to allergy list  Post operative vent management  History of tobacco use, remote, 60 pack-years - rapid wean per TCTS protocol - full mechanical vent support - lung protective ventilation 6-8cc/kg Vt -  VAP and PAD bundle in place  - titrate FiO2 to sat goal >92  - maintain peak/plats <30, driving pressures <84    Expected post-operative ABLA Expected post-operative consumptive thrombocytopenia  - trend  - transfuse prn   Hypertension  Hyperlipidemia  On carvedilol , irbesartan , imdur , indapamide , atorvastatin   - metoprolol BID once of pressors  - on levo, epi post-op, wean  - lipitor   80mg  daily   Diabetes  On metformin , jardiance  and insulin  at home  - endotool  - transition tomorrow if glucoses okay  - cbg per endotool   Depression  - con't home Paxil  40mg  daily   BPH  - con't home terazosin  2mg  daily   Labs   CBC: Recent Labs  Lab 02/03/24 1200 02/05/24 0914 02/05/24 0917  WBC 7.8  --   --   HGB 16.1 11.6* 12.2*  HCT 47.2 34.0* 36.0*  MCV 86.4  --   --   PLT 229  --   --     Basic Metabolic Panel: Recent Labs  Lab 02/03/24 1200 02/05/24 0914 02/05/24 0917  NA 138 139 139  K 4.3 4.1 4.0  CL 100 104  --   CO2 27  --   --   GLUCOSE 171* 167*  --   BUN 19 26*  --   CREATININE 1.02 0.90  --   CALCIUM  9.5  --   --    GFR: Estimated Creatinine Clearance: 82.6 mL/min (by C-G formula based on SCr of 0.9 mg/dL). Recent Labs  Lab 02/03/24 1200  WBC 7.8    Liver Function Tests: Recent Labs  Lab 02/03/24 1200  AST 23  ALT 24  ALKPHOS 67  BILITOT 0.9  PROT 7.2  ALBUMIN 4.5   No results for input(s): LIPASE, AMYLASE in the last 168 hours. No results for input(s): AMMONIA in the last 168 hours.  ABG    Component Value Date/Time   PHART 7.315 (L) 02/05/2024 0917   PCO2ART 52.0 (H) 02/05/2024 0917   PO2ART 300 (H) 02/05/2024 0917   HCO3 26.5 02/05/2024 0917   TCO2 28 02/05/2024 0917   O2SAT 100 02/05/2024 0917     Coagulation Profile: Recent Labs  Lab 02/03/24 1200  INR 0.9    Cardiac Enzymes: No results for input(s): CKTOTAL, CKMB, CKMBINDEX, TROPONINI in the last 168 hours.  HbA1C: Hgb A1c MFr Bld  Date/Time Value Ref Range Status  02/03/2024 12:00 PM 8.0 (H) 4.8 - 5.6 % Final    Comment:    (NOTE) Diagnosis of Diabetes The following HbA1c ranges recommended by the American Diabetes Association (ADA) may be used as an aid in the diagnosis of diabetes mellitus.  Hemoglobin             Suggested A1C NGSP%              Diagnosis  <5.7                   Non Diabetic  5.7-6.4                 Pre-Diabetic  >6.4                   Diabetic  <7.0                   Glycemic control for                       adults with diabetes.  09/23/2023 11:24 AM 8.0 (H) 4.6 - 6.5 % Final    Comment:    Glycemic Control Guidelines for People with Diabetes:Non Diabetic:  <6%Goal of Therapy: <7%Additional Action Suggested:  >8%     CBG: Recent Labs  Lab 02/03/24 1114 02/05/24 0711  GLUCAP 207* 173*    Review of Systems:   As above  Past Medical History:  He,  has a past medical history of Anginal pain, Anxiety, Arthritis, BPH (benign prostatic hypertrophy), CAD, NATIVE VESSEL, Hx. 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   Surgical History:   Past Surgical History:  Procedure Laterality Date   CARDIAC CATHETERIZATION N/A 04/26/2015   Procedure: Left Heart Cath and Coronary Angiography;  Surgeon: Lonni JONETTA Cash, MD;  [facility] INVASIVE CV LAB;  Service: Cardiovascular;  Laterality: N/A;   CORONARY ANGIOPLASTY WITH STENT PLACEMENT  1997   got several stents; ~8 from 1997 to January 2015; 2 stent segments in the RCA, and single proximal-mid LAD stent jailing D1   LEFT HEART CATH AND CORONARY ANGIOGRAPHY N/A 01/21/2024   Procedure: LEFT HEART CATH AND CORONARY ANGIOGRAPHY;  Surgeon: Cash Lonni JONETTA, MD;  Location: [facility] INVASIVE CV LAB;  Service: Cardiovascular;  Laterality: N/A;   LEFT HEART  CATHETERIZATION WITH CORONARY ANGIOGRAM N/A 05/06/2013   Procedure: LEFT HEART CATHETERIZATION WITH CORONARY ANGIOGRAM;  Surgeon: Lonni JONETTA Cash, MD;  Location: [facility] CATH LAB;  Service: CV:;; Two-vessel CAD.  Patent LAD stent.  Mid LAD has segmental 20% stenosis involving 30% ostial D2.  Severe disease in small jailed D1 (90%) - Too small for PCI.  Proximal RCA stent 50% ISR -& CTO of mid RCA W/L-to-R collaterals.  Mild segmental LV dysfxn.     Social History:   reports that he quit smoking about 27 years ago. His smoking use included cigarettes. He started smoking about 49 years ago. He has a 60.5 pack-year smoking history. He has never used smokeless tobacco. He reports current alcohol use. He reports that he does not use drugs.   Family History:  His family history includes Heart disease in his mother; Hypertension in an other family member. There is no history of Cancer, Diabetes, Early death, Hyperlipidemia, Kidney disease, Stroke, or Alcohol abuse.   Allergies Allergies  Allergen Reactions   Semaglutide (0.25 Or 0.5mg -Dos) Nausea And Vomiting     Home Medications  Prior to Admission medications   Medication Sig Start Date End Date Taking? Authorizing Provider  ASPIRIN  LOW DOSE 81 MG tablet TAKE 1 TABLET (81 MG TOTAL) BY MOUTH DAILY. SWALLOW WHOLE. 12/05/23  Yes Joshua Debby CROME, MD  atorvastatin  (LIPITOR ) 80 MG tablet TAKE 1 TABLET (80 MG TOTAL) BY MOUTH DAILY AT 6PM 10/30/23  Yes Joshua Debby CROME, MD  carvedilol  (COREG ) 3.125 MG tablet Take 1 tablet (3.125 mg total) by mouth 2 (two) times daily. 01/14/24  Yes Anner Alm ORN, MD  icosapent  Ethyl (VASCEPA ) 1 g capsule TAKE 2 CAPSULES BY MOUTH 2 TIMES DAILY. Patient taking differently: Take 1 g by mouth daily at 12 noon. TAKE 2 CAPSULES BY MOUTH 2 TIMES  DAILY. 04/25/22  Yes Joshua Debby CROME, MD  indapamide  (LOZOL ) 1.25 MG tablet TAKE 1 TABLET BY MOUTH DAILY. 12/05/23  Yes Joshua Debby CROME, MD  insulin  glargine, 2 Unit Dial, (TOUJEO  MAX  SOLOSTAR) 300 UNIT/ML Solostar Pen INJECT 20 UNITS INTO THE SKIN DAILY Patient taking differently: Inject 20 Units into the skin every morning. 10/08/23  Yes Joshua Debby CROME, MD  irbesartan  (AVAPRO ) 300 MG tablet TAKE 1 TABLET BY MOUTH EVERY DAY 12/05/23  Yes Joshua Debby CROME, MD  isosorbide  mononitrate (IMDUR ) 30 MG 24 hr tablet Take 1 tablet (30 mg total) by mouth daily. 01/14/24  Yes Anner Alm ORN, MD  JARDIANCE  10 MG TABS tablet TAKE 1 TABLET BY MOUTH DAILY BEFORE BREAKFAST. 12/05/23  Yes Joshua Debby CROME, MD  metFORMIN  (GLUCOPHAGE -XR) 750 MG 24 hr tablet Take 1 tablet (750 mg total) by mouth daily with breakfast. 09/23/23  Yes Joshua Debby CROME, MD  nitroGLYCERIN  (NITROSTAT ) 0.4 MG SL tablet Place 1 tablet (0.4 mg total) under the tongue every 5 (five) minutes as needed for chest pain. 09/23/23 02/03/24 Yes Joshua Debby CROME, MD  PARoxetine  (PAXIL ) 40 MG tablet TAKE 1 TABLET BY MOUTH EVERY DAY 12/05/23  Yes Joshua Debby CROME, MD  terazosin  (HYTRIN ) 2 MG capsule TAKE 1 CAPSULE BY MOUTH EVERYDAY AT BEDTIME 12/05/23  Yes Joshua Debby CROME, MD  Continuous Glucose Sensor (FREESTYLE LIBRE 3 PLUS SENSOR) MISC Apply 1 Act topically every 14 (fourteen) days. Change sensor every 15 days. Patient not taking: Reported on 02/03/2024 09/23/23   Joshua Debby CROME, MD  Insulin  Pen Needle 32G X 6 MM MISC 1 Act by Does not apply route daily. 09/23/23   Joshua Debby CROME, MD  tadalafil  (CIALIS ) 20 MG tablet Take 1 tablet (20 mg total) by mouth daily as needed for erectile dysfunction. 04/17/18   Jason Leita Repine, FNP     Critical care time: 38    Tinnie FORBES Adolph DEVONNA [place], [place] [facility] 02/05/24 10:45 AM  Please see [email or web address] for pager details.  From 7A-7P if no response, please call [phone] After hours, please call ELink [phone]

## 2024-02-05 NOTE — Transfer of Care (Signed)
 Immediate Anesthesia Transfer of Care Note  Patient: Justin Galvan  Procedure(s) Performed: CORONARY ARTERY BYPASS GRAFTING (CABG) X THREE USING LEFT INTERNAL MAMMARY ARTERY, RIGHT LEG GREATER SAPHENOUS VEIN, LEFT RADIAL ARTERY (Chest) SURGICAL PROCUREMENT, ARTERY, RADIAL (Left) ECHOCARDIOGRAM, TRANSESOPHAGEAL  Patient Location: SICU  Anesthesia Type:General  Level of Consciousness: sedated and Patient remains intubated per anesthesia plan  Airway & Oxygen Therapy: Patient remains intubated per anesthesia plan and Patient placed on Ventilator (see vital sign flow sheet for setting)  Post-op Assessment: Report given to RN and Post -op Vital signs reviewed and stable  Post vital signs: Reviewed and stable  Last Vitals:  Vitals Value Taken Time  BP    Temp    Pulse    Resp    SpO2      Last Pain:  Vitals:   02/05/24 0707  TempSrc: Oral  PainSc: 0-No pain      Patients Stated Pain Goal: 0 (02/05/24 0707)  Complications: No notable events documented.

## 2024-02-05 NOTE — Anesthesia Procedure Notes (Signed)
 Central Venous Catheter Insertion Performed by: Leopoldo Bruckner, MD, anesthesiologist Start/End10/15/2025 7:16 AM, 02/05/2024 7:26 AM Patient location: Pre-op. Preanesthetic checklist: patient identified, IV checked, site marked, risks and benefits discussed, surgical consent, monitors and equipment checked, pre-op evaluation, timeout performed and anesthesia consent Lidocaine  1% used for infiltration and patient sedated Hand hygiene performed  and maximum sterile barriers used  Catheter size: 9 Fr Total catheter length 10. Sheath introducer Procedure performed using ultrasound to evaluate access site. Ultrasound Notes:relevant anatomy identified, ultrasound used to visualize needle entry, vessel patent under ultrasound and image(s) printed for medical record. Attempts: 1 Following insertion, line sutured and dressing applied. Post procedure assessment: blood return through all ports, free fluid flow and no air  Patient tolerated the procedure well with no immediate complications.

## 2024-02-05 NOTE — Anesthesia Postprocedure Evaluation (Signed)
 Anesthesia Post Note  Patient: Justin Galvan  Procedure(s) Performed: CORONARY ARTERY BYPASS GRAFTING (CABG) X THREE USING LEFT INTERNAL MAMMARY ARTERY, RIGHT LEG GREATER SAPHENOUS VEIN, LEFT RADIAL ARTERY (Chest) SURGICAL PROCUREMENT, ARTERY, RADIAL (Left) ECHOCARDIOGRAM, TRANSESOPHAGEAL     Patient location during evaluation: SICU Anesthesia Type: General Level of consciousness: sedated Pain management: pain level controlled Vital Signs Assessment: post-procedure vital signs reviewed and stable Respiratory status: patient remains intubated per anesthesia plan Cardiovascular status: stable Postop Assessment: no apparent nausea or vomiting Anesthetic complications: no Comments: Patient on epi gtt for anaphylactic response for protamaine    No notable events documented.  Last Vitals:  Vitals:   02/05/24 0837 02/05/24 0838  BP:    Pulse: (!) 52 (!) 54  Resp: 12 12  Temp:    SpO2: 95% 94%    Last Pain:  Vitals:   02/05/24 0707  TempSrc: Oral  PainSc: 0-No pain                 Iain Sawchuk

## 2024-02-05 NOTE — Anesthesia Procedure Notes (Signed)
 Arterial Line Insertion Start/End10/15/2025 8:10 AM, 02/05/2024 8:18 AM Performed by: Carolee Lauraine DASEN, CRNA, CRNA  Patient location: Pre-op. Preanesthetic checklist: patient identified, IV checked, site marked, risks and benefits discussed, surgical consent, monitors and equipment checked, pre-op evaluation, timeout performed and anesthesia consent Lidocaine  1% used for infiltration Right, radial was placed Catheter size: 20 G Hand hygiene performed  and maximum sterile barriers used   Attempts: 1 Following insertion, dressing applied and Biopatch. Post procedure assessment: normal and unchanged  Patient tolerated the procedure well with no immediate complications.

## 2024-02-05 NOTE — Interval H&P Note (Signed)
 History and Physical Interval Note:  02/05/2024 6:59 AM  Justin Galvan  has presented today for surgery, with the diagnosis of CAD.  The various methods of treatment have been discussed with the patient and family. After consideration of risks, benefits and other options for treatment, the patient has consented to  Procedure(s): CORONARY ARTERY BYPASS GRAFTING (CABG) (N/A) SURGICAL PROCUREMENT, ARTERY, RADIAL (Left) ECHOCARDIOGRAM, TRANSESOPHAGEAL (N/A) as a surgical intervention.  The patient's history has been reviewed, patient examined, no change in status, stable for surgery.  I have reviewed the patient's chart and labs.  Questions were answered to the patient's satisfaction.     Con RAMAN [name]

## 2024-02-05 NOTE — Discharge Summary (Signed)
 [address] [place] [address]             [phone]        Physician Discharge Summary  Patient ID: Justin Galvan MRN: [id number] DOB/AGE: [birth year] 63 y.o.  Admit date: 02/05/2024 Discharge date: 02/10/2024  Admission Diagnoses:  Patient Active Problem List   Diagnosis Date Noted   S/P CABG x 3 02/05/2024   Progressive angina (HCC) 01/14/2024   Pre-procedure lab exam 01/14/2024   Vertigo 12/30/2023   Low left ventricular ejection fraction 10/09/2023   Coronary artery disease involving native heart with unstable angina pectoris (HCC) 09/23/2023   Insulin -requiring or dependent type II diabetes mellitus (HCC) 04/26/2022   Screen for colon cancer 09/13/2021   Grade I diastolic dysfunction 08/16/2020   Vitamin D  deficiency disease 04/02/2018   MRSA carrier 11/20/2016   Chronic maxillary sinusitis 10/10/2016   Erectile dysfunction due to arterial insufficiency 10/10/2016   Hyperlipidemia with target low density lipoprotein (LDL) cholesterol less than 55 mg/dL 94/83/7982   Encounter for well adult exam with abnormal findings 04/10/2011   Hypertriglyceridemia 05/03/2009   CAD, NATIVE VESSEL, Hx. 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 male who presents for surgical evaluation of multivessel coronary artery disease.  He was 63 years old when he had his first MI - at that time he had neck burning then head burning that then radiated down his left arm.  He had another heart attack at 63 and was doing well but for the last 3 months has been having more pain.  He is taking Nitroglycerin  1-3 times a day and tried taking Imdur  but had severe headaches (even when taking at night) so he has subsequently stopped.   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  When the weather was very hot, he would have a lot more pain as it was exacerbated by heat.  He feels better recently now  that the weather has cooled down.  He is still working every day.  He is right hand dominant.   His blood pressure is moderately well controlled, it was high today but he says it's usually SBP 150s at home.  He denies syncope, palpitations, leg swelling, orthopnea or dizziness.   He is a former 60 pack year smoker, and quit in 1998. Dr. Daniel discussed the need for coronary artery bypass grafting surgery.  Potential risks, benefits, and complications of the surgery were discussed with the patient and he agreed to proceed with surgery. Pre operative carotid duplex US  showed no significant internal carotid artery stenosis bilaterally.  Hospital Course: Patient underwent a CABG x 3 utilizing LIMA to LAD, SVG to PDA, and Radial artery to OM as well as open radial harvest of the left arm and endoscopic vein harvest of the right thigh. He was transported from the OR to [facility] ICU in stable condition. He was extubated later the evening of surgery. He was weaned off Epinephrine, Levophed and Cardene drips (for radial artery conduit). He was then started on low dose Lopressor and low dose Amlodipine (for radial artery conduit).  Pacing wires were capped on POD 1. Chest tubes remained for a few days and once output decreased were removed. Foley were removed on POD 2. He was above his pre op weight and was diuresed. He was transitioned off the Insulin  drip. His pre op HGA1C was 8. He will need close medical follow up after discharge. He had expected post op blood loss anemia and did not require a transfusion. He also had mild thrombocytopenia which resolved prior to discharge. He had a fair amount of incisional/sternal pain post op. This was relieved with Oxy Q 3 PRN, Gabapentin, Lidocaine  patch, and Robaxin Q 8 PRN. EPW were removed on POD 2.  He was transferred to the 4 E. telemetry unit on postop day #3.  He has continued to make adequate progress over time in regards to his physical rehabilitation and pulmonary hygiene.  He is maintaining sinus rhythm with some PVCs. He is tolerating a cardiac diet with no difficulty and has regained independence with mobility and transfers. He feels he is ready to return home.   Consults: pulmonary/intensive care  Significant Diagnostic Studies:   EXAM: 2 VIEW(S) XRAY OF THE CHEST 02/10/2024 07:42:00 AM   COMPARISON: 02/08/2024   CLINICAL HISTORY: Status post cardiac surgery.    FINDINGS:   LINES, TUBES AND DEVICES: Right IJ central line removed.   LUNGS AND PLEURA: Low lung volume. Probable atelectatic changes at lung bases. Unchanged small bilateral pleural effusions. No focal pulmonary opacity. No pulmonary edema. No pneumothorax.   HEART AND MEDIASTINUM: Surgical staples along heart border and sternotomy wires, status post CABG.   BONES AND SOFT TISSUES: No acute osseous abnormality.   IMPRESSION: 1. Low lung volumes with probable basilar atelectasis. 2. Persistent small bilateral pleural effusions.   Electronically signed by: Waddell Calk MD 02/10/2024 08:30 AM EDT RP Workstation: [id number] EXAM: 1 VIEW(S) XRAY OF THE CHEST 02/06/2024 05:28:00 AM   Treatments: surgery:  1. CABG x 3 (SVG - RPL, left radial - OM2, LIMA - LAD) 2. Endoscopic Vein Harvest  3.  Open left radial artery harvest by Dr. Daniel on 02/05/2024.  Discharge Exam: Blood pressure (!) 160/72, pulse 75, temperature 98.3 F (36.8 C), temperature source Oral, resp. rate 20, height 5' 7 (1.702 m), weight 82.7 kg, SpO2 93%.  General appearance: alert, cooperative, and no distress Neurologic: intact Heart:  RRR, few PVC's. Lungs: Normal respiratory effort on RA, clear breath sounds Abdomen: soft, no tenderness Extremities: no peripheral edema.  Wound: the sternotomy incision, left radial arm incision,  and LE EVH incision are all well approximated and dry.    Discharge Medications:  The patient has been discharged on:   1.Beta Blocker:  Yes [  x ]                              No   [   ]                              If No, reason:  2.Ace Inhibitor/ARB: Yes [   ]                                     No  [  x  ]                                     If No, reason: Titrating beta blocker and amlodipine   3.Statin:   Yes [  x ]                  No  [  ]                  If No, reason:   4.Ecasa:  Yes  [ x  ]                  No   [   ]                  If No,  reason:  Patient had ACS upon admission:  Plavix /P2Y12 inhibitor: Yes [   ]                                      No  [   ]      Allergies as of 02/10/2024       Reactions   Protamine Anaphylaxis   Got protamine during heart surgery on 02/05/24 - became profoundly hypotensive with skin erythema   Semaglutide (0.25 Or 0.5mg -dos) Nausea And Vomiting        Medication List     STOP taking these medications    carvedilol  3.125 MG tablet Commonly known as: COREG    irbesartan  300 MG tablet Commonly known as: AVAPRO    isosorbide  mononitrate 30 MG 24 hr tablet Commonly known as: IMDUR    nitroGLYCERIN  0.4 MG SL tablet Commonly known as: NITROSTAT    tadalafil  20 MG tablet Commonly known as: Cialis        TAKE these medications    amLODipine  5 MG tablet Commonly known as: NORVASC  Take 1 tablet (5 mg total) by mouth daily.   aspirin  EC 325 MG tablet Take 1 tablet (325 mg total) by mouth daily. What changed:  medication strength how much to take when to take this additional instructions   atorvastatin  80 MG tablet Commonly known as: LIPITOR  TAKE 1 TABLET (80 MG TOTAL) BY MOUTH DAILY AT 6PM   FreeStyle Libre 3 Plus Sensor Misc Apply 1 Act topically every 14 (fourteen)  days. Change sensor every 15 days.   furosemide  20 MG tablet Commonly known as: LASIX  Take 1 tablet (20 mg total) by mouth daily. For 5 days then stop.   icosapent  Ethyl 1 g capsule Commonly known as: VASCEPA  TAKE 2 CAPSULES BY MOUTH 2 TIMES DAILY. What changed:  how much to take how to take this when to take this   indapamide  1.25 MG tablet Commonly known as: LOZOL  TAKE 1 TABLET BY MOUTH DAILY.   Insulin  Pen Needle 32G X 6 MM Misc 1 Act by Does not apply route daily.   Jardiance  10 MG Tabs tablet Generic drug: empagliflozin  TAKE 1 TABLET BY MOUTH DAILY BEFORE BREAKFAST.   metFORMIN  750 MG 24 hr tablet Commonly known as: GLUCOPHAGE -XR Take 1 tablet (750 mg total) by mouth daily  with breakfast.   methocarbamol 500 MG tablet Commonly known as: ROBAXIN Take 1 tablet (500 mg total) by mouth every 8 (eight) hours as needed for muscle spasms.   metoprolol tartrate 25 MG tablet Commonly known as: LOPRESSOR Take 1 tablet (25 mg total) by mouth 2 (two) times daily.   oxyCODONE  5 MG immediate release tablet Commonly known as: Oxy IR/ROXICODONE  Take 1 tablet (5 mg total) by mouth every 6 (six) hours as needed for up to 7 days for severe pain (pain score 7-10).   PARoxetine  40 MG tablet Commonly known as: PAXIL  TAKE 1 TABLET BY MOUTH EVERY DAY   potassium chloride  SA 20 MEQ tablet Commonly known as: KLOR-CON  M Take 1 tablet (20 mEq total) by mouth daily. For 5 days while taking the Lasix  (furosemide ).   terazosin  2 MG capsule Commonly known as: HYTRIN  TAKE 1 CAPSULE BY MOUTH EVERYDAY AT BEDTIME   Toujeo  Max SoloStar 300 UNIT/ML Solostar Pen Generic drug: insulin  glargine (2 Unit Dial) INJECT 20 UNITS INTO THE SKIN DAILY What changed: when to take this        Follow-up Information     Su, Con RAMAN, MD. Go on 03/05/2024.   Specialty: Cardiothoracic Surgery Why: Please arrive by 10:15 am in order to have chest x-ray taken PRIOR to office appointment. CXR located in same building as Dr. Linward office on the SECOND floor. Appointment time is at 11 am Contact information: [address] [place] [address] [phone]         Joshua Debby CROME, MD. Call.   Specialty: Internal Medicine Why: for a follow up regarding further diabetes management and surveillance of HGA1C 8 Contact information: [address] [place] [address] [phone]         Rana Lum CROME, NP. Go on 02/26/2024.   Specialty: Cardiology Why: Appointment time is at 10:05 am Contact information: [address] [place] [address] [phone]         Rutha Manuelita HERO, PA-C. Go on 02/20/2024.   Specialties: Physician Assistant, Thoracic  Surgery Why: Your appointment is at 11:30 Please arrive about 45 minutes early for a chest x-ray to be performed on the second floor of the same building. Contact information: [address], [place] [address] [phone]                 Signed:  Laurel JUDITHANN Becket, PA-C 02/10/2024, 9:49 AM

## 2024-02-05 NOTE — Op Note (Signed)
 Date of Surgery: 02/05/24   Preop Diagnosis:  Multivessel coronary artery disease Postop Diagnosis: Same Surgeon:  Con GORMAN Clunes, MD Assistant(s):  Con Bend, PA-C (performed right [facility]) and Justin Donald, PA-C (performed open radial artery harvest)  Experienced assistance was necessary for this case due to surgical complexity. Con Bend assisted by independently harvesting the saphenous vein endoscopically and closing the leg incision. She then assisted with retraction of delicate tissues, exposure, suctioning, and suture management during the anastomosis.  Anesthesia: GET  Procedures: 1. CABG x 3 (SVG - RPL, left radial - OM2, LIMA - LAD) 2. Endoscopic Vein Harvest  3.  Open left radial artery harvest  Cross clamp time: 94 min Bypass time: 131 min  Approach:  Median sternotomy Drains: 1 left pleural 28 fr, 1 mediastinal 28 Fr right angle, 1 mediastinal 36 Fr straight Pacing Wires: atrial and ventricular Findings of Procedure: 1. D1 too small to bypass 2. Improved RV function and LVEF on post-op TEE  Estimated Blood Loss: 990 mL Blood Products Administered: None Disposition:  ICU Specimens:  none Indication: Justin Galvan is a 63 year old man who is a former smoker with CAD s/p stents and multiple previous MIs. He is having stable angina and LHC demonstrated severe left main and multivessel CAD. He is a good candidate for CABG given his young age, good functional status, diabetes and depressed EF.   Procedure in Detail:  The risks, benefits, complications, treatment options, and expected outcomes were discussed with the patient. The possibilities of reaction to medication, pulmonary aspiration, perforation of viscus, bleeding, recurrent infection, the need for additional procedures, failure to diagnose a condition, and creating a complication requiring transfusion or operation were discussed with the patient. The patient concurred with the proposed plan, giving  informed consent. The patient was taken to the operating room, identified as Justin Galvan and the procedure verified as Coronary Artery Bypass Grafting. A Time Out was held and the above information confirmed.  The patient was prepped and draped in a sterile fashion. An endoscopic vein harvesting in the right leg was carried out by Con Bend, PA-C. An open left radial artery harvest was also carried out by Justin Zimmerman, PA-C. A standard median sternotomy was performed. The left internal mammary artery was taken down using cautery and clips and left pleural chest tube was placed.  Intervenous heparin  was given at the appropriate dose to obtain anticoagulation sufficient for CPB. The LIMA was then transected distally. The flow was excellent.   The pericardium was then opened and the pericardial well was created. The aorta was palpated and free of calcium /plaque.  Concentric purse strings were placed and the aorta was cannulated with a 20 fr EOPA cannula. After adequate de-airing this cannula was connected to the CPB circuit.  A purse string was placed around the right atrial appendage and it was cannulated with a large triple stage venous cannula. Prior to initiating CPB the LIMA was fashioned to the appropriate length.   Cardiopulmonary bypass was then initiated. The distal targets (RPL, OM2, LAD)  were then evaluated and marked. The first diagonal branch was evaluated and deemed too small for bypass.  A retrograde cardioplegia cannula was then placed.  The heart basket was then placed into the appropriate position. An antegrade vent/cardioplegia cannula was then placed. The aortic cross clamp was then applied and the heart was arrested with antegrade and retrograde cardioplegia. Cardioplegia was then administered intermittantly to ensure adequate myocardial protection.   The distal  anastomosis to the RPL was completed first. After positioning the heart, the artery was opened and RSV was  anastomosed to the artery using 7-0 prolene in a running fashion. The flow was tested and was adequate.   Next, the distal anastomosis to  to the OM2 was completed. After positioning the heart, the artery was opened and left radial artery was anastomosed to the artery using 7-0 prolene in a running fashion. The flow was tested and was adequate.     Next the LIMA to LAD anastomosis was completed in a similar fashion with 7-0 prolene.    The proximal anastomoses were then completed in the standard fashion using 6-0 prolene in a running fashion. The radial artery proximal anastomosis was performed with a 7-0 prolene.  After adequate deairing maneuvers, the cross clamp was removed and the heart was reperfused.   Examination of all surgical sites revealed good hemostasis. Atrial and ventricular epicardial pacing wires were then placed. The patient was fully ventillated and successfully weaned off of CPB. After a test dose of protamine, the patient was fully decannulated.  Following protamine administration, the patient had profound hypotension that was unresponsive to vasopressors.  During this time, the heart function was excellent but the SVR was < 500.  He was given benadryl and epinephrine with improvement in his hemodynamics.  The heparin  was fully reversed with protamine administered very slowly. Again, examination of all surgical sites were evaluated for hemostasis which was good.  Post-bypass TEE revealed improved RV/LV function and no new wall motion abnormalities.    Mediastinal drains were placed. The sternum was closed using stainless steel wires. The fascia was closed with #1 vicryl and the deep dermal with 2-0 Vicryl. The skin was closed with 3-0 Vicryl. A sterile dressing was applied over the incision.  At the end of the operation, all sponge, instruments, and needle counts were correct.   The patient tolerated the procedure without complication and was transported to the CTICU in stable  condition.  Complications: none  Specimen: None  Implants: None

## 2024-02-05 NOTE — Anesthesia Preprocedure Evaluation (Signed)
 Anesthesia Evaluation  Patient identified by MRN, date of birth, ID band Patient awake    Reviewed: Allergy & Precautions, NPO status , Patient's Chart, lab work & pertinent test results  History of Anesthesia Complications Negative for: history of anesthetic complications  Airway Mallampati: II  TM Distance: >3 FB Neck ROM: Full    Dental  (+) Edentulous Upper, Dental Advisory Given   Pulmonary neg shortness of breath, neg sleep apnea, neg COPD, neg recent URI, former smoker   breath sounds clear to auscultation       Cardiovascular hypertension, Pt. on home beta blockers and Pt. on medications + angina  + CAD, + Past MI and + Peripheral Vascular Disease   Rhythm:Regular     Neuro/Psych  PSYCHIATRIC DISORDERS Anxiety Depression    negative neurological ROS     GI/Hepatic negative GI ROS, Neg liver ROS,,,  Endo/Other  diabetes, Type 2, Insulin  Dependent    Renal/GU negative Renal ROS     Musculoskeletal  (+) Arthritis ,    Abdominal   Peds  Hematology Lab Results      Component                Value               Date                      WBC                      7.8                 02/03/2024                HGB                      12.2 (L)            02/05/2024                HCT                      36.0 (L)            02/05/2024                MCV                      86.4                02/03/2024                PLT                      229                 02/03/2024              Anesthesia Other Findings   Reproductive/Obstetrics                              Anesthesia Physical Anesthesia Plan  ASA: 4  Anesthesia Plan: General   Post-op Pain Management:    Induction: Intravenous  PONV Risk Score and Plan: 3 and Ondansetron  and Midazolam   Airway Management Planned: Oral ETT  Additional Equipment: Arterial line, CVP, Ultrasound Guidance Line Placement and TEE  Intra-op  Plan:   Post-operative Plan:  Post-operative intubation/ventilation  Informed Consent: I have reviewed the patients History and Physical, chart, labs and discussed the procedure including the risks, benefits and alternatives for the proposed anesthesia with the patient or authorized representative who has indicated his/her understanding and acceptance.     Dental advisory given  Plan Discussed with: CRNA and Surgeon  Anesthesia Plan Comments:         Anesthesia Quick Evaluation

## 2024-02-05 NOTE — Procedures (Signed)
 Extubation Procedure Note  Pt extubated following Cardiac wean. Pt follows all commands. NIF -30, VC 725 mls, cuff leak +, no stridor. ABG within acceptable range.   Patient Details:   Name: Justin Galvan DOB: 05-03-1960 MRN: [id number]   Airway Documentation:    Vent end date: 02/05/24 Vent end time: 2100   Evaluation  O2 sats: stable throughout Complications: No apparent complications Patient did tolerate procedure well. Bilateral Breath Sounds: Rhonchi, Diminished   Yes  [name] 02/05/2024, 9:43 PM

## 2024-02-05 NOTE — Brief Op Note (Signed)
 02/05/2024  3:04 PM  PATIENT:  Justin Galvan  63 y.o. male  PRE-OPERATIVE DIAGNOSIS:  Coronary Artery Disease  POST-OPERATIVE DIAGNOSIS:  Coronary Artery Disease  PROCEDURE:  CORONARY ARTERY BYPASS GRAFTING (CABG) x 3 USING  LEFT INTERNAL MAMMARY ARTERY OPEN SURGICAL PROCUREMENT LEFT RADIAL ARTERY = ENDOSCOPIC HARVEST RIGHT THIGH GREATER SAPHENOUS VEIN INTRAOPERATIVE TRANSESOPHAGEAL ECHOCARDIOGRAM Vein harvest time: Vein prep time: Radial artery harvest time: 42 min Artery prep time: 5 min SURGEON:  Surgeons and Role:    * Su, Con RAMAN, MD - Primary -LIMA to LAD -SVG to PDA -Radial artery to OM  PHYSICIAN ASSISTANT: Con Bend PA-C, Kyla Donald PA-C  ASSISTANTS: Luke Mayotte RNFA   ANESTHESIA:   general  EBL:  990 mL   BLOOD ADMINISTERED: Per perfusion records  DRAINS: Mediastinal and pleural drains   LOCAL MEDICATIONS USED:  NONE  SPECIMEN:  No Specimen  DISPOSITION OF SPECIMEN:  N/A  COUNTS:  YES  TOURNIQUET:  * No tourniquets in log *  DICTATION: .Dragon Dictation  PLAN OF CARE: Admit to inpatient   PATIENT DISPOSITION:  ICU - intubated and hemodynamically stable.   Delay start of Pharmacological VTE agent (>24hrs) due to surgical blood loss or risk of bleeding: yes

## 2024-02-05 NOTE — Discharge Instructions (Signed)
 Discharge Instructions:  1. You may shower, please wash incisions daily with soap and water  and keep dry.  If you wish to cover wounds with dressing you may do so but please keep clean and change daily.  No tub baths or swimming until incisions have completely healed.  If your incisions become red or develop any drainage please call our office at [phone]  2. No Driving until cleared by Dr. Linward office and you are no longer using narcotic pain medications  3. Monitor your weight daily.. Please use the same scale and weigh at same time... If you gain 5-10 lbs in 48 hours with associated lower extremity swelling, please contact our office at [phone]  4. Fever of 101.5 for at least 24 hours with no source, please contact our office at [phone]  5. Activity- up as tolerated, please walk at least 3 times per day.  Avoid strenuous activity, no lifting, pushing, or pulling with your arms over 8-10 lbs for a minimum of 6 weeks  6. If any questions or concerns arise, please do not hesitate to contact our office at [phone]

## 2024-02-05 NOTE — Anesthesia Procedure Notes (Signed)
 Procedure Name: Intubation Date/Time: 02/05/2024 9:09 AM  Performed by: Carolee Lauraine DASEN, CRNAPre-anesthesia Checklist: Patient identified, Emergency Drugs available, Suction available and Patient being monitored Patient Re-evaluated:Patient Re-evaluated prior to induction Oxygen Delivery Method: Circle System Utilized Preoxygenation: Pre-oxygenation with 100% oxygen Induction Type: IV induction and Rapid sequence Ventilation: Mask ventilation without difficulty, Mask ventilation with difficulty, Two handed mask ventilation required and Oral airway inserted - appropriate to patient size Laryngoscope Size: Cleotilde and 2 Grade View: Grade I Tube type: Oral Tube size: 8.0 mm Number of attempts: 1 Airway Equipment and Method: Stylet and Oral airway Placement Confirmation: ETT inserted through vocal cords under direct vision, positive ETCO2 and breath sounds checked- equal and bilateral Secured at: 23 cm Tube secured with: Tape Dental Injury: Teeth and Oropharynx as per pre-operative assessment

## 2024-02-06 ENCOUNTER — Encounter (HOSPITAL_COMMUNITY): Payer: Self-pay

## 2024-02-06 ENCOUNTER — Inpatient Hospital Stay (HOSPITAL_COMMUNITY)

## 2024-02-06 DIAGNOSIS — T782XXA Anaphylactic shock, unspecified, initial encounter: Secondary | ICD-10-CM

## 2024-02-06 DIAGNOSIS — E119 Type 2 diabetes mellitus without complications: Secondary | ICD-10-CM

## 2024-02-06 DIAGNOSIS — Z951 Presence of aortocoronary bypass graft: Secondary | ICD-10-CM | POA: Diagnosis not present

## 2024-02-06 DIAGNOSIS — I25110 Atherosclerotic heart disease of native coronary artery with unstable angina pectoris: Secondary | ICD-10-CM | POA: Diagnosis not present

## 2024-02-06 DIAGNOSIS — E876 Hypokalemia: Secondary | ICD-10-CM

## 2024-02-06 DIAGNOSIS — Z87891 Personal history of nicotine dependence: Secondary | ICD-10-CM | POA: Diagnosis not present

## 2024-02-06 LAB — CBC
HCT: 29.5 % — ABNORMAL LOW (ref 39.0–52.0)
HCT: 30.8 % — ABNORMAL LOW (ref 39.0–52.0)
Hemoglobin: 9.9 g/dL — ABNORMAL LOW (ref 13.0–17.0)
Hemoglobin: 10.3 g/dL — ABNORMAL LOW (ref 13.0–17.0)
MCH: 29.5 pg (ref 26.0–34.0)
MCH: 29.8 pg (ref 26.0–34.0)
MCHC: 33.6 g/dL (ref 30.0–36.0)
MCHC: 33.4 g/dL (ref 30.0–36.0)
MCV: 87.8 fL (ref 80.0–100.0)
MCV: 89 fL (ref 80.0–100.0)
Platelets: 149 K/uL — ABNORMAL LOW (ref 150–400)
Platelets: 131 K/uL — ABNORMAL LOW (ref 150–400)
RBC: 3.36 MIL/uL — ABNORMAL LOW (ref 4.22–5.81)
RBC: 3.46 MIL/uL — ABNORMAL LOW (ref 4.22–5.81)
RDW: 13.2 % (ref 11.5–15.5)
RDW: 13.3 % (ref 11.5–15.5)
WBC: 10.4 K/uL (ref 4.0–10.5)
WBC: 10.1 K/uL (ref 4.0–10.5)
nRBC: 0 % (ref 0.0–0.2)
nRBC: 0 % (ref 0.0–0.2)

## 2024-02-06 LAB — BASIC METABOLIC PANEL WITH GFR
Anion gap: 8 (ref 5–15)
Anion gap: 7 (ref 5–15)
BUN: 14 mg/dL (ref 8–23)
BUN: 16 mg/dL (ref 8–23)
CO2: 18 mmol/L — ABNORMAL LOW (ref 22–32)
CO2: 20 mmol/L — ABNORMAL LOW (ref 22–32)
Calcium: 6.9 mg/dL — ABNORMAL LOW (ref 8.9–10.3)
Calcium: 7.5 mg/dL — ABNORMAL LOW (ref 8.9–10.3)
Chloride: 111 mmol/L (ref 98–111)
Chloride: 109 mmol/L (ref 98–111)
Creatinine, Ser: 0.87 mg/dL (ref 0.61–1.24)
Creatinine, Ser: 0.92 mg/dL (ref 0.61–1.24)
GFR, Estimated: >60 mL/min (ref >60)
GFR, Estimated: >60 mL/min (ref >60)
Glucose, Bld: 149 mg/dL — ABNORMAL HIGH (ref 70–99)
Glucose, Bld: 209 mg/dL — ABNORMAL HIGH (ref 70–99)
Potassium: 3.5 mmol/L (ref 3.5–5.1)
Potassium: 4.1 mmol/L (ref 3.5–5.1)
Sodium: 137 mmol/L (ref 135–145)
Sodium: 136 mmol/L (ref 135–145)

## 2024-02-06 LAB — GLUCOSE, CAPILLARY
Glucose-Capillary: 109 mg/dL — ABNORMAL HIGH (ref 70–99)
Glucose-Capillary: 124 mg/dL — ABNORMAL HIGH (ref 70–99)
Glucose-Capillary: 127 mg/dL — ABNORMAL HIGH (ref 70–99)
Glucose-Capillary: 130 mg/dL — ABNORMAL HIGH (ref 70–99)
Glucose-Capillary: 133 mg/dL — ABNORMAL HIGH (ref 70–99)
Glucose-Capillary: 134 mg/dL — ABNORMAL HIGH (ref 70–99)
Glucose-Capillary: 135 mg/dL — ABNORMAL HIGH (ref 70–99)
Glucose-Capillary: 136 mg/dL — ABNORMAL HIGH (ref 70–99)
Glucose-Capillary: 147 mg/dL — ABNORMAL HIGH (ref 70–99)
Glucose-Capillary: 159 mg/dL — ABNORMAL HIGH (ref 70–99)
Glucose-Capillary: 166 mg/dL — ABNORMAL HIGH (ref 70–99)
Glucose-Capillary: 181 mg/dL — ABNORMAL HIGH (ref 70–99)
Glucose-Capillary: 196 mg/dL — ABNORMAL HIGH (ref 70–99)
Glucose-Capillary: 218 mg/dL — ABNORMAL HIGH (ref 70–99)

## 2024-02-06 LAB — TRIGLYCERIDES: Triglycerides: 63 mg/dL (ref <150)

## 2024-02-06 LAB — MAGNESIUM
Magnesium: 1.9 mg/dL (ref 1.7–2.4)
Magnesium: 2.2 mg/dL (ref 1.7–2.4)

## 2024-02-06 MED ORDER — CALCIUM GLUCONATE-NACL 2-0.675 GM/100ML-% IV SOLN
2.0000 g | Freq: Once | INTRAVENOUS | Status: AC
  Administered 2024-02-06: 2000 mg via INTRAVENOUS
  Filled 2024-02-06: qty 100

## 2024-02-06 MED ORDER — FUROSEMIDE 10 MG/ML IJ SOLN
40.0000 mg | Freq: Once | INTRAMUSCULAR | Status: AC
  Administered 2024-02-06: 40 mg via INTRAVENOUS
  Filled 2024-02-06: qty 4

## 2024-02-06 MED ORDER — POTASSIUM CHLORIDE CRYS ER 20 MEQ PO TBCR
20.0000 meq | EXTENDED_RELEASE_TABLET | ORAL | Status: AC
  Administered 2024-02-06 (×3): 20 meq via ORAL
  Filled 2024-02-06 (×3): qty 1

## 2024-02-06 MED ORDER — INSULIN GLARGINE-YFGN 100 UNIT/ML ~~LOC~~ SOLN
12.0000 [IU] | Freq: Every day | SUBCUTANEOUS | Status: DC
  Administered 2024-02-06: 12 [IU] via SUBCUTANEOUS
  Filled 2024-02-06 (×2): qty 0.12

## 2024-02-06 MED ORDER — AMLODIPINE BESYLATE 5 MG PO TABS
2.5000 mg | ORAL_TABLET | Freq: Every day | ORAL | Status: DC
  Administered 2024-02-06 – 2024-02-09 (×4): 2.5 mg via ORAL
  Filled 2024-02-06 (×4): qty 1

## 2024-02-06 MED ORDER — INSULIN ASPART 100 UNIT/ML IJ SOLN
0.0000 [IU] | INTRAMUSCULAR | Status: DC
  Administered 2024-02-06 – 2024-02-07 (×4): 4 [IU] via SUBCUTANEOUS

## 2024-02-06 MED ORDER — MAGNESIUM SULFATE 2 GM/50ML IV SOLN
2.0000 g | Freq: Once | INTRAVENOUS | Status: AC
  Administered 2024-02-06: 2 g via INTRAVENOUS
  Filled 2024-02-06: qty 50

## 2024-02-06 MED FILL — Heparin Sodium (Porcine) Inj 1000 Unit/ML: Qty: 1000 | Status: AC

## 2024-02-06 MED FILL — Lidocaine HCl Local Preservative Free (PF) Inj 2%: INTRAMUSCULAR | Qty: 14 | Status: AC

## 2024-02-06 MED FILL — Thrombin (Recombinant) For Soln 20000 Unit: CUTANEOUS | Qty: 1 | Status: AC

## 2024-02-06 MED FILL — Potassium Chloride Inj 2 mEq/ML: INTRAVENOUS | Qty: 40 | Status: AC

## 2024-02-06 NOTE — Plan of Care (Signed)

## 2024-02-06 NOTE — Progress Notes (Signed)
 1 Day Post-Op Procedure(s) (LRB): CORONARY ARTERY BYPASS GRAFTING (CABG) X THREE USING LEFT INTERNAL MAMMARY ARTERY, RIGHT LEG GREATER SAPHENOUS VEIN, LEFT RADIAL ARTERY (N/A) SURGICAL PROCUREMENT, ARTERY, RADIAL (Left) ECHOCARDIOGRAM, TRANSESOPHAGEAL (N/A) Subjective: Extubated overnight, no issues Epi off, on some levophed this morning Reports feeling well, no nausea Stood this morning without orthostasis  Objective: Vital signs in last 24 hours: BP 121/67   Pulse 85   Temp 98.5 F (36.9 C) (Oral)   Resp 17   Ht 5' 7 (1.702 m)   Wt 85.8 kg   SpO2 96%   BMI 29.63 kg/m  Filed Weights   02/05/24 0707 02/06/24 0549  Weight: 79.4 kg 85.8 kg    Hemodynamic parameters for last 24 hours: CVP:  [0 mmHg-16 mmHg] 4 mmHg CO:  [5.3 L/min-10 L/min] 7.6 L/min CI:  [2.8 L/min/m2-5.2 L/min/m2] 4 L/min/m2  Intake/Output from previous day: 10/15 0701 - 10/16 0700 In: 7730.6 [P.O.:350; I.V.:3981.6; Blood:510; IV Piggyback:2889] Out: 4215 [Urine:2430; Blood:990; Chest Tube:795] Intake/Output this shift: Total I/O In: 59.1 [I.V.:59.1] Out: 60 [Urine:30; Chest Tube:30]  Physical Exam: General - Resting comfortably in bed CV - RRR Resp - Unlabored on [place] Abd - Soft, ND/NT Ext - mild lower extremity edema  Lab Results:    Latest Ref Rng & Units 02/06/2024    5:00 AM 02/05/2024   10:00 PM 02/05/2024    9:59 PM  CBC  WBC 4.0 - 10.5 K/uL 10.4  12.4    Hemoglobin 13.0 - 17.0 g/dL 9.9  89.0  9.5   Hematocrit 39.0 - 52.0 % 29.5  31.8  28.0   Platelets 150 - 400 K/uL 149  159        Latest Ref Rng & Units 02/06/2024    5:00 AM 02/05/2024   10:00 PM 02/05/2024    9:59 PM  CMP  Glucose 70 - 99 mg/dL 850  834    BUN 8 - 23 mg/dL 14  16    Creatinine 9.38 - 1.24 mg/dL 9.12  9.16    Sodium 864 - 145 mmol/L 137  137  140   Potassium 3.5 - 5.1 mmol/L 3.5  3.6  3.8   Chloride 98 - 111 mmol/L 111  110    CO2 22 - 32 mmol/L 18  20    Calcium  8.9 - 10.3 mg/dL 6.9  7.1      CXR: No  pleural effusion or pneumothorax; some pulmonary edema  Assessment/Plan: S/P Procedure(s) (LRB): CORONARY ARTERY BYPASS GRAFTING (CABG) X THREE USING LEFT INTERNAL MAMMARY ARTERY, RIGHT LEG GREATER SAPHENOUS VEIN, LEFT RADIAL ARTERY (N/A) SURGICAL PROCUREMENT, ARTERY, RADIAL (Left) ECHOCARDIOGRAM, TRANSESOPHAGEAL (N/A) POD1 s/p CABG x 3 with left radial artery progressing well NEURO- intact  Pain control PRN CV- in SR around 80-90 bpm             Cap pacing wires  Plan to start 2.5 mg norvasc  this afternoon if off levo and BP stable  Discontinue Flotrac RESP- Stable on 1-2 L Omega             Continue IS, pulm hygiene, ambulation  Keep all Chest tubes today RENAL- creatinine and lytes Ok  Weight up 14 lbs from before surgery              Start Lasix  this afternoon if BP better  Foley will stay for today GI- tolerating clears             Advance diet  BM: None since surgery Endo-  BG well controlled Transition to lantus  + ISS q4 hr ID- Ancef ppx DVT ppx - SCD + No chemical ppx yet  Dispo: ICU   LOS: 1 day    Con RAMAN Rudolfo Brandow 02/06/2024

## 2024-02-06 NOTE — Progress Notes (Signed)
   [address] [place] [address]             [phone]    POD # 1 CABG  Resting comfortably  BP 130/63   Pulse 81   Temp 97.8 F (36.6 C) (Oral)   Resp (!) 24   Ht 5' 7 (1.702 m)   Wt 85.8 kg   SpO2 99%   BMI 29.63 kg/m   2L [place] 98%  No drips   Intake/Output Summary (Last 24 hours) at 02/06/2024 1739 Last data filed at 02/06/2024 1500 Gross per 24 hour  Intake 3076.47 ml  Output 2110 ml  Net 966.47 ml   280 ml from CT today K 4.1, creatinine 0.92 Hct 31  Doing well POD# 1  Aloria Looper C. Kerrin, MD [facility] [phone]

## 2024-02-06 NOTE — Plan of Care (Signed)
  Problem: Education: Goal: Knowledge of General Education information will improve Description: Including pain rating scale, medication(s)/side effects and non-pharmacologic comfort measures Outcome: Progressing   Problem: Education: Goal: Will demonstrate proper wound care and an understanding of methods to prevent future damage Outcome: Progressing Goal: Knowledge of disease or condition will improve Outcome: Progressing Goal: Knowledge of the prescribed therapeutic regimen will improve Outcome: Progressing   Problem: Activity: Goal: Risk for activity intolerance will decrease Outcome: Progressing   Problem: Cardiac: Goal: Will achieve and/or maintain hemodynamic stability Outcome: Progressing   Problem: Clinical Measurements: Goal: Postoperative complications will be avoided or minimized Outcome: Progressing   Problem: Respiratory: Goal: Respiratory status will improve Outcome: Progressing   Problem: Skin Integrity: Goal: Wound healing without signs and symptoms of infection Outcome: Progressing Goal: Risk for impaired skin integrity will decrease Outcome: Progressing   Problem: Urinary Elimination: Goal: Ability to achieve and maintain adequate renal perfusion and functioning will improve Outcome: Progressing

## 2024-02-06 NOTE — Consult Note (Signed)
 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   Appears he recently re-established with cardiology in September 2025. Reportedly having exertional angina worsening since April. He was scheduled for cardiac cath which was completed on 01/21/24 showing stenosis: diag 90%, prox RCA 90, mid RCA 100, prox cx 99%, mid LM to distal LM 70%. Echo same day showed EF 45-50%, RWMA, G1DD, trivial MR. He was referred to [facility] who planned for CABG.   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 rate 17, height 5' 7 (1.702 m), weight 85.8 kg, SpO2 96%. CVP:  [0 mmHg-16 mmHg] 4 mmHg CO:  [5.3 L/min-10 L/min] 7.6 L/min CI:  [2.8 L/min/m2-5.2 L/min/m2] 4 L/min/m2  Vent Mode: PSV;CPAP FiO2 (%):  [40 %-50 %] 40 % Set Rate:  [4 bmp-16 bmp] 4 bmp Vt Set:  [530 mL] 530 mL PEEP:  [5 cmH20] 5 cmH20 Pressure Support:  [10 cmH20] 10 cmH20 Plateau  Pressure:  [17 cmH20] 17 cmH20   Intake/Output Summary (Last 24 hours) at 02/06/2024 0811 Last data filed at 02/06/2024 0800 Gross per 24 hour  Intake 7789.7 ml  Output 4275 ml  Net 3514.7 ml   Filed Weights   02/05/24 0707 02/06/24 0549  Weight: 79.4 kg 85.8 kg    Examination: General: Elderly male, lying on the bed HEENT: [place]/AT, eyes anicteric.  moist mucus membranes Neuro: Alert, awake following commands Chest: Central sternotomy incision looks clean and dry, coarse breath sounds, no wheezes or rhonchi.  Pacer wire, mediastinal and chest tubes in place Heart: Regular rate and rhythm Abdomen: Soft, nontender, nondistended, bowel sounds present  Labs and images reviewed  Patient Lines/Drains/Airways Status     Active Line/Drains/Airways     Name Placement date Placement time Site Days   Arterial Line 02/05/24 Right Radial 02/05/24  0810  Radial  1   Peripheral IV 02/05/24 16 G Right Forearm 02/05/24  0740  Forearm  1   Urethral Catheter M. Rebecka, RN Latex;Temperature probe 16 Fr. 02/05/24  0909  Latex;Temperature probe  1   Y Chest Tube 1, 2, and 3 1 Left;Anterior Pleural 28 Fr. 2 Anterior;Medial Mediastinal 28 Fr. 3 Anterior;Medial Mediastinal 36 Fr. 02/05/24  --  -- 1   Wound 02/05/24 1225 Surgical Closed Surgical Incision Arm Left 02/05/24  1225  Arm  1   Wound 02/05/24 1335 Surgical Closed Surgical Incision Chest Other (Comment) 02/05/24  1335  Chest  1   Wound 02/05/24 1438 Surgical Closed  Surgical Incision Leg Right 02/05/24  1438  Leg  1         Resolved Hospital Problem list    Assessment & Plan:  Multivessel coronary artery disease s/p CABG x 3 with radial artery harvest Continue aspirin  and statin Hold metoprolol in setting of vasopressor requirement Chest tube management [facility]CTS Chest tube output was 795 cc Continue multimodality pain management with tramadol, oxycodone  and morphine  Closely monitor chest tube output Will start low-dose amlodipine  once  Levophed is off  Anaphylactic shock due to protamine Patient required multiple vasopressor support and inotropes after receiving ketamine Currently off epinephrine, coming down on Levophed No rash noted  Acute respiratory insufficiency, postop Patient was successfully extubated per rapid weaning protocol Encourage incentive spirometry and ambulation  Hyperlipidemia Continue atorvastatin   Diabetes type 2 Patient hemoglobin A1c is 8 Transition insulin  infusion to sliding scale and long-acting insulin  with CBG goal 140-180  Expected perioperative blood loss anemia Thrombocytopenia due to CPB Monitor H/H and PLT counts  Hypokalemia/hypocalcemia Continue aggressive electrolyte replacement and monitor  Depression  Continue Paxil  40mg  daily   BPH  Continue terazosin  2mg  daily   Labs   CBC: Recent Labs  Lab 02/03/24 1200 02/05/24 0914 02/05/24 1255 02/05/24 1326 02/05/24 1607 02/05/24 2058 02/05/24 2159 02/05/24 2200 02/06/24 0500  WBC 7.8  --   --   --  13.5*  --   --  12.4* 10.4  HGB 16.1   < > 8.8*  8.2*   < > 12.4* 8.2* 9.5* 10.9* 9.9*  HCT 47.2   < > 25.8*  24.0*   < > 35.7* 24.0* 28.0* 31.8* 29.5*  MCV 86.4  --   --   --  84.8  --   --  87.1 87.8  PLT 229  --  133*  --  172  --   --  159 149*   < > = values in this interval not displayed.    Basic Metabolic Panel: Recent Labs  Lab 02/03/24 1200 02/05/24 0914 02/05/24 1326 02/05/24 1426 02/05/24 1436 02/05/24 1441 02/05/24 1714 02/05/24 2058 02/05/24 2159 02/05/24 2200 02/06/24 0500  NA 138   < > 139   < > 142   < > 138 144 140 137 137  K 4.3   < > 4.3   < > 3.2*   < > 3.1* 3.2* 3.8 3.6 3.5  CL 100   < > 101  --  107  --  112*  --   --  110 111  CO2 27  --   --   --   --   --  19*  --   --  20* 18*  GLUCOSE 171*   < > 133*  --  123*  --  131*  --   --  165* 149*  BUN 19   < > 21  --  18  --  15  --   --  16 14  CREATININE 1.02   < > 0.80  --  0.70  --  0.68  --   --  0.83 0.87  CALCIUM  9.5  --    --   --   --   --  6.7*  --   --  7.1* 6.9*  MG  --   --   --   --   --   --   --   --   --   --  1.9   < > = values in this  interval not displayed.   GFR: Estimated Creatinine Clearance: 88.6 mL/min (by C-G formula based on SCr of 0.87 mg/dL). Recent Labs  Lab 02/03/24 1200 02/05/24 1607 02/05/24 2200 02/06/24 0500  WBC 7.8 13.5* 12.4* 10.4    Liver Function Tests: Recent Labs  Lab 02/03/24 1200  AST 23  ALT 24  ALKPHOS 67  BILITOT 0.9  PROT 7.2  ALBUMIN 4.5   No results for input(s): LIPASE, AMYLASE in the last 168 hours. No results for input(s): AMMONIA in the last 168 hours.  ABG    Component Value Date/Time   PHART 7.307 (L) 02/05/2024 2159   PCO2ART 43.8 02/05/2024 2159   PO2ART 96 02/05/2024 2159   HCO3 21.9 02/05/2024 2159   TCO2 23 02/05/2024 2159   ACIDBASEDEF 4.0 (H) 02/05/2024 2159   O2SAT 97 02/05/2024 2159     Coagulation Profile: Recent Labs  Lab 02/03/24 1200 02/05/24 1607  INR 0.9 1.4*    Cardiac Enzymes: No results for input(s): CKTOTAL, CKMB, CKMBINDEX, TROPONINI in the last 168 hours.  HbA1C: Hgb A1c MFr Bld  Date/Time Value Ref Range Status  02/03/2024 12:00 PM 8.0 (H) 4.8 - 5.6 % Final    Comment:    (NOTE) Diagnosis of Diabetes The following HbA1c ranges recommended by the American Diabetes Association (ADA) may be used as an aid in the diagnosis of diabetes mellitus.  Hemoglobin             Suggested A1C NGSP%              Diagnosis  <5.7                   Non Diabetic  5.7-6.4                Pre-Diabetic  >6.4                   Diabetic  <7.0                   Glycemic control for                       adults with diabetes.    09/23/2023 11:24 AM 8.0 (H) 4.6 - 6.5 % Final    Comment:    Glycemic Control Guidelines for People with Diabetes:Non Diabetic:  <6%Goal of Therapy: <7%Additional Action Suggested:  >8%     CBG: Recent Labs  Lab 02/06/24 0251 02/06/24 0457 02/06/24 0556 02/06/24 0658  02/06/24 0748  GLUCAP 135* 159* 136* 147* 134*    The patient is critically ill due to post CABG/anaphylactic shock requiring titration of vasopressor support.  Critical care was necessary to treat or prevent imminent or life-threatening deterioration.  Critical care was time spent personally by me on the following activities: development of treatment plan with patient and/or surrogate as well as nursing, discussions with consultants, evaluation of patient's response to treatment, examination of patient, obtaining history from patient or surrogate, ordering and performing treatments and interventions, ordering and review of laboratory studies, ordering and review of radiographic studies, pulse oximetry, re-evaluation of patient's condition and participation in multidisciplinary rounds.   During this encounter critical care time was devoted to patient care services described in this note for 35 minutes.     Valinda Novas, MD [facility] See Amion for pager If no response to pager, please call [phone] until 7pm After 7pm, Please call E-link [phone]

## 2024-02-06 NOTE — TOC Initial Note (Signed)
 Transition of Care [facility]) - Initial/Assessment Note    Patient Details  Name: Justin Galvan MRN: [id number] Date of Birth: [birth year]  [facility] CM/SW Contact:    Sudie Erminio Deems, RN Phone Number: 02/06/2024, 12:11 PM  Clinical Narrative: Patient POD-1 CABG. PTA patient was independent from home with spouse. Patient's mother and sister were at the bedside during the visit. Patient has insurance and PCP. Patient is being followed by [facility] via the [facility] office protocol for [facility]. Inpatient Case Manager will continue to follow for additional needs.      Expected Discharge Plan: Home w Home Health Services Barriers to Discharge: Continued Medical Work up   Patient Goals and CMS Choice Patient states their goals for this hospitalization and ongoing recovery are:: Plan to return home once stable.  Expected Discharge Plan and Services In-house Referral: NA Discharge Planning Services: CM Consult Post Acute Care Choice: Home Health Living arrangements for the past 2 months: Single Family Home  Prior Living Arrangements/Services Living arrangements for the past 2 months: Single Family Home Lives with:: Spouse Patient language and need for interpreter reviewed:: Yes Do you feel safe going back to the place where you live?: Yes      Need for Family Participation in Patient Care: Yes (Comment) Care giver support system in place?: Yes (comment)   Criminal Activity/Legal Involvement Pertinent to Current Situation/Hospitalization: No - Comment as needed  Activities of Daily Living   ADL Screening (condition at time of admission) Independently performs ADLs?: Yes (appropriate for developmental age) Is the patient deaf or have difficulty hearing?: No Does the patient have difficulty seeing, even when wearing glasses/contacts?: No Does the patient have difficulty concentrating, remembering, or making decisions?: No  Permission  Sought/Granted Permission sought to share information with : Family Supports, Case Manager   Emotional Assessment Appearance:: Appears stated age Attitude/Demeanor/Rapport: Engaged Affect (typically observed): Appropriate Orientation: : Oriented to Self, Oriented to Place, Oriented to  Time, Oriented to Situation Alcohol / Substance Use: Not Applicable Psych Involvement: No (comment)  Admission diagnosis:  Atherosclerosis of native coronary artery of native heart without angina pectoris [I25.10] S/P CABG x 3 [Z95.1] Patient Active Problem List   Diagnosis Date Noted   S/P CABG x 3 02/05/2024   Progressive angina (HCC) 01/14/2024   Pre-procedure lab exam 01/14/2024   Vertigo 12/30/2023   Low left ventricular ejection fraction 10/09/2023   Coronary artery disease involving native heart with unstable angina pectoris (HCC) 09/23/2023   Insulin -requiring or dependent type II diabetes mellitus (HCC) 04/26/2022   Screen for colon cancer 09/13/2021   Grade I diastolic dysfunction 08/16/2020   Vitamin D  deficiency disease 04/02/2018   MRSA carrier 11/20/2016   Chronic maxillary sinusitis 10/10/2016   Erectile dysfunction due to arterial insufficiency 10/10/2016   Hyperlipidemia with target low density lipoprotein (LDL) cholesterol less than 55 mg/dL 94/83/7982   Encounter for well adult exam with abnormal findings 04/10/2011   Hypertriglyceridemia 05/03/2009   CAD, NATIVE VESSEL, Hx. 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) 08/19/2008   Depression with anxiety 08/19/2008   Essential hypertension 08/19/2008   BPH with obstruction/lower urinary tract symptoms 08/19/2008   PCP:  Joshua Debby CROME, MD Pharmacy:   [facility] [phone] - [place] [place] [address] [place] [address] Phone: [phone] Fax: [phone]  [facility] [address] [place] [address] Phone: [phone] Fax: [phone]  Social Drivers of Health (SDOH) Social History: SDOH Screenings   Food Insecurity: Patient Unable To Answer (02/05/2024)  Depression (PHQ2-9): Low Risk  (09/23/2023)  Tobacco Use: Medium Risk (02/05/2024)   Readmission Risk Interventions     No data to display

## 2024-02-07 ENCOUNTER — Inpatient Hospital Stay (HOSPITAL_COMMUNITY)

## 2024-02-07 ENCOUNTER — Encounter (HOSPITAL_COMMUNITY): Payer: Self-pay

## 2024-02-07 DIAGNOSIS — T782XXA Anaphylactic shock, unspecified, initial encounter: Secondary | ICD-10-CM | POA: Diagnosis not present

## 2024-02-07 DIAGNOSIS — I25110 Atherosclerotic heart disease of native coronary artery with unstable angina pectoris: Secondary | ICD-10-CM | POA: Diagnosis not present

## 2024-02-07 DIAGNOSIS — D696 Thrombocytopenia, unspecified: Secondary | ICD-10-CM

## 2024-02-07 DIAGNOSIS — Z87891 Personal history of nicotine dependence: Secondary | ICD-10-CM | POA: Diagnosis not present

## 2024-02-07 DIAGNOSIS — Z951 Presence of aortocoronary bypass graft: Secondary | ICD-10-CM | POA: Diagnosis not present

## 2024-02-07 LAB — BASIC METABOLIC PANEL WITH GFR
Anion gap: 9 (ref 5–15)
BUN: 21 mg/dL (ref 8–23)
CO2: 23 mmol/L (ref 22–32)
Calcium: 8.2 mg/dL — ABNORMAL LOW (ref 8.9–10.3)
Chloride: 105 mmol/L (ref 98–111)
Creatinine, Ser: 1 mg/dL (ref 0.61–1.24)
GFR, Estimated: >60 mL/min (ref >60)
Glucose, Bld: 171 mg/dL — ABNORMAL HIGH (ref 70–99)
Potassium: 4.1 mmol/L (ref 3.5–5.1)
Sodium: 137 mmol/L (ref 135–145)

## 2024-02-07 LAB — GLUCOSE, CAPILLARY
Glucose-Capillary: 176 mg/dL — ABNORMAL HIGH (ref 70–99)
Glucose-Capillary: 287 mg/dL — ABNORMAL HIGH (ref 70–99)
Glucose-Capillary: 208 mg/dL — ABNORMAL HIGH (ref 70–99)
Glucose-Capillary: 118 mg/dL — ABNORMAL HIGH (ref 70–99)
Glucose-Capillary: 160 mg/dL — ABNORMAL HIGH (ref 70–99)

## 2024-02-07 LAB — CBC
HCT: 28.5 % — ABNORMAL LOW (ref 39.0–52.0)
Hemoglobin: 9.6 g/dL — ABNORMAL LOW (ref 13.0–17.0)
MCH: 29.7 pg (ref 26.0–34.0)
MCHC: 33.7 g/dL (ref 30.0–36.0)
MCV: 88.2 fL (ref 80.0–100.0)
Platelets: 124 K/uL — ABNORMAL LOW (ref 150–400)
RBC: 3.23 MIL/uL — ABNORMAL LOW (ref 4.22–5.81)
RDW: 13.4 % (ref 11.5–15.5)
WBC: 8.6 K/uL (ref 4.0–10.5)
nRBC: 0 % (ref 0.0–0.2)

## 2024-02-07 MED ORDER — HEPARIN SODIUM (PORCINE) 5000 UNIT/ML IJ SOLN
5000.0000 [IU] | Freq: Three times a day (TID) | INTRAMUSCULAR | Status: DC
  Administered 2024-02-07 – 2024-02-10 (×9): 5000 [IU] via SUBCUTANEOUS
  Filled 2024-02-07 (×9): qty 1

## 2024-02-07 MED ORDER — FUROSEMIDE 10 MG/ML IJ SOLN
40.0000 mg | Freq: Once | INTRAMUSCULAR | Status: AC
  Administered 2024-02-07: 40 mg via INTRAVENOUS
  Filled 2024-02-07: qty 4

## 2024-02-07 MED ORDER — INFLUENZA VAC SPLIT HIGH-DOSE 0.5 ML IM SUSY
0.5000 mL | PREFILLED_SYRINGE | INTRAMUSCULAR | Status: DC | PRN
  Filled 2024-02-07: qty 0.5

## 2024-02-07 MED ORDER — FUROSEMIDE 10 MG/ML IJ SOLN: 40.0000 mg | Freq: Once | INTRAMUSCULAR | Status: DC

## 2024-02-07 MED ORDER — LIDOCAINE 5 % EX PTCH
1.0000 | MEDICATED_PATCH | CUTANEOUS | Status: DC
  Administered 2024-02-07 – 2024-02-09 (×3): 1 via TRANSDERMAL
  Filled 2024-02-07 (×4): qty 1

## 2024-02-07 MED ORDER — GABAPENTIN 100 MG PO CAPS
100.0000 mg | ORAL_CAPSULE | Freq: Two times a day (BID) | ORAL | Status: DC
  Administered 2024-02-07 (×2): 100 mg via ORAL
  Filled 2024-02-07 (×2): qty 1

## 2024-02-07 MED ORDER — INSULIN ASPART 100 UNIT/ML IJ SOLN
0.0000 [IU] | Freq: Three times a day (TID) | INTRAMUSCULAR | Status: DC
  Administered 2024-02-07: 8 [IU] via SUBCUTANEOUS
  Administered 2024-02-07: 12 [IU] via SUBCUTANEOUS
  Administered 2024-02-08: 2 [IU] via SUBCUTANEOUS

## 2024-02-07 MED ORDER — METOPROLOL TARTRATE 25 MG/10 ML ORAL SUSPENSION: 25.0000 mg | Freq: Two times a day (BID) | ORAL | Status: DC

## 2024-02-07 MED ORDER — METHOCARBAMOL 500 MG PO TABS
500.0000 mg | ORAL_TABLET | Freq: Three times a day (TID) | ORAL | Status: DC | PRN
  Administered 2024-02-07 – 2024-02-08 (×3): 500 mg via ORAL
  Filled 2024-02-07 (×3): qty 1

## 2024-02-07 MED ORDER — METOPROLOL TARTRATE 25 MG PO TABS
25.0000 mg | ORAL_TABLET | Freq: Two times a day (BID) | ORAL | Status: DC
  Administered 2024-02-07 – 2024-02-10 (×7): 25 mg via ORAL
  Filled 2024-02-07 (×7): qty 1

## 2024-02-07 MED ORDER — INSULIN GLARGINE-YFGN 100 UNIT/ML ~~LOC~~ SOLN
16.0000 [IU] | Freq: Every day | SUBCUTANEOUS | Status: DC
  Administered 2024-02-07 – 2024-02-10 (×4): 16 [IU] via SUBCUTANEOUS
  Filled 2024-02-07 (×4): qty 0.16

## 2024-02-07 MED ADMIN — Potassium Chloride Microencapsulated Crys ER Tab 20 mEq: 20 meq | ORAL | NDC 00245531989

## 2024-02-07 MED FILL — Potassium Chloride Microencapsulated Crys ER Tab 20 mEq: 20.0000 meq | ORAL | Qty: 1 | Status: AC

## 2024-02-07 MED FILL — Insulin Glargine-yfgn Inj 100 Unit/ML: 16.0000 [IU] | SUBCUTANEOUS | Qty: 0.16 | Status: CN

## 2024-02-07 NOTE — Progress Notes (Signed)
 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   Appears he recently re-established with cardiology in September 2025. Reportedly having exertional angina worsening since April. He was scheduled for cardiac cath which was completed on 01/21/24 showing stenosis: diag 90%, prox RCA 90, mid RCA 100, prox cx 99%, mid LM to distal LM 70%. Echo same day showed EF 45-50%, RWMA, G1DD, trivial MR. He was referred to [facility] who planned for CABG.   Pump time: 2h 18m Xclamp time: 1h 11m EBL: 990 Products: none Cell saver: 510  Pertinent  Medical History  arthritis, depression, hypertension, hyperlipidemia, type 2 diabetes, CAD, MI x2 s/p 3 RCA stents now occluded, LAD, PCI, HFmrEF  Significant Hospital Events: Including procedures, antibiotic start and stop dates in addition to other pertinent events   10/15: admit to CCU s/p CABG x 3; protamine reaction requiring initiation of epinephrine  .10/16: Came off of vasopressor support, started on low-dose amlodipine  for radial artery graft  Interim History / Subjective:  Patient is complaining of 3/10 surgical site pain  Denies cough or shortness of breath  No overnight issues, remained afebrile  Objective   Blood pressure 107/61, pulse 73, temperature (!) 97.5 F (36.4 C), temperature source Oral, resp. rate (!) 22, height 5' 7 (1.702 m), weight 84.5 kg, SpO2 100%. CVP:  [3 mmHg-10 mmHg] 7 mmHg CO:  [6.4 L/min-8.3 L/min] 7.5 L/min CI:  [3.4 L/min/m2-4.3 L/min/m2] 3.9 L/min/m2      Intake/Output Summary (Last 24 hours) at 02/07/2024 0745 Last data filed at 02/07/2024 0700 Gross per 24 hour   Intake 1948.35 ml  Output 2070 ml  Net -121.65 ml   Filed Weights   02/05/24 0707 02/06/24 0549 02/07/24 0500  Weight: 79.4 kg 85.8 kg 84.5 kg    Examination: General: Elderly male, sitting on recliner HEENT: [place]/AT, eyes anicteric.  moist mucus membranes Neuro: Alert, awake following commands, moving all 4 extremities Chest: Central sternotomy incision looks clean and dry, coarse breath sounds, no wheezes or rhonchi.  Pacer wire, mediastinal and chest tube in place, draining serosanguineous fluid Heart: Regular rate and rhythm, no murmurs or gallops Abdomen: Soft, nontender, nondistended, bowel sounds present  Labs and images reviewed  Patient Lines/Drains/Airways Status     Active Line/Drains/Airways     Name Placement date Placement time Site Days   Peripheral IV 02/05/24 16 G Right Forearm 02/05/24  0740  Forearm  2   CVC Triple Lumen 02/05/24 Right Internal jugular 02/05/24  1700  -- 2   Urethral Catheter M. Rebecka, RN Latex;Temperature probe 16 Fr. 02/05/24  0909  Latex;Temperature probe  2   Y Chest Tube 1, 2, and 3 1 Left;Anterior Pleural 28 Fr. 2 Anterior;Medial Mediastinal 28 Fr. 3 Anterior;Medial Mediastinal 36 Fr. 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 3 with radial artery harvest Continue aspirin  and statin Continue low-dose metoprolol Continue amlodipine  2.5 mg for radial artery harvest Chest tube management [facility] Chest tube output was 610 cc in last 24 hours Continue multimodality pain management with tramadol, oxycodone  and morphine  Add gabapentin 100 mg every 12 hours Closely monitor chest  tube output  Anaphylactic shock due to protamine, resolved Patient did not develop rash His blood pressure has improved Came off of vasopressor support  Acute respiratory insufficiency, postop, improving Currently on nasal cannula oxygen Encourage incentive spirometry and ambulation, titrate oxygen with O2 sat goal 92%  Hyperlipidemia Continue atorvastatin   Diabetes type 2 Patient hemoglobin A1c is 8 Continue Lantus  and sliding scale insulin  with CBG goal 140-180  Expected perioperative blood loss anemia Thrombocytopenia due to CPB H&H and platelet counts are stable Monitor H/H and PLT counts  Hypokalemia/hypocalcemia, resolved Closely monitor electrolytes  Depression  Continue Paxil  40mg  daily   BPH  Continue terazosin  2mg  daily   Labs   CBC: Recent Labs  Lab 02/05/24 1607 02/05/24 2058 02/05/24 2159 02/05/24 2200 02/06/24 0500 02/06/24 1628 02/07/24 0445  WBC 13.5*  --   --  12.4* 10.4 10.1 8.6  HGB 12.4*   < > 9.5* 10.9* 9.9* 10.3* 9.6*  HCT 35.7*   < > 28.0* 31.8* 29.5* 30.8* 28.5*  MCV 84.8  --   --  87.1 87.8 89.0 88.2  PLT 172  --   --  159 149* 131* 124*   < > = values in this interval not displayed.    Basic Metabolic Panel: Recent Labs  Lab 02/05/24 1714 02/05/24 2058 02/05/24 2159 02/05/24 2200 02/06/24 0500 02/06/24 1628 02/07/24 0605  NA 138   < > 140 137 137 136 137  K 3.1*   < > 3.8 3.6 3.5 4.1 4.1  CL 112*  --   --  110 111 109 105  CO2 19*  --   --  20* 18* 20* 23  GLUCOSE 131*  --   --  165* 149* 209* 171*  BUN 15  --   --  16 14 16 21   CREATININE 0.68  --   --  0.83 0.87 0.92 1.00  CALCIUM  6.7*  --   --  7.1* 6.9* 7.5* 8.2*  MG  --   --   --   --  1.9 2.2  --    < > = values in this interval not displayed.   GFR: Estimated Creatinine Clearance: 76.6 mL/min (by C-G formula based on SCr of 1 mg/dL). Recent Labs  Lab 02/05/24 2200 02/06/24 0500 02/06/24 1628 02/07/24 0445  WBC 12.4* 10.4 10.1 8.6    Liver Function  Tests: Recent Labs  Lab 02/03/24 1200  AST 23  ALT 24  ALKPHOS 67  BILITOT 0.9  PROT 7.2  ALBUMIN 4.5   No results for input(s): LIPASE, AMYLASE in the last 168 hours. No results for input(s): AMMONIA in the last 168 hours.  ABG    Component Value Date/Time   PHART 7.307 (L) 02/05/2024 2159   PCO2ART 43.8 02/05/2024 2159   PO2ART 96 02/05/2024 2159   HCO3 21.9 02/05/2024 2159   TCO2 23 02/05/2024 2159   ACIDBASEDEF 4.0 (H) 02/05/2024 2159   O2SAT 97 02/05/2024 2159     Coagulation Profile: Recent Labs  Lab 02/03/24 1200 02/05/24 1607  INR 0.9 1.4*  Cardiac Enzymes: No results for input(s): CKTOTAL, CKMB, CKMBINDEX, TROPONINI in the last 168 hours.  HbA1C: Hgb A1c MFr Bld  Date/Time Value Ref Range Status  02/03/2024 12:00 PM 8.0 (H) 4.8 - 5.6 % Final    Comment:    (NOTE) Diagnosis of Diabetes The following HbA1c ranges recommended by the American Diabetes Association (ADA) may be used as an aid in the diagnosis of diabetes mellitus.  Hemoglobin             Suggested A1C NGSP%              Diagnosis  <5.7                   Non Diabetic  5.7-6.4                Pre-Diabetic  >6.4                   Diabetic  <7.0                   Glycemic control for                       adults with diabetes.    09/23/2023 11:24 AM 8.0 (H) 4.6 - 6.5 % Final    Comment:    Glycemic Control Guidelines for People with Diabetes:Non Diabetic:  <6%Goal of Therapy: <7%Additional Action Suggested:  >8%     CBG: Recent Labs  Lab 02/06/24 1653 02/06/24 1658 02/06/24 1953 02/06/24 2337 02/07/24 0513  GLUCAP 218* 166* 196* 181* 176*      Valinda Novas, MD [facility] See Amion for pager If no response to pager, please call [phone] until 7pm After 7pm, Please call E-link [phone]

## 2024-02-07 NOTE — Progress Notes (Addendum)
 [facility] DAILY ICU PROGRESS NOTE                   [address]            [place] [address]          [phone]   2 Days Post-Op Procedure(s) (LRB): CORONARY ARTERY BYPASS GRAFTING (CABG) X THREE USING LEFT INTERNAL MAMMARY ARTERY, RIGHT LEG GREATER SAPHENOUS VEIN, LEFT RADIAL ARTERY (N/A) SURGICAL PROCUREMENT, ARTERY, RADIAL (Left) ECHOCARDIOGRAM, TRANSESOPHAGEAL (N/A)  Total Length of Stay:  LOS: 2 days   Subjective: Patient sitting in chair. He has a fair amount of incisional/sternal pain. He thinks he needs to have a bowel movement this am.  Objective: Vital signs in last 24 hours: Temp:  [97.5 F (36.4 C)-98.7 F (37.1 C)] 98.7 F (37.1 C) (10/17 0802) Pulse Rate:  [71-104] 73 (10/17 0700) Cardiac Rhythm: Normal sinus rhythm (10/17 0400) Resp:  [14-36] 22 (10/17 0700) BP: (88-135)/(55-73) 107/61 (10/17 0700) SpO2:  [81 %-100 %] 100 % (10/17 0700) Arterial Line BP: (88-169)/(29-66) 139/61 (10/16 1400) Weight:  [84.5 kg] 84.5 kg (10/17 0500)  Filed Weights   02/05/24 0707 02/06/24 0549 02/07/24 0500  Weight: 79.4 kg 85.8 kg 84.5 kg    Weight change: 5.121 kg      Intake/Output from previous day: 10/16 0701 - 10/17 0700 In: 1948.4 [P.O.:1320; I.V.:193.5; IV Piggyback:434.8] Out: 2070 [Urine:1460; Chest Tube:610]  Intake/Output this shift: No intake/output data recorded.  Current Meds: Scheduled Meds:  acetaminophen   1,000 mg Oral Q6H   Or   acetaminophen  (TYLENOL ) oral liquid 160 mg/5 mL  1,000 mg Per Tube Q6H   amLODipine   2.5 mg Oral Daily   aspirin  EC  325 mg Oral Daily   Or   aspirin   324 mg Per Tube Daily   atorvastatin   80 mg Oral Daily   bisacodyl  10 mg Oral Daily   Or   bisacodyl  10 mg Rectal Daily   Chlorhexidine  Gluconate Cloth  6 each Topical Daily   docusate sodium  200 mg Oral Daily   furosemide   40 mg Intravenous Once   gabapentin  100 mg Oral BID   insulin  aspart  0-24 Units Subcutaneous TID WC   insulin   glargine-yfgn  12 Units Subcutaneous Daily   metoprolol tartrate  12.5 mg Oral BID   Or   metoprolol tartrate  12.5 mg Per Tube BID   pantoprazole  40 mg Oral Daily   PARoxetine   40 mg Oral Daily   sodium chloride  flush  3 mL Intravenous Q12H   terazosin   2 mg Oral QHS   Continuous Infusions:   ceFAZolin (ANCEF) IV Stopped (02/07/24 0553)   PRN Meds:.dextrose, metoprolol tartrate, morphine  injection, ondansetron  (ZOFRAN ) IV, mouth rinse, oxyCODONE , sodium chloride  flush, traMADol  General appearance: alert, cooperative, and no distress Neurologic: intact Heart: RRR Lungs: Diminished bibasilar breath sounds Abdomen: Soft, non tender, bowel sounds present Extremities: Mild LE edema. Motor/sensory intact LUE Wounds: Sternal dressing is clean and dry. LUE and RLE wounds are mostly clean and dry.  Lab Results: CBC: Recent Labs    02/06/24 1628 02/07/24 0445  WBC 10.1 8.6  HGB 10.3* 9.6*  HCT 30.8* 28.5*  PLT 131* 124*   BMET:  Recent Labs    02/06/24 1628 02/07/24 0605  NA 136 137  K 4.1 4.1  CL 109 105  CO2 20* 23  GLUCOSE 209* 171*  BUN 16 21  CREATININE 0.92 1.00  CALCIUM  7.5*  8.2*    CMET: Lab Results  Component Value Date   WBC 8.6 02/07/2024   HGB 9.6 (L) 02/07/2024   HCT 28.5 (L) 02/07/2024   PLT 124 (L) 02/07/2024   GLUCOSE 171 (H) 02/07/2024   CHOL 137 09/23/2023   TRIG 63 02/06/2024   HDL 41.30 09/23/2023   LDLDIRECT 138.0 04/18/2021   LDLCALC 70 09/23/2023   ALT 24 02/03/2024   AST 23 02/03/2024   NA 137 02/07/2024   K 4.1 02/07/2024   CL 105 02/07/2024   CREATININE 1.00 02/07/2024   BUN 21 02/07/2024   CO2 23 02/07/2024   TSH 3.08 09/23/2023   PSA 2.55 09/23/2023   INR 1.4 (H) 02/05/2024   HGBA1C 8.0 (H) 02/03/2024   MICROALBUR 1.6 09/23/2023      PT/INR:  Recent Labs    02/05/24 1607  LABPROT 18.1*  INR 1.4*   Radiology: [facility] Chest Port 1 View Result Date: 02/07/2024 CLINICAL DATA:  Status post cardiac surgery. EXAM: PORTABLE  CHEST 1 VIEW COMPARISON:  02/06/2024 FINDINGS: Low volume film. Right IJ central line remains in place. Mediastinal/pericardial drains persist. Similar streaky basilar opacities compatible with atelectasis and probable tiny bilateral pleural effusions. No overt pulmonary edema. Telemetry leads overlie the chest. IMPRESSION: Low volume film with bibasilar atelectasis and probable tiny bilateral pleural effusions. Electronically Signed   By: Camellia Candle M.D.   On: 02/07/2024 07:24     Assessment/Plan: S/P Procedure(s) (LRB): CORONARY ARTERY BYPASS GRAFTING (CABG) X THREE USING LEFT INTERNAL MAMMARY ARTERY, RIGHT LEG GREATER SAPHENOUS VEIN, LEFT RADIAL ARTERY (N/A) SURGICAL PROCUREMENT, ARTERY, RADIAL (Left) ECHOCARDIOGRAM, TRANSESOPHAGEAL (N/A) CV-SR. On Lopressor 12.5 mg bid and Amlodipine  2.5 mg daily (radial artery conduit). Will increase Lopressor to 25 mg bid. He was on Coreg  prior to surgery. Pulmonary-on 2 L of oxygen via [place]. Chest tube with 610 cc last 24 hours. CXR this am shows low lung volumes, small bilateral effusions and atelectasis. Chest tubes to remain for now. Encourage incentive spirometer. Above pre op weight-give Lasix  IV this am Expected post op blood loss anemia-H and H this am slightly decreased to 9.6 and 28.5 DM-CBGs 181/176/287. On Insulin  but will increase for better glucose control. Will restart Metformin  closer to discharge. Pre op HGA1C 8. He will need close medical follow up after discharge. 6. History of hyperlipidemia-continue Atorvastatin  80 mg daily 7. History of BPH-on Terazosin  8. Mild thrombocytopenia-platelets this am 124,000 9. Regarding pain control, on Oxy Q 3 PRN and Gabapentin 100 mg bid added this am. I will also add Lidocaine  patch and muscle relaxer (Robaxin) PRN. 10. Remove foley;likely will remove EPW and right internal jugular in am  Kyla CHRISTELLA Donald PA-C 02/07/2024 8:29 AM  Doing well SS CT output.  Continue diuresis Will remove wires  and keep tubes ICU for another day  Raylin Winer O Henrietta Cieslewicz

## 2024-02-08 ENCOUNTER — Inpatient Hospital Stay (HOSPITAL_COMMUNITY)

## 2024-02-08 DIAGNOSIS — Z951 Presence of aortocoronary bypass graft: Secondary | ICD-10-CM | POA: Diagnosis not present

## 2024-02-08 DIAGNOSIS — Z87891 Personal history of nicotine dependence: Secondary | ICD-10-CM | POA: Diagnosis not present

## 2024-02-08 DIAGNOSIS — T782XXA Anaphylactic shock, unspecified, initial encounter: Secondary | ICD-10-CM | POA: Diagnosis not present

## 2024-02-08 DIAGNOSIS — I25110 Atherosclerotic heart disease of native coronary artery with unstable angina pectoris: Secondary | ICD-10-CM | POA: Diagnosis not present

## 2024-02-08 LAB — BASIC METABOLIC PANEL WITH GFR
Anion gap: 8 (ref 5–15)
BUN: 27 mg/dL — ABNORMAL HIGH (ref 8–23)
CO2: 24 mmol/L (ref 22–32)
Calcium: 7.9 mg/dL — ABNORMAL LOW (ref 8.9–10.3)
Chloride: 104 mmol/L (ref 98–111)
Creatinine, Ser: 1 mg/dL (ref 0.61–1.24)
GFR, Estimated: >60 mL/min (ref >60)
Glucose, Bld: 135 mg/dL — ABNORMAL HIGH (ref 70–99)
Potassium: 3.5 mmol/L (ref 3.5–5.1)
Sodium: 136 mmol/L (ref 135–145)

## 2024-02-08 LAB — CBC WITH DIFFERENTIAL/PLATELET
Abs Immature Granulocytes: 0.03 K/uL (ref 0.00–0.07)
Basophils Absolute: 0 K/uL (ref 0.0–0.1)
Basophils Relative: 0 %
Eosinophils Absolute: 0 K/uL (ref 0.0–0.5)
Eosinophils Relative: 0 %
HCT: 27.8 % — ABNORMAL LOW (ref 39.0–52.0)
Hemoglobin: 9.2 g/dL — ABNORMAL LOW (ref 13.0–17.0)
Immature Granulocytes: 0 %
Lymphocytes Relative: 16 %
Lymphs Abs: 1.3 K/uL (ref 0.7–4.0)
MCH: 29.5 pg (ref 26.0–34.0)
MCHC: 33.1 g/dL (ref 30.0–36.0)
MCV: 89.1 fL (ref 80.0–100.0)
Monocytes Absolute: 0.9 K/uL (ref 0.1–1.0)
Monocytes Relative: 11 %
Neutro Abs: 6.3 K/uL (ref 1.7–7.7)
Neutrophils Relative %: 73 %
Platelets: 132 K/uL — ABNORMAL LOW (ref 150–400)
RBC: 3.12 MIL/uL — ABNORMAL LOW (ref 4.22–5.81)
RDW: 13.5 % (ref 11.5–15.5)
WBC: 8.6 K/uL (ref 4.0–10.5)
nRBC: 0 % (ref 0.0–0.2)

## 2024-02-08 LAB — GLUCOSE, CAPILLARY
Glucose-Capillary: 134 mg/dL — ABNORMAL HIGH (ref 70–99)
Glucose-Capillary: 247 mg/dL — ABNORMAL HIGH (ref 70–99)
Glucose-Capillary: 190 mg/dL — ABNORMAL HIGH (ref 70–99)
Glucose-Capillary: 140 mg/dL — ABNORMAL HIGH (ref 70–99)

## 2024-02-08 MED ORDER — SODIUM CHLORIDE 0.9% FLUSH: 3.0000 mL | INTRAVENOUS | Status: DC | PRN

## 2024-02-08 MED ORDER — SODIUM CHLORIDE 0.9% FLUSH
3.0000 mL | Freq: Two times a day (BID) | INTRAVENOUS | Status: DC
  Administered 2024-02-08 – 2024-02-10 (×3): 3 mL via INTRAVENOUS

## 2024-02-08 MED ORDER — ~~LOC~~ CARDIAC SURGERY, PATIENT & FAMILY EDUCATION
Freq: Once | Status: AC
  Administered 2024-02-08: 1

## 2024-02-08 MED ORDER — SODIUM CHLORIDE 0.9 % IV SOLN: 250.0000 mL | INTRAVENOUS | Status: AC | PRN

## 2024-02-08 MED ORDER — FENTANYL CITRATE (PF) 50 MCG/ML IJ SOSY: 25.0000 ug | PREFILLED_SYRINGE | Freq: Once | INTRAMUSCULAR | Status: DC

## 2024-02-08 MED ORDER — GABAPENTIN 100 MG PO CAPS
100.0000 mg | ORAL_CAPSULE | Freq: Two times a day (BID) | ORAL | Status: AC
  Administered 2024-02-08 – 2024-02-09 (×4): 100 mg via ORAL
  Filled 2024-02-08 (×4): qty 1

## 2024-02-08 MED ORDER — POTASSIUM CHLORIDE CRYS ER 20 MEQ PO TBCR
20.0000 meq | EXTENDED_RELEASE_TABLET | ORAL | Status: AC
  Administered 2024-02-08 (×3): 20 meq via ORAL
  Filled 2024-02-08 (×3): qty 1

## 2024-02-08 MED ORDER — INSULIN ASPART 100 UNIT/ML IJ SOLN
0.0000 [IU] | Freq: Three times a day (TID) | INTRAMUSCULAR | Status: DC
  Administered 2024-02-08: 8 [IU] via SUBCUTANEOUS
  Administered 2024-02-08: 4 [IU] via SUBCUTANEOUS
  Administered 2024-02-08: 2 [IU] via SUBCUTANEOUS
  Administered 2024-02-09 (×2): 8 [IU] via SUBCUTANEOUS
  Administered 2024-02-09 (×2): 2 [IU] via SUBCUTANEOUS

## 2024-02-08 NOTE — Progress Notes (Signed)
 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   Appears he recently re-established with cardiology in September 2025. Reportedly having exertional angina worsening since April. He was scheduled for cardiac cath which was completed on 01/21/24 showing stenosis: diag 90%, prox RCA 90, mid RCA 100, prox cx 99%, mid LM to distal LM 70%. Echo same day showed EF 45-50%, RWMA, G1DD, trivial MR. He was referred to [facility] who planned for CABG.   Pump time: 2h 64m Xclamp time: 1h 56m EBL: 990 Products: none Cell saver: 510  Pertinent  Medical History  arthritis, depression, hypertension, hyperlipidemia, type 2 diabetes, CAD, MI x2 s/p 3 RCA stents now occluded, LAD, PCI, HFmrEF  Significant Hospital Events: Including procedures, antibiotic start and stop dates in addition to other pertinent events   10/15: admit to CCU s/p CABG x 3; protamine reaction requiring initiation of epinephrine  10/16: Came off of vasopressor support, started on low-dose amlodipine  for radial artery graft 10/17 continue to complain of surgical site pain, denies nausea, vomiting, fever or chills.  Chest tube output was 16 cc in last 24 hours  Interim History / Subjective:  No overnight issues, patient stated feeling much better Still no bowel movement but he feels like going to bathroom now Denies nausea and vomiting, stated appetite is better  Objective   Blood pressure 116/72, pulse 81, temperature 98.3 F (36.8 C), temperature source Oral, resp. rate (!) 25, height 5' 7 (1.702 m), weight 83.8 kg, SpO2 (!) 82%.        Intake/Output Summary (Last 24 hours) at  02/08/2024 0734 Last data filed at 02/08/2024 0530 Gross per 24 hour  Intake 700 ml  Output 1420 ml  Net -720 ml   Filed Weights   02/06/24 0549 02/07/24 0500 02/08/24 0500  Weight: 85.8 kg 84.5 kg 83.8 kg    Examination: General: Elderly male sitting on recliner HEENT: [place]/AT, eyes anicteric.  moist mucus membranes Neuro: Alert, awake following commands Chest: Central sternotomy incision looks clean and dry, coarse breath sounds, no wheezes or rhonchi.  Mediastinal and chest tube in place Heart: Regular rate and rhythm, no murmurs or gallops Abdomen: Soft, nontender, nondistended, bowel sounds present  Labs and x-ray chest reviewed  Patient Lines/Drains/Airways Status     Active Line/Drains/Airways     Name Placement date Placement time Site Days   Peripheral IV 02/05/24 16 G Right Forearm 02/05/24  0740  Forearm  3   CVC Triple Lumen 02/05/24 Right Internal jugular 02/05/24  1700  -- 3   Y Chest Tube 1, 2, and 3 1 Left;Anterior Pleural 28 Fr. 2 Anterior;Medial Mediastinal 28 Fr. 3 Anterior;Medial Mediastinal 36 Fr. 02/05/24  --  -- 3   Wound 02/05/24 1225 Surgical Closed Surgical Incision Arm Left 02/05/24  1225  Arm  3   Wound 02/05/24 1335 Surgical Closed Surgical Incision Chest Other (Comment) 02/05/24  1335  Chest  3   Wound 02/05/24 1438 Surgical Closed Surgical Incision Leg Right 02/05/24  1438  Leg  3         Resolved Hospital Problem list   Anaphylactic shock due to protamine Hypokalemia/hypocalcemia  Assessment & Plan:  Multivessel coronary artery disease s/p CABG x 3 with radial artery harvest Continue aspirin  and statin Continue metoprolol 25 mg twice daily and amlodipine  2.5 mg for radial artery harvest Chest tube management [facility] Chest tube output was 170 cc in last 24 hours Continue multimodality pain management with tramadol, oxycodone  and morphine  Continue gabapentin 100 mg twice daily, with stop date by tomorrow Closely monitor chest tube  output  Acute respiratory insufficiency, postop, improving Patient is on 1 L nasal cannula oxygen Encourage incentive spirometry and ambulation, try to taper off oxygen with O2 sats goal 92%  Hyperlipidemia Continue atorvastatin   Diabetes type 2 Patient hemoglobin A1c is 8 Blood sugars are controlled Continue Lantus  16 units daily and sliding scale insulin  with CBG goal 140-180  Expected perioperative blood loss anemia Thrombocytopenia due to CPB H&H and platelet counts are stable 9 and 130s respectively Monitor H/H and PLT counts  Depression  Continue Paxil  40mg  daily   BPH  Continue terazosin  2mg  daily  Foley was removed yesterday, patient had a In-N-Out cath overnight, closely monitor  Labs   CBC: Recent Labs  Lab 02/05/24 2200 02/06/24 0500 02/06/24 1628 02/07/24 0445 02/08/24 0431  WBC 12.4* 10.4 10.1 8.6 8.6  NEUTROABS  --   --   --   --  6.3  HGB 10.9* 9.9* 10.3* 9.6* 9.2*  HCT 31.8* 29.5* 30.8* 28.5* 27.8*  MCV 87.1 87.8 89.0 88.2 89.1  PLT 159 149* 131* 124* 132*    Basic Metabolic Panel: Recent Labs  Lab 02/05/24 2200 02/06/24 0500 02/06/24 1628 02/07/24 0605 02/08/24 0431  NA 137 137 136 137 136  K 3.6 3.5 4.1 4.1 3.5  CL 110 111 109 105 104  CO2 20* 18* 20* 23 24  GLUCOSE 165* 149* 209* 171* 135*  BUN 16 14 16 21  27*  CREATININE 0.83 0.87 0.92 1.00 1.00  CALCIUM  7.1* 6.9* 7.5* 8.2* 7.9*  MG  --  1.9 2.2  --   --    GFR: Estimated Creatinine Clearance: 76.3 mL/min (by C-G formula based on SCr of 1 mg/dL). Recent Labs  Lab 02/06/24 0500 02/06/24 1628 02/07/24 0445 02/08/24 0431  WBC 10.4 10.1 8.6 8.6    Liver Function Tests: Recent Labs  Lab 02/03/24 1200  AST 23  ALT 24  ALKPHOS 67  BILITOT 0.9  PROT 7.2  ALBUMIN 4.5   No results for input(s): LIPASE, AMYLASE in the last 168 hours. No results for input(s): AMMONIA in the last 168 hours.  ABG    Component Value Date/Time   PHART 7.307 (L) 02/05/2024 2159    PCO2ART 43.8 02/05/2024 2159   PO2ART 96 02/05/2024 2159   HCO3 21.9 02/05/2024 2159   TCO2 23 02/05/2024 2159   ACIDBASEDEF 4.0 (H) 02/05/2024 2159   O2SAT 97 02/05/2024 2159     Coagulation Profile: Recent Labs  Lab 02/03/24 1200 02/05/24 1607  INR 0.9 1.4*    Cardiac Enzymes: No results for input(s): CKTOTAL, CKMB, CKMBINDEX, TROPONINI in the last 168 hours.  HbA1C: Hgb A1c MFr Bld  Date/Time Value Ref Range Status  02/03/2024 12:00 PM 8.0 (H) 4.8 - 5.6 % Final    Comment:    (NOTE) Diagnosis of Diabetes The following HbA1c ranges recommended by the American Diabetes Association (ADA) may be used as an aid in the diagnosis of diabetes mellitus.  Hemoglobin             Suggested A1C NGSP%  Diagnosis  <5.7                   Non Diabetic  5.7-6.4                Pre-Diabetic  >6.4                   Diabetic  <7.0                   Glycemic control for                       adults with diabetes.    09/23/2023 11:24 AM 8.0 (H) 4.6 - 6.5 % Final    Comment:    Glycemic Control Guidelines for People with Diabetes:Non Diabetic:  <6%Goal of Therapy: <7%Additional Action Suggested:  >8%     CBG: Recent Labs  Lab 02/07/24 0803 02/07/24 1143 02/07/24 1625 02/07/24 2205 02/08/24 0646  GLUCAP 287* 208* 118* 160* 134*      Valinda Novas, MD [facility] See Amion for pager If no response to pager, please call [phone] until 7pm After 7pm, Please call E-link [phone]

## 2024-02-08 NOTE — Progress Notes (Signed)
      [address]       [place] [address]             [phone]                 3 Days Post-Op Procedure(s) (LRB): CORONARY ARTERY BYPASS GRAFTING (CABG) X THREE USING LEFT INTERNAL MAMMARY ARTERY, RIGHT LEG GREATER SAPHENOUS VEIN, LEFT RADIAL ARTERY (N/A) SURGICAL PROCUREMENT, ARTERY, RADIAL (Left) ECHOCARDIOGRAM, TRANSESOPHAGEAL (N/A)   Events: No events _______________________________________________________________ Vitals: BP 138/71   Pulse 73   Temp 98.6 F (37 C) (Oral)   Resp (!) 25   Ht 5' 7 (1.702 m)   Wt 83.8 kg   SpO2 91%   BMI 28.94 kg/m  Filed Weights   02/06/24 0549 02/07/24 0500 02/08/24 0500  Weight: 85.8 kg 84.5 kg 83.8 kg     - Neuro: alert NAd  - Cardiovascular: sinus  Drips: none.      - Pulm: EWOB    ABG    Component Value Date/Time   PHART 7.307 (L) 02/05/2024 2159   PCO2ART 43.8 02/05/2024 2159   PO2ART 96 02/05/2024 2159   HCO3 21.9 02/05/2024 2159   TCO2 23 02/05/2024 2159   ACIDBASEDEF 4.0 (H) 02/05/2024 2159   O2SAT 97 02/05/2024 2159    - Abd: ND - Extremity: trace  .Intake/Output      10/17 0701 10/18 0700 10/18 0701 10/19 0700   P.O. 600 240   I.V. (mL/kg)     IV Piggyback 100    Total Intake(mL/kg) 700 (8.4) 240 (2.9)   Urine (mL/kg/hr) 1250 (0.6) 0 (0)   Stool  0   Chest Tube 170 130   Total Output 1420 130   Net -720 +110        Urine Occurrence  1 x   Stool Occurrence  1 x      _______________________________________________________________ Labs:    Latest Ref Rng & Units 02/08/2024    4:31 AM 02/07/2024    4:45 AM 02/06/2024    4:28 PM  CBC  WBC 4.0 - 10.5 K/uL 8.6  8.6  10.1   Hemoglobin 13.0 - 17.0 g/dL 9.2  9.6  89.6   Hematocrit 39.0 - 52.0 % 27.8  28.5  30.8   Platelets 150 - 400 K/uL 132  124  131       Latest Ref Rng & Units 02/08/2024    4:31 AM 02/07/2024    6:05 AM 02/06/2024    4:28 PM  CMP  Glucose 70 - 99 mg/dL 864  828  790   BUN 8 - 23 mg/dL 27  21   16    Creatinine 0.61 - 1.24 mg/dL 8.99  8.99  9.07   Sodium 135 - 145 mmol/L 136  137  136   Potassium 3.5 - 5.1 mmol/L 3.5  4.1  4.1   Chloride 98 - 111 mmol/L 104  105  109   CO2 22 - 32 mmol/L 24  23  20    Calcium  8.9 - 10.3 mg/dL 7.9  8.2  7.5     CXR: stable  _______________________________________________________________  Assessment and Plan: POD 3 s/p CABG  Neuro: pain controlled CV: stable Pulm: IS, ambulation Renal: creat stable GI: on diet Heme: stable ID: [email or web address] Endo: SSI Dispo: floor   Justin Galvan O Justin Galvan 02/08/2024 11:09 AM

## 2024-02-09 LAB — BASIC METABOLIC PANEL WITH GFR
Anion gap: 9 (ref 5–15)
BUN: 21 mg/dL (ref 8–23)
CO2: 24 mmol/L (ref 22–32)
Calcium: 8.1 mg/dL — ABNORMAL LOW (ref 8.9–10.3)
Chloride: 107 mmol/L (ref 98–111)
Creatinine, Ser: 0.91 mg/dL (ref 0.61–1.24)
GFR, Estimated: >60 mL/min (ref >60)
Glucose, Bld: 115 mg/dL — ABNORMAL HIGH (ref 70–99)
Potassium: 3.9 mmol/L (ref 3.5–5.1)
Sodium: 140 mmol/L (ref 135–145)

## 2024-02-09 LAB — GLUCOSE, CAPILLARY
Glucose-Capillary: 141 mg/dL — ABNORMAL HIGH (ref 70–99)
Glucose-Capillary: 223 mg/dL — ABNORMAL HIGH (ref 70–99)
Glucose-Capillary: 237 mg/dL — ABNORMAL HIGH (ref 70–99)
Glucose-Capillary: 158 mg/dL — ABNORMAL HIGH (ref 70–99)

## 2024-02-09 LAB — CBC
HCT: 28.1 % — ABNORMAL LOW (ref 39.0–52.0)
Hemoglobin: 9.4 g/dL — ABNORMAL LOW (ref 13.0–17.0)
MCH: 29.6 pg (ref 26.0–34.0)
MCHC: 33.5 g/dL (ref 30.0–36.0)
MCV: 88.4 fL (ref 80.0–100.0)
Platelets: 184 K/uL (ref 150–400)
RBC: 3.18 MIL/uL — ABNORMAL LOW (ref 4.22–5.81)
RDW: 13.2 % (ref 11.5–15.5)
WBC: 8.3 K/uL (ref 4.0–10.5)
nRBC: 0 % (ref 0.0–0.2)

## 2024-02-09 MED ORDER — POTASSIUM CHLORIDE CRYS ER 20 MEQ PO TBCR: 20.0000 meq | EXTENDED_RELEASE_TABLET | Freq: Every day | ORAL | Status: DC

## 2024-02-09 MED ORDER — POTASSIUM CHLORIDE CRYS ER 20 MEQ PO TBCR
20.0000 meq | EXTENDED_RELEASE_TABLET | Freq: Two times a day (BID) | ORAL | Status: DC
  Administered 2024-02-09 – 2024-02-10 (×3): 20 meq via ORAL
  Filled 2024-02-09 (×3): qty 1

## 2024-02-09 MED ORDER — FUROSEMIDE 40 MG PO TABS
40.0000 mg | ORAL_TABLET | Freq: Every day | ORAL | Status: DC
  Administered 2024-02-09 – 2024-02-10 (×2): 40 mg via ORAL
  Filled 2024-02-09 (×2): qty 1

## 2024-02-09 MED ORDER — METFORMIN HCL ER 750 MG PO TB24
750.0000 mg | ORAL_TABLET | Freq: Every day | ORAL | Status: DC
Start: 2024-02-10 — End: 2024-02-10
  Administered 2024-02-10: 750 mg via ORAL
  Filled 2024-02-09: qty 1

## 2024-02-09 NOTE — Plan of Care (Signed)

## 2024-02-09 NOTE — Progress Notes (Signed)
 Pt ambulated x 470 in hall independently, pt tolerated well

## 2024-02-09 NOTE — Progress Notes (Addendum)
 4 Days Post-Op Procedure(s) (LRB): CORONARY ARTERY BYPASS GRAFTING (CABG) X THREE USING LEFT INTERNAL MAMMARY ARTERY, RIGHT LEG GREATER SAPHENOUS VEIN, LEFT RADIAL ARTERY (N/A) SURGICAL PROCUREMENT, ARTERY, RADIAL (Left) ECHOCARDIOGRAM, TRANSESOPHAGEAL (N/A) Subjective: Feels pretty well, no specific c/o  Objective: Vital signs in last 24 hours: Temp:  [98 F (36.7 C)-99.3 F (37.4 C)] 98.1 F (36.7 C) (10/19 0523) Pulse Rate:  [66-80] 66 (10/19 0523) Cardiac Rhythm: Normal sinus rhythm (10/18 1900) Resp:  [16-29] 18 (10/19 0523) BP: (105-183)/(54-82) 145/78 (10/19 0523) SpO2:  [89 %-96 %] 96 % (10/19 0523) Weight:  [82.7 kg] 82.7 kg (10/19 0537)  Hemodynamic parameters for last 24 hours:    Intake/Output from previous day: 10/18 0701 - 10/19 0700 In: 600 [P.O.:600] Out: 930 [Urine:800; Chest Tube:130] Intake/Output this shift: No intake/output data recorded.  General appearance: alert, cooperative, and no distress Heart: regular rate and rhythm Lungs: clear to auscultation bilaterally Abdomen: benign Extremities: no edema Wound: incis healing well, left hand n/v intact  Lab Results: Recent Labs    02/08/24 0431 02/09/24 0346  WBC 8.6 8.3  HGB 9.2* 9.4*  HCT 27.8* 28.1*  PLT 132* 184   BMET:  Recent Labs    02/08/24 0431 02/09/24 0346  NA 136 140  K 3.5 3.9  CL 104 107  CO2 24 24  GLUCOSE 135* 115*  BUN 27* 21  CREATININE 1.00 0.91  CALCIUM  7.9* 8.1*    PT/INR: No results for input(s): LABPROT, INR in the last 72 hours. ABG    Component Value Date/Time   PHART 7.307 (L) 02/05/2024 2159   HCO3 21.9 02/05/2024 2159   TCO2 23 02/05/2024 2159   ACIDBASEDEF 4.0 (H) 02/05/2024 2159   O2SAT 97 02/05/2024 2159   CBG (last 3)  Recent Labs    02/08/24 1603 02/08/24 2241 02/09/24 0612  GLUCAP 190* 140* 141*    Meds Scheduled Meds:  acetaminophen   1,000 mg Oral Q6H   Or   acetaminophen  (TYLENOL ) oral liquid 160 mg/5 mL  1,000 mg Per Tube Q6H    amLODipine   2.5 mg Oral Daily   aspirin  EC  325 mg Oral Daily   Or   aspirin   324 mg Per Tube Daily   atorvastatin   80 mg Oral Daily   bisacodyl  10 mg Oral Daily   Or   bisacodyl  10 mg Rectal Daily   docusate sodium  200 mg Oral Daily   fentaNYL  (SUBLIMAZE ) injection  25 mcg Intravenous Once   gabapentin  100 mg Oral BID   heparin  injection (subcutaneous)  5,000 Units Subcutaneous Q8H   insulin  aspart  0-24 Units Subcutaneous TID AC & HS   insulin  glargine-yfgn  16 Units Subcutaneous Daily   lidocaine   1 patch Transdermal Q24H   metoprolol tartrate  25 mg Oral BID   Or   metoprolol tartrate  25 mg Per Tube BID   pantoprazole  40 mg Oral Daily   PARoxetine   40 mg Oral Daily   sodium chloride  flush  3 mL Intravenous Q12H   sodium chloride  flush  3 mL Intravenous Q12H   terazosin   2 mg Oral QHS   Continuous Infusions:  sodium chloride      PRN Meds:.sodium chloride , dextrose, Influenza vac split trivalent PF, methocarbamol, metoprolol tartrate, morphine  injection, ondansetron  (ZOFRAN ) IV, mouth rinse, oxyCODONE , sodium chloride  flush, sodium chloride  flush, traMADol  Xrays DG CHEST PORT 1 VIEW Result Date: 02/08/2024 EXAM: 1 VIEW XRAY OF THE CHEST 02/08/2024 05:42:00 AM COMPARISON: Portable  chest yesterday at 5:41 am. CLINICAL HISTORY: Pleural effusion. FINDINGS: 06:01 AM. LINES, TUBES AND DEVICES: A left chest tube, mediastinal and pericardial drains remain in position. Again noted is a right IJ catheter with introducer sheath terminating in the mid SVC. LUNGS AND PLEURA: Minimal pleural effusions continue to be seen. Atelectatic bands are present in both hypoinflated bases. The remaining lungs are clear. Overall aeration seems unchanged. No significant focal pulmonary opacity. No pulmonary edema. No pneumothorax. HEART AND MEDIASTINUM: CABG changes are stable and the mediastinal configuration unaltered. Stable cardiomegaly. No acute abnormality of the cardiac and mediastinal  silhouettes. BONES AND SOFT TISSUES: No acute osseous abnormality. IMPRESSION: 1. Stable cardiomegaly with unchanged post-CABG postoperative appearance. 2. Minimal pleural effusions with bibasilar atelectatic bands in the setting of low lung volumes, unchanged. Electronically signed by: Francis Quam MD 02/08/2024 07:03 AM EDT RP Workstation: [id number]    Assessment/Plan: S/P Procedure(s) (LRB): CORONARY ARTERY BYPASS GRAFTING (CABG) X THREE USING LEFT INTERNAL MAMMARY ARTERY, RIGHT LEG GREATER SAPHENOUS VEIN, LEFT RADIAL ARTERY (N/A) SURGICAL PROCUREMENT, ARTERY, RADIAL (Left) ECHOCARDIOGRAM, TRANSESOPHAGEAL (N/A) POD#4 CABG  1 afeb, VSS BP mostly well controlled with a couple high/low readings. NSR, PVC's 2 O2 sats good on RA 3 weight approx 3 kg > preop 4 good UOP- not all measured- will add diuretic 5 BS mostly well controlled, on reading >200 yesterday- will resume metformin  will need good longterm outpatient management 6 normal renal fxn 7 expected ABLA stable 8 thrombocytopenia resolved 9 cont pulm hygiene and rehab 10 likely home in am if no new issues   LOS: 4 days    Lemond FORBES Cera PA-C Pager [phone] 02/09/2024  Agree Doing well Dispo planning  Linnie KIDD Enrrique Mierzwa

## 2024-02-10 ENCOUNTER — Inpatient Hospital Stay (HOSPITAL_COMMUNITY)

## 2024-02-10 ENCOUNTER — Other Ambulatory Visit (HOSPITAL_COMMUNITY): Payer: Self-pay

## 2024-02-10 DIAGNOSIS — Z951 Presence of aortocoronary bypass graft: Secondary | ICD-10-CM

## 2024-02-10 LAB — CBC
HCT: 29.4 % — ABNORMAL LOW (ref 39.0–52.0)
Hemoglobin: 9.8 g/dL — ABNORMAL LOW (ref 13.0–17.0)
MCH: 29.3 pg (ref 26.0–34.0)
MCHC: 33.3 g/dL (ref 30.0–36.0)
MCV: 88 fL (ref 80.0–100.0)
Platelets: 217 K/uL (ref 150–400)
RBC: 3.34 MIL/uL — ABNORMAL LOW (ref 4.22–5.81)
RDW: 13.3 % (ref 11.5–15.5)
WBC: 7.7 K/uL (ref 4.0–10.5)
nRBC: 0 % (ref 0.0–0.2)

## 2024-02-10 LAB — BASIC METABOLIC PANEL WITH GFR
Anion gap: 10 (ref 5–15)
BUN: 18 mg/dL (ref 8–23)
CO2: 25 mmol/L (ref 22–32)
Calcium: 8.3 mg/dL — ABNORMAL LOW (ref 8.9–10.3)
Chloride: 107 mmol/L (ref 98–111)
Creatinine, Ser: 0.93 mg/dL (ref 0.61–1.24)
GFR, Estimated: >60 mL/min (ref >60)
Glucose, Bld: 91 mg/dL (ref 70–99)
Potassium: 3.6 mmol/L (ref 3.5–5.1)
Sodium: 142 mmol/L (ref 135–145)

## 2024-02-10 LAB — GLUCOSE, CAPILLARY: Glucose-Capillary: 119 mg/dL — ABNORMAL HIGH (ref 70–99)

## 2024-02-10 MED ORDER — METOPROLOL TARTRATE 25 MG PO TABS
25.0000 mg | ORAL_TABLET | Freq: Two times a day (BID) | ORAL | 2 refills | Status: DC
  Filled 2024-02-10: qty 60, 30d supply, fill #0

## 2024-02-10 MED ORDER — AMLODIPINE BESYLATE 5 MG PO TABS
5.0000 mg | ORAL_TABLET | Freq: Every day | ORAL | Status: DC
  Administered 2024-02-10: 5 mg via ORAL
  Filled 2024-02-10: qty 1

## 2024-02-10 MED ORDER — METHOCARBAMOL 500 MG PO TABS
500.0000 mg | ORAL_TABLET | Freq: Three times a day (TID) | ORAL | 0 refills | Status: AC | PRN
  Filled 2024-02-10: qty 15, 5d supply, fill #0

## 2024-02-10 MED ORDER — POTASSIUM CHLORIDE CRYS ER 20 MEQ PO TBCR
20.0000 meq | EXTENDED_RELEASE_TABLET | Freq: Two times a day (BID) | ORAL | 0 refills | Status: DC
  Filled 2024-02-10: qty 5, 3d supply, fill #0

## 2024-02-10 MED ORDER — ASPIRIN 325 MG PO TBEC
325.0000 mg | DELAYED_RELEASE_TABLET | Freq: Every day | ORAL | 2 refills | Status: AC
  Filled 2024-02-10: qty 100, 100d supply, fill #0

## 2024-02-10 MED ORDER — OXYCODONE HCL 5 MG PO TABS
5.0000 mg | ORAL_TABLET | Freq: Four times a day (QID) | ORAL | 0 refills | Status: AC | PRN
  Filled 2024-02-10: qty 28, 7d supply, fill #0

## 2024-02-10 MED ORDER — POTASSIUM CHLORIDE CRYS ER 20 MEQ PO TBCR
20.0000 meq | EXTENDED_RELEASE_TABLET | Freq: Every day | ORAL | 0 refills | Status: DC
  Filled 2024-02-10: qty 5, 5d supply, fill #0

## 2024-02-10 MED ORDER — AMLODIPINE BESYLATE 5 MG PO TABS
5.0000 mg | ORAL_TABLET | Freq: Every day | ORAL | 2 refills | Status: AC
  Filled 2024-02-10: qty 30, 30d supply, fill #0

## 2024-02-10 MED ORDER — FUROSEMIDE 20 MG PO TABS
20.0000 mg | ORAL_TABLET | Freq: Every day | ORAL | 0 refills | Status: DC
  Filled 2024-02-10: qty 5, 5d supply, fill #0

## 2024-02-10 NOTE — Progress Notes (Signed)
   [address] [place] [place] [address]             [phone]  5 Days Post-Op Procedure(s) (LRB): CORONARY ARTERY BYPASS GRAFTING (CABG) X THREE USING LEFT INTERNAL MAMMARY ARTERY, RIGHT LEG GREATER SAPHENOUS VEIN, LEFT RADIAL ARTERY (N/A) SURGICAL PROCUREMENT, ARTERY, RADIAL (Left) ECHOCARDIOGRAM, TRANSESOPHAGEAL (N/A) Subjective: Good day yesterday and no new concerns. Independent with mobility and transfers.  Feels he is ready to return home.  Objective: Vital signs in last 24 hours: Temp:  [98 F (36.7 C)-98.9 F (37.2 C)] 98.6 F (37 C) (10/19 2337) Pulse Rate:  [67-72] 67 (10/19 2337) Cardiac Rhythm: Normal sinus rhythm (10/19 2013) Resp:  [16-23] 16 (10/19 2337) BP: (137-156)/(57-81) 138/69 (10/19 2337) SpO2:  [93 %-98 %] 93 % (10/19 2337)    Intake/Output from previous day: 10/19 0701 - 10/20 0700 In: 360 [P.O.:360] Out: 1350 [Urine:1350] Intake/Output this shift: No intake/output data recorded.  General appearance: alert, cooperative, and no distress Neurologic: intact Heart: RRR, few PVC's. Lungs: Normal respiratory effort on RA, clear breath sounds Abdomen: soft, no tenderness Extremities: no peripheral edema.  Wound: the sternotomy incision, left radial arm incision,  and LE EVH incision are all well approximated and dry.   Lab Results: Recent Labs    02/09/24 0346 02/10/24 0256  WBC 8.3 7.7  HGB 9.4* 9.8*  HCT 28.1* 29.4*  PLT 184 217   BMET:  Recent Labs    02/09/24 0346 02/10/24 0256  NA 140 142  K 3.9 3.6  CL 107 107  CO2 24 25  GLUCOSE 115* 91  BUN 21 18  CREATININE 0.91 0.93  CALCIUM  8.1* 8.3*    PT/INR: No results for input(s): LABPROT, INR in the last 72 hours. ABG    Component Value Date/Time   PHART 7.307 (L) 02/05/2024 2159   HCO3 21.9 02/05/2024 2159   TCO2 23 02/05/2024 2159   ACIDBASEDEF 4.0 (H) 02/05/2024 2159   O2SAT 97 02/05/2024 2159   CBG (last 3)  Recent Labs    02/09/24 1654  02/09/24 2113 02/10/24 0605  GLUCAP 237* 158* 119*    Assessment/Plan: S/P Procedure(s) (LRB): CORONARY ARTERY BYPASS GRAFTING (CABG) X THREE USING LEFT INTERNAL MAMMARY ARTERY, RIGHT LEG GREATER SAPHENOUS VEIN, LEFT RADIAL ARTERY (N/A) SURGICAL PROCUREMENT, ARTERY, RADIAL (Left) ECHOCARDIOGRAM, TRANSESOPHAGEAL (N/A)  -POD5 CABG x 3. Progressing well. On ASA, low-dose metoprolol, amlodipine  (for radial graft), atorvastatin .  BP trending up so increasing the metoprolol and amlodipine  today. Now independent with mobility and wants to return home.   -PULM- on RA with normal WOB, using the IS as advised.    -GI- tolerating cardiac diet, having appropriate bowel function.   -RENAL- Normal renal function, Wt ~3kg+ from pre-op.  Will continue daily Lasix  20mg  daily x 5 days.  -ENDO- type 2 DM on insulin  pre-op. CBG's 120-230's on metformin  and Semglee  16u daily. Uses continuous sensor and Toujeo  at home along with the metformin  and Jardiance . Will resume all at discharge.   -Disposition-discharge today. Will Schedule f/u with Manuelita Rough, PA-C in our office on 10/30 and with Dr. Daniel on 11/13.  Instructions given.      LOS: 5 days    Maelyn Berrey G. Jvion Turgeon, PA-C  02/10/2024

## 2024-02-10 NOTE — TOC Transition Note (Signed)
 Transition of Care (TOC) - Discharge Note Rayfield Gobble RN, BSN Inpatient Care Management Unit 4E- RN Case Manager See Treatment Team for direct phone #   Patient Details  Name: Justin Galvan MRN: [id number] Date of Birth: [birth year]  Transition of Care [facility]) CM/SW Contact:  Gobble Rayfield Hurst, RN Phone Number: 02/10/2024, 3:23 PM   Clinical Narrative:    Pt stable for transition home today, wife to transport home.   CM received notice from [facility] liaison that they are following with TCTS office protocol referral- [facility] to follow up with pt post discharge for [facility] needs- post surgical assessment and education.   No DME needs noted.   IP CM interventions have been completed no further needs noted.   Final next level of care: Home w Home Health Services Barriers to Discharge: Barriers Resolved   Patient Goals and CMS Choice Patient states their goals for this hospitalization and ongoing recovery are:: Plan to return home once stable.          Discharge Placement                 Home w/ [facility]      Discharge Plan and Services Additional resources added to the After Visit Summary for   In-house Referral: NA Discharge Planning Services: CM Consult Post Acute Care Choice: Home Health          DME Arranged: N/A DME Agency: NA         HH Agency: [facility] Date HH Agency Contacted: 02/10/24 Time HH Agency Contacted: 1000 Representative spoke with at [facility] Agency: [place]  Social Drivers of Health (SDOH) Interventions SDOH Screenings   Food Insecurity: Patient Unable To Answer (02/05/2024)  Depression (PHQ2-9): Low Risk  (09/23/2023)  Tobacco Use: Medium Risk (02/07/2024)     Readmission Risk Interventions     No data to display

## 2024-02-10 NOTE — Progress Notes (Signed)
 CARDIAC REHAB PHASE I   Post [facility] education including site care, restrictions, heart healthy diet, sternal precautions, IS use at home, home needs at discharge, exercise guidelines, smoking cessation and CRP2 reviewed. All questions and concerns addressed. Will refer to [place] for CRP2.  [phone]    Isaiah JAYSON [place], RN BSN 02/10/2024 10:34 AM

## 2024-02-11 ENCOUNTER — Telehealth: Payer: Self-pay | Admitting: *Deleted

## 2024-02-11 DIAGNOSIS — Z48812 Encounter for surgical aftercare following surgery on the circulatory system: Secondary | ICD-10-CM | POA: Diagnosis not present

## 2024-02-11 LAB — ECHO INTRAOPERATIVE TEE
AR max vel: 1.93 cm2
AV Area VTI: 2.16 cm2
AV Area mean vel: 1.95 cm2
AV Mean grad: 4 mmHg
AV Peak grad: 8 mmHg
Ao pk vel: 1.41 m/s
Height: 67 in
MV VTI: 2.93 cm2
Weight: 2800 [oz_av]

## 2024-02-11 NOTE — Transitions of Care (Post Inpatient/ED Visit) (Signed)
 02/11/2024  Name: Justin Galvan MRN: [id number] DOB: [birth year]  Today's TOC FU Call Status: Today's TOC FU Call Status:: Successful TOC FU Call Completed Patient's Name and Date of Birth confirmed.  Transition Care Management Follow-up Telephone Call Date of Discharge: 02/10/24 Discharge Facility: [facility]) Type of Discharge: Inpatient Admission Primary Inpatient Discharge Diagnosis:: S/P CABG S x 3 How have you been since you were released from the hospital?: Better Any questions or concerns?: No  Items Reviewed: Did you receive and understand the discharge instructions provided?: Yes Medications obtained,verified, and reconciled?: Yes (Medications Reviewed) Any new allergies since your discharge?: No Dietary orders reviewed?: No Do you have support at home?: Yes People in Home [RPT]: spouse Name of Support/Comfort Primary Source: Angela  Medications Reviewed Today: Medications Reviewed Today     Reviewed by Kennieth Cathlean DEL, RN (Case Manager) on 02/11/24 at 1102  Med List Status: <None>   Medication Order Taking? Sig Documenting Provider Last Dose Status Informant  amLODipine  (NORVASC ) 5 MG tablet [id number] Yes Take 1 tablet (5 mg total) by mouth daily. Roddenberry, Myron G, PA-C  Active   aspirin  EC 325 MG tablet [id number] Yes Take 1 tablet (325 mg total) by mouth daily. Roddenberry, Myron G, PA-C  Active   atorvastatin  (LIPITOR ) 80 MG tablet [id number] Yes TAKE 1 TABLET (80 MG TOTAL) BY MOUTH DAILY AT JEREL Joshua Debby LITTIE, MD  Active Self, Pharmacy Records  Continuous Glucose Sensor (FREESTYLE LIBRE 3 PLUS SENSOR) [place] [id number]  Apply 1 Act topically every 14 (fourteen) days. Change sensor every 15 days.  Patient not taking: Reported on 02/11/2024   Joshua Debby LITTIE, MD  Active Self, Pharmacy Records           Med Note GENEVIA, MARLYCE JAYSON Kitchens Feb 03, 2024 11:25 AM) Patient is not currently using the FreeStyle Libre 3.  furosemide  (LASIX ) 20 MG tablet [id number]  Yes Take 1 tablet (20 mg total) by mouth daily. For 5 days then stop. Roddenberry, Myron G, PA-C  Active   icosapent  Ethyl (VASCEPA ) 1 g capsule [id number] Yes TAKE 2 CAPSULES BY MOUTH 2 TIMES DAILY.  Patient taking differently: Take 1 g by mouth daily at 12 noon. TAKE 2 CAPSULES BY MOUTH 2 TIMES DAILY.   Joshua Debby LITTIE, MD  Active Self, Pharmacy Records           Med Note STEFFI NIAN   Mon Feb 03, 2024 12:03 PM) No recent fills.  indapamide  (LOZOL ) 1.25 MG tablet [id number] Yes TAKE 1 TABLET BY MOUTH DAILY. Joshua Debby LITTIE, MD  Active Self, Pharmacy Records  insulin  glargine, 2 Unit Dial, (TOUJEO  MAX SOLOSTAR) 300 UNIT/ML Solostar Pen [id number] Yes INJECT 20 UNITS INTO THE SKIN DAILY  Patient taking differently: Inject 20 Units into the skin every morning.   Joshua Debby LITTIE, MD  Active Self, Pharmacy Records           Med Note MARC, NATALIE M   Wed Feb 05, 2024  7:12 AM) Patient states he took 10 units at 0500 am on 02/05/24.  Insulin  Pen Needle 32G X 6 MM MISC [id number]  1 Act by Does not apply route daily. Joshua Debby LITTIE, MD  Active Self, Pharmacy Records  JARDIANCE  10 MG TABS tablet [id number] Yes TAKE 1 TABLET BY MOUTH DAILY BEFORE BREAKFAST. Joshua Debby LITTIE, MD  Active Self, Pharmacy Records  metFORMIN  (GLUCOPHAGE -XR) 750 MG 24 hr tablet [id number] Yes Take 1 tablet (750 mg total) by mouth daily  with breakfast. Joshua Debby CROME, MD  Active Self, Pharmacy Records  methocarbamol (ROBAXIN) 500 MG tablet [id number] Yes Take 1 tablet (500 mg total) by mouth every 8 (eight) hours as needed for muscle spasms. Roddenberry, Myron G, PA-C  Active   metoprolol tartrate (LOPRESSOR) 25 MG tablet [id number] Yes Take 1 tablet (25 mg total) by mouth 2 (two) times daily. Roddenberry, Myron G, PA-C  Active   oxyCODONE  (OXY IR/ROXICODONE ) 5 MG immediate release tablet [id number] Yes Take 1 tablet (5 mg total) by mouth every 6 (six) hours as needed for up to 7 days for severe pain (pain score 7-10).  Roddenberry, Myron G, PA-C  Active   PARoxetine  (PAXIL ) 40 MG tablet [id number] Yes TAKE 1 TABLET BY MOUTH EVERY DAY Joshua Debby CROME, MD  Active Self, Pharmacy Records  potassium chloride  SA (KLOR-CON  M) 20 MEQ tablet [id number] Yes Take 1 tablet (20 mEq total) by mouth daily. For 5 days while taking the Lasix  (furosemide ). Roddenberry, Myron G, PA-C  Active   terazosin  (HYTRIN ) 2 MG capsule [id number] Yes TAKE 1 CAPSULE BY MOUTH EVERYDAY AT BEDTIME Joshua Debby CROME, MD  Active Self, Pharmacy Records            Home Care and Equipment/Supplies: Were Home Health Services Ordered?: Yes Name of Home Health Agency:: [facility] Has Agency set up a time to come to your home?: Yes First Home Health Visit Date: 02/11/24 Any new equipment or medical supplies ordered?: NA  Functional Questionnaire: Do you need assistance with bathing/showering or dressing?: No Do you need assistance with meal preparation?: No Do you need assistance with eating?: No Do you have difficulty maintaining continence: No Do you need assistance with getting out of bed/getting out of a chair/moving?: No Do you have difficulty managing or taking your medications?: No  Follow up appointments reviewed: PCP Follow-up appointment confirmed?: Yes Date of PCP follow-up appointment?: 02/12/24 Follow-up Provider: Almarie Cleveland Specialist [facility] Follow-up appointment confirmed?: Yes Date of Specialist follow-up appointment?: 02/20/24 Follow-Up Specialty Provider:: [id number] Manuelita Rough 11:30 cardiology  [address] 10:05 [facility]  [id number]  11:00 [facility] Card and thoracic Do you need transportation to your follow-up appointment?: No Do you understand care options if your condition(s) worsen?: Yes-patient verbalized understanding  SDOH Interventions Today    Flowsheet Row Most Recent Value  SDOH Interventions   Food Insecurity Interventions Intervention Not Indicated  Housing Interventions Intervention  Not Indicated  Transportation Interventions Intervention Not Indicated, Patient Resources (Friends/Family)  Utilities Interventions Intervention Not Indicated   Discussed and offered 30 day TOC program.  Patient  declined.  The patient has been provided with contact information for the care management team and has been advised to call with any health -related questions or concerns.  The patient verbalized understanding with current plan of care.  The patient is directed to their insurance card regarding availability of benefits coverage   Care guide made follow up appt with PCP  Cathlean Headland BSN RN [facility] Health Care Management Coordinator [email or web address] Direct Dial: [phone]  Fax: [phone] Website: [email or web address]

## 2024-02-12 ENCOUNTER — Telehealth (HOSPITAL_COMMUNITY): Payer: Self-pay

## 2024-02-12 ENCOUNTER — Encounter: Payer: Self-pay | Admitting: Internal Medicine

## 2024-02-12 ENCOUNTER — Ambulatory Visit: Admitting: Internal Medicine

## 2024-02-12 VITALS — BP 138/70 | HR 61 | Temp 97.7°F | Ht 67.0 in | Wt 173.6 lb

## 2024-02-12 DIAGNOSIS — Z7984 Long term (current) use of oral hypoglycemic drugs: Secondary | ICD-10-CM

## 2024-02-12 DIAGNOSIS — E1151 Type 2 diabetes mellitus with diabetic peripheral angiopathy without gangrene: Secondary | ICD-10-CM

## 2024-02-12 DIAGNOSIS — E1169 Type 2 diabetes mellitus with other specified complication: Secondary | ICD-10-CM | POA: Diagnosis not present

## 2024-02-12 DIAGNOSIS — Z951 Presence of aortocoronary bypass graft: Secondary | ICD-10-CM

## 2024-02-12 DIAGNOSIS — E785 Hyperlipidemia, unspecified: Secondary | ICD-10-CM

## 2024-02-12 DIAGNOSIS — I70209 Unspecified atherosclerosis of native arteries of extremities, unspecified extremity: Secondary | ICD-10-CM

## 2024-02-12 DIAGNOSIS — I25110 Atherosclerotic heart disease of native coronary artery with unstable angina pectoris: Secondary | ICD-10-CM | POA: Diagnosis not present

## 2024-02-12 MED ORDER — EMPAGLIFLOZIN 25 MG PO TABS: 25.0000 mg | ORAL_TABLET | Freq: Every day | ORAL | 1 refills | Status: AC

## 2024-02-12 MED FILL — Heparin Sodium (Porcine) Inj 1000 Unit/ML: INTRAMUSCULAR | Qty: 10 | Status: AC

## 2024-02-12 MED FILL — Sodium Bicarbonate IV Soln 8.4%: INTRAVENOUS | Qty: 50 | Status: AC

## 2024-02-12 MED FILL — Electrolyte-R (PH 7.4) Solution: INTRAVENOUS | Qty: 5000 | Status: AC

## 2024-02-12 MED FILL — Mannitol IV Soln 20%: INTRAVENOUS | Qty: 500 | Status: AC

## 2024-02-12 MED FILL — Sodium Chloride IV Soln 0.9%: INTRAVENOUS | Qty: 2000 | Status: AC

## 2024-02-12 NOTE — Patient Instructions (Signed)
 For the jardiance  you are currently taking 10 mg daily. We are going to increase this to 25 mg daily. If you want to you can take 2 pills daily until you run out of current then fill the new dose at the pharmacy.

## 2024-02-12 NOTE — Assessment & Plan Note (Addendum)
 Managed post-coronary artery bypass grafting with no current chest pain and improved exercise capacity. Optimal glycemic control is emphasized for cardiac health. Continue the cardiac rehabilitation program and maintain follow-up with the cardiologist for ongoing management. Not having angina but having SOB and will need ongoing follow up as well as cardiac rehab in near future.

## 2024-02-12 NOTE — Telephone Encounter (Signed)
Per phase I cardiac rehab, fax referral to [place].

## 2024-02-12 NOTE — Progress Notes (Signed)
 Subjective:   Patient ID: Justin Galvan, male    DOB: 1960/05/07, 63 y.o.   MRN: [id number]  Discussed the use of AI scribe software for clinical note transcription with the patient, who gave verbal consent to proceed.  History of Present Illness Justin Galvan is a 63 year old male with coronary artery disease and diabetes who presents for follow-up after recent hospital discharge.  He is experiencing some oozing from the left arm wound but denies any major bleeding or pain. The chest wound, where tubes were previously placed, was initially painful and affected his breathing, but has improved significantly since the removal of the tubes. No chest pain, tightness, pressure, heart racing, or breathing problems, although he mentions being 'shorter of breath' which has been improving.  He is gradually resuming normal activities at home. No stomach problems, constipation, diarrhea, headaches, migraines, lightheadedness, or dizziness. Medications were adjusted during his hospital stay.  Regarding diabetes management, his hemoglobin A1c was 8.0% at the hospital, and it has been in the low eights or mid sevens over the past few years. He is currently taking 20 units of insulin  daily, Jardiance  once daily, and metformin . No issues with these medications.  He recalls experiencing depression symptoms after his first heart attack but denies any current mood changes.  PMH, [facility], social history reviewed and updated  MEdication reconciliation done with discharge list and adjusted as needed.   Review of Systems  Constitutional: Negative.   HENT: Negative.    Eyes: Negative.   Respiratory:  Positive for shortness of breath. Negative for cough and chest tightness.   Cardiovascular:  Negative for chest pain, palpitations and leg swelling.  Gastrointestinal:  Negative for abdominal distention, abdominal pain, constipation, diarrhea, nausea and vomiting.  Musculoskeletal: Negative.   Skin: Negative.    Neurological: Negative.   Psychiatric/Behavioral: Negative.      Objective:  Physical Exam Constitutional:      Appearance: He is well-developed.  HENT:     Head: Normocephalic and atraumatic.  Cardiovascular:     Rate and Rhythm: Normal rate and regular rhythm.  Pulmonary:     Effort: Pulmonary effort is normal. No respiratory distress.     Breath sounds: Normal breath sounds. No wheezing or rales.     Comments: Wound sternum without signs of infection Chest:     Chest wall: Tenderness present.  Abdominal:     General: Bowel sounds are normal. There is no distension.     Palpations: Abdomen is soft.     Tenderness: There is no abdominal tenderness.  Musculoskeletal:     Cervical back: Normal range of motion.  Skin:    General: Skin is warm and dry.     Comments: Wound left arm without signs of infection or bleeding  Neurological:     Mental Status: He is alert and oriented to person, place, and time.     Coordination: Coordination normal.     Vitals:   02/12/24 1036  BP: 138/70  Pulse: 61  Temp: 97.7 F (36.5 C)  TempSrc: Oral  SpO2: 97%  Weight: 173 lb 9.6 oz (78.7 kg)  Height: 5' 7 (1.702 m)    Assessment and Plan Assessment & Plan Status post coronary artery bypass grafting with post-surgical wound care   Post-surgical recovery is progressing well. Chest wound pain has improved with no fever, chills, or unusual drainage. Shortness of breath is improving, and there are no new chest pain or breathing issues. Continue the cardiac rehabilitation  program and follow up with the surgeon and cardiologist for ongoing post-operative care.  Coronary artery disease   Managed post-coronary artery bypass grafting with no current chest pain and improved exercise capacity. Optimal glycemic control is emphasized for cardiac health. Continue the cardiac rehabilitation program and maintain follow-up with the cardiologist for ongoing management.  Type 2 diabetes mellitus    Recent hemoglobin A1c is 8.0% which is uncontrolled. Current medications include insulin , Jardiance , and metformin , with no adverse effects. Discussed increasing the Jardiance  dose to improve glycemic control. Increase Jardiance  dose to 25 mg daily. Monitor blood glucose levels and adjust treatment as needed. Re-evaluate hemoglobin A1c in 2-3 months to assess the impact of medication adjustment.

## 2024-02-12 NOTE — Assessment & Plan Note (Signed)
 Post-surgical recovery is progressing well. Chest wound pain has improved with no fever, chills, or unusual drainage. Shortness of breath is improving, and there are no new chest pain or breathing issues. Continue the cardiac rehabilitation program and follow up with the surgeon and cardiologist for ongoing post-operative care.

## 2024-02-12 NOTE — Assessment & Plan Note (Signed)
 Recent hemoglobin A1c is 8.0% which is uncontrolled. Current medications include insulin , Jardiance , and metformin , with no adverse effects. Discussed increasing the Jardiance  dose to improve glycemic control. Increase Jardiance  dose to 25 mg daily. Monitor blood glucose levels and adjust treatment as needed. Re-evaluate hemoglobin A1c in 2-3 months to assess the impact of medication adjustment.

## 2024-02-12 NOTE — Assessment & Plan Note (Signed)
 Continue vascepa  and atorvastatin  80 mg daily.

## 2024-02-14 ENCOUNTER — Other Ambulatory Visit: Payer: Self-pay

## 2024-02-14 DIAGNOSIS — R079 Chest pain, unspecified: Secondary | ICD-10-CM

## 2024-02-20 ENCOUNTER — Ambulatory Visit

## 2024-02-20 ENCOUNTER — Ambulatory Visit
Admission: RE | Admit: 2024-02-20 | Discharge: 2024-02-20 | Disposition: A | Discharge status: Home / Self Care | Source: Ambulatory Visit

## 2024-02-20 VITALS — BP 132/70 | HR 69 | Ht 67.0 in | Wt 177.8 lb

## 2024-02-20 DIAGNOSIS — I2510 Atherosclerotic heart disease of native coronary artery without angina pectoris: Secondary | ICD-10-CM | POA: Insufficient documentation

## 2024-02-20 DIAGNOSIS — R079 Chest pain, unspecified: Secondary | ICD-10-CM | POA: Insufficient documentation

## 2024-02-20 DIAGNOSIS — Z951 Presence of aortocoronary bypass graft: Secondary | ICD-10-CM | POA: Insufficient documentation

## 2024-02-20 MED ORDER — CEPHALEXIN 500 MG PO CAPS: 500.0000 mg | ORAL_CAPSULE | Freq: Two times a day (BID) | ORAL | 0 refills | Status: AC

## 2024-02-20 NOTE — Progress Notes (Signed)
 [address] [place] [id number]             [phone]       HPI:  Justin Galvan is a 63 year old man with medical history of hypertension, CAD,type 2 diabetes, BPH and hyperlipidemia who returns for routine postoperative follow-up having undergone CABG x 3 (SVG - RPL, left radial - OM2, LIMA - LAD), Endoscopic Vein Harvest, Open left radial artery harvest on 02/05/2024 with Dr. Daniel.  The patient's early postoperative recovery while in the hospital was notable for starting amlodipine  for radial artery harvest.  He was started on lopressor. He was routinely diuresed. He was stable for discharge home on 02/10/2024.  Since hospital discharge the patient reports that he has been doing well.  His pain is well controlled with only the use of tylenol .  He has been trying to stay active.  He has been walking daily.  His incision sites have been healing but he has noticed that at his left radial artery harvest site there has been drainage and increased erythema.  He has been keeping his incision sites clean.  He has been seen by his PCP and medications have been adjusted for his type 2 diabetes.  He denies chest pain, shortness of breath and lower leg swelling.     Allergies as of 02/20/2024       Reactions   Protamine Anaphylaxis   Got protamine during heart surgery on 02/05/24 - became profoundly hypotensive with skin erythema   Semaglutide (0.25 Or 0.5mg -dos) Nausea And Vomiting        Medication List        Accurate as of February 20, 2024 11:28 AM. If you have any questions, ask your nurse or doctor.          STOP taking these medications    furosemide  20 MG tablet Commonly known as: LASIX  Stopped by: Manuelita CHRISTELLA Rough   potassium chloride  SA 20 MEQ tablet Commonly known as: KLOR-CON  M Stopped by: Manuelita CHRISTELLA Rough       TAKE these medications    amLODipine  5 MG tablet Commonly known as: NORVASC  Take 1 tablet (5 mg total) by mouth daily.    aspirin  EC 325 MG tablet Take 1 tablet (325 mg total) by mouth daily.   atorvastatin  80 MG tablet Commonly known as: LIPITOR  TAKE 1 TABLET (80 MG TOTAL) BY MOUTH DAILY AT 6PM   empagliflozin  25 MG Tabs tablet Commonly known as: Jardiance  Take 1 tablet (25 mg total) by mouth daily before breakfast.   FreeStyle Libre 3 Plus Sensor Misc Apply 1 Act topically every 14 (fourteen) days. Change sensor every 15 days.   icosapent  Ethyl 1 g capsule Commonly known as: VASCEPA  TAKE 2 CAPSULES BY MOUTH 2 TIMES DAILY. What changed:  how much to take how to take this when to take this   indapamide  1.25 MG tablet Commonly known as: LOZOL  TAKE 1 TABLET BY MOUTH DAILY.   Insulin  Pen Needle 32G X 6 MM Misc 1 Act by Does not apply route daily.   metFORMIN  750 MG 24 hr tablet Commonly known as: GLUCOPHAGE -XR Take 1 tablet (750 mg total) by mouth daily with breakfast.   methocarbamol 500 MG tablet Commonly known as: ROBAXIN Take 1 tablet (500 mg total) by mouth every 8 (eight) hours as needed for muscle spasms.   metoprolol tartrate 25 MG tablet Commonly known as: LOPRESSOR Take 1 tablet (25 mg  total) by mouth 2 (two) times daily.   PARoxetine  40 MG tablet Commonly known as: PAXIL  TAKE 1 TABLET BY MOUTH EVERY DAY   terazosin  2 MG capsule Commonly known as: HYTRIN  TAKE 1 CAPSULE BY MOUTH EVERYDAY AT BEDTIME   Toujeo  Max SoloStar 300 UNIT/ML Solostar Pen Generic drug: insulin  glargine (2 Unit Dial) INJECT 20 UNITS INTO THE SKIN DAILY         ROS  Review of Systems  Constitutional: Negative.  Negative for fever and malaise/fatigue.  Respiratory: Negative.  Negative for cough and shortness of breath.   Cardiovascular:  Negative for chest pain and leg swelling.     BP 132/70 (BP Location: Right Arm, Patient Position: Sitting, Cuff Size: Normal)   Pulse 69   Ht 5' 7 (1.702 m)   Wt 177 lb 12.8 oz (80.6 kg)   SpO2 96% Comment: RA  BMI 27.85 kg/m   Physical  Exam Constitutional:      Appearance: Normal appearance.  HENT:     Head: Normocephalic and atraumatic.  Cardiovascular:     Rate and Rhythm: Normal rate and regular rhythm.     Heart sounds: Normal heart sounds, S1 normal and S2 normal.  Pulmonary:     Effort: Pulmonary effort is normal.     Breath sounds: Normal breath sounds.  Skin:    General: Skin is warm and dry.      Neurological:     General: No focal deficit present.     Mental Status: He is alert and oriented to person, place, and time.       Imaging:  CLINICAL DATA:  CABG.   EXAM: CHEST - 2 VIEW   COMPARISON:  02/10/2024   FINDINGS: Sternotomy wires unchanged. Lungs are adequately inflated with minimal blunting of the left costophrenic angle likely minimal residual pleural fluid versus pleuroparenchymal scarring. No lobar consolidation. No pneumothorax. Cardiomediastinal silhouette is normal. Remainder of the exam is unchanged.   IMPRESSION: Minimal blunting of the left costophrenic angle likely minimal residual pleural fluid versus pleuroparenchymal scarring.     Electronically Signed   By: Toribio Agreste M.D.   On: 02/20/2024 11:18     Assessment/Plan:  S/P CABG x 3 -We reviewed today's chest x ray. Small left sided pleural effusion. Continue to use incentive spirometer -He is to continue to increase his activity/exercise as tolerated -Discussed the continuance of sternal precautions until a full 6 weeks from surgery -Left are radial artery harvest site with erythema and serous drainage.  Erythema has started to increase.  Keflex 500 mg BID for 7 days prescribed -Keep incision sites clean and dry -Cardiology appointment scheduled for next week -Continue follow up with PCP for type 2 diabetes -Follow up with [facility] in 2 weeks with chest xray   Manuelita CHRISTELLA Rough, PA-C 11:28 AM 02/20/24

## 2024-02-20 NOTE — Patient Instructions (Signed)
-  Take Keflex as prescribed. Please contact our clinic if your arm starts to have increased redness, swelling, increased drainage or fever -Follow up in 2 weeks with chest xray

## 2024-02-24 ENCOUNTER — Encounter: Payer: Self-pay | Admitting: *Deleted

## 2024-02-26 ENCOUNTER — Encounter: Payer: Self-pay | Admitting: Emergency Medicine

## 2024-02-26 ENCOUNTER — Ambulatory Visit: Attending: Emergency Medicine | Admitting: Emergency Medicine

## 2024-02-26 VITALS — BP 122/68 | HR 68 | Ht 67.0 in | Wt 174.0 lb

## 2024-02-26 DIAGNOSIS — E785 Hyperlipidemia, unspecified: Secondary | ICD-10-CM

## 2024-02-26 DIAGNOSIS — I5020 Unspecified systolic (congestive) heart failure: Secondary | ICD-10-CM

## 2024-02-26 DIAGNOSIS — I10 Essential (primary) hypertension: Secondary | ICD-10-CM

## 2024-02-26 DIAGNOSIS — Z794 Long term (current) use of insulin: Secondary | ICD-10-CM

## 2024-02-26 DIAGNOSIS — I25110 Atherosclerotic heart disease of native coronary artery with unstable angina pectoris: Secondary | ICD-10-CM | POA: Diagnosis not present

## 2024-02-26 DIAGNOSIS — G4730 Sleep apnea, unspecified: Secondary | ICD-10-CM

## 2024-02-26 DIAGNOSIS — E119 Type 2 diabetes mellitus without complications: Secondary | ICD-10-CM

## 2024-02-26 DIAGNOSIS — Z951 Presence of aortocoronary bypass graft: Secondary | ICD-10-CM

## 2024-02-26 LAB — CBC

## 2024-02-26 NOTE — Progress Notes (Signed)
 Cardiology Office Note:    Date:  02/26/2024  ID:  Justin Galvan, DOB 05-02-1960, MRN [id number] PCP: Joshua Debby CROME, MD  [facility] Providers Cardiologist:  Alm Clay, MD Cardiology APP:  Rana Lum CROME, NP       Patient Profile:       Chief Complaint: Follow-up cardiac catheterization History of Present Illness:  Justin Galvan is a 63 y.o. male with visit-pertinent history of coronary artery disease  Hx. stent-RCA 1997; 2 Taxusprox & mid RCA 06/2003, cath 05/06/13 100% RCA, mid LAD disease (Chronic)   He was seen by Dr. Barbaraann in October 2020 for follow-up with abnormal Myoview  suggesting RCA infarction.  At that time he was doing well with no angina.  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  No chest pains noted.    He underwent Lexiscan  Myoview  on 10/08/2023 ordered by his primary care provider with findings that were consistent with infarction but no evidence of ischemia.  He reestablished with cardiology service with Dr. Clay on 01/14/2024.  He presented with progressive anginal symptoms.  Symptoms occurring almost daily and requiring nitroglycerin  for relief.  Symptoms were primarily exertional.  He was started on isosorbide  30 mg daily along with carvedilol  3.125 mg twice daily.  He was ultimately scheduled for cardiac catheterization.  He underwent LHC on 01/21/2024 showing severe left main disease and three-vessel CAD.  It was noted the best option for revascularization is bypass surgery.  He was referred to CT surgery.  He underwent echocardiogram on 01/21/2024 with LVEF 45 to 50%, regional wall motion abnormalities, LV internal cavity mildly dilated, grade 1 DD, RV function and size normal, left atrial size mildly dilated, trivial MR.  He underwent CABG x 3 utilizing (SVG - RPL, left radial - OM2, LIMA - LAD), Endoscopic Vein Harvest, Open left radial artery harvest on 02/05/2024 he was started on low-dose Lopressor and low-dose amlodipine .  Preop TEE showed  moderate-severely reduced systolic function with ejection fraction of 30 to 35%.  Postop TEE showed mildly reduced systolic function with ejection fraction of 50%.  He maintained sinus rhythm with PVCs.  His carvedilol , irbesartan , and isosorbide  are discontinued.  He was started on aspirin  325 mg daily, amlodipine  5 mg daily and metoprolol tartrate 25 mg twice daily.  He was seen for follow-up on 02/20/2024 by cardiothoracic surgery.  He was doing well at that time.  His left radial artery harvest site was with erythema and serous drainage.  He was prescribed Keflex 500 mg twice daily for 7 days.  He was to follow back up in 2 weeks.   Discussed the use of AI scribe software for clinical note transcription with the patient, who gave verbal consent to proceed.  History of Present Illness Justin Galvan is a 63 year old male with coronary artery disease and diabetes who presents for follow-up after bypass surgery.  Today he is doing well without acute cardiovascular concerns.  He has increased his activity tolerance without exertional symptoms.  He denies current chest pain, shortness of breath, or prior anginal equivalent such as burning in the neck, burning headaches, arm pain, and ear discomfort.  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  Review of systems:  Please see the history of present illness. All other systems are reviewed and otherwise negative.      Studies Reviewed:    EKG Interpretation Date/Time:  Wednesday February 26 2024 09:49:55 EST Ventricular Rate:  68 PR Interval:  154 QRS Duration:  106 QT Interval:  440 QTC Calculation: 467 R Axis:   15  Text Interpretation: Normal sinus rhythm Inferior infarct (cited on or before 14-Jan-2024) T wave abnormality, consider lateral ischemia When compared with ECG of 06-Feb-2024 06:44, Non-specific change in ST segment in Lateral leads Nonspecific T wave abnormality no longer evident in Inferior leads T wave  inversion less evident in Anterior leads Inverted T waves have replaced nonspecific T wave abnormality in Lateral leads Confirmed by Rana Dixon [id number]) on 02/26/2024 9:55:17 AM    TEE 02/05/2024 POST-OP IMPRESSIONS  _ Left Ventricle: has mildly reduced systolic function, with an ejection  fraction of 50%. The cavity size was normal. The wall motion is  scar/thinning  akinetic inferior mid pap to base segment remains.  _ Right Ventricle: normal function. The cavity was mildly dilated.  _ Aorta: there is no dissection present in the aorta.  _ Aortic Valve: The aortic valve appears unchanged from pre-bypass.  _ Mitral Valve: The mitral valve appears unchanged from pre-bypass.  _ Tricuspid Valve: The tricuspid valve appears unchanged from pre-bypass.   PRE-OP FINDINGS   Left Ventricle: The left ventricle has moderate-severely reduced systolic  function, with an ejection fraction of 30-35%. The cavity size was normal.  There is no left ventricular hypertrophy. Global hypokinesis with  scar/thinning akinetic region of  inferior wall mid pap to basal segment.   Echocardiogram 01/21/2024  1. Left ventricular ejection fraction, by estimation, is 45 to 50%. Left  ventricular ejection fraction by 3D volume is 48 %. The left ventricle has  mildly decreased function. The left ventricle demonstrates regional wall  motion abnormalities (see  scoring diagram/findings for description). The left ventricular internal  cavity size was mildly dilated. Left ventricular diastolic parameters are  consistent with Grade I diastolic dysfunction (impaired relaxation). The  average left ventricular global  longitudinal strain is -15.2 %. The global longitudinal strain is  abnormal.   2. Right ventricular systolic function is normal. The right ventricular  size is normal. Tricuspid regurgitation signal is inadequate for assessing  PA pressure.   3. Left atrial size was mildly dilated.   4. The mitral  valve is normal in structure. Trivial mitral valve  regurgitation.   5. The aortic valve has an indeterminant number of cusps. Aortic valve  regurgitation is not visualized. Aortic valve sclerosis is present, with  no evidence of aortic valve stenosis.   6. The inferior vena cava is normal in size with greater than 50%  respiratory variability, suggesting right atrial pressure of 3 mmHg.   Cardiac catheterization 01/21/2024   Ost 1st Diag to 1st Diag lesion is 90% stenosed.   Ost 2nd Diag lesion is 30% stenosed.   Mid LM to Dist LM lesion is 70% stenosed.   Prox RCA lesion is 90% stenosed.   Mid RCA lesion is 100% stenosed.   Ost Cx to Prox Cx lesion is 99% stenosed.   Mid LAD-1 lesion is 20% stenosed.   Mid LAD-2 lesion is 65% stenosed.   Mid Cx lesion is 50% stenosed.   1st Diag lesion is 80% stenosed.   3rd Diag lesion is 40% stenosed.   Severe distal left main disease Patent mid LAD stent. Moderately severe mid LAD stenosis Severe ostial Circumflex stenosis Chronic total occlusion of the mid RCA. The distal RCA, PDA and posterolateral artery fill from left to right collaterals.  Mild LV systolic dysfunction with inferior wall hypokinesis.    Recommendations: Diabetic male  with sever left main disease and three vessel CAD. LV function is mildly abnormal by LV gram. I think the best option for revascularization is bypass surgery. Will refer to CT surgery to consider bypass. Echo in holding area before discharge today.  Diagnostic Dominance: Right  Lexiscan  Myoview  10/08/2023   Findings are consistent with infarction. The study is intermediate risk.   No ST deviation was noted.   LV perfusion is abnormal. There is no evidence of ischemia. There is evidence of infarction. Defect 1: There is a large defect with severe reduction in uptake present in the mid to basal inferior location(s) that is fixed. There is abnormal wall motion in the defect area. Consistent with infarction.   Left  ventricular function is abnormal. Nuclear stress EF: 51%. The left ventricular ejection fraction is mildly decreased (45-54%). End diastolic cavity size is normal.   CT images were obtained for attenuation correction and were examined for the presence of coronary calcium  when appropriate.  Echocardiogram 08/03/2020  1. Left ventricular ejection fraction, by estimation, is 50 to 55%. The  left ventricle has low normal function. The left ventricle demonstrates  regional wall motion abnormalities (see scoring diagram/findings for  description). The basal-to-mid inferior  wall is hypokinetic. Left ventricular diastolic parameters are consistent  with Grade I diastolic dysfunction (impaired relaxation).   2. Right ventricular systolic function is normal. The right ventricular  size is normal. Tricuspid regurgitation signal is inadequate for assessing  PA pressure.   3. The mitral valve is normal in structure. Trivial mitral valve  regurgitation. No evidence of mitral stenosis.   4. The aortic valve is tricuspid. There is mild calcification of the  aortic valve. There is mild thickening of the aortic valve. Aortic valve  regurgitation is not visualized. Mild aortic valve sclerosis is present,  with no evidence of aortic valve  stenosis.   5. The inferior vena cava is normal in size with <50% respiratory  variability, suggesting right atrial pressure of 8 mmHg.    Risk Assessment/Calculations:         STOP-Bang Score:  5       Physical Exam:   VS:  BP 122/68 (BP Location: Right Arm, Patient Position: Sitting, Cuff Size: Normal)   Pulse 68   Ht 5' 7 (1.702 m)   Wt 174 lb (78.9 kg)   BMI 27.25 kg/m    Wt Readings from Last 3 Encounters:  02/26/24 174 lb (78.9 kg)  02/20/24 177 lb 12.8 oz (80.6 kg)  02/12/24 173 lb 9.6 oz (78.7 kg)    GEN: Well nourished, well developed in no acute distress NECK: No JVD; No carotid bruits CARDIAC: RRR, no murmurs, rubs, gallops RESPIRATORY:  Clear  to auscultation without rales, wheezing or rhonchi  ABDOMEN: Soft, non-tender, non-distended EXTREMITIES:  No edema; No acute deformity      Assessment and Plan:  Coronary artery disease s/p CABG Hx of stent-RCA 1997; 2 Taxusprox & mid RCA 06/2003, cath 05/06/13 100% RCA, mid LAD disease  - LHC 12/2023 with severe distal left main disease, patent mid LAD stent, moderately severe mid LAD stenosis, severe ostial circumflex stenosis, CTO of mid RCA, distal RCA, PDA, and posterolateral artery filled from left-to-right collaterals.  Referred to CT surgery to consider bypass. - S/p CABG x 3 utilizing (SVG - RPL, left radial - OM2, LIMA - LAD), Endoscopic Vein Harvest, Open left radial artery harvest on 02/05/2024  - Chest x-ray 01/2024 with small left-sided pleural effusion - Today he  is stable without chest pains.  He denies his prior anginal equivalent.  He has increased his activity tolerance without exertional symptoms no indication for further ischemic evaluation at this time - Left radial artery harvest site with improved erythema and no serous drainage or pain on antibiotic.  Sternal incision healing appropriately without signs of infection - Continue aspirin  325 mg daily - Continue amlodipine  5 mg daily - Continue metoprolol tartrate 25 mg twice daily - Continue atorvastatin  80 mg daily  HFmrEF Echocardiogram 07/2020 with LVEF 50 to 55% Echocardiogram 12/2023 with LVEF 45 to 50% TEE (preop CABG) LVEF 30-35% and (postop CABG) with LVEF 50% S/p CABG x 3 on 02/05/2024 Chest x-ray 01/2024 with small left-sided pleural effusion - Today he is euvolemic and well compensated on exam.  Volume status is stable.  He denies dyspnea, orthopnea, PND, [name], weight gain - Will plan to repeat echocardiogram x 3 months to reassess his LV function  - He will have a repeat chest x-ray with CT surgery x 2 weeks to reassess pleural effusion - Continue metoprolol tartrate 25 mg twice daily - Continue indapamide   1.25 mg daily  Hypertension Blood pressure today is well-controlled at 122/68 - Continue amlodipine  5 mg daily - Continue indapamide  1.25 mg daily - He also takes terazosin  2 mg daily for history of BPH  Hyperlipidemia, LDL goal <55 LDL 70 on 09/2023 - Plan for repeat fasting lipid panel today - If LDL remains above goal we will consider adding ezetimibe  - Continue atorvastatin  80 mg daily - Continue Vascepa  1 g daily  T2DM A1c 8% on 01/2024 and uncontrolled - Managed on Jardiance , insulin  gargine, and metformin  - Consider addition of GLP-1 at next PCP visit  Sleep disordered breathing STOP-BANG score of 5 - Reports frequent snoring and daytime somnolence - Plan for an [place] sleep study       Dispo:  Return in about 3 months (around 05/28/2024).  Signed, Lum LITTIE Louis, NP

## 2024-02-26 NOTE — Patient Instructions (Addendum)
 Medication Instructions:  NO CHANGES  Lab Work: FASTING LIPID PANEL, CBC, AND BMET TO BE DONE TODAY.  Testing/Procedures: SCHEDULE TO BE DONE IN 3 MONTHS  Your physician has requested that you have an echocardiogram. Echocardiography is a painless test that uses sound waves to create images of your heart. It provides your doctor with information about the size and shape of your heart and how well your heart's chambers and valves are working. This procedure takes approximately one hour. There are no restrictions for this procedure. Please do NOT wear cologne, perfume, aftershave, or lotions (deodorant is allowed). Please arrive 15 minutes prior to your appointment time.  Please note: We ask at that you not bring children with you during ultrasound (echo/ vascular) testing. Due to room size and safety concerns, children are not allowed in the ultrasound rooms during exams. Our front office staff cannot provide observation of children in our lobby area while testing is being conducted. An adult accompanying a patient to their appointment will only be allowed in the ultrasound room at the discretion of the ultrasound technician under special circumstances. We apologize for any inconvenience.  WatchPAT? is an FDA-cleared portable home sleep study test that uses a watch and 3 points of contact to monitor 7 different channels, including your heart rate, oxygen saturation, body position, snoring, and chest motion.  The study is easy to use from the comfort of your own home and accurately detect sleep apnea.  Before bed, you attach the chest sensor, attached the sleep apnea bracelet to your nondominant hand, and attach the finger probe.  After the study, the raw data is downloaded from the watch and scored for apnea events.   For more information: [email or web address]  Patient Testing Instructions:  Once our office has received insurance approval for you to complete this test, we will  contact you with a PIN to activate the device.  This typically takes 2-3 weeks.  Please do not open the box until approved.   Do not put battery into the device until bedtime when you are ready to begin the test. Please call the support number if you need assistance after following the instructions below: 24 hour support line- [phone] or [facility] support at [phone] (option 2)  Download the [facility] WatchPAT One app through the [employer] or [employer]. Be sure to turn on or enable access to Bluetooth in settings on your smartphone. Make sure no other Bluetooth devices are on and within the vicinity of your smartphone and WatchPAT watch during testing.  Make sure to leave your smart phone plugged in and charging all night.  When ready for bed:  Follow the instructions step by step in the WatchPAT One app to activate the testing device. For additional instructions, including video instruction, visit the WatchPAT One video on Youtube. You can search for WatchPAT One within Youtube (video is 4 minutes and 18 seconds) or enter: [email or web address]://youtube/[email or web address] Please note: You will be prompted to enter a PIN to connect via Bluetooth when starting the test. The PIN will be assigned to you after insurance has approved the test.  The device is disposable, but it recommended that you retain the device until you receive a call letting you know the study has been received and the results have been interpreted.  We will let you know if the study did not transmit to us  properly after the test is completed. You do not need to call us  to confirm the receipt of the test.  Please complete the test within 48 hours of receiving PIN.   Frequently Asked Questions:  What is Watch PAT One?  A single use, fully disposable home sleep apnea testing device and will not need to be returned after completion.  What are the requirements to use WatchPAT One?  A successful WatchPAT One sleep study requires a  WatchPAT One device, your smart phone, WatchPAT One app, your PIN number, and internet access. What type of phone do I need?  You should have a smart phone that uses Android 5.1 and above or any iPhone with IOS 10 and above. How can I download the WatchPAT one app?  Based on your device type search for WatchPAT One app either in [employer] for [employer] or [employer] for [employer]. Where will I get my PIN for the study?  Your PIN will be provided by your physician's office after insurance has approved the test. This process typically takes 2-3 weeks. It is used for authentication and if you lose/forget your PIN, please reach out to your provider's office.  I do not have internet at home. Can I still complete a WatchPAT One study?  WatchPAT One needs internet connection throughout the night to be able to transmit the sleep data. You can use your home/local internet or your cellular data package. However, it is always recommended to use home/local internet. It is estimated that between 20MB-30MB of data will be used with each study, but the application will be looking for space in the phone to start the study.  What happens if I lose internet or Bluetooth connection?  During the internet disconnection, your phone will not be able to transmit the sleep data.  All the data, will be stored in your phone.  As soon as the internet connection is back on, the phone will resume sending the sleep data. During the Bluetooth disconnection, WatchPAT One will not be able to to send the sleep data to your phone.  Data will be kept in the WatchPAT One until both devices have Bluetooth connection back on.  As soon as the connection is back on, WatchPAT one will send the sleep data to the phone.  How long do I need to wear the WatchPAT one?  After you start the study, you should wear the device at least 6 hours.  How far should I keep my phone from the device?  During the night, your phone should  remain within 15 feet of where you sleep.  What happens if I leave the room for restroom or other reasons?  Leaving the room for any reason will not cause any problem. As soon as your get back to the room, both devices will reconnect and will continue to send the sleep data. Can I use my phone during the sleep study?  Yes, you can use your phone as usual during the study. But it is recommended to put your WatchPAT One on when you are ready to go to bed.  How will I get my study results?  A soon as you completed your study, your sleep data will be sent to the provider. They will then share the results with you when they are ready.    Follow-Up: At [facility], you and your health needs are our priority.  As part of our continuing mission to provide you with exceptional heart care, our providers are all part of one team.  This team includes your primary Cardiologist (physician) and Advanced  Practice Providers or APPs (Physician Assistants and Nurse Practitioners) who all work together to provide you with the care you need, when you need it.  Your next appointment:   POST ECHOCARDIOGRAM WITH DR. HARDING  Provider:   Alm Clay, MD

## 2024-02-27 ENCOUNTER — Inpatient Hospital Stay

## 2024-02-27 ENCOUNTER — Ambulatory Visit: Payer: Self-pay | Admitting: Emergency Medicine

## 2024-02-27 DIAGNOSIS — E785 Hyperlipidemia, unspecified: Secondary | ICD-10-CM

## 2024-02-27 LAB — BASIC METABOLIC PANEL WITH GFR
BUN/Creatinine Ratio: 25 — ABNORMAL HIGH (ref 10–24)
BUN: 24 mg/dL (ref 8–27)
CO2: 23 mmol/L (ref 20–29)
Calcium: 9.9 mg/dL (ref 8.6–10.2)
Chloride: 100 mmol/L (ref 96–106)
Creatinine, Ser: 0.97 mg/dL (ref 0.76–1.27)
Glucose: 145 mg/dL — ABNORMAL HIGH (ref 70–99)
Potassium: 4.6 mmol/L (ref 3.5–5.2)
Sodium: 138 mmol/L (ref 134–144)
eGFR: 87 mL/min/1.73 (ref >59)

## 2024-02-27 LAB — CBC
Hematocrit: 39.5 % (ref 37.5–51.0)
Hemoglobin: 12.5 g/dL — AB (ref 13.0–17.7)
MCH: 28.8 pg (ref 26.6–33.0)
MCHC: 31.6 g/dL (ref 31.5–35.7)
MCV: 91 fL (ref 79–97)
Platelets: 355 x10E3/uL (ref 150–450)
RBC: 4.34 x10E6/uL (ref 4.14–5.80)
RDW: 13.2 % (ref 11.6–15.4)
WBC: 8.6 x10E3/uL (ref 3.4–10.8)

## 2024-02-27 LAB — LIPID PANEL
Chol/HDL Ratio: 4 ratio (ref 0.0–5.0)
Cholesterol, Total: 132 mg/dL (ref 100–199)
HDL: 33 mg/dL — ABNORMAL LOW (ref >39)
LDL Chol Calc (NIH): 69 mg/dL (ref 0–99)
Triglycerides: 175 mg/dL — ABNORMAL HIGH (ref 0–149)
VLDL Cholesterol Cal: 30 mg/dL (ref 5–40)

## 2024-03-02 MED ORDER — EZETIMIBE 10 MG PO TABS: 10.0000 mg | ORAL_TABLET | Freq: Every day | ORAL | 3 refills | Status: AC

## 2024-03-03 ENCOUNTER — Other Ambulatory Visit: Payer: Self-pay

## 2024-03-03 DIAGNOSIS — Z951 Presence of aortocoronary bypass graft: Secondary | ICD-10-CM

## 2024-03-04 NOTE — Progress Notes (Signed)
 SABRA

## 2024-03-05 ENCOUNTER — Inpatient Hospital Stay

## 2024-03-05 ENCOUNTER — Ambulatory Visit

## 2024-03-05 ENCOUNTER — Ambulatory Visit
Admission: RE | Admit: 2024-03-05 | Discharge: 2024-03-05 | Disposition: A | Discharge status: Home / Self Care | Source: Ambulatory Visit | Attending: Cardiology | Admitting: Cardiology

## 2024-03-05 VITALS — BP 150/74 | HR 67 | Resp 20 | Ht 67.0 in | Wt 175.0 lb

## 2024-03-05 DIAGNOSIS — Z951 Presence of aortocoronary bypass graft: Secondary | ICD-10-CM | POA: Insufficient documentation

## 2024-03-05 DIAGNOSIS — I2510 Atherosclerotic heart disease of native coronary artery without angina pectoris: Secondary | ICD-10-CM | POA: Diagnosis present

## 2024-03-05 NOTE — Patient Instructions (Signed)

## 2024-03-05 NOTE — Progress Notes (Addendum)
 [address] 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 Daniel.  The patient's early postoperative recovery while in the hospital was notable for starting amlodipine  for radial artery harvest.  He was started on lopressor. He was routinely diuresed. He was stable for discharge home on 02/10/2024.  Since hospital discharge the patient reports that he has been doing well.  He has been using tylenol  as needed for pain. He has had resolution of erythema and drainage from his left radial site after completing keflex BID for 7 days. He has been very active and walking daily.  He denies chest pain, shortness of breath and lower leg swelling.     Allergies as of 03/05/2024       Reactions   Protamine Anaphylaxis   Got protamine during heart surgery on 02/05/24 - became profoundly hypotensive with skin erythema   Semaglutide (0.25 Or 0.5mg -dos) Nausea And Vomiting        Medication List        Accurate as of March 05, 2024  9:38 AM. If you have any questions, ask your nurse or doctor.          amLODipine  5 MG tablet Commonly known as: NORVASC  Take 1 tablet (5 mg total) by mouth daily.   aspirin  EC 325 MG tablet Take 1 tablet (325 mg total) by mouth daily.   atorvastatin  80 MG tablet Commonly known as: LIPITOR  TAKE 1 TABLET (80 MG TOTAL) BY MOUTH DAILY AT 6PM   empagliflozin  25 MG Tabs tablet Commonly known as: Jardiance  Take 1 tablet (25 mg total) by mouth daily before breakfast.   ezetimibe  10 MG tablet Commonly known as: ZETIA  Take 1 tablet (10 mg total) by mouth daily.   FreeStyle Libre 3 Plus Sensor Misc Apply 1  Act topically every 14 (fourteen) days. Change sensor every 15 days.   icosapent  Ethyl 1 g capsule Commonly known as: VASCEPA  TAKE 2 CAPSULES BY MOUTH 2 TIMES DAILY. What changed:  how much to take how to take this when to take this   indapamide  1.25 MG tablet Commonly known as: LOZOL  TAKE 1 TABLET BY MOUTH DAILY.   Insulin  Pen Needle 32G X 6 MM Misc 1 Act by Does not apply route daily.   metFORMIN  750 MG 24 hr tablet Commonly known as: GLUCOPHAGE -XR Take 1 tablet (750 mg total) by mouth daily with breakfast.   methocarbamol 500 MG tablet Commonly known as: ROBAXIN Take 1 tablet (500 mg total) by mouth every 8 (eight) hours as needed for muscle spasms.   metoprolol tartrate 25 MG tablet Commonly known as: LOPRESSOR Take 1 tablet (25 mg total) by mouth 2 (two) times daily.   PARoxetine  40 MG tablet Commonly known as: PAXIL  TAKE 1 TABLET BY MOUTH EVERY DAY   terazosin  2 MG capsule Commonly known as: HYTRIN  TAKE 1 CAPSULE BY MOUTH EVERYDAY AT BEDTIME   Toujeo  Max SoloStar 300 UNIT/ML Solostar Pen Generic drug: insulin  glargine (2 Unit Dial) INJECT 20 UNITS INTO THE SKIN DAILY  ROS  Review of Systems  Constitutional: Negative.  Negative for malaise/fatigue.  Respiratory:  Negative for cough and shortness of breath.   Cardiovascular: Negative.  Negative for chest pain and leg swelling.      BP (!) 150/74   Pulse 67   Resp 20   Ht 5' 7 (1.702 m)   Wt 175 lb (79.4 kg)   SpO2 96% Comment: RA  BMI 27.41 kg/m   Physical Exam Constitutional:      Appearance: Normal appearance.  HENT:     Head: Normocephalic and atraumatic.  Cardiovascular:     Rate and Rhythm: Normal rate and regular rhythm.     Heart sounds: Normal heart sounds, S1 normal and S2 normal.  Pulmonary:     Effort: Pulmonary effort is normal.     Breath sounds: Normal breath sounds.  Skin:    General: Skin is warm and dry.      Neurological:     General: No focal deficit  present.     Mental Status: He is alert and oriented to person, place, and time.       Imaging:  CLINICAL DATA:  CABG.   EXAM: CHEST - 2 VIEW   COMPARISON:  02/20/2024   FINDINGS: Sternotomy wires unchanged. Lungs are adequately inflated and otherwise clear. Cardiomediastinal silhouette and remainder of the exam is unchanged.   IMPRESSION: No active cardiopulmonary disease.     Electronically Signed   By: Toribio Agreste M.D.   On: 03/05/2024 09:37     Assessment/Plan:  S/P CABG x 3 -We reviewed today's chest x ray. Pleural effusions have resolved -We discussed driving and he is able to start since not requiring pain medications at this time. First time driving should be a short distance in the day time and he can increase from there -He is cleared to participate in cardiac rehab at this time -He increase his activity/exercise as tolerated -Discussed continuance of sternal precautions until a full 6 weeks from surgery. He should slowly increase his lifting -He has been seen by cardiology and he is to continue aspirin , amlodipine , metoprolol, atorvastatin , zetia , indapamide , vasecpa, and jardiance . Continue follow up as scheduled   -Follow up with [facility] as needed  Manuelita CHRISTELLA Rough, PA-C 9:38 AM 03/05/24

## 2024-03-08 ENCOUNTER — Other Ambulatory Visit: Payer: Self-pay | Admitting: Internal Medicine

## 2024-03-08 DIAGNOSIS — I2510 Atherosclerotic heart disease of native coronary artery without angina pectoris: Secondary | ICD-10-CM

## 2024-03-08 DIAGNOSIS — I10 Essential (primary) hypertension: Secondary | ICD-10-CM

## 2024-03-08 DIAGNOSIS — E1151 Type 2 diabetes mellitus with diabetic peripheral angiopathy without gangrene: Secondary | ICD-10-CM

## 2024-03-08 DIAGNOSIS — F418 Other specified anxiety disorders: Secondary | ICD-10-CM

## 2024-03-12 ENCOUNTER — Telehealth: Payer: Self-pay | Admitting: Cardiology

## 2024-03-12 ENCOUNTER — Other Ambulatory Visit (HOSPITAL_COMMUNITY): Payer: Self-pay

## 2024-03-12 NOTE — Telephone Encounter (Signed)
 Pt spoke with patient regarding Metoprolol and Amlodipine  and whether or not he should still be taking these medications since hospital discharge. Per Lum, NP pt is to continue these medications and relayed this to patient. Pt also wanted pharmacy changed and spoke with [facility] [place]. Pt verbalized understanding and opportunities given for further questions.  Bethena Powell SAUNDERS, RN

## 2024-03-12 NOTE — Telephone Encounter (Signed)
 Pt c/o medication issue:  1. Name of Medication:   amLODipine  (NORVASC ) 5 MG tablet    metoprolol tartrate (LOPRESSOR) 25 MG tablet   2. How are you currently taking this medication (dosage and times per day)? As written  3. Are you having a reaction (difficulty breathing--STAT)? No   4. What is your medication issue? Pt would like to know if these medications are still required to take since being discharged from hospital please advise

## 2024-03-12 NOTE — Telephone Encounter (Signed)
 1. Which medications need to be refilled? (please list name of each medication and dose if known)     metoprolol  tartrate (LOPRESSOR ) 25 MG tablet     2. Would you like to learn more about the convenience, safety, & potential cost savings by using the [facility]?   no   3. Are you open to using the [facility] (Type [facility]. no     4. Which pharmacy/location (including street and city if local pharmacy) is medication to be sent to?  [id number] [place], [place] - [address]     5. Do they need a 30 day or 90 day supply? 30 day

## 2024-03-13 MED ORDER — METOPROLOL TARTRATE 25 MG PO TABS: 25.0000 mg | ORAL_TABLET | Freq: Two times a day (BID) | ORAL | 1 refills | Status: AC

## 2024-03-13 NOTE — Telephone Encounter (Signed)
 Refill sent.

## 2024-03-17 ENCOUNTER — Encounter: Payer: Self-pay | Admitting: Cardiology

## 2024-03-17 DIAGNOSIS — G4731 Primary central sleep apnea: Secondary | ICD-10-CM

## 2024-03-17 DIAGNOSIS — G4733 Obstructive sleep apnea (adult) (pediatric): Secondary | ICD-10-CM

## 2024-03-17 DIAGNOSIS — G4734 Idiopathic sleep related nonobstructive alveolar hypoventilation: Secondary | ICD-10-CM | POA: Diagnosis not present

## 2024-03-27 ENCOUNTER — Ambulatory Visit

## 2024-03-27 DIAGNOSIS — G4730 Sleep apnea, unspecified: Secondary | ICD-10-CM

## 2024-03-27 NOTE — Procedures (Signed)
   SLEEP STUDY REPORT Patient Information Study Date: 03/17/2024 Patient Name: Justin Galvan Patient ID: [id number] Birth Date: October 10, 1960 Age: 63 Gender: Male BMI: 27.3 (W=174 lb, H=5' 7'') Referring Physician: Lum Louis, NP  TEST DESCRIPTION: Home sleep apnea testing was completed using the WatchPat, a Type 1 device, utilizing peripheral arterial tonometry (PAT), chest movement, actigraphy, pulse oximetry, pulse rate, body position and snore. AHI was calculated with apnea and hypopnea using valid sleep time as the denominator. RDI includes apneas, hypopneas, and RERAs. The data acquired and the scoring of sleep and all associated events were performed in accordance with the recommended standards and specifications as outlined in the AASM Manual for the Scoring of Sleep and Associated Events 2.2.0 (2015).  FINDINGS:  1. Moderate Obstructive Sleep Apnea with AHI 27.2/hr.  2. Moderate Central Sleep Apnea with pAHIc 17.2/hr.  3. Oxygen desaturations as low as 81%.  4. Severe snoring was present. O2 sats were < 88% for 23.7 min.  5. Total sleep time was 8 hrs and 19 min.  6. 20.6% of total sleep time was spent in REM sleep.  7. Normal sleep onset latency at 21 min  8. Prolonged REM sleep onset latency at 165 min.  9. Total awakenings were 9. 10. Arrhythmia detection: None  DIAGNOSIS: Moderate Obstructive Sleep Apnea (G47.33) Moderate Central Sleep Apnea Nocturnal Hypoxemia  RECOMMENDATIONS: 1. Clinical correlation of these findings is necessary. The decision to treat obstructive sleep apnea (OSA) is usually based on the presence of apnea symptoms or the presence of associated medical conditions such as Hypertension, Congestive Heart Failure, Atrial Fibrillation or Obesity. The most common symptoms of OSA are snoring, gasping for breath while sleeping, daytime sleepiness and fatigue. 2. Initiating apnea therapy is recommended given the presence of symptoms and/or  associated conditions. Recommend proceeding with one of the following:  a. Auto-CPAP therapy with a pressure range of 5-20cm H2O.  b. An oral appliance (OA) that can be obtained from certain dentists with expertise in sleep medicine. These are primarily of use in non-obese patients with mild and moderate disease.  c. An ENT consultation which may be useful to look for specific causes of obstruction and possible treatment options.  d. If patient is intolerant to PAP therapy, consider referral to ENT for evaluation for hypoglossal nerve stimulator. 3. Close follow-up is necessary to ensure success with CPAP or oral appliance therapy for maximum benefit . 4. A follow-up oximetry study on CPAP is recommended to assess the adequacy of therapy and determine the need for supplemental oxygen or the potential need for Bi-level therapy. An arterial blood gas to determine the adequacy of baseline ventilation and oxygenation should also be considered. 5. Healthy sleep recommendations include: adequate nightly sleep (normal 7-9 hrs/night), avoidance of caffeine after noon and alcohol near bedtime, and maintaining a sleep environment that is cool, dark and quiet. 6. Weight loss for overweight patients is recommended. Even modest amounts of weight loss can significantly improve the severity of sleep apnea. 7. Snoring recommendations include: weight loss where appropriate, side sleeping, and avoidance of alcohol before bed. 8. Operation of motor vehicle should not be performed when sleepy.  Signature: Wilbert Bihari, MD; [facility]; Diplomat, American Board of Sleep Medicine Electronically Signed: 03/27/2024 3:55:31 PM

## 2024-04-02 ENCOUNTER — Telehealth: Payer: Self-pay | Admitting: *Deleted

## 2024-04-02 DIAGNOSIS — G4733 Obstructive sleep apnea (adult) (pediatric): Secondary | ICD-10-CM

## 2024-04-02 DIAGNOSIS — I25118 Atherosclerotic heart disease of native coronary artery with other forms of angina pectoris: Secondary | ICD-10-CM

## 2024-04-02 DIAGNOSIS — G4734 Idiopathic sleep related nonobstructive alveolar hypoventilation: Secondary | ICD-10-CM

## 2024-04-02 DIAGNOSIS — I5020 Unspecified systolic (congestive) heart failure: Secondary | ICD-10-CM

## 2024-04-02 DIAGNOSIS — G4730 Sleep apnea, unspecified: Secondary | ICD-10-CM

## 2024-04-02 DIAGNOSIS — I10 Essential (primary) hypertension: Secondary | ICD-10-CM

## 2024-04-02 DIAGNOSIS — I25110 Atherosclerotic heart disease of native coronary artery with unstable angina pectoris: Secondary | ICD-10-CM

## 2024-04-02 NOTE — Telephone Encounter (Signed)
 The patient has been notified of the result and verbalized understanding.  All questions (if any) were answered. Joshua Dalton Seip, CMA 04/02/2024 4:37 PM

## 2024-04-02 NOTE — Telephone Encounter (Signed)
-----   Message from Wilbert Bihari, MD sent at 03/27/2024  3:57 PM EST ----- Please let patient know that they have sleep apnea.  Recommend therapeutic CPAP titration for treatment of patient's sleep disordered breathing.

## 2024-05-01 NOTE — Telephone Encounter (Signed)
**Note De-Identified Daquavion Catala Obfuscation** I called [employer] and was advised by the virtual assistant that a PA is not required for CPT Code: 04188. Confirmation #: [id number]  I called the pt but got no answer so I left a message on his VM (ok per DPR) advising him that [facility] does not require a prior authorization for a CPAP Titration and I left the sleep labs phone number in the message so he can call them to be scheduled. I also left the office phone number so he can call me back if he has any questions or concerns.

## 2024-05-04 ENCOUNTER — Other Ambulatory Visit: Payer: Self-pay | Admitting: Internal Medicine

## 2024-05-04 DIAGNOSIS — I10 Essential (primary) hypertension: Secondary | ICD-10-CM

## 2024-05-04 DIAGNOSIS — E119 Type 2 diabetes mellitus without complications: Secondary | ICD-10-CM

## 2024-05-07 ENCOUNTER — Other Ambulatory Visit: Payer: Self-pay | Admitting: Internal Medicine

## 2024-05-07 DIAGNOSIS — E785 Hyperlipidemia, unspecified: Secondary | ICD-10-CM

## 2024-05-07 DIAGNOSIS — E119 Type 2 diabetes mellitus without complications: Secondary | ICD-10-CM

## 2024-05-07 DIAGNOSIS — I2510 Atherosclerotic heart disease of native coronary artery without angina pectoris: Secondary | ICD-10-CM

## 2024-05-08 NOTE — Telephone Encounter (Signed)
 Copied from CRM [id number]. Topic: Clinical - Prescription Issue >> May 08, 2024 10:26 AM Montie POUR wrote: Reason for CRM:  Mr Nordahl is calling about refills for insulin  glargine, 2 Unit Dial, (TOUJEO  MAX SOLOSTAR) 300 UNIT/ML Solostar Pen and indapamide  (LOZOL ) 1.25 MG tablet. The chart shows both medications have not been sent to his pharmacy.  [id number] [place] [place] - [address] [facility] [place] [address] Phone: [phone] Fax: [phone]  Please check on refill and send to his pharmacy.

## 2024-05-10 ENCOUNTER — Other Ambulatory Visit: Payer: Self-pay | Admitting: Physician Assistant

## 2024-05-27 ENCOUNTER — Ambulatory Visit (HOSPITAL_COMMUNITY)
Admission: RE | Admit: 2024-05-27 | Discharge: 2024-05-27 | Disposition: A | Payer: Self-pay | Source: Ambulatory Visit | Attending: Emergency Medicine | Admitting: Emergency Medicine

## 2024-05-27 ENCOUNTER — Other Ambulatory Visit: Payer: Self-pay | Admitting: Emergency Medicine

## 2024-05-27 DIAGNOSIS — Z951 Presence of aortocoronary bypass graft: Secondary | ICD-10-CM

## 2024-05-27 DIAGNOSIS — G4730 Sleep apnea, unspecified: Secondary | ICD-10-CM

## 2024-05-27 DIAGNOSIS — I5020 Unspecified systolic (congestive) heart failure: Secondary | ICD-10-CM

## 2024-05-27 DIAGNOSIS — I10 Essential (primary) hypertension: Secondary | ICD-10-CM

## 2024-05-27 DIAGNOSIS — I25110 Atherosclerotic heart disease of native coronary artery with unstable angina pectoris: Secondary | ICD-10-CM

## 2024-05-27 DIAGNOSIS — E119 Type 2 diabetes mellitus without complications: Secondary | ICD-10-CM

## 2024-05-27 DIAGNOSIS — E785 Hyperlipidemia, unspecified: Secondary | ICD-10-CM

## 2024-05-27 LAB — ECHOCARDIOGRAM LIMITED
Area-P 1/2: 2.84 cm2
S' Lateral: 3.8 cm

## 2024-05-28 ENCOUNTER — Ambulatory Visit: Payer: Self-pay | Admitting: Emergency Medicine

## 2024-06-01 ENCOUNTER — Ambulatory Visit: Payer: Self-pay | Admitting: Emergency Medicine

## 2024-06-02 ENCOUNTER — Ambulatory Visit: Admitting: Internal Medicine

## 2024-07-29 ENCOUNTER — Ambulatory Visit (HOSPITAL_BASED_OUTPATIENT_CLINIC_OR_DEPARTMENT_OTHER): Admitting: Cardiology
